# Patient Record
Sex: Female | Born: 1940
Health system: Southern US, Community
[De-identification: ages and names within clinical notes are randomized; demographics above are authoritative.]

## PROBLEM LIST (undated history)

## (undated) DIAGNOSIS — F331 Major depressive disorder, recurrent, moderate: Secondary | ICD-10-CM

## (undated) DIAGNOSIS — M3390 Dermatopolymyositis, unspecified, organ involvement unspecified: Secondary | ICD-10-CM

## (undated) DIAGNOSIS — M810 Age-related osteoporosis without current pathological fracture: Secondary | ICD-10-CM

## (undated) DIAGNOSIS — K862 Cyst of pancreas: Secondary | ICD-10-CM

## (undated) DIAGNOSIS — K52839 Microscopic colitis, unspecified: Secondary | ICD-10-CM

## (undated) DIAGNOSIS — M1990 Unspecified osteoarthritis, unspecified site: Secondary | ICD-10-CM

## (undated) DIAGNOSIS — N183 Chronic kidney disease, stage 3 unspecified: Secondary | ICD-10-CM

## (undated) DIAGNOSIS — I2510 Atherosclerotic heart disease of native coronary artery without angina pectoris: Secondary | ICD-10-CM

## (undated) DIAGNOSIS — E119 Type 2 diabetes mellitus without complications: Secondary | ICD-10-CM

## (undated) DIAGNOSIS — S0181XA Laceration without foreign body of other part of head, initial encounter: Secondary | ICD-10-CM

## (undated) DIAGNOSIS — R32 Unspecified urinary incontinence: Secondary | ICD-10-CM

## (undated) DIAGNOSIS — G458 Other transient cerebral ischemic attacks and related syndromes: Secondary | ICD-10-CM

## (undated) DIAGNOSIS — R7303 Prediabetes: Secondary | ICD-10-CM

## (undated) DIAGNOSIS — Z8619 Personal history of other infectious and parasitic diseases: Secondary | ICD-10-CM

## (undated) DIAGNOSIS — E785 Hyperlipidemia, unspecified: Secondary | ICD-10-CM

## (undated) DIAGNOSIS — Z8669 Personal history of other diseases of the nervous system and sense organs: Secondary | ICD-10-CM

## (undated) DIAGNOSIS — I6529 Occlusion and stenosis of unspecified carotid artery: Secondary | ICD-10-CM

## (undated) DIAGNOSIS — E039 Hypothyroidism, unspecified: Secondary | ICD-10-CM

## (undated) DIAGNOSIS — M3313 Other dermatomyositis without myopathy: Secondary | ICD-10-CM

## (undated) HISTORY — DX: Personal history of other infectious and parasitic diseases: Z86.19

## (undated) HISTORY — DX: Chronic kidney disease, stage 3 (moderate): N18.3

## (undated) HISTORY — DX: Unspecified urinary incontinence: R32

## (undated) HISTORY — DX: Occlusion and stenosis of unspecified carotid artery: I65.29

## (undated) HISTORY — DX: Atherosclerotic heart disease of native coronary artery without angina pectoris: I25.10

## (undated) HISTORY — DX: Laceration without foreign body of other part of head, initial encounter: S01.81XA

## (undated) HISTORY — DX: Other dermatomyositis without myopathy: M33.13

## (undated) HISTORY — DX: Prediabetes: R73.03

## (undated) HISTORY — PX: TOOTH EXTRACTION: SUR596

## (undated) HISTORY — DX: Cyst of pancreas: K86.2

## (undated) HISTORY — DX: Dermatopolymyositis, unspecified, organ involvement unspecified: M33.90

## (undated) HISTORY — DX: Type 2 diabetes mellitus without complications: E11.9

## (undated) HISTORY — DX: Personal history of other diseases of the nervous system and sense organs: Z86.69

## (undated) HISTORY — DX: Other transient cerebral ischemic attacks and related syndromes: G45.8

## (undated) HISTORY — DX: Major depressive disorder, recurrent, moderate: F33.1

## (undated) HISTORY — DX: Hyperlipidemia, unspecified: E78.5

## (undated) HISTORY — DX: Chronic kidney disease, stage 3 unspecified: N18.30

## (undated) HISTORY — DX: Microscopic colitis, unspecified: K52.839

---

## 1975-04-15 HISTORY — PX: BREAST LUMPECTOMY: SHX2

## 1979-04-15 HISTORY — PX: BACK SURGERY: SHX140

## 1988-12-13 HISTORY — PX: ROTATOR CUFF REPAIR: SHX139

## 1995-04-15 HISTORY — PX: TOTAL ABDOMINAL HYSTERECTOMY W/ BILATERAL SALPINGOOPHORECTOMY: SHX83

## 1995-04-15 HISTORY — PX: APPENDECTOMY: SHX54

## 1999-03-04 ENCOUNTER — Encounter: Admission: RE | Admit: 1999-03-04 | Discharge: 1999-03-04 | Payer: Self-pay | Admitting: *Deleted

## 1999-03-21 ENCOUNTER — Other Ambulatory Visit: Admission: RE | Admit: 1999-03-21 | Discharge: 1999-03-21 | Payer: Self-pay | Admitting: *Deleted

## 2000-03-04 ENCOUNTER — Encounter: Admission: RE | Admit: 2000-03-04 | Discharge: 2000-03-04 | Payer: Self-pay | Admitting: *Deleted

## 2000-03-04 ENCOUNTER — Encounter: Payer: Self-pay | Admitting: *Deleted

## 2000-03-23 ENCOUNTER — Other Ambulatory Visit: Admission: RE | Admit: 2000-03-23 | Discharge: 2000-03-23 | Payer: Self-pay | Admitting: *Deleted

## 2001-03-05 ENCOUNTER — Encounter: Admission: RE | Admit: 2001-03-05 | Discharge: 2001-03-05 | Payer: Self-pay | Admitting: *Deleted

## 2001-03-05 ENCOUNTER — Encounter: Payer: Self-pay | Admitting: *Deleted

## 2001-03-15 ENCOUNTER — Other Ambulatory Visit: Admission: RE | Admit: 2001-03-15 | Discharge: 2001-03-15 | Payer: Self-pay | Admitting: *Deleted

## 2001-04-14 DIAGNOSIS — I2510 Atherosclerotic heart disease of native coronary artery without angina pectoris: Secondary | ICD-10-CM

## 2001-04-14 DIAGNOSIS — I219 Acute myocardial infarction, unspecified: Secondary | ICD-10-CM

## 2001-04-14 HISTORY — DX: Acute myocardial infarction, unspecified: I21.9

## 2001-04-14 HISTORY — PX: CORONARY ARTERY BYPASS GRAFT: SHX141

## 2001-04-14 HISTORY — DX: Atherosclerotic heart disease of native coronary artery without angina pectoris: I25.10

## 2002-03-07 ENCOUNTER — Encounter: Payer: Self-pay | Admitting: *Deleted

## 2002-03-07 ENCOUNTER — Encounter: Admission: RE | Admit: 2002-03-07 | Discharge: 2002-03-07 | Payer: Self-pay | Admitting: *Deleted

## 2002-03-15 ENCOUNTER — Encounter: Payer: Self-pay | Admitting: Thoracic Surgery (Cardiothoracic Vascular Surgery)

## 2002-03-15 ENCOUNTER — Other Ambulatory Visit: Admission: RE | Admit: 2002-03-15 | Discharge: 2002-03-15 | Payer: Self-pay | Admitting: *Deleted

## 2002-03-17 ENCOUNTER — Encounter: Payer: Self-pay | Admitting: Thoracic Surgery (Cardiothoracic Vascular Surgery)

## 2002-03-17 ENCOUNTER — Inpatient Hospital Stay (HOSPITAL_COMMUNITY)
Admission: RE | Admit: 2002-03-17 | Discharge: 2002-03-21 | Payer: Self-pay | Admitting: Thoracic Surgery (Cardiothoracic Vascular Surgery)

## 2002-03-18 ENCOUNTER — Encounter: Payer: Self-pay | Admitting: Thoracic Surgery (Cardiothoracic Vascular Surgery)

## 2002-03-20 ENCOUNTER — Encounter: Payer: Self-pay | Admitting: Thoracic Surgery (Cardiothoracic Vascular Surgery)

## 2002-05-02 ENCOUNTER — Encounter (HOSPITAL_COMMUNITY): Admission: RE | Admit: 2002-05-02 | Discharge: 2002-07-31 | Payer: Self-pay | Admitting: Cardiology

## 2002-05-16 ENCOUNTER — Encounter
Admission: RE | Admit: 2002-05-16 | Discharge: 2002-05-16 | Payer: Self-pay | Admitting: Thoracic Surgery (Cardiothoracic Vascular Surgery)

## 2002-05-16 ENCOUNTER — Encounter: Payer: Self-pay | Admitting: Thoracic Surgery (Cardiothoracic Vascular Surgery)

## 2003-03-21 ENCOUNTER — Other Ambulatory Visit: Admission: RE | Admit: 2003-03-21 | Discharge: 2003-03-21 | Payer: Self-pay | Admitting: *Deleted

## 2003-05-05 ENCOUNTER — Encounter: Admission: RE | Admit: 2003-05-05 | Discharge: 2003-05-05 | Payer: Self-pay | Admitting: Internal Medicine

## 2003-07-25 ENCOUNTER — Emergency Department (HOSPITAL_COMMUNITY): Admission: EM | Admit: 2003-07-25 | Discharge: 2003-07-25 | Payer: Self-pay | Admitting: Emergency Medicine

## 2004-03-21 ENCOUNTER — Other Ambulatory Visit: Admission: RE | Admit: 2004-03-21 | Discharge: 2004-03-21 | Payer: Self-pay | Admitting: *Deleted

## 2004-12-03 ENCOUNTER — Encounter: Admission: RE | Admit: 2004-12-03 | Discharge: 2004-12-03 | Payer: Self-pay | Admitting: Orthopedic Surgery

## 2005-03-18 ENCOUNTER — Other Ambulatory Visit: Admission: RE | Admit: 2005-03-18 | Discharge: 2005-03-18 | Payer: Self-pay | Admitting: *Deleted

## 2006-03-25 ENCOUNTER — Other Ambulatory Visit: Admission: RE | Admit: 2006-03-25 | Discharge: 2006-03-25 | Payer: Self-pay | Admitting: *Deleted

## 2006-05-26 ENCOUNTER — Emergency Department (HOSPITAL_COMMUNITY): Admission: EM | Admit: 2006-05-26 | Discharge: 2006-05-26 | Payer: Self-pay | Admitting: Emergency Medicine

## 2007-03-25 ENCOUNTER — Emergency Department (HOSPITAL_COMMUNITY): Admission: EM | Admit: 2007-03-25 | Discharge: 2007-03-26 | Payer: Self-pay | Admitting: Emergency Medicine

## 2007-12-22 ENCOUNTER — Other Ambulatory Visit: Admission: RE | Admit: 2007-12-22 | Discharge: 2007-12-22 | Payer: Self-pay | Admitting: Gynecology

## 2009-05-02 ENCOUNTER — Ambulatory Visit (HOSPITAL_COMMUNITY): Admission: RE | Admit: 2009-05-02 | Discharge: 2009-05-02 | Payer: Self-pay | Admitting: Rheumatology

## 2009-05-04 ENCOUNTER — Ambulatory Visit (HOSPITAL_COMMUNITY): Admission: RE | Admit: 2009-05-04 | Discharge: 2009-05-04 | Payer: Self-pay | Admitting: Rheumatology

## 2009-05-10 ENCOUNTER — Ambulatory Visit: Payer: Self-pay | Admitting: Oncology

## 2009-05-11 LAB — COMPREHENSIVE METABOLIC PANEL
ALT: 30 U/L (ref 0–35)
AST: 37 U/L (ref 0–37)
Albumin: 3.7 g/dL (ref 3.5–5.2)
Alkaline Phosphatase: 75 U/L (ref 39–117)
BUN: 9 mg/dL (ref 6–23)
CO2: 27 mEq/L (ref 19–32)
Creatinine, Ser: 1.02 mg/dL (ref 0.40–1.20)
Total Protein: 6.3 g/dL (ref 6.0–8.3)

## 2009-05-11 LAB — LACTATE DEHYDROGENASE: LDH: 161 U/L (ref 94–250)

## 2009-05-11 LAB — CBC WITH DIFFERENTIAL/PLATELET
Basophils Absolute: 0 10*3/uL (ref 0.0–0.1)
EOS%: 2 % (ref 0.0–7.0)
HCT: 38.2 % (ref 34.8–46.6)
MCH: 31.7 pg (ref 25.1–34.0)
MCHC: 34.2 g/dL (ref 31.5–36.0)
MCV: 92.7 fL (ref 79.5–101.0)
MONO#: 0.5 10*3/uL (ref 0.1–0.9)
MONO%: 9.7 % (ref 0.0–14.0)
NEUT%: 54.3 % (ref 38.4–76.8)
Platelets: 168 10*3/uL (ref 145–400)
RBC: 4.12 10*6/uL (ref 3.70–5.45)

## 2009-05-11 LAB — CEA: CEA: 1.1 ng/mL (ref 0.0–5.0)

## 2009-05-11 LAB — CANCER ANTIGEN 19-9: CA 19-9: 10.4 U/mL (ref <35.0)

## 2009-05-23 ENCOUNTER — Encounter: Admission: RE | Admit: 2009-05-23 | Discharge: 2009-05-23 | Payer: Self-pay | Admitting: Oncology

## 2009-08-29 ENCOUNTER — Ambulatory Visit: Payer: Self-pay | Admitting: Oncology

## 2009-08-30 ENCOUNTER — Ambulatory Visit (HOSPITAL_COMMUNITY): Admission: RE | Admit: 2009-08-30 | Discharge: 2009-08-30 | Payer: Self-pay | Admitting: Oncology

## 2009-08-30 LAB — COMPREHENSIVE METABOLIC PANEL
AST: 26 U/L (ref 0–37)
Albumin: 3.9 g/dL (ref 3.5–5.2)
BUN: 11 mg/dL (ref 6–23)
Calcium: 9.4 mg/dL (ref 8.4–10.5)
Creatinine, Ser: 1.03 mg/dL (ref 0.40–1.20)
Sodium: 136 mEq/L (ref 135–145)

## 2010-02-27 ENCOUNTER — Ambulatory Visit: Payer: Self-pay | Admitting: Oncology

## 2010-03-01 LAB — COMPREHENSIVE METABOLIC PANEL
ALT: 15 U/L (ref 0–35)
Albumin: 4.2 g/dL (ref 3.5–5.2)
Alkaline Phosphatase: 77 U/L (ref 39–117)
BUN: 13 mg/dL (ref 6–23)
Chloride: 104 mEq/L (ref 96–112)
Glucose, Bld: 88 mg/dL (ref 70–99)
Sodium: 136 mEq/L (ref 135–145)
Total Bilirubin: 0.6 mg/dL (ref 0.3–1.2)
Total Protein: 6.6 g/dL (ref 6.0–8.3)

## 2010-03-01 LAB — CBC WITH DIFFERENTIAL/PLATELET
Basophils Absolute: 0 10*3/uL (ref 0.0–0.1)
EOS%: 2.8 % (ref 0.0–7.0)
Eosinophils Absolute: 0.2 10*3/uL (ref 0.0–0.5)
HCT: 37.9 % (ref 34.8–46.6)
MCHC: 33.8 g/dL (ref 31.5–36.0)
MCV: 92.4 fL (ref 79.5–101.0)
Platelets: 203 10*3/uL (ref 145–400)
RDW: 13.1 % (ref 11.2–14.5)
WBC: 5.5 10*3/uL (ref 3.9–10.3)
lymph#: 1.9 10*3/uL (ref 0.9–3.3)

## 2010-05-04 ENCOUNTER — Other Ambulatory Visit (HOSPITAL_COMMUNITY): Payer: Self-pay | Admitting: Oncology

## 2010-05-04 DIAGNOSIS — K869 Disease of pancreas, unspecified: Secondary | ICD-10-CM

## 2010-08-26 ENCOUNTER — Other Ambulatory Visit (HOSPITAL_COMMUNITY): Payer: Self-pay | Admitting: Oncology

## 2010-08-26 ENCOUNTER — Encounter (HOSPITAL_BASED_OUTPATIENT_CLINIC_OR_DEPARTMENT_OTHER): Payer: Medicare Other | Admitting: Oncology

## 2010-08-26 ENCOUNTER — Ambulatory Visit (HOSPITAL_COMMUNITY)
Admission: RE | Admit: 2010-08-26 | Discharge: 2010-08-26 | Disposition: A | Discharge status: Home / Self Care | Payer: Medicare Other | Source: Ambulatory Visit | Attending: Oncology | Admitting: Oncology

## 2010-08-26 DIAGNOSIS — K869 Disease of pancreas, unspecified: Secondary | ICD-10-CM

## 2010-08-26 DIAGNOSIS — Z9089 Acquired absence of other organs: Secondary | ICD-10-CM | POA: Insufficient documentation

## 2010-08-26 DIAGNOSIS — K8689 Other specified diseases of pancreas: Secondary | ICD-10-CM | POA: Insufficient documentation

## 2010-08-26 DIAGNOSIS — Z90710 Acquired absence of both cervix and uterus: Secondary | ICD-10-CM | POA: Insufficient documentation

## 2010-08-26 DIAGNOSIS — B358 Other dermatophytoses: Secondary | ICD-10-CM

## 2010-08-26 LAB — CBC WITH DIFFERENTIAL/PLATELET
BASO%: 0.5 % (ref 0.0–2.0)
Basophils Absolute: 0 10*3/uL (ref 0.0–0.1)
LYMPH%: 26.3 % (ref 14.0–49.7)
MONO%: 7.8 % (ref 0.0–14.0)
NEUT#: 3.6 10*3/uL (ref 1.5–6.5)
NEUT%: 61.8 % (ref 38.4–76.8)
RDW: 14.2 % (ref 11.2–14.5)
lymph#: 1.5 10*3/uL (ref 0.9–3.3)

## 2010-08-26 LAB — CMP (CANCER CENTER ONLY)
ALT(SGPT): 35 U/L (ref 10–47)
AST: 49 U/L — ABNORMAL HIGH (ref 11–38)
Albumin: 4 g/dL (ref 3.3–5.5)
Calcium: 9.3 mg/dL (ref 8.0–10.3)
Creat: 1 mg/dl (ref 0.6–1.2)
Glucose, Bld: 129 mg/dL — ABNORMAL HIGH (ref 73–118)
Potassium: 5.4 mEq/L — ABNORMAL HIGH (ref 3.3–4.7)
Total Protein: 6.6 g/dL (ref 6.4–8.1)

## 2010-08-26 LAB — LACTATE DEHYDROGENASE: LDH: 209 U/L (ref 94–250)

## 2010-08-26 MED ORDER — IOHEXOL 300 MG/ML  SOLN
125.0000 mL | Freq: Once | INTRAMUSCULAR | Status: AC | PRN
  Administered 2010-08-26: 125 mL via INTRAVENOUS

## 2010-08-30 ENCOUNTER — Encounter (HOSPITAL_BASED_OUTPATIENT_CLINIC_OR_DEPARTMENT_OTHER): Payer: Medicare Other | Admitting: Oncology

## 2010-08-30 DIAGNOSIS — B358 Other dermatophytoses: Secondary | ICD-10-CM

## 2010-08-30 DIAGNOSIS — K869 Disease of pancreas, unspecified: Secondary | ICD-10-CM

## 2010-08-30 NOTE — Op Note (Signed)
Kathleen Williamson, Kathleen Williamson                          ACCOUNT NO.:  [id number]   MEDICAL RECORD NO.:  [id number]                   PATIENT TYPE:  INP   LOCATION:  2301                                 FACILITY:  [facility]   PHYSICIAN:  Salvatore Decent. Cornelius Moras, M.D.              DATE OF BIRTH:  12-05-1940   DATE OF PROCEDURE:  03/17/2002  DATE OF DISCHARGE:                                 OPERATIVE REPORT   PREOPERATIVE DIAGNOSIS:  Severe three-vessel coronary artery disease with  new onset angina.   POSTOPERATIVE DIAGNOSIS:  Severe three-vessel coronary artery disease with  new onset angina.   OPERATION PERFORMED:  Median sternotomy for coronary artery bypass grafting  times five (left internal mammary artery to distal left anterior descending  coronary artery, free right internal mammary artery to posterior descending  coronary artery, saphenous vein graft to first diagonal branch, saphenous  vein graft to second circumflex marginal branch and sequential saphenous  vein graft to fit circumflex marginal branch).   SURGEON:  Salvatore Decent. Cornelius Moras, M.D.   ASSISTANT:  1. Kerin Perna, M.D.  2. Levin Erp. Steward, P.A.   ANESTHESIA:  General.   INDICATIONS FOR PROCEDURE:  The patient is a 70 year old female with no  previous cardiac history but risk factors including history of  hyperlipidemia.  The patient is followed by Dr. Benjaman Kindler and referred by  Dr. Corliss Marcus for management of coronary artery disease.  The patient  presents with new onset symptoms of angina.  Catheterization performed by  Dr. Amil Amen demonstrates severe two to three-vessel coronary artery disease  with high grade stenosis of the left anterior descending coronary artery not  amenable to percutaneous based therapy.  Full consultation note has been  dictated previously.   OPERATIVE CONSENT:  The patient and her husband have been counseled a length  regarding the indications and potential benefits of coronary artery bypass  grafting.  Alternative treatment strategies have been discussed.  They  understand and accept all associated risks of surgery including but not  limited to risks of death, stroke, myocardial infarction, respiratory  failure, bleeding requiring blood transfusion, arrhythmia, infection,  recurrent coronary artery disease.  All of their questions have been  addressed.   DESCRIPTION OF PROCEDURE:  The patient was brought to the operating room on  the above mentioned date and identified and placed in supine position on the  operating table.  Central monitoring was established by the anesthesia  service under the care and direction of Dr. Kipp Brood.  The intravenous  antibiotics were administered.  Following induction of general endotracheal  anesthesia, a Foley catheter was placed.  The patient's chest, abdomen, both  groins, and both lower extremities were prepped and draped in a sterile  manner.   A median sternotomy incision was performed and the left internal mammary  artery was dissected from the chest wall and prepared for bypass grafting.  The left internal mammary artery was somewhat small caliber but otherwise  excellent quality with excellent forward flow.  Subsequently, the right  internal mammary artery was also dissected from the chest wall and prepared  for bypass grafting.  This vessel was similarly a good quality conduit  although relatively small caliber.  Simultaneously, saphenous vein was  obtained from the patients left thigh using endoscopic vein harvest  technique.  An initial incision was made in the right thigh just above the  knee, but the saphenous vein at this level appears somewhat small.  Following this, an incision was made in the left knee and the vein seemed to  be of better size.  This vein was harvested using endoscopic technique.  The  patient was heparinized systemically.   The pericardium was opened.  The ascending aorta was normal in appearance.  The  ascending aorta and the right atrium were cannulated for cardiopulmonary  bypass.  Adequate heparinization was verified.  Cardiopulmonary bypass was  begun and the surface of the heart was inspected.  Distal sites were  selected for coronary artery bypass grafting.  Of note, the right internal  mammary artery will not reach to the level necessary for grafting of the  proximal portion of the posterior descending coronary artery as an in situ  graft.  Therefore, the right internal mammary artery was transected  proximally near its takeoff from the subclavian artery and the proximal  stump was doubly oversewn with suture ligatures.  Portions of saphenous vein  and the left internal mammary artery are also trimmed to appropriate length  for bypass grafting.  A temperature probe was placed in the left ventricular  septum and a cardioplegia catheter was placed in the ascending aorta.   The patient was cooled to 30 degrees systemic temperature.  The aortic  crossclamp was applied and cardioplegia was delivered initially in an  antegrade fashion through the aortic root.  Iced saline slush was applied  for topical hypothermia.  The initial cardioplegic arrest and myocardial  cooling were notably excellent.  Repeat doses of cardioplegia were  administered intermittently throughout the crossclamp portion of the  operation, both through the aortic root and down a subsequently vein graft  to maintain septal temperature below 15 degrees centigrade.  The following  distal coronary anastomoses were performed:  (1)  The second circumflex  marginal branch was grafted with a saphenous vein graft in a side-to-side  fashion.  This coronary measures 1.5 mm in diameter and is of good quality.  (2)  The fifth circumflex marginal branch of the distal left circumflex  artery was grafted using a sequential saphenous vein graft following the  vein placed to the second circumflex marginal branch.  This  coronary measures  1.1 mm in diameter at the site of distal bypass and is of good quality.  (3)  The first diagonal branch off the left anterior descending coronary artery  was grafted with a saphenous vein graft in an end-to-side fashion.  This  coronary measures 1.4 mm in diameter and is of good quality at the site of  distal bypass.  (4)  The posterior descending coronary artery is grafted  with the right internal mammary artery as a free graft in an end-to-side  fashion.  This coronary measures 1.3 mm in diameter at the site of distal  bypass and is of good quality.  (5)  The distal left anterior descending  coronary artery was grafted with the left internal mammary artery  in end-to-  side fashion.  This coronary measures 1.5 mm at the site of distal bypass  and is of good quality.  There is diffuse plaque in the vessel beginning  immediately proximal to the level of the distal anastomosis.  The vessel was  of good quality at the site of distal anastomosis.  Free proximal  anastomoses are performed directly to the ascending aorta prior to removal  of the aortic crossclamp.  The proximal anastomosis for the free right  internal mammary artery graft was constructed using a very short segment of  interposition saphenous vein due to mismatch in caliber between the proximal  end of the right internal mammary artery and the ascending aorta itself.  The septal temperature was noted to rise rapidly and dramatically upon  reperfusion of the left internal mammary artery.  The aortic crossclamp was  removed after a total crossclamp time of 82 minutes.   The heart began to beat spontaneously without need for cardioversion.  The  proximal end of the free right internal mammary artery graft is now  constructed in an end-to-end fashion to the short segment of saphenous vein  as described previously.  This is performed with a 7-0 suture.  All proximal  and distal anastomoses were inspected for  hemostasis and appropriate graft  orientation.  Epicardial pacing wires were fixed to the right ventricular  outflow tract and to the right atrial appendage.  The patient was rewarmed  to greater than 37 degrees centigrade temperature.  The patient was weaned  from cardiopulmonary bypass without difficulty.  The patient's rhythm at  separation from bypass was normal sinus rhythm.  No inotropic support was  required.  Total cardiopulmonary bypass time for the operation was 119  minutes.   The venous and arterial cannulae were removed uneventfully.  Protamine was  administered to reverse the anticoagulation.  The mediastinum and both left  and right pleural spaces were irrigated with saline solution and inspected  for hemostasis.  The mediastinum and both left and right pleural spaces were  then drained using four chest tubes placed through separate stab incisions  inferiorly.  The median sternotomy was closed in routine fashion.  Both lower extremity incisions were closed in multiple layers in routine fashion.  All skin incisions were closed with subcuticular skin closures.   The patient tolerated the procedure well and was transported to the surgical  intensive care unit in stable condition.  There were no intraoperative  complications.  All sponge and instrument counts were verified correct at  completion of the operation.  No blood products were administered.                                                Salvatore Decent. Cornelius Moras, M.D.    CHO/MEDQ  D:  03/17/2002  T:  03/17/2002  Job:  [id number]   cc:   Francisca December, M.D.  [address]  [address]  [place]  [place] [address]  Fax: [phone]   Theressa Millard, M.D.  [address] [place]  [place] [address]  Fax: [phone]

## 2010-08-30 NOTE — Discharge Summary (Signed)
Kathleen Williamson, Kathleen Williamson                          ACCOUNT NO.:  [id number]   MEDICAL RECORD NO.:  [id number]                   PATIENT TYPE:  INP   LOCATION:  2011                                 FACILITY:  [facility]   PHYSICIAN:  Salvatore Decent. Cornelius Moras, M.D.              DATE OF BIRTH:  1940-09-28   DATE OF ADMISSION:  03/17/2002  DATE OF DISCHARGE:  03/21/2002                                 DISCHARGE SUMMARY   PRIMARY ADMISSION DIAGNOSES:  1. Severe three-vessel coronary artery disease.  2. New-onset angina.   ADDITIONAL DIAGNOSES/DISCHARGE DIAGNOSES:  1. Severe three-vessel coronary artery disease.  2. New-onset angina.  3. Hyperlipidemia.  4. Hypothyroidism.  5. Depression.   PROCEDURES:  1. Coronary artery bypass grafting x 5 (left internal mammary artery to the     LAD, free right internal mammary artery to the posterior descending,     saphenous vein graft to the diagonal, sequential saphenous vein graft to     the second and fifth circumflex marginals).  2. Endoscopic vein harvest left thigh.   HISTORY OF PRESENT ILLNESS:  The patient is a 70 year old white female with  a previous history of coronary artery disease.  Over the past 1-1/2 to two  weeks she has been experiencing intermittent chest pressure with exertion  associated with palpitations and radiation to her neck and shoulder.  She  was seen by Dr. Amil Amen and underwent an exercise treadmill test which were  notable for anginal type symptoms relieved with rest and correlating with  EKG changes.  She underwent elective cardiac catheterization on March 08, 2002, and was found to have severe two- to three-vessel coronary artery  disease which was not felt to be amenable to percutaneous intervention.  She  was referred to Dr. Tressie Stalker for cardiac surgical evaluation and was  felt to be a good candidate for surgical revascularization.   HOSPITAL COURSE:  She was admitted to [facility] on March 17, 2002, and taken to the operating room where she underwent CABG x 5 with the  above-noted grafts.  She tolerated the procedure well and was transferred to  the SICU in stable condition.  She was extubated shortly after surgery.  She  was hemodynamically stable and doing well on postoperative day #1.  At that  time she was able to be transferred to the floor.  Her mediastinal chest  tubes were left in place due to increased drainage, and by postoperative day  #3 drainage had minimized significantly.  At this time the tubes were  discontinued.  Otherwise, she has done well postoperatively.  She has been  mildly anemic, but her hemoglobin and hematocrit have remained stable,  around 9.8 and 29.4 respectively.  Her other laboratories have also remained  stable.  She has been diuresed back down to a few pounds above her  preoperative weight.  She is ambulating in the halls  without difficulty and  has been weaned off supplemental oxygen, maintaining O2 saturations of  around 96-98% on room air.  Her incisions are all healing well.  It is felt  that if she continues to remain stable she will be ready for discharge home  on March 21, 2002.   DISCHARGE MEDICATIONS:  1. Enteric-coated aspirin 325 mg q.d.  2. Protonix 40 mg q.d.  3. Toprol XL 25 mg q.d.  4. Ultram 50-100 mg q.4h. p.r.n. for pain.  5. Zoloft 100 mg q.d.  6. Synthroid 50 mcg q.d.   DISCHARGE INSTRUCTIONS:  She is to refrain from driving, heavy lifting, or  straining with activity.  She may continue daily walking and use of  incentive spirometer.  She is asked to continue a low-fat, low-salt diet.  She may shower daily and clean her incisions with soap and water.   DISCHARGE FOLLOW-UP:  She will see Dr. Amil Amen in the office in two weeks  and have a chest x-ray at that time.  She will then follow up with Dr. Cornelius Moras  in three weeks, and she will bring her chest x-ray to this appointment.     Coral Ceo, P.A.                         Salvatore Decent. Cornelius Moras, M.D.    GC/MEDQ  D:  03/20/2002  T:  03/21/2002  Job:  [id number]   cc:   Francisca December, M.D.  [address]  [address]  [place]  [place] [address]  Fax: [phone]   Theressa Millard, M.D.  [address] [place]  [place] [address]  Fax: [phone]

## 2011-04-11 ENCOUNTER — Telehealth: Payer: Self-pay | Admitting: Oncology

## 2011-04-11 ENCOUNTER — Other Ambulatory Visit (HOSPITAL_COMMUNITY): Payer: Self-pay | Admitting: Oncology

## 2011-04-11 DIAGNOSIS — C259 Malignant neoplasm of pancreas, unspecified: Secondary | ICD-10-CM

## 2011-04-11 NOTE — Telephone Encounter (Signed)
lmonvm adviising the pt of her lab and ct scan appt along with the instructions and the appt with the md. Pt is aware to pick up the oral contrast prior to the appt.

## 2011-08-08 ENCOUNTER — Telehealth: Payer: Self-pay | Admitting: Oncology

## 2011-08-08 NOTE — Telephone Encounter (Signed)
Changed time of 5/17 appt to 12 pm. S/w pt she is aware.

## 2011-08-26 ENCOUNTER — Ambulatory Visit (HOSPITAL_COMMUNITY)
Admission: RE | Admit: 2011-08-26 | Discharge: 2011-08-26 | Disposition: A | Discharge status: Home / Self Care | Payer: Medicare Other | Source: Ambulatory Visit | Attending: Oncology | Admitting: Oncology

## 2011-08-26 ENCOUNTER — Other Ambulatory Visit (HOSPITAL_BASED_OUTPATIENT_CLINIC_OR_DEPARTMENT_OTHER): Payer: Medicare Other | Admitting: Lab

## 2011-08-26 DIAGNOSIS — C259 Malignant neoplasm of pancreas, unspecified: Secondary | ICD-10-CM

## 2011-08-26 DIAGNOSIS — I700 Atherosclerosis of aorta: Secondary | ICD-10-CM | POA: Insufficient documentation

## 2011-08-26 DIAGNOSIS — K862 Cyst of pancreas: Secondary | ICD-10-CM | POA: Insufficient documentation

## 2011-08-26 DIAGNOSIS — K863 Pseudocyst of pancreas: Secondary | ICD-10-CM | POA: Insufficient documentation

## 2011-08-26 DIAGNOSIS — K5710 Diverticulosis of small intestine without perforation or abscess without bleeding: Secondary | ICD-10-CM | POA: Insufficient documentation

## 2011-08-26 DIAGNOSIS — K869 Disease of pancreas, unspecified: Secondary | ICD-10-CM

## 2011-08-26 LAB — CMP (CANCER CENTER ONLY)
Albumin: 3.8 g/dL (ref 3.3–5.5)
BUN, Bld: 10 mg/dL (ref 7–22)
CO2: 26 mEq/L (ref 18–33)
Calcium: 8.9 mg/dL (ref 8.0–10.3)
Chloride: 98 mEq/L (ref 98–108)
Glucose, Bld: 113 mg/dL (ref 73–118)
Potassium: 4.4 mEq/L (ref 3.3–4.7)
Sodium: 139 mEq/L (ref 128–145)
Total Bilirubin: 0.7 mg/dl (ref 0.20–1.60)
Total Protein: 6.8 g/dL (ref 6.4–8.1)

## 2011-08-26 LAB — CBC WITH DIFFERENTIAL/PLATELET
HGB: 12 g/dL (ref 11.6–15.9)
MONO%: 6 % (ref 0.0–14.0)
NEUT#: 3.9 10*3/uL (ref 1.5–6.5)
Platelets: 165 10*3/uL (ref 145–400)
WBC: 6.4 10*3/uL (ref 3.9–10.3)

## 2011-08-26 LAB — LACTATE DEHYDROGENASE: LDH: 194 U/L (ref 94–250)

## 2011-08-26 MED ORDER — IOHEXOL 300 MG/ML  SOLN
100.0000 mL | Freq: Once | INTRAMUSCULAR | Status: AC | PRN
  Administered 2011-08-26: 100 mL via INTRAVENOUS

## 2011-08-26 NOTE — Progress Notes (Signed)
Quick Note:  Please notify patient and call/fax these results to patient's doctors. ______ 

## 2011-08-28 ENCOUNTER — Telehealth: Payer: Self-pay | Admitting: Medical Oncology

## 2011-08-28 NOTE — Telephone Encounter (Signed)
I called pt per Dr. Arline Asp with CT results from 08/26/11

## 2011-08-29 ENCOUNTER — Encounter: Payer: Self-pay | Admitting: Oncology

## 2011-08-29 ENCOUNTER — Ambulatory Visit (HOSPITAL_BASED_OUTPATIENT_CLINIC_OR_DEPARTMENT_OTHER): Payer: Medicare Other | Admitting: Oncology

## 2011-08-29 VITALS — BP 116/60 | HR 59 | Temp 97.1°F | Ht 64.0 in | Wt 131.9 lb

## 2011-08-29 DIAGNOSIS — K863 Pseudocyst of pancreas: Secondary | ICD-10-CM | POA: Insufficient documentation

## 2011-08-29 DIAGNOSIS — R932 Abnormal findings on diagnostic imaging of liver and biliary tract: Secondary | ICD-10-CM

## 2011-08-29 DIAGNOSIS — I2510 Atherosclerotic heart disease of native coronary artery without angina pectoris: Secondary | ICD-10-CM

## 2011-08-29 NOTE — Progress Notes (Signed)
CC:   Pollyann Savoy, M.D. Theressa Millard, M.D. Daniel B. Yetta Barre, M.D.  PROBLEM LIST: 1. Dermatomyositis with symptoms originating in the spring of 2010,     and skin biopsy carried out in November of 2010.  Apparently this     problem has resolved. 2. Abnormal CT scan of pancreas showing small cystic lesions in the     pancreatic head felt to be benign on serial observation. 3. Coronary artery disease status post coronary artery bypass graft x5     in December 2003. 4. Hypothyroidism. 5. Dyslipidemia. 6. Arthritis involving the fingers. 7. History of depression.  MEDICATIONS:  Medicines are listed in epic and are reviewed and recorded. 1. Prescription medicines include levothyroxine. 2. Toprol-XL. 3. Niaspan. 4. Pravastatin. 5. Zoloft. 6. Restasis. 7. The patient is also on aspirin 325 mg daily.  HISTORY:  Kathleen Williamson was seen today for followup of her history of dermatomyositis and an abnormal CT and MRI of the pancreas which showed a couple of cystic lesions in the pancreatic head felt to be pseudocysts.  Kathleen Williamson is here today with her husband, Annette Stable.  She was last seen by [name] a year ago.  Over the past year she apparently has been having intermittent loose stools which occur up to 4 times a day.  Sometimes stools are formed.  The patient may take 2-4 Imodium per week.  She also has some nausea and occasional vomiting, less than 1 time per month. She denies any abdominal pain or sense of ill health.  Energy is good. No blood in the stools.  She has however lost about 9 or 10 pounds in weight over the past year.  Appetite appears to be fairly good.  The patient tells me that her dermatomyositis apparently has resolved.  PHYSICAL EXAMINATION:  The patient looks generally well.  Weight is 132 pounds, height 5 feet 4 inches, body surface area 1.64 meters squared. Blood pressure 116/50.  Other vital signs are normal.  There is no scleral icterus.  Mouth and pharynx are  benign.  No peripheral adenopathy palpable.  Heart and lungs are normal.  She has had cardiac surgery with a well-healed scar.  Abdomen is benign with no organomegaly or masses palpable.  Extremities no peripheral edema or clubbing.  No obvious rashes.  Neurologic exam is normal.  LABORATORY DATA:  From 08/26/2011 white count 6.4, ANC 3.9, hemoglobin 12.0, hematocrit 36.2, platelets 165,000.  Chemistries from 08/26/2011 notable for an AST of 48.  On 08/26/2010 AST had been 49.  However, on 03/01/2010 AST had been 22.  Rest of the chemistries are normal.  IMAGING STUDIES: 1. CT scan of abdomen and pelvis with IV contrast on 05/04/2009 showed     a small unilocular cyst within the head of the pancreas felt to be     a benign finding, possibly related to prior pancreatitis.  There     were no acute abdominal or pelvic findings.  MRI was suggested. 2. MRI of the abdomen with and without IV contrast from 05/23/2009     showed a 6 x 8 mm cyst in the pancreatic head, again felt to be a     benign lesion.  Possibility of a small side branch intraductal     papillary mucinous tumor was raised as another possible etiology     for this finding. 3. CT scan of abdomen with and without IV contrast from 08/30/2009     showed similar appearance of 2 cystic lesions in  the pancreas felt     to be pseudocysts.  There was no acute abdominal process. 4. CT scan of abdomen and pelvis with and without IV contrast from     08/26/2010 showed stable appearance of the cystic lesions within     the pancreas, likely pseudocysts. 5. CT scan of abdomen and pelvis with and without IV contrast from     08/26/2011 again showed stable appearance of these cysts.  IMPRESSION AND PLAN:  At this point I think we can be fairly confident that the cystic lesions in the pancreas are benign.  Of some concern is the patient's weight loss, intermittent diarrhea and occasional nausea and vomiting.  Perhaps she has irritable bowel  syndrome.  If she continues to lose weight or her symptoms worsen she may benefit from a GI evaluation.  From my standpoint, I do not think the patient needs ongoing followup. I would certainly be happy to see her again should any questions or concerns arise in the future.    ______________________________ Samul Dada, M.D. DSM/MEDQ  D:  08/29/2011  T:  08/29/2011  Job:  [id number]

## 2011-08-29 NOTE — Progress Notes (Signed)
This office note has been dictated.  #[id number]

## 2012-02-18 ENCOUNTER — Other Ambulatory Visit (HOSPITAL_COMMUNITY): Payer: Self-pay | Admitting: Chiropractic Medicine

## 2012-02-18 ENCOUNTER — Ambulatory Visit (HOSPITAL_COMMUNITY)
Admission: RE | Admit: 2012-02-18 | Discharge: 2012-02-18 | Disposition: A | Discharge status: Home / Self Care | Payer: Medicare Other | Source: Ambulatory Visit | Attending: Chiropractic Medicine | Admitting: Chiropractic Medicine

## 2012-02-18 DIAGNOSIS — M47812 Spondylosis without myelopathy or radiculopathy, cervical region: Secondary | ICD-10-CM | POA: Insufficient documentation

## 2012-02-18 DIAGNOSIS — M542 Cervicalgia: Secondary | ICD-10-CM

## 2012-04-14 DIAGNOSIS — R7303 Prediabetes: Secondary | ICD-10-CM

## 2012-04-14 HISTORY — DX: Prediabetes: R73.03

## 2013-01-27 ENCOUNTER — Telehealth: Payer: Self-pay

## 2013-01-27 MED ORDER — NIACIN ER (ANTIHYPERLIPIDEMIC) 1000 MG PO TBCR: 2000.0000 mg | EXTENDED_RELEASE_TABLET | Freq: Every day | ORAL | Status: DC

## 2013-01-27 NOTE — Telephone Encounter (Signed)
Rx Refill

## 2013-05-09 ENCOUNTER — Other Ambulatory Visit: Payer: Self-pay | Admitting: Internal Medicine

## 2013-05-09 DIAGNOSIS — I779 Disorder of arteries and arterioles, unspecified: Secondary | ICD-10-CM

## 2013-05-10 ENCOUNTER — Ambulatory Visit
Admission: RE | Admit: 2013-05-10 | Discharge: 2013-05-10 | Disposition: A | Discharge status: Home / Self Care | Payer: Medicare Other | Source: Ambulatory Visit | Attending: Internal Medicine | Admitting: Internal Medicine

## 2013-05-10 DIAGNOSIS — I779 Disorder of arteries and arterioles, unspecified: Secondary | ICD-10-CM

## 2013-05-12 ENCOUNTER — Other Ambulatory Visit: Payer: Self-pay | Admitting: Internal Medicine

## 2013-05-12 DIAGNOSIS — G458 Other transient cerebral ischemic attacks and related syndromes: Secondary | ICD-10-CM

## 2013-05-17 ENCOUNTER — Ambulatory Visit
Admission: RE | Admit: 2013-05-17 | Discharge: 2013-05-17 | Disposition: A | Discharge status: Home / Self Care | Payer: Medicare Other | Source: Ambulatory Visit | Attending: Internal Medicine | Admitting: Internal Medicine

## 2013-05-17 DIAGNOSIS — G458 Other transient cerebral ischemic attacks and related syndromes: Secondary | ICD-10-CM

## 2013-05-17 MED ORDER — IOHEXOL 350 MG/ML SOLN
80.0000 mL | Freq: Once | INTRAVENOUS | Status: AC | PRN
  Administered 2013-05-17: 80 mL via INTRAVENOUS

## 2013-08-01 ENCOUNTER — Telehealth: Payer: Self-pay | Admitting: *Deleted

## 2013-08-01 NOTE — Telephone Encounter (Signed)
Patient requests pravastatin refill be sent to [facility]. She is out so she would like a 10 day supply [facility] [address] in [place].

## 2013-08-02 ENCOUNTER — Telehealth: Payer: Self-pay | Admitting: Cardiology

## 2013-08-02 MED ORDER — PRAVASTATIN SODIUM 80 MG PO TABS: 80.0000 mg | ORAL_TABLET | Freq: Every day | ORAL | Status: DC

## 2013-08-02 NOTE — Telephone Encounter (Signed)
Refill Pravastatin

## 2013-08-02 NOTE — Telephone Encounter (Signed)
Refill granted 

## 2013-10-06 ENCOUNTER — Encounter: Payer: Self-pay | Admitting: Cardiology

## 2013-11-02 ENCOUNTER — Ambulatory Visit: Payer: Medicare Other | Admitting: Cardiology

## 2013-11-28 ENCOUNTER — Other Ambulatory Visit: Payer: Self-pay | Admitting: *Deleted

## 2013-11-28 MED ORDER — NIACIN ER (ANTIHYPERLIPIDEMIC) 1000 MG PO TBCR: 2000.0000 mg | EXTENDED_RELEASE_TABLET | Freq: Every day | ORAL | Status: DC

## 2013-12-07 ENCOUNTER — Ambulatory Visit: Payer: Medicare Other | Admitting: Cardiology

## 2013-12-13 ENCOUNTER — Encounter: Payer: Self-pay | Admitting: Cardiology

## 2013-12-13 ENCOUNTER — Ambulatory Visit (INDEPENDENT_AMBULATORY_CARE_PROVIDER_SITE_OTHER): Payer: Medicare Other | Admitting: Cardiology

## 2013-12-13 VITALS — BP 114/62 | HR 69 | Ht 64.0 in | Wt 126.0 lb

## 2013-12-13 DIAGNOSIS — I2510 Atherosclerotic heart disease of native coronary artery without angina pectoris: Secondary | ICD-10-CM | POA: Insufficient documentation

## 2013-12-13 DIAGNOSIS — E7800 Pure hypercholesterolemia, unspecified: Secondary | ICD-10-CM | POA: Insufficient documentation

## 2013-12-13 DIAGNOSIS — F4321 Adjustment disorder with depressed mood: Secondary | ICD-10-CM

## 2013-12-13 DIAGNOSIS — E1169 Type 2 diabetes mellitus with other specified complication: Secondary | ICD-10-CM | POA: Insufficient documentation

## 2013-12-13 MED ORDER — NITROGLYCERIN 0.4 MG/SPRAY TL SOLN: 1.0000 | Status: DC | PRN

## 2013-12-13 NOTE — Progress Notes (Signed)
[facility] [address] [place], [place]  [address] Phone: [phone] Fax:  [phone]  Date:  12/13/2013   ID:  ROMAN SANDALL, DOB 1940-10-01, MRN [id number]  PCP:  Valaria Good, MD   History of Present Illness: Kathleen Williamson is a 73 y.o. female with coronary artery disease status post bypass in 2003 (CABG-LIMA-LAD, free right IMA to PDA,SVG-diagonal, SVG-OM 2-OM 5 03/2002), carotid artery disease, dermatomyositis, depression, hyperlipidemia, hypothyroidism here for annual followup. Former patient of Dr. Leonia Reeves. Nuclear stress test in 2012 showed no ischemia, 7 minutes exercise time.  Father and G father when he died.  She denies any active discomfort, shortness of breath. Her main complaints today are paroxysmal diarrhea and intestinal dyspepsia. She also complains of restless leg syndrome. She has been compliant with her medications.   She repeated to me her story of her symptoms prior to bypass. She was singing in the church choir, felt an elephant sitting on her chest, felt poorly, pulse was erratic. She did not to the hospital at that time. The next day she saw an internist who sent her to Dr. Leonia Reeves.   Her husband died in 2013/10/02 currently grieving.    Wt Readings from Last 3 Encounters:  12/13/13 126 lb (57.153 kg)  08/29/11 131 lb 14.4 oz (59.829 kg)     No past medical history on file.  No past surgical history on file.  Current Outpatient Prescriptions  Medication Sig Dispense Refill  . Ascorbic Acid (VITAMIN C) 1000 MG tablet Take 1,000 mg by mouth daily.      Marland Kitchen aspirin 325 MG tablet Take 325 mg by mouth daily.      . Cholecalciferol (VITAMIN D PO) Take 100 mg by mouth daily.      . Coenzyme Q10 (COQ10) 100 MG CAPS Take 1 capsule by mouth daily.      . cycloSPORINE (RESTASIS) 0.05 % ophthalmic emulsion Place 1 drop into both eyes 2 (two) times daily.      Marland Kitchen levothyroxine (SYNTHROID, LEVOTHROID) 50 MCG tablet Take 50 mcg by mouth daily.        . niacin (NIASPAN) 1000 MG CR tablet Take 2 tablets (2,000 mg total) by mouth at bedtime.  60 tablet  0  . nitroGLYCERIN (NITROLINGUAL) 0.4 MG/SPRAY spray Place 1 spray under the tongue every 5 (five) minutes as needed.      . pravastatin (PRAVACHOL) 80 MG tablet Take 1 tablet (80 mg total) by mouth daily.  10 tablet  0  . sertraline (ZOLOFT) 100 MG tablet Take 150 mg by mouth daily.       No current facility-administered medications for this visit.    Allergies:    Allergies  Allergen Reactions  . Sulfa Antibiotics Nausea Only    Social History:  The patient  reports that she has never smoked. She does not have any smokeless tobacco history on file.   No family history on file.  ROS:  Please see the history of present illness.   Denies any fevers, chills, orthopnea, PND.  All other systems reviewed and negative.   PHYSICAL EXAM: VS:  BP 114/62  Pulse 69  Ht 5\' 4"  (1.626 m)  Wt 126 lb (57.153 kg)  BMI 21.62 kg/m2 Well nourished, well developed, in no acute distress HEENT: normal, [place]/AT, EOMI Neck: no JVD, normal carotid upstroke, no bruit Cardiac:  normal S1, S2; RRR; no murmur Lungs:  clear to auscultation bilaterally, no  wheezing, rhonchi or rales Abd: soft, nontender, no hepatomegaly, no bruits Ext: no edema, 2+ distal pulses Skin: warm and dry GU: deferred Neuro: no focal abnormalities noted, AAO x 3  EKG:  12/13/13-sinus rhythm, PVC, nonspecific ST-T wave changes, heart rate 69, QTC 454 ms there this is microphones well there     ASSESSMENT AND PLAN:  1. Coronary artery disease-status post bypass in 03/2002 x 5. Overall doing well without any anginal symptoms. Continuing with secondary prevention. 2. Dermatomyositis-symptoms originally in 2010. Apparently this has resolved. 3. Hyperlipidemia-LDL goal of 70. We have decided to stop her Niaspan. Continue with pravastatin. We will check her cholesterol in one year if it is not checked by her new primary physician. She  used to see Dr. Maxwell Caul. 4. Grieving-continue to encourage exercise, community. She is on Zoloft.  Signed, Candee Furbish, MD [facility]  12/13/2013 10:22 AM

## 2013-12-13 NOTE — Patient Instructions (Signed)
Please stop Niaspan.  Continue all other medications as listed.  Follow up in 1 year with Dr. Marlou Porch.  You will receive a letter in the mail 2 months before you are due.  Please call us when you receive this letter to schedule your follow up appointment.

## 2014-01-12 ENCOUNTER — Emergency Department (HOSPITAL_COMMUNITY)
Admission: EM | Admit: 2014-01-12 | Discharge: 2014-01-12 | Disposition: A | Discharge status: Home / Self Care | Payer: Medicare Other | Attending: Emergency Medicine | Admitting: Emergency Medicine

## 2014-01-12 ENCOUNTER — Encounter (HOSPITAL_COMMUNITY): Payer: Self-pay | Admitting: Emergency Medicine

## 2014-01-12 DIAGNOSIS — G479 Sleep disorder, unspecified: Secondary | ICD-10-CM | POA: Diagnosis not present

## 2014-01-12 DIAGNOSIS — F332 Major depressive disorder, recurrent severe without psychotic features: Secondary | ICD-10-CM | POA: Diagnosis present

## 2014-01-12 DIAGNOSIS — I252 Old myocardial infarction: Secondary | ICD-10-CM | POA: Diagnosis not present

## 2014-01-12 DIAGNOSIS — Z7982 Long term (current) use of aspirin: Secondary | ICD-10-CM | POA: Diagnosis not present

## 2014-01-12 DIAGNOSIS — F4325 Adjustment disorder with mixed disturbance of emotions and conduct: Secondary | ICD-10-CM

## 2014-01-12 DIAGNOSIS — F4322 Adjustment disorder with anxiety: Secondary | ICD-10-CM | POA: Diagnosis not present

## 2014-01-12 DIAGNOSIS — Z8739 Personal history of other diseases of the musculoskeletal system and connective tissue: Secondary | ICD-10-CM | POA: Diagnosis not present

## 2014-01-12 DIAGNOSIS — F4321 Adjustment disorder with depressed mood: Secondary | ICD-10-CM | POA: Diagnosis present

## 2014-01-12 DIAGNOSIS — Z79899 Other long term (current) drug therapy: Secondary | ICD-10-CM | POA: Diagnosis not present

## 2014-01-12 DIAGNOSIS — F4323 Adjustment disorder with mixed anxiety and depressed mood: Secondary | ICD-10-CM

## 2014-01-12 DIAGNOSIS — F331 Major depressive disorder, recurrent, moderate: Secondary | ICD-10-CM | POA: Diagnosis present

## 2014-01-12 DIAGNOSIS — R45851 Suicidal ideations: Secondary | ICD-10-CM | POA: Diagnosis present

## 2014-01-12 HISTORY — DX: Age-related osteoporosis without current pathological fracture: M81.0

## 2014-01-12 LAB — ACETAMINOPHEN LEVEL: Acetaminophen (Tylenol), Serum: <15 ug/mL (ref 10–30)

## 2014-01-12 LAB — CBC
HCT: 37.4 % (ref 36.0–46.0)
Hemoglobin: 12.3 g/dL (ref 12.0–15.0)
MCH: 29.6 pg (ref 26.0–34.0)
MCHC: 32.9 g/dL (ref 30.0–36.0)
MCV: 90.1 fL (ref 78.0–100.0)
Platelets: 193 10*3/uL (ref 150–400)
RBC: 4.15 MIL/uL (ref 3.87–5.11)
RDW: 13.2 % (ref 11.5–15.5)
WBC: 5.5 10*3/uL (ref 4.0–10.5)

## 2014-01-12 LAB — COMPREHENSIVE METABOLIC PANEL
ALBUMIN: 3.8 g/dL (ref 3.5–5.2)
ALT: 9 U/L (ref 0–35)
ANION GAP: 11 (ref 5–15)
AST: 15 U/L (ref 0–37)
Alkaline Phosphatase: 69 U/L (ref 39–117)
BILIRUBIN TOTAL: 0.5 mg/dL (ref 0.3–1.2)
BUN: 14 mg/dL (ref 6–23)
CHLORIDE: 102 meq/L (ref 96–112)
CO2: 25 mEq/L (ref 19–32)
Calcium: 9.6 mg/dL (ref 8.4–10.5)
Creatinine, Ser: 1.22 mg/dL — ABNORMAL HIGH (ref 0.50–1.10)
GFR calc Af Amer: 50 mL/min — ABNORMAL LOW (ref >90)
GFR, EST NON AFRICAN AMERICAN: 43 mL/min — AB (ref >90)
Glucose, Bld: 92 mg/dL (ref 70–99)
Potassium: 4.1 mEq/L (ref 3.7–5.3)
Sodium: 138 mEq/L (ref 137–147)
Total Protein: 7 g/dL (ref 6.0–8.3)

## 2014-01-12 LAB — ETHANOL: Alcohol, Ethyl (B): <11 mg/dL (ref 0–11)

## 2014-01-12 LAB — SALICYLATE LEVEL: Salicylate Lvl: <2 mg/dL — ABNORMAL LOW (ref 2.8–20.0)

## 2014-01-12 MED ORDER — VITAMIN D3 25 MCG (1000 UNIT) PO TABS: 1000.0000 [IU] | ORAL_TABLET | Freq: Every day | ORAL | Status: DC

## 2014-01-12 MED ORDER — SERTRALINE HCL 50 MG PO TABS: 150.0000 mg | ORAL_TABLET | Freq: Every day | ORAL | Status: DC

## 2014-01-12 MED ORDER — LEVOTHYROXINE SODIUM 50 MCG PO TABS: 50.0000 ug | ORAL_TABLET | Freq: Every day | ORAL | Status: DC

## 2014-01-12 MED ORDER — SIMVASTATIN 40 MG PO TABS: 40.0000 mg | ORAL_TABLET | Freq: Every day | ORAL | Status: DC

## 2014-01-12 MED ORDER — COQ10 100 MG PO CAPS
1.0000 | ORAL_CAPSULE | Freq: Every day | ORAL | Status: DC
Start: 2014-01-12 — End: 2014-01-12

## 2014-01-12 MED ORDER — ASPIRIN 325 MG PO TABS: 325.0000 mg | ORAL_TABLET | Freq: Every day | ORAL | Status: DC

## 2014-01-12 MED ORDER — ZOLPIDEM TARTRATE 5 MG PO TABS: 5.0000 mg | ORAL_TABLET | Freq: Every evening | ORAL | Status: DC | PRN

## 2014-01-12 MED ORDER — ALUM & MAG HYDROXIDE-SIMETH 200-200-20 MG/5ML PO SUSP: 30.0000 mL | ORAL | Status: DC | PRN

## 2014-01-12 MED ORDER — ACETAMINOPHEN 500 MG PO TABS: 500.0000 mg | ORAL_TABLET | Freq: Four times a day (QID) | ORAL | Status: DC | PRN

## 2014-01-12 MED ORDER — VITAMIN C 500 MG PO TABS: 1000.0000 mg | ORAL_TABLET | Freq: Every day | ORAL | Status: DC

## 2014-01-12 MED ORDER — CYCLOSPORINE 0.05 % OP EMUL: 1.0000 [drp] | Freq: Two times a day (BID) | OPHTHALMIC | Status: DC

## 2014-01-12 MED ORDER — NITROGLYCERIN 0.4 MG/SPRAY TL SOLN: 1.0000 | Status: DC | PRN

## 2014-01-12 MED ORDER — LORAZEPAM 1 MG PO TABS: 1.0000 mg | ORAL_TABLET | Freq: Three times a day (TID) | ORAL | Status: DC | PRN

## 2014-01-12 NOTE — ED Provider Notes (Signed)
CSN: [id number]     Arrival date & time 01/12/14  1216 History  This chart was scribed for non-physician practitioner Baron Sane, PA-C, working with Orlie Dakin, MD by Zola Button, ED Scribe. This patient was seen in room WTR3/WLPT3 and the patient's care was started at 12:38 PM.      Chief Complaint  Patient presents with  . Suicidal      The history is provided by the patient. No language interpreter was used.   HPI Comments: Kathleen Williamson is a 73 y.o. female who presents to the Emergency Department from her primary care physician's office for an evaluation of depression and suicidal ideations and an attempt approximately 3-4 weeks ago. Patient states she has been very depressed and lonely after her husband of 62 years passed unexpectedly 6 months ago. She has been very overwhelmed with his death and feels very unprepared to be on her own. She attempted to kill herself 3-4 weeks ago with an overdose of Extra Strength Tylenol and Zoloft, but states it did not work. She is not currently suicidal and has no plan to re-attempt overdose. No HI, hallucinations, alcohol or recreational drug use. No physical complaints.   PCP: Dr. Michail Sermon   Past Medical History  Diagnosis Date  . MI (myocardial infarction) 2003  . Osteoporosis    History reviewed. No pertinent past surgical history. History reviewed. No pertinent family history. History  Substance Use Topics  . Smoking status: Never Smoker   . Smokeless tobacco: Not on file  . Alcohol Use: No   OB History   Grav Para Term Preterm Abortions TAB SAB Ect Mult Living                 Review of Systems  Constitutional: Negative for fever and chills.  Psychiatric/Behavioral: Positive for suicidal ideas, sleep disturbance and dysphoric mood. Negative for hallucinations and self-injury.  All other systems reviewed and are negative.     Allergies  Sulfa antibiotics  Home Medications   Prior to Admission medications    Medication Sig Start Date End Date Taking? Authorizing Provider  aspirin EC 81 MG tablet Take 81 mg by mouth at bedtime.   Yes Historical Provider, MD  Cholecalciferol (VITAMIN D PO) Take 1 tablet by mouth daily with breakfast.    Yes Historical Provider, MD  clonazePAM (KLONOPIN) 0.5 MG tablet Take 0.25 mg by mouth 2 (two) times daily as needed for anxiety.   Yes Historical Provider, MD  Coenzyme Q10 (COQ10) 100 MG CAPS Take 1 capsule by mouth at bedtime.    Yes Historical Provider, MD  cycloSPORINE (RESTASIS) 0.05 % ophthalmic emulsion Place 1 drop into both eyes 2 (two) times daily as needed (for dry eyes).    Yes Historical Provider, MD  Influenza Vac Split Quad (FLUZONE) 0.25 ML injection Inject 0.25 mLs into the muscle once.   Yes Historical Provider, MD  levothyroxine (SYNTHROID, LEVOTHROID) 50 MCG tablet Take 50 mcg by mouth See admin instructions. Takes everyday except for Sunday's   Yes Historical Provider, MD  nitroGLYCERIN (NITROLINGUAL) 0.4 MG/SPRAY spray Place 1 spray under the tongue every 5 (five) minutes as needed. 12/13/13  Yes Candee Furbish, MD  pravastatin (PRAVACHOL) 80 MG tablet Take 1 tablet (80 mg total) by mouth daily. 08/02/13  Yes Candee Furbish, MD  sertraline (ZOLOFT) 100 MG tablet Take 200 mg by mouth daily with breakfast.    Yes Historical Provider, MD   BP 154/63  Pulse 65  Temp(Src) 98.2 F (  36.8 C) (Oral)  Resp 16  SpO2 100% Physical Exam  Nursing note and vitals reviewed. Constitutional: She is oriented to person, place, and time. She appears well-developed and well-nourished. No distress.  Patient tearful  HENT:  Head: Normocephalic and atraumatic.  Right Ear: External ear normal.  Left Ear: External ear normal.  Nose: Nose normal.  Mouth/Throat: Oropharynx is clear and moist.  Eyes: Conjunctivae are normal.  Neck: Normal range of motion. Neck supple.  Cardiovascular: Normal rate, regular rhythm and normal heart sounds.   Pulmonary/Chest: Effort normal and  breath sounds normal.  Abdominal: Soft. Bowel sounds are normal. There is no tenderness.  Musculoskeletal: Normal range of motion.  Neurological: She is alert and oriented to person, place, and time.  Skin: Skin is warm and dry. She is not diaphoretic.  Psychiatric: Her speech is normal. Judgment normal. She is not actively hallucinating. Cognition and memory are normal. She exhibits a depressed mood. She expresses suicidal ideation. She expresses no homicidal ideation. She expresses suicidal plans. She expresses no homicidal plans.    ED Course  Procedures (including critical care time) Medications  acetaminophen (TYLENOL) tablet 500 mg (not administered)  alum & mag hydroxide-simeth (MAALOX/MYLANTA) 200-200-20 MG/5ML suspension 30 mL (not administered)  zolpidem (AMBIEN) tablet 5 mg (not administered)  LORazepam (ATIVAN) tablet 1 mg (not administered)  vitamin C (ASCORBIC ACID) tablet 1,000 mg (not administered)  aspirin tablet 325 mg (not administered)  CoQ10 CAPS 1 capsule (not administered)  cycloSPORINE (RESTASIS) 0.05 % ophthalmic emulsion 1 drop (not administered)  levothyroxine (SYNTHROID, LEVOTHROID) tablet 50 mcg (not administered)  nitroGLYCERIN (NITROLINGUAL) 0.4 MG/SPRAY spray 1 spray (not administered)  simvastatin (ZOCOR) tablet 40 mg (not administered)  sertraline (ZOLOFT) tablet 150 mg (not administered)  cholecalciferol (VITAMIN D) tablet 1,000 Units (not administered)    Labs Review Labs Reviewed  COMPREHENSIVE METABOLIC PANEL - Abnormal; Notable for the following:    Creatinine, Ser 1.22 (*)    GFR calc non Af Amer 43 (*)    GFR calc Af Amer 50 (*)    All other components within normal limits  SALICYLATE LEVEL - Abnormal; Notable for the following:    Salicylate Lvl <4.0 (*)    All other components within normal limits  ACETAMINOPHEN LEVEL  CBC  ETHANOL  URINE RAPID DRUG SCREEN (HOSP PERFORMED)  URINALYSIS, ROUTINE W REFLEX MICROSCOPIC    Imaging  Review No results found.   EKG Interpretation None      MDM   Final diagnoses:  Adjustment disorder with mixed anxiety and depressed mood  Grieving    Filed Vitals:   01/12/14 1521  BP: 154/63  Pulse: 65  Temp:   Resp: 16   Afebrile, NAD, non-toxic appearing, AAOx4. Patient is tearful with depressed affect. No current suicidal ideations with plan. No HI, hallucinations, self injury, alcohol or recreational drug use. Labs reviewed. The patient has been seen by psychiatry and recommended outpatient followup for therapy. Discussed this with the patient who is agreeable to outpatient treatment, as is her preferred method of treatment. Will discharge patient home. Return precautions discussed. Patient is stable at time of discharge. Patient d/w with Dr. Winfred Leeds, agrees with plan.        I personally performed the services described in this documentation, which was scribed in my presence. The recorded information has been reviewed and is accurate.     Harlow Mares, PA-C 01/12/14 1631

## 2014-01-12 NOTE — ED Notes (Signed)
Psychiatry at bedside.

## 2014-01-12 NOTE — ED Provider Notes (Signed)
Patient reports that she is depressed increasingly since her husband died suddenly and unexpectedly in June of 2015. She has financial concerns, and that she needs to sell her current form in house and oriented to get into a retirement community. she reports having taken an overdose of medication several weeks ago however vehemently denies suicidal ideations presently. Presently patient is alert Glasgow Coma Score 15 ambulatory without difficulty. Tearful. Depressed affect. Psychiatry consulted to evaluate patient. I feel the patient would benefit from outpatient therapy and counseling  Orlie Dakin, MD 01/12/14 1438

## 2014-01-12 NOTE — Consult Note (Signed)
[facility] Consult   Reason for Consult:  Worsening depression Referring Physician:  EDP  [name] is an 73 y.o. female. Total Time spent with patient: 45 minutes  Assessment: AXIS I:  Adjustment Disorder with Mixed Emotional Features and Grieving AXIS II:  Deferred AXIS III:   Past Medical History  Diagnosis Date  . MI (myocardial infarction) 2003  . Osteoporosis    AXIS IV:  other psychosocial or environmental problems and problems related to social environment AXIS V:  51-60 moderate symptoms  Plan:  No evidence of imminent risk to self or others at present.   Supportive therapy provided about ongoing stressors. Discussed crisis plan, support from social network, calling 911, coming to the Emergency Department, and calling Suicide Hotline. Outpatient services  Subjective:   Kathleen Williamson is a 73 y.o. female patient who presents to [facility] with complaints of worsening depression and feeling overwhelmed.  Patient states that her husband died suddenly and unexpectedly in June of 2015.  Patient states that she is still adjusting to the death of her husband and neither having things ready or taken care if one passed.  Patient states that she is feeling [place] and unable to take care of every thing. Patient has family support; but she lives alone and has no children.     HPI Elements:   Location:  Depression. Quality:  grief. Severity:  worsening depression. Timing:  2 to 3 months. Review of Systems  Constitutional: Negative for chills, malaise/fatigue and diaphoresis.  Respiratory: Negative for shortness of breath and wheezing.   Cardiovascular: Negative for chest pain.       History of MI at age of 64  Gastrointestinal: Negative for nausea, vomiting, abdominal pain, diarrhea and constipation.  Musculoskeletal: Negative.   Neurological: Negative for seizures, loss of consciousness, weakness and headaches.  Psychiatric/Behavioral:  Positive for depression. Negative for suicidal ideas, hallucinations, memory loss and substance abuse. The patient is nervous/anxious. The patient does not have insomnia.   All other systems reviewed and are negative.  History reviewed. No pertinent family history.  Past Psychiatric History: Past Medical History  Diagnosis Date  . MI (myocardial infarction) 2003  . Osteoporosis     reports that she has never smoked. She does not have any smokeless tobacco history on file. She reports that she does not drink alcohol or use illicit drugs. History reviewed. No pertinent family history.         Allergies:   Allergies  Allergen Reactions  . Sulfa Antibiotics Nausea Only    ACT Assessment Complete:  Yes:    Educational Status    Risk to Self: Risk to self with the past 6 months Is patient at risk for suicide?: Yes Substance abuse history and/or treatment for substance abuse?: No  Risk to Others:    Abuse:    Prior Inpatient Therapy:    Prior Outpatient Therapy:    Additional Information:          Objective: Blood pressure 138/57, pulse 59, temperature 98.2 F (36.8 C), temperature source Oral, resp. rate 16, SpO2 100.00%.There is no weight on file to calculate BMI. Results for orders placed during the hospital encounter of 01/12/14 (from the past 72 hour(s))  ACETAMINOPHEN LEVEL     Status: None   Collection Time    01/12/14 12:51 PM      Result Value Ref Range   Acetaminophen (Tylenol), Serum <15.0  10 - 30 ug/mL  Comment:            THERAPEUTIC CONCENTRATIONS VARY     SIGNIFICANTLY. A RANGE OF 10-30     ug/mL MAY BE AN EFFECTIVE     CONCENTRATION FOR MANY PATIENTS.     HOWEVER, SOME ARE BEST TREATED     AT CONCENTRATIONS OUTSIDE THIS     RANGE.     ACETAMINOPHEN CONCENTRATIONS     >150 ug/mL AT 4 HOURS AFTER     INGESTION AND >50 ug/mL AT 12     HOURS AFTER INGESTION ARE     OFTEN ASSOCIATED WITH TOXIC     REACTIONS.  CBC     Status: None   Collection Time     01/12/14 12:51 PM      Result Value Ref Range   WBC 5.5  4.0 - 10.5 K/uL   RBC 4.15  3.87 - 5.11 MIL/uL   Hemoglobin 12.3  12.0 - 15.0 g/dL   HCT 37.4  36.0 - 46.0 %   MCV 90.1  78.0 - 100.0 fL   MCH 29.6  26.0 - 34.0 pg   MCHC 32.9  30.0 - 36.0 g/dL   RDW 13.2  11.5 - 15.5 %   Platelets 193  150 - 400 K/uL  COMPREHENSIVE METABOLIC PANEL     Status: Abnormal   Collection Time    01/12/14 12:51 PM      Result Value Ref Range   Sodium 138  137 - 147 mEq/L   Potassium 4.1  3.7 - 5.3 mEq/L   Chloride 102  96 - 112 mEq/L   CO2 25  19 - 32 mEq/L   Glucose, Bld 92  70 - 99 mg/dL   BUN 14  6 - 23 mg/dL   Creatinine, Ser 1.22 (*) 0.50 - 1.10 mg/dL   Calcium 9.6  8.4 - 10.5 mg/dL   Total Protein 7.0  6.0 - 8.3 g/dL   Albumin 3.8  3.5 - 5.2 g/dL   AST 15  0 - 37 U/L   ALT 9  0 - 35 U/L   Alkaline Phosphatase 69  39 - 117 U/L   Total Bilirubin 0.5  0.3 - 1.2 mg/dL   GFR calc non Af Amer 43 (*) >90 mL/min   GFR calc Af Amer 50 (*) >90 mL/min   Comment: (NOTE)     The eGFR has been calculated using the CKD EPI equation.     This calculation has not been validated in all clinical situations.     eGFR's persistently <90 mL/min signify possible Chronic Kidney     Disease.   Anion gap 11  5 - 15  ETHANOL     Status: None   Collection Time    01/12/14 12:51 PM      Result Value Ref Range   Alcohol, Ethyl (B) <11  0 - 11 mg/dL   Comment:            LOWEST DETECTABLE LIMIT FOR     SERUM ALCOHOL IS 11 mg/dL     FOR MEDICAL PURPOSES ONLY  SALICYLATE LEVEL     Status: Abnormal   Collection Time    01/12/14 12:51 PM      Result Value Ref Range   Salicylate Lvl <4.6 (*) 2.8 - 20.0 mg/dL   Labs are reviewed see above values.  Medications reviewed and no changes made.   Current Facility-Administered Medications  Medication Dose Route Frequency Provider Last Rate Last Dose  .  acetaminophen (TYLENOL) tablet 500 mg  500 mg Oral Q6H PRN Jennifer L Piepenbrink, PA-C      . alum & mag  hydroxide-simeth (MAALOX/MYLANTA) 200-200-20 MG/5ML suspension 30 mL  30 mL Oral PRN Jennifer L Piepenbrink, PA-C      . aspirin tablet 325 mg  325 mg Oral Daily Jennifer L Piepenbrink, PA-C      . cholecalciferol (VITAMIN D) tablet 1,000 Units  1,000 Units Oral Daily Jennifer L Piepenbrink, PA-C      . CoQ10 CAPS 1 capsule  1 capsule Oral Daily Jennifer L Piepenbrink, PA-C      . cycloSPORINE (RESTASIS) 0.05 % ophthalmic emulsion 1 drop  1 drop Both Eyes BID Jennifer L Piepenbrink, PA-C      . levothyroxine (SYNTHROID, LEVOTHROID) tablet 50 mcg  50 mcg Oral Daily Jennifer L Piepenbrink, PA-C      . LORazepam (ATIVAN) tablet 1 mg  1 mg Oral Q8H PRN Jennifer L Piepenbrink, PA-C      . nitroGLYCERIN (NITROLINGUAL) 0.4 MG/SPRAY spray 1 spray  1 spray Sublingual Q5 min PRN Jennifer L Piepenbrink, PA-C      . sertraline (ZOLOFT) tablet 150 mg  150 mg Oral Daily Jennifer L Piepenbrink, PA-C      . simvastatin (ZOCOR) tablet 40 mg  40 mg Oral q1800 Jennifer L Piepenbrink, PA-C      . vitamin C (ASCORBIC ACID) tablet 1,000 mg  1,000 mg Oral Daily Jennifer L Piepenbrink, PA-C      . zolpidem (AMBIEN) tablet 5 mg  5 mg Oral QHS PRN Stephani Police Piepenbrink, PA-C       Current Outpatient Prescriptions  Medication Sig Dispense Refill  . sertraline (ZOLOFT) 100 MG tablet Take 200 mg by mouth daily with breakfast.       . Ascorbic Acid (VITAMIN C) 1000 MG tablet Take 1,000 mg by mouth daily.      . Cholecalciferol (VITAMIN D PO) Take 100 mg by mouth daily.      . Coenzyme Q10 (COQ10) 100 MG CAPS Take 1 capsule by mouth daily.      . cycloSPORINE (RESTASIS) 0.05 % ophthalmic emulsion Place 1 drop into both eyes 2 (two) times daily.      Marland Kitchen levothyroxine (SYNTHROID, LEVOTHROID) 50 MCG tablet Take 50 mcg by mouth daily.      . nitroGLYCERIN (NITROLINGUAL) 0.4 MG/SPRAY spray Place 1 spray under the tongue every 5 (five) minutes as needed.  12 g  prn  . pravastatin (PRAVACHOL) 80 MG tablet Take 1 tablet (80 mg  total) by mouth daily.  10 tablet  0    Psychiatric Specialty Exam:     Blood pressure 138/57, pulse 59, temperature 98.2 F (36.8 C), temperature source Oral, resp. rate 16, SpO2 100.00%.There is no weight on file to calculate BMI.  General Appearance: Casual and Well Groomed  Eye Contact::  Good  Speech:  Clear and Coherent and Normal Rate  Volume:  Normal  Mood:  Depressed and tearful  Affect:  Depressed and Tearful  Thought Process:  Circumstantial and Goal Directed  Orientation:  Full (Time, Place, and Person)  Thought Content:  Rumination  Suicidal Thoughts:  No  Homicidal Thoughts:  No  Memory:  Immediate;   Good Recent;   Good Remote;   Good  Judgement:  Intact  Insight:  Fair and Present  Psychomotor Activity:  Normal  Concentration:  Fair  Recall:  Good  Fund of Knowledge:Good  Language: Good  Akathisia:  No  Handed:  Right  AIMS (if indicated):     Assets:  Communication Skills Desire for Improvement Housing Resilience Social Support Transportation  Sleep:      Musculoskeletal: Strength & Muscle Tone: within normal limits Gait & Station: normal Patient leans: N/A  Treatment Plan Summary: Discharge home with appointment for outpatient services (medication management and therapy)     Rankin, Shuvon, FNP-BC 01/12/2014 2:30 PM

## 2014-01-12 NOTE — ED Notes (Signed)
Pt overwhelmed by changes since husband's death. Reports plan to overdose on pills and states she has access to pills she could use at home. Has not acted on those thoughts. Pt tearful, but pleasant.

## 2014-01-12 NOTE — [facility] Counselor (Signed)
Pt is being d/c with instructions to enroll with an outpatient provider. Writer left voicemail for Agustina Caroli PhD to call pt to arrange therapy appointment. Writer also gave pt list of following resources:  [place]:  [phone] or [facility] [address], [place] [address]  Psychiatrists [facility]   [facility] [address]    [address] [place], [place] [address]    [place], [place] [address] [phone]      [phone]  Dr. Norma Fredrickson     [facility] [address]    [place] [address]    [place] [address] [phone]      [phone]  Therapists [facility]    [facility] [address], [place]      [phone]  [facility] [address]     [address] [address]    [place], [place]      [phone]   Mobile Crisis Teams [facility]    Assertive  Mobile Crisis Care Unit    [facility] [phone]     [address] [place], [place]  These referrals have been provided to you as appropriate for your clinical needs while taking into account your financial concerns. Please be aware that agencies, practitioners and insurance companies sometimes change contracts. When calling to make an appointment have your insurance information available so the professional you are going to see can confirm whether they are covered by your plan. Take this form with you in case the person you are seeing needs a copy or to contact us.  Arnold Long, [place] Assessment Counselor

## 2014-01-12 NOTE — [facility] Suicide Risk Assessment (Cosign Needed)
Suicide Risk Assessment  Discharge Assessment     Demographic Factors:  Caucasian and female  Total Time spent with patient: 30 minutes  Psychiatric Specialty Exam:     Blood pressure 138/57, pulse 59, temperature 98.2 F (36.8 C), temperature source Oral, resp. rate 16, SpO2 100.00%.There is no weight on file to calculate BMI.   General Appearance: Casual and Well Groomed   Eye Contact:: Good   Speech: Clear and Coherent and Normal Rate   Volume: Normal   Mood: Depressed and tearful   Affect: Depressed and Tearful   Thought Process: Circumstantial and Goal Directed   Orientation: Full (Time, Place, and Person)   Thought Content: Rumination   Suicidal Thoughts: No   Homicidal Thoughts: No   Memory: Immediate; Good  Recent; Good  Remote; Good   Judgement: Intact   Insight: Fair and Present   Psychomotor Activity: Normal   Concentration: Fair   Recall: Good   Fund of Knowledge:Good   Language: Good   Akathisia: No   Handed: Right   AIMS (if indicated):   Assets: Communication Skills  Desire for Improvement  Housing  Resilience  Social Support  Transportation   Sleep:   Musculoskeletal:  Strength & Muscle Tone: within normal limits  Gait & Station: normal  Patient leans: N/A    Mental Status Per Nursing Assessment::   On Admission:     Current Mental Status by Physician: Patient denies suicidal/homicidal ideation, psychosis, and paranoia  Loss Factors: Loss of significant relationship  Historical Factors: NA  Risk Reduction Factors:   Sense of responsibility to family and Positive social support  Continued Clinical Symptoms:  Depression  Cognitive Features That Contribute To Risk:  None noted    Suicide Risk:  Minimal: No identifiable suicidal ideation.  Patients presenting with no risk factors but with morbid ruminations; may be classified as minimal risk based on the severity of the depressive symptoms  Discharge Diagnoses:  AXIS I:  Adjustment Disorder with Mixed Emotional Features and Grieving  AXIS II: Deferred  AXIS III:  Past Medical History   Diagnosis  Date   .  MI (myocardial infarction)  2003   .  Osteoporosis     AXIS IV: other psychosocial or environmental problems and problems related to social environment  AXIS V: 51-60 moderate symptoms    Plan Of Care/Follow-up recommendations:  Activity:  as tolerated Diet:  as tolerated  Is patient on multiple antipsychotic therapies at discharge:  No   Has Patient had three or more failed trials of antipsychotic monotherapy by history:  No  Recommended Plan for Multiple Antipsychotic Therapies: NA    Rankin, Shuvon, FNP-BC 01/12/2014, 2:54 PM

## 2014-01-12 NOTE — ED Notes (Signed)
Pt sent here for evaluation of depression and suicidal thoughts with plan per doctor's note. Pt recently lost her spouse and has no community support.

## 2014-01-12 NOTE — Discharge Instructions (Signed)
Please follow up with your primary care physician in 1-2 days. If you do not have one please call the [place] number listed above. Please keep your follow up appointment as advised by Dr. Lovena Le. Please continue to take all of your medications as prescribed. Please read all discharge instructions and return precautions.    Adjustment Disorder Most changes in life can cause stress. Getting used to changes may take a few months or longer. If feelings of stress, hopelessness, or worry continue, you may have an adjustment disorder. This stress-related mental health problem may affect your feelings, thinking and how you act. It occurs in both sexes and happens at any age. SYMPTOMS  Some of the following problems may be seen and vary from person to person:  Sadness or depression.  Loss of enjoyment.  Thoughts of suicide.  Fighting.  Avoiding family and friends.  Poor school performance.  Hopelessness, sense of loss.  Trouble sleeping.  Vandalism.  Worry, weight loss or gain.  Crying spells.  Anxiety  Reckless driving.  Skipping school.  Poor work Systems analyst.  Nervousness.  Ignoring bills.  Poor attitude. DIAGNOSIS  Your caregiver will ask what has happened in your life and do a physical exam. They will make a diagnosis of an adjustment disorder when they are sure another problem or medical illness causing your feelings does not exist. TREATMENT  When problems caused by stress interfere with you daily life or last longer than a few months, you may need counseling for an adjustment disorder. Early treatment may diminish problems and help you to better cope with the stressful events in your life. Sometimes medication is necessary. Individual counseling and or support groups can be very helpful. PROGNOSIS  Adjustment disorders usually last less than 3 to 6 months. The condition may persist if there is long lasting stress. This could include health  problems, relationship problems, or job difficulties where you can not easily escape from what is causing the problem. PREVENTION  Even the most mentally healthy, highly functioning people can suffer from an adjustment disorder given a significant blow from a life-changing event. There is no way to prevent pain and loss. Most people need help from time to time. You are not alone. SEEK MEDICAL CARE IF:  Your feelings or symptoms listed above do not improve or worsen. Document Released: 12/03/2005 Document Revised: 06/23/2011 Document Reviewed: 02/24/2007 [facility] Patient Information 2015 [place], [place]. This information is not intended to replace advice given to you by your health care provider. Make sure you discuss any questions you have with your health care provider.  Grief Reaction Grief is a normal response to the death of someone close to you. Feelings of fear, anger, and guilt can affect almost everyone who loses someone they love. Symptoms of depression are also common. These include problems with sleep, loss of appetite, and lack of energy. These grief reaction symptoms often last for weeks to months after a loss. They may also return during special times that remind you of the person you lost, such as an anniversary or birthday. Anxiety, insomnia, irritability, and deep depression may last beyond the period of normal grief. If you experience these feelings for 6 months or longer, you may have clinical depression. Clinical depression requires further medical attention. If you think that you have clinical depression, you should contact your caregiver. If you have a history of depression or a family history of depression, you are at greater risk of clinical depression. You are  also at greater risk of developing clinical depression if the loss was traumatic or the loss was of someone with whom you had unresolved issues.  A grief reaction can become complicated by being blocked. This means being  unable to cry or express extreme emotions. This may prolong the grieving period and worsen the emotional effects of the loss. Mourning is a natural event in human life. A healthy grief reaction is one that is not blocked. It requires a time of sadness and readjustment. It is very important to share your sorrow and fear with others, especially close friends and family. Professional counselors and clergy can also help you process your grief. Document Released: 03/31/2005 Document Revised: 08/15/2013 Document Reviewed: 12/09/2005 [facility] Patient Information 2015 [place], [place]. This information is not intended to replace advice given to you by your health care provider. Make sure you discuss any questions you have with your health care provider.

## 2014-01-12 NOTE — Consult Note (Signed)
Face to face evaluation and I agree with this note 

## 2014-01-13 NOTE — ED Provider Notes (Signed)
Medical screening examination/treatment/procedure(s) were conducted as a shared visit with non-physician practitioner(s) and myself.  I personally evaluated the patient during the encounter.   EKG Interpretation None       Orlie Dakin, MD 01/13/14 [phone]

## 2014-01-31 LAB — CBC
HCT: 38.6 % (ref 35.0–47.0)
HGB: 12.6 g/dL (ref 12.0–16.0)
MCH: 29.5 pg (ref 26.0–34.0)
MCHC: 32.6 g/dL (ref 32.0–36.0)
MCV: 90 fL (ref 80–100)
Platelet: 201 10*3/uL (ref 150–440)
RBC: 4.27 10*6/uL (ref 3.80–5.20)
RDW: 13.7 % (ref 11.5–14.5)
WBC: 5.9 10*3/uL (ref 3.6–11.0)

## 2014-01-31 LAB — COMPREHENSIVE METABOLIC PANEL
AST: 23 U/L (ref 15–37)
Albumin: 3.6 g/dL (ref 3.4–5.0)
Alkaline Phosphatase: 75 U/L
Anion Gap: 7 (ref 7–16)
BILIRUBIN TOTAL: 0.3 mg/dL (ref 0.2–1.0)
BUN: 20 mg/dL — ABNORMAL HIGH (ref 7–18)
CALCIUM: 9 mg/dL (ref 8.5–10.1)
CREATININE: 1.43 mg/dL — AB (ref 0.60–1.30)
Chloride: 109 mmol/L — ABNORMAL HIGH (ref 98–107)
Co2: 26 mmol/L (ref 21–32)
GFR CALC AF AMER: 46 — AB
GFR CALC NON AF AMER: 38 — AB
Glucose: 145 mg/dL — ABNORMAL HIGH (ref 65–99)
Osmolality: 288 (ref 275–301)
Potassium: 4.2 mmol/L (ref 3.5–5.1)
SGPT (ALT): 18 U/L
SODIUM: 142 mmol/L (ref 136–145)
Total Protein: 7.3 g/dL (ref 6.4–8.2)

## 2014-01-31 LAB — URINALYSIS, COMPLETE
BILIRUBIN, UR: NEGATIVE
Bacteria: NONE SEEN
Blood: NEGATIVE
Glucose,UR: NEGATIVE mg/dL (ref 0–75)
Ketone: NEGATIVE
Leukocyte Esterase: NEGATIVE
NITRITE: NEGATIVE
Ph: 5 (ref 4.5–8.0)
Protein: NEGATIVE
Specific Gravity: 1.011 (ref 1.003–1.030)
WBC UR: <1 /HPF (ref 0–5)

## 2014-01-31 LAB — DRUG SCREEN, URINE
Amphetamines, Ur Screen: NEGATIVE (ref ?–1000)
BARBITURATES, UR SCREEN: NEGATIVE (ref ?–200)
BENZODIAZEPINE, UR SCRN: NEGATIVE (ref ?–200)
Cannabinoid 50 Ng, Ur ~~LOC~~: NEGATIVE (ref ?–50)
Cocaine Metabolite,Ur ~~LOC~~: NEGATIVE (ref ?–300)
MDMA (ECSTASY) UR SCREEN: NEGATIVE (ref ?–500)
Methadone, Ur Screen: NEGATIVE (ref ?–300)
Opiate, Ur Screen: NEGATIVE (ref ?–300)
PHENCYCLIDINE (PCP) UR S: NEGATIVE (ref ?–25)
TRICYCLIC, UR SCREEN: NEGATIVE (ref ?–1000)

## 2014-01-31 LAB — ETHANOL

## 2014-01-31 LAB — SALICYLATE LEVEL: Salicylates, Serum: <1.7 mg/dL

## 2014-01-31 LAB — ACETAMINOPHEN LEVEL

## 2014-02-01 ENCOUNTER — Inpatient Hospital Stay: Payer: Self-pay | Admitting: Psychiatry

## 2014-02-13 ENCOUNTER — Other Ambulatory Visit: Payer: Self-pay | Admitting: Cardiology

## 2014-02-15 ENCOUNTER — Other Ambulatory Visit: Payer: Self-pay

## 2014-02-15 MED ORDER — PRAVASTATIN SODIUM 80 MG PO TABS: 80.0000 mg | ORAL_TABLET | Freq: Every day | ORAL | Status: DC

## 2014-05-15 DIAGNOSIS — K52839 Microscopic colitis, unspecified: Secondary | ICD-10-CM

## 2014-05-15 HISTORY — DX: Microscopic colitis, unspecified: K52.839

## 2014-05-18 ENCOUNTER — Other Ambulatory Visit: Payer: Self-pay | Admitting: Gastroenterology

## 2014-05-19 ENCOUNTER — Encounter (HOSPITAL_COMMUNITY): Payer: Self-pay | Admitting: *Deleted

## 2014-05-23 ENCOUNTER — Ambulatory Visit (HOSPITAL_COMMUNITY): Payer: PPO | Admitting: Anesthesiology

## 2014-05-23 ENCOUNTER — Encounter (HOSPITAL_COMMUNITY): Payer: Self-pay | Admitting: Certified Registered"

## 2014-05-23 ENCOUNTER — Ambulatory Visit (HOSPITAL_COMMUNITY)
Admission: RE | Admit: 2014-05-23 | Discharge: 2014-05-23 | Disposition: A | Discharge status: Home / Self Care | Payer: PPO | Source: Ambulatory Visit | Attending: Gastroenterology | Admitting: Gastroenterology

## 2014-05-23 ENCOUNTER — Encounter (HOSPITAL_COMMUNITY)
Admission: RE | Disposition: A | Discharge status: Home / Self Care | Payer: Self-pay | Source: Ambulatory Visit | Attending: Gastroenterology

## 2014-05-23 DIAGNOSIS — E780 Pure hypercholesterolemia: Secondary | ICD-10-CM | POA: Insufficient documentation

## 2014-05-23 DIAGNOSIS — I252 Old myocardial infarction: Secondary | ICD-10-CM | POA: Diagnosis not present

## 2014-05-23 DIAGNOSIS — M810 Age-related osteoporosis without current pathological fracture: Secondary | ICD-10-CM | POA: Insufficient documentation

## 2014-05-23 DIAGNOSIS — M1990 Unspecified osteoarthritis, unspecified site: Secondary | ICD-10-CM | POA: Insufficient documentation

## 2014-05-23 DIAGNOSIS — F329 Major depressive disorder, single episode, unspecified: Secondary | ICD-10-CM | POA: Insufficient documentation

## 2014-05-23 DIAGNOSIS — R197 Diarrhea, unspecified: Secondary | ICD-10-CM | POA: Diagnosis present

## 2014-05-23 DIAGNOSIS — E039 Hypothyroidism, unspecified: Secondary | ICD-10-CM | POA: Diagnosis not present

## 2014-05-23 DIAGNOSIS — Z951 Presence of aortocoronary bypass graft: Secondary | ICD-10-CM | POA: Diagnosis not present

## 2014-05-23 DIAGNOSIS — I2510 Atherosclerotic heart disease of native coronary artery without angina pectoris: Secondary | ICD-10-CM | POA: Insufficient documentation

## 2014-05-23 DIAGNOSIS — Z9089 Acquired absence of other organs: Secondary | ICD-10-CM | POA: Insufficient documentation

## 2014-05-23 DIAGNOSIS — G43909 Migraine, unspecified, not intractable, without status migrainosus: Secondary | ICD-10-CM | POA: Insufficient documentation

## 2014-05-23 DIAGNOSIS — K5289 Other specified noninfective gastroenteritis and colitis: Secondary | ICD-10-CM | POA: Insufficient documentation

## 2014-05-23 DIAGNOSIS — Z90710 Acquired absence of both cervix and uterus: Secondary | ICD-10-CM | POA: Insufficient documentation

## 2014-05-23 DIAGNOSIS — R1110 Vomiting, unspecified: Secondary | ICD-10-CM | POA: Diagnosis present

## 2014-05-23 HISTORY — DX: Hypothyroidism, unspecified: E03.9

## 2014-05-23 HISTORY — DX: Unspecified osteoarthritis, unspecified site: M19.90

## 2014-05-23 HISTORY — PX: ESOPHAGOGASTRODUODENOSCOPY (EGD) WITH PROPOFOL: SHX5813

## 2014-05-23 HISTORY — PX: COLONOSCOPY WITH PROPOFOL: SHX5780

## 2014-05-23 SURGERY — ESOPHAGOGASTRODUODENOSCOPY (EGD) WITH PROPOFOL
Anesthesia: Monitor Anesthesia Care

## 2014-05-23 MED ORDER — PROPOFOL 10 MG/ML IV BOLUS
INTRAVENOUS | Status: AC
  Filled 2014-05-23: qty 20

## 2014-05-23 MED ORDER — LACTATED RINGERS IV SOLN
INTRAVENOUS | Status: DC | PRN
  Administered 2014-05-23: 13:00:00 via INTRAVENOUS

## 2014-05-23 MED ORDER — PROPOFOL 10 MG/ML IV BOLUS
INTRAVENOUS | Status: DC | PRN
  Administered 2014-05-23: 50 mg via INTRAVENOUS
  Administered 2014-05-23 (×2): 100 mg via INTRAVENOUS

## 2014-05-23 SURGICAL SUPPLY — 25 items

## 2014-05-23 NOTE — H&P (Signed)
  Problems: Chronic diarrhea and regurgitation without weight loss since January 2014. 08/26/2010 CT scan abdomen showed stable pancreatic cysts. 06/17/2005 normal colonoscopy  History: The patient is a 74 year old female born [birth year]. She underwent a normal screening colonoscopy in March 2007.  She recently lost her husband suddenly and unexpectedly due to an intracerebral hemorrhage. She has sold her home and moved to an apartment.  The patient was having episodes of early morning regurgitation without nausea, vomiting, heartburn, dysphagia, odynophagia, or abdominal pain. Her symptom has resolved.  She has a history of predominantly postprandial, intermittent bowel urgency initially associated with the passage of solid stool followed by multiple episodes of liquid but nonbloody diarrhea. Her episodes occur unpredictably and she has not been able to identify in any particular foods other than salads that cause these episodes. She has not lost weight and her appetite remains normal. She chronically takes omeprazole and Zoloft which are associated with microscopic colitis.  She is scheduled to undergo esophagogastroduodenoscopy with small bowel biopsies and colonoscopy with random colon biopsies to look for microscopic colitis.  Past medical history: Depression. Hypothyroidism. Hypercholesterolemia. Migraine headache syndrome. Two-vessel coronary artery disease. Carotid artery disease. Dermatomyositis. Pancreatic cysts. Osteoporosis. Subclavian steal syndrome. Coronary artery bypass grafting surgery. Lumbar laminectomy. Total abdominal hysterectomy. Appendectomy. Shoulder surgery.  Medication allergies: Sulfa and Biaxin cause vomiting.  Exam: The patient is alert and lying comfortably on the endoscopy stretcher. Abdomen is soft and nontender to palpation. Lungs are clear to auscultation. Cardiac exam reveals a regular rhythm.  Plan: Proceed with esophagogastroduodenoscopy, small bowel biopsies,  colonoscopy, random colon biopsies to look for microscopic colitis

## 2014-05-23 NOTE — Op Note (Signed)
Problems: Chronic watery diarrhea. Regurgitation. Normal colonoscopy performed on 06/17/2005  Endoscopist: Earle Gell  Premedication: Propofol administered by anesthesia  Procedure: Diagnostic esophagogastroduodenoscopy with duodenal biopsies to look for villous atrophy associated with celiac disease The patient was placed in the left lateral decubitus position. The Pentax gastroscope was passed through the posterior hypopharynx into the proximal esophagus without difficulty. I did not visualize the vocal cords.  Esophagoscopy: The proximal, mid, and lower segments of the esophageal mucosa appear completely normal. The squamocolumnar junction is regular in appearance and noted at 36 cm from the incisor teeth.  Gastroscopy: Retroflex view of the gastric cardia and fundus was normal. The gastric body, antrum, and pylorus appeared normal  Duodenoscopy: The duodenal bulb and descending duodenum appeared normal. Biopsies were performed from the second portion of the duodenum and the duodenal bulb to look for villous atrophy associated with celiac disease.  Assessment: Normal esophagogastroduodenoscopy. Small bowel biopsies pending  Procedure: Diagnostic colonoscopy with random colon biopsies to look for microscopic colitis. Anal inspection and digital rectal exam were normal. The Pentax pediatric colonoscope was introduced into the rectum and advanced to the cecum. A normal-appearing ileocecal valve and appendiceal orifice were identified. Colonic preparation for the exam today was good. Withdrawal time was 10 minutes  Rectum. Normal. Retroflexed view of the rectum normal  Sigmoid colon and descending colon. Normal  Flexure. Normal  Transverse colon. Normal  Hepatic flexure. Normal  Ascending colon. Normal  Cecum and ileocecal valve. Normal  Biopsies: Three biopsies were taken from the ascending colon and three biopsies were taken from the descending colon to look for microscopic  colitis.  Assessment: Normal diagnostic colonoscopy. Random colon biopsies to look for microscopic colitis pending.

## 2014-05-23 NOTE — Anesthesia Preprocedure Evaluation (Addendum)
Anesthesia Evaluation  Patient identified by MRN, date of birth, ID band Patient awake    Reviewed: Allergy & Precautions, NPO status , Patient's Chart, lab work & pertinent test results  Airway Mallampati: II  TM Distance: >3 FB Neck ROM: Full    Dental no notable dental hx.    Pulmonary neg pulmonary ROS,  breath sounds clear to auscultation  Pulmonary exam normal       Cardiovascular Exercise Tolerance: Good + CAD, + Past MI and + CABG Rhythm:Regular Rate:Normal     Neuro/Psych PSYCHIATRIC DISORDERS Depression negative neurological ROS     GI/Hepatic negative GI ROS, Neg liver ROS,   Endo/Other  Hypothyroidism   Renal/GU negative Renal ROS  negative genitourinary   Musculoskeletal  (+) Arthritis -,   Abdominal   Peds negative pediatric ROS (+)  Hematology negative hematology ROS (+)   Anesthesia Other Findings   Reproductive/Obstetrics negative OB ROS                            Anesthesia Physical Anesthesia Plan  ASA: III  Anesthesia Plan: MAC   Post-op Pain Management:    Induction: Intravenous  Airway Management Planned:   Additional Equipment:   Intra-op Plan:   Post-operative Plan:   Informed Consent: I have reviewed the patients History and Physical, chart, labs and discussed the procedure including the risks, benefits and alternatives for the proposed anesthesia with the patient or authorized representative who has indicated his/her understanding and acceptance.   Dental advisory given  Plan Discussed with: CRNA  Anesthesia Plan Comments:         Anesthesia Quick Evaluation

## 2014-05-23 NOTE — Transfer of Care (Signed)
Immediate Anesthesia Transfer of Care Note  Patient: Kathleen Williamson  Procedure(s) Performed: Procedure(s) (LRB): ESOPHAGOGASTRODUODENOSCOPY (EGD) WITH PROPOFOL (N/A) COLONOSCOPY WITH PROPOFOL (N/A)  Patient Location: PACU  Anesthesia Type: MAC  Level of Consciousness: sedated, patient cooperative and responds to stimulation  Airway & Oxygen Therapy: Patient Spontanous Breathing and Patient connected to face mask oxgen  Post-op Assessment: Report given to PACU RN and Post -op Vital signs reviewed and stable  Post vital signs: Reviewed and stable  Complications: No apparent anesthesia complications

## 2014-05-23 NOTE — Discharge Instructions (Signed)
Esophagogastroduodenoscopy Care After Refer to this sheet in the next few weeks. These instructions provide you with information on caring for yourself after your procedure. Your caregiver may also give you more specific instructions. Your treatment has been planned according to current medical practices, but problems sometimes occur. Call your caregiver if you have any problems or questions after your procedure.  HOME CARE INSTRUCTIONS  Do not eat or drink anything until the numbing medicine (local anesthetic) has worn off and your gag reflex has returned. You will know that the local anesthetic has worn off when you can swallow comfortably.  Do not drive for 12 hours after the procedure or as directed by your caregiver.  Only take medicines as directed by your caregiver. SEEK MEDICAL CARE IF:   You cannot stop coughing.  You are not urinating at all or less than usual. SEEK IMMEDIATE MEDICAL CARE IF:  You have difficulty swallowing.  You cannot eat or drink.  You have worsening throat or chest pain.  You have dizziness, lightheadedness, or you faint.  You have nausea or vomiting.  You have chills.  You have a fever.  You have severe abdominal pain.  You have black, tarry, or bloody stools. Document Released: 03/17/2012 Document Reviewed: 03/17/2012 [facility] Patient Information 2015 [place]. This information is not intended to replace advice given to you by your health care provider. Make sure you discuss any questions you have with your health care provider.   Colonoscopy, Care After These instructions give you information on caring for yourself after your procedure. Your doctor may also give you more specific instructions. Call your doctor if you have any problems or questions after your procedure. HOME CARE  Do not drive for 24 hours.  Do not sign important papers or use machinery for 24 hours.  You may shower.  You may go back to your usual activities, but  go slower for the first 24 hours.  Take rest breaks often during the first 24 hours.  Walk around or use warm packs on your belly (abdomen) if you have belly cramping or gas.  Drink enough fluids to keep your pee (urine) clear or pale yellow.  Resume your normal diet. Avoid heavy or fried foods.  Avoid drinking alcohol for 24 hours or as told by your doctor.  Only take medicines as told by your doctor. If a tissue sample (biopsy) was taken during the procedure:   Do not take aspirin or blood thinners for 7 days, or as told by your doctor.  Do not drink alcohol for 7 days, or as told by your doctor.  Eat soft foods for the first 24 hours. GET HELP IF: You still have a small amount of blood in your poop (stool) 2-3 days after the procedure. GET HELP RIGHT AWAY IF:  You have more than a small amount of blood in your poop.  You see clumps of tissue (blood clots) in your poop.  Your belly is puffy (swollen).  You feel sick to your stomach (nauseous) or throw up (vomit).  You have a fever.  You have belly pain that gets worse and medicine does not help. MAKE SURE YOU:  Understand these instructions.  Will watch your condition.  Will get help right away if you are not doing well or get worse. Document Released: 05/03/2010 Document Revised: 04/05/2013 Document Reviewed: 12/06/2012 [facility] Patient Information 2015 [place], [place]. This information is not intended to replace advice given to you by your health care provider. Make  sure you discuss any questions you have with your health care provider. ° ° °Conscious Sedation, Adult, Care After °Refer to this sheet in the next few weeks. These instructions provide you with information on caring for yourself after your procedure. Your health care provider may also give you more specific instructions. Your treatment has been planned according to current medical practices, but problems sometimes occur. Call your health care provider if  you have any problems or questions after your procedure. °WHAT TO EXPECT AFTER THE PROCEDURE  °After your procedure: °· You may feel sleepy, clumsy, and have poor balance for several hours. °· Vomiting may occur if you eat too soon after the procedure. °HOME CARE INSTRUCTIONS °· Do not participate in any activities where you could become injured for at least 24 hours. Do not: °¨ Drive. °¨ Swim. °¨ Ride a bicycle. °¨ Operate heavy machinery. °¨ Cook. °¨ Use power tools. °¨ Climb ladders. °¨ Work from a high place. °· Do not make important decisions or sign legal documents until you are improved. °· If you vomit, drink water, juice, or soup when you can drink without vomiting. Make sure you have little or no nausea before eating solid foods. °· Only take over-the-counter or prescription medicines for pain, discomfort, or fever as directed by your health care provider. °· Make sure you and your family fully understand everything about the medicines given to you, including what side effects may occur. °· You should not drink alcohol, take sleeping pills, or take medicines that cause drowsiness for at least 24 hours. °· If you smoke, do not smoke without supervision. °· If you are feeling better, you may resume normal activities 24 hours after you were sedated. °· Keep all appointments with your health care provider. °SEEK MEDICAL CARE IF: °· Your skin is pale or bluish in color. °· You continue to feel nauseous or vomit. °· Your pain is getting worse and is not helped by medicine. °· You have bleeding or swelling. °· You are still sleepy or feeling clumsy after 24 hours. °SEEK IMMEDIATE MEDICAL CARE IF: °· You develop a rash. °· You have difficulty breathing. °· You develop any type of allergic problem. °· You have a fever. °MAKE SURE YOU: °· Understand these instructions. °· Will watch your condition. °· Will get help right away if you are not doing well or get worse. °Document Released: 01/19/2013 Document Reviewed:  01/19/2013 °[employer]® Patient Information ©2015 [employer]. This information is not intended to replace advice given to you by your health care provider. Make sure you discuss any questions you have with your health care provider. ° °

## 2014-05-23 NOTE — Anesthesia Postprocedure Evaluation (Signed)
  Anesthesia Post-op Note  Patient: Kathleen Williamson  Procedure(s) Performed: Procedure(s) (LRB): ESOPHAGOGASTRODUODENOSCOPY (EGD) WITH PROPOFOL (N/A) COLONOSCOPY WITH PROPOFOL (N/A)  Patient Location: PACU  Anesthesia Type: MAC  Level of Consciousness: awake and alert   Airway and Oxygen Therapy: Patient Spontanous Breathing  Post-op Pain: mild  Post-op Assessment: Post-op Vital signs reviewed, Patient's Cardiovascular Status Stable, Respiratory Function Stable, Patent Airway and No signs of Nausea or vomiting  Last Vitals:  Filed Vitals:   05/23/14 1415  BP:   Pulse: 92  Temp:   Resp: 13    Post-op Vital Signs: stable   Complications: No apparent anesthesia complications

## 2014-05-24 ENCOUNTER — Encounter (HOSPITAL_COMMUNITY): Payer: Self-pay | Admitting: Gastroenterology

## 2014-06-20 LAB — COMPLETE METABOLIC PANEL WITH GFR
ALK PHOS: 69 U/L
ALT: 10
AST: 14 U/L
BILIRUBIN TOTAL: 0.3 mg/dL
CREATININE: 1.25
GLUCOSE: 74
POTASSIUM: 4.8 mmol/L

## 2014-06-20 LAB — TSH: TSH: 1.25

## 2014-06-20 LAB — LIPID PANEL
Cholesterol: 206
HDL Cholesterol: 39
LDL CALC: 135
Triglycerides: 155

## 2014-06-20 LAB — HEMOGLOBIN A1C: A1C: 6.2

## 2014-08-05 NOTE — Consult Note (Signed)
PATIENT NAME:  Kathleen Williamson, Kathleen Williamson MR#:  [id number] DATE OF BIRTH:  [birth year]  DATE OF CONSULTATION:  02/01/2014  REFERRING PHYSICIAN:   CONSULTING PHYSICIAN:  Gonzella Lex, MD  IDENTIFYING INFORMATION AND REASON FOR CONSULTATION: A 74 year old woman, petition from [facility] because of suicidality.   CHIEF COMPLAINT: "I just don't have any reason to live."   HISTORY OF PRESENT ILLNESS: Information obtained from the patient and the chart and discussion with Dr. Octavia Heir from [facility]. The patient has had about 2 months of progressive severe depression. Her husband died suddenly a couple of months ago. She has had extremely depressed mood. Feels hopeless and helpless. Feels like she has no reason to live anymore. Has no enjoyment in life. She sleeps okay with her current medication, but her appetite has been poor. She denies hallucinations. Endorses suicidal ideation. Admits that a couple of weeks ago she tried to kill herself by overdose on prescription medicine as well as Tylenol, did not get any treatment for it. Continues to have suicidal thoughts and wishes that she were dead. She is seeing her primary care doctor only for medication and is on Zoloft 200 mg a day and clonazepam 0.5 mg b.i.d. without any benefit.   PAST PSYCHIATRIC HISTORY: Prior to her husband's death, denies that she had had any mental health problems, but then clarifies that in fact, she has been on the Zoloft since 1998. She claims that it was started because she was under a lot of "stress" at her job. Will not describe it as depression. No past suicide attempts before this most recent one. No previous psychiatric hospitalizations. No known other medications used.   SUBSTANCE ABUSE HISTORY: Does not drink, does not use drugs. No history of drugs or alcohol ever.   PAST MEDICAL HISTORY: The patient has a history of having had a coronary artery bypass graft operation, but is now apparently stable. Does not have any angina  or heart failure. Takes a low dose of Synthroid and a statin drug. Otherwise, in good physical health.   CURRENT MEDICATIONS: Zoloft 200 mg per day, clonazepam 0.5 mg b.i.d., aspirin 81 mg per day, pravastatin 80 mg at night, Synthroid 50 mcg per day.   ALLERGIES: SULFA DRUGS.   FAMILY HISTORY: No known family history of mental illness.   SOCIAL HISTORY: The patient's husband is deceased. She has no other significant family support really. She has no children. No living first-degree relatives. She is close with some cousins and has some friends and members of her church, but it sounds like overall she is pretty lonely. She wants to move in to an assisted living facility, but has not been able to do so yet because her home has not sold and she is frustrated about the process of selling the house.   REVIEW OF SYSTEMS: Sad, depressed mood. Hopelessness. No optimism for the future. No enjoyment of normal activities. Positive suicidal ideation. No hallucinations or delusions. No chest pain. No shortness of breath. No GI complaints. The rest of the physical review of systems is negative.   MENTAL STATUS EXAMINATION: Neatly dressed and groomed woman, looks her stated age or younger, cooperative with the interview. Eye contact good. Psychomotor activity sluggish. Speech is decreased in total amount but fluent. Affect is dysphoric, borderline tearful. Mood stated as depressed. Thoughts are lucid. No obvious loosening of associations. Endorses suicidal ideation. No homicidal ideation. Denies auditory or visual hallucinations. Can remember 3/3 objects immediately and 2/3 at 3 minutes. Alert and  oriented x 4. Judgment and insight somewhat impaired in that she is resistant to coming into the hospital.   LABORATORY RESULTS: Drug screen entirely negative. The chemistry panel: No significant abnormalities. Creatinine is up a little bit at 1.43, glucose up at 145. The CBC is all normal. Urinalysis all negative.    VITAL SIGNS: In the emergency room blood pressure is 139/63, respirations 18, pulse 64, temperature 98.   ASSESSMENT: This is a 74 year old woman with severe major depression with suicidal ideation. Requires hospitalization. Minimal outpatient support. Current treatment not effective. No other psychiatric treatment currently available to meet her needs.   TREATMENT PLAN: Admit to psychiatry under involuntary commitment. Diagnosis: Major depression. Suicide precautions, close precautions, fall, and elopement precautions all in place. Continue current medications for now. Psychoeducation completed. Reviewed treatment plan. She will be evaluated by the primary team downstairs who can work on further changes to medication and other treatment.   DIAGNOSIS, PRINCIPAL AND PRIMARY:  AXIS I: Major depression, single, severe.   SECONDARY DIAGNOSES: AXIS I: No other diagnosis.  AXIS II: No diagnosis.  AXIS III: History of coronary artery disease, hypothyroidism.    ____________________________ Gonzella Lex, MD jtc:at D: 02/01/2014 12:28:11 ET T: 02/01/2014 13:37:39 ET JOB#: [id number]  cc: Gonzella Lex, MD, <Dictator> Gonzella Lex MD ELECTRONICALLY SIGNED 02/02/2014 15:34

## 2014-08-05 NOTE — Discharge Summary (Signed)
PATIENT NAMEDAVIANA, Kathleen Williamson MR#:  [id number] DATE OF BIRTH:  02/23/1941  DATE OF ADMISSION:  02/01/2014 DATE OF DISCHARGE:  02/02/2014  REFERRING PHYSICIAN:  Emergency Room MD   ATTENDING PHYSICIAN:  Jerrico Covello B. Bary Leriche, MD   IDENTIFYING DATA:  Kathleen Williamson is a 74 year old female with history of depression.   CHIEF COMPLAINT:  "This is a misunderstanding."   HISTORY OF PRESENT ILLNESS:  Kathleen Williamson has a long history of mild depression and has been on the Zoloft since the 80s. At that time, her mother with Alzheimer's was in need of her help, and the patient found it rather difficult. Over the years, her Zoloft too was increased to 200 mg, and the patient felt that it was working fine. Two months ago, her husband of 33 years died suddenly of aneurysm. The patient found him. She has not been able to recover from her loss. She reports many symptoms of depression with poor sleep, decreased appetite, weight loss, anhedonia, feeling of guilt, hopelessness, worthlessness, crying spells, and social isolation. She has no children and no family to speak of. She does have some support from her cousins and friends and pastor but realizes that she has not been utilizing them appropriately. A few days ago, reportedly the patient overdosed on some medications. She slept it off. She did not come to the hospital, but she disclosed thoughts of hurting herself to her pastor and her friends, and one of her friends urged her to go to the crisis center. The patient was hoping to find support and maybe some advice on how to best handle her situation. Instead, she was placed on involuntary inpatient psychiatric commitment and brought to the Emergency Room. She was admitted to the psychiatric ward. She denied suicidal ideations or plans all along in the Emergency Room, but today she feels even more strongly about it. Her pastor and her friends visited yesterday. She feels that the conversation with the pastor gave her new  impetus to press on. The friends started plans for the weekend. She will never be alone. She will spend some time with a friend in [place], and they offered her company and will try to get her engaged in volunteering. The patient has been looking for resources and started going to church grieving group in [place]. She also has been in contact with counselors from hospice. She denies any psychotic symptoms. There are no symptoms suggestive of bipolar mania. There is no history of alcohol or illicit substance use.   PAST PSYCHIATRIC HISTORY:  As above, treated with Zoloft. Now, she believes that maybe Zoloft has been all along causing GI upset that she experiences daily. There were no other suicide attempts. No hospital admissions. She has never seen a psychiatrist.   FAMILY HISTORY:  None reported.   PAST MEDICAL HISTORY:  Status post CABG, hypothyroidism.   ALLERGIES:  SULFA DRUGS.   MEDICATIONS ON ADMISSION:  Zoloft 200 mg daily, aspirin 81 mg daily, clonazepam 0.5 mg twice daily, Synthroid 50 mcg daily, pravastatin 80 mg daily.   SOCIAL HISTORY:  She had been married for 54 years. She now intends to move to a retirement community. She was already accepted there, and they are working on renovation of her new apartment; however, it appears that she needs to sell the house and the farm before she is fully accepted. I am not sure if I understand all of the details. She lives on the farm alone and is unable to take care of the  farm. 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   REVIEW OF SYSTEMS: CONSTITUTIONAL:  No fevers or chills. Positive for some weight loss.  EYES:  No double or blurred vision.  EARS, NOSE, AND THROAT:  No hearing loss.  RESPIRATORY:  No shortness of breath or cough.  CARDIOVASCULAR:  No chest pain or orthopnea.  GASTROINTESTINAL:  No abdominal pain, nausea, vomiting, or diarrhea.  GENITOURINARY:   No incontinence or frequency.  ENDOCRINE:  No heat or cold intolerance.  LYMPHATIC:  No anemia or easy bruising.  INTEGUMENTARY:  No acne or rash.  MUSCULOSKELETAL:  No muscle or joint pain.  NEUROLOGIC:  No tingling or weakness.  PSYCHIATRIC:  See history of present illness for details.   PHYSICAL EXAMINATION: VITAL SIGNS:  Blood pressure 148/76, pulse 63, respirations 20, temperature 97.1.  GENERAL:  This is a slender, elderly female in no acute distress.  HEENT:  The pupils are equal, round, and reactive to light. Sclerae are anicteric.  NECK:  Supple. No thyromegaly.  LUNGS:  Clear to auscultation. No dullness to percussion.  HEART:  Regular rhythm and rate. No murmurs, rubs, or gallops.  ABDOMEN:  Soft, nontender, nondistended. Positive bowel sounds.  MUSCULOSKELETAL:  Normal muscle strength in all extremities.  SKIN:  No rashes or bruises.  LYMPHATIC:  No cervical adenopathy.  NEUROLOGIC:  Cranial nerves II through XII are intact.   LABORATORY DATA:  Chemistries are as follows:  Blood glucose 145, BUN 20, creatinine 1.43, sodium 142, potassium 4.2. Blood alcohol level is 0. LFTs are within normal limits. Urine toxicology screen is negative for substances. CBC is within normal limits. Urinalysis is not suggestive of urinary tract infection. Serum acetaminophen and salicylates are low.   MENTAL STATUS EXAMINATION ON ADMISSION:  The patient is alert and oriented to person, place, time, and situation. She is pleasant, polite, and cooperative. She is cool and collected. She is very well groomed and casually dressed. She maintains good eye contact. Her speech is of normal rhythm, rate, and volume. Mood is okay with full affect. Thought process is logical and goal-oriented. Thought content:  She denies thoughts of hurting herself or others. There are no delusions of paranoia. There are no auditory or visual hallucinations. Her cognition is grossly intact. Registration, recall, and short- and  long-term memory are intact. She is of above average intelligence and fund of knowledge. Her insight is fair. Her judgment may be inadequate at times.   SUICIDE RISK ASSESSMENT:  This is a patient with a long history of treated depression, stabilized on Zoloft, who became increasingly depressed, withdrawn, and suicidal following sudden death of her husband 2 months ago. She seems to feel that she already found additional resources in the community and feels that she will be safe to go home.   DIAGNOSES:  AXIS I:  Major depressive disorder, recurrent, severe without psychotic features.   AXIS II:  Deferred.   AXIS III:  Coronary artery disease status post coronary artery bypass graft, hypothyroidism.   PLAN:  The patient was admitted to [facility] unit for safety, stabilization, and medication management. She was initially placed on suicide precautions and was closely monitored for any unsafe behaviors. She underwent full psychiatric and risk assessment. She received pharmacotherapy, individual and group psychotherapy, substance abuse counseling, and support from therapeutic milieu.   1.  Suicidal ideation:  This has resolved. The patient is  able to contract for safety.  2.  Mood:  We will continue Zoloft and add Remeron 15 mg at night for depression, anxiety, sleep, and appetite.  3.  Medical:  She is to continue all other medications for coronary artery disease and hypothyroidism without change.   DISPOSITION:  She will be discharged to home. She was given multiple additional resources to use in the community. She could follow up with [facility] in [place] if she wants to. She intends to follow up with her primary care provider, who has been managing her depression all along.   ____________________________ Wardell Honour. Bary Leriche, MD jbp:nb D: 02/02/2014 21:52:13 ET T: 02/02/2014 22:49:10 ET JOB#: [id number]  cc: Sukhraj Esquivias B. Bary Leriche, MD,  <Dictator> Clovis Fredrickson MD ELECTRONICALLY SIGNED 02/20/2014 6:18

## 2014-08-05 NOTE — H&P (Signed)
PATIENT NAME:  Kathleen Williamson, Kathleen Williamson MR#:  [id number] DATE OF BIRTH:  [birth year]  DATE OF ADMISSION:  02/01/2014  REFERRING PHYSICIAN:  Emergency Room MD   ATTENDING PHYSICIAN:  Jolanta B. Bary Leriche, MD   IDENTIFYING DATA:  Ms. Bouse is a 74 year old female with history of depression.   CHIEF COMPLAINT:  "This is a misunderstanding."   HISTORY OF PRESENT ILLNESS:  Ms. Banbury has a long history of mild depression and has been on the Zoloft since the 80s. At that time, her mother with Alzheimer's was in need of her help, and the patient found it rather difficult. Over the years, her Zoloft too was increased to 200 mg, and the patient felt that it was working fine. Two months ago, her husband of 47 years died suddenly of aneurysm. The patient found him. She has not been able to recover from her loss. She reports many symptoms of depression with poor sleep, decreased appetite, weight loss, anhedonia, feeling of guilt, hopelessness, worthlessness, crying spells, and social isolation. She has no children and no family to speak of. She does have some support from her cousins and friends and pastor but realizes that she has not been utilizing them appropriately. A few days ago, reportedly the patient overdosed on some medications. She slept it off. She did not come to the hospital, but she disclosed thoughts of hurting herself to her pastor and her friends, and one of her friends urged her to go to the crisis center. The patient was hoping to find support and maybe some advice on how to best handle her situation. Instead, she was placed on involuntary inpatient psychiatric commitment and brought to the Emergency Room. She was admitted to the psychiatric ward. She denied suicidal ideations or plans all along in the Emergency Room, but today she feels even more strongly about it. Her pastor and her friends visited yesterday. She feels that the conversation with the pastor gave her new impetus to press on. The friends  started plans for the weekend. She will never be alone. She will spend some time with a friend in [place], and they offered her company and will try to get her engaged in volunteering. The patient has been looking for resources and started going to church grieving group in [place]. She also has been in contact with counselors from hospice. She denies any psychotic symptoms. There are no symptoms suggestive of bipolar mania. There is no history of alcohol or illicit substance use.   PAST PSYCHIATRIC HISTORY:  As above, treated with Zoloft. Now, she believes that maybe Zoloft has been all along causing GI upset that she experiences daily. There were no other suicide attempts. No hospital admissions. She has never seen a psychiatrist.   FAMILY HISTORY:  None reported.   PAST MEDICAL HISTORY:  Status post CABG, hypothyroidism.   ALLERGIES:  SULFA DRUGS.   MEDICATIONS ON ADMISSION:  Zoloft 200 mg daily, aspirin 81 mg daily, clonazepam 0.5 mg twice daily, Synthroid 50 mcg daily, pravastatin 80 mg daily.   SOCIAL HISTORY:  She had been married for 54 years. She now intends to move to a retirement community. She was already accepted there, and they are working on renovation of her new apartment; however, it appears that she needs to sell the house and the farm before she is fully accepted. I am not sure if I understand all of the details. She lives on the farm alone and is unable to take care of the farm. 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   REVIEW OF SYSTEMS: CONSTITUTIONAL:  No fevers or chills. Positive for some weight loss.  EYES:  No double or blurred vision.  EARS, NOSE, AND THROAT:  No hearing loss.  RESPIRATORY:  No shortness of breath or cough.  CARDIOVASCULAR:  No chest pain or orthopnea.  GASTROINTESTINAL:  No abdominal pain, nausea, vomiting, or diarrhea.  GENITOURINARY:  No incontinence or frequency.   ENDOCRINE:  No heat or cold intolerance.  LYMPHATIC:  No anemia or easy bruising.  INTEGUMENTARY:  No acne or rash.  MUSCULOSKELETAL:  No muscle or joint pain.  NEUROLOGIC:  No tingling or weakness.  PSYCHIATRIC:  See history of present illness for details.   PHYSICAL EXAMINATION: VITAL SIGNS:  Blood pressure 148/76, pulse 63, respirations 20, temperature 97.1.  GENERAL:  This is a slender, elderly female in no acute distress.  HEENT:  The pupils are equal, round, and reactive to light. Sclerae are anicteric.  NECK:  Supple. No thyromegaly.  LUNGS:  Clear to auscultation. No dullness to percussion.  HEART:  Regular rhythm and rate. No murmurs, rubs, or gallops.  ABDOMEN:  Soft, nontender, nondistended. Positive bowel sounds.  MUSCULOSKELETAL:  Normal muscle strength in all extremities.  SKIN:  No rashes or bruises.  LYMPHATIC:  No cervical adenopathy.  NEUROLOGIC:  Cranial nerves II through XII are intact.   LABORATORY DATA:  Chemistries are as follows:  Blood glucose 145, BUN 20, creatinine 1.43, sodium 142, potassium 4.2. Blood alcohol level is 0. LFTs are within normal limits. Urine toxicology screen is negative for substances. CBC is within normal limits. Urinalysis is not suggestive of urinary tract infection. Serum acetaminophen and salicylates are low.   MENTAL STATUS EXAMINATION ON ADMISSION:  The patient is alert and oriented to person, place, time, and situation. She is pleasant, polite, and cooperative. She is cool and collected. She is very well groomed and casually dressed. She maintains good eye contact. Her speech is of normal rhythm, rate, and volume. Mood is okay with full affect. Thought process is logical and goal-oriented. Thought content:  She denies thoughts of hurting herself or others. There are no delusions of paranoia. There are no auditory or visual hallucinations. Her cognition is grossly intact. Registration, recall, and short- and long-term memory are intact. She  is of above average intelligence and fund of knowledge. Her insight is fair. Her judgment may be inadequate at times.   SUICIDE RISK ASSESSMENT:  This is a patient with a long history of treated depression, stabilized on Zoloft, who became increasingly depressed, withdrawn, and suicidal following sudden death of her husband 2 months ago. She seems to feel that she already found additional resources in the community and feels that she will be safe to go home.   DIAGNOSES:  AXIS I:  Major depressive disorder, recurrent, severe without psychotic features.   AXIS II:  Deferred.   AXIS III:  Coronary artery disease status post coronary artery bypass graft, hypothyroidism.   PLAN:  The patient was admitted to [facility] unit for safety, stabilization, and medication management. She was initially placed on suicide precautions and was closely monitored for any unsafe behaviors. She underwent full psychiatric and risk assessment. She received pharmacotherapy, individual and group psychotherapy, substance abuse counseling, and support from therapeutic milieu.   1.  Suicidal ideation:  This has resolved. The patient is able to contract for safety.  2.  Mood:  We will continue Zoloft and add Remeron 15 mg at night for depression, anxiety, sleep, and appetite.  3.  Medical:  She is to continue all other medications for coronary artery disease and hypothyroidism without change.   DISPOSITION:  She will be discharged to home. She was given multiple additional resources to use in the community. She could follow up with [facility] in [place] if she wants to. She intends to follow up with her primary care provider, who has been managing her depression all along.   ____________________________ Wardell Honour. Bary Leriche, MD jbp:nb D: 02/02/2014 21:52:13 ET T: 02/02/2014 22:15:03 ET JOB#: [id number]  cc: Jolanta B. Bary Leriche, MD, <Dictator> Clovis Fredrickson  MD ELECTRONICALLY SIGNED 02/20/2014 6:18

## 2014-08-05 NOTE — Consult Note (Signed)
02/01/14 EVALUATION 74 yo wwf, brought into [facility] 01/31/14, by friends whom she was seen with, Bethena Roys and Ace Gins, she was evaluated by this Probation officer there and referred to [facility] for inpatient care on IVC   ----CHIEF COMPLAINT/REASON FOR EVALUATION---- "Depressed and grief.Marland KitchenMarland KitchenMarland KitchenI don't want to go to the hospital...."  OF PRESENTING PROBLEM---- woman w/ a long hx of depression and antidepressant medication use, lost her husband of 28 years to a brain aneurysm suddenly 2 mos ago. Since then she has been acutely depressed and entertaining suicidal thoughts since his death. She took an overdose of apap and sertraline several weeks ago "but not enough.Marland KitchenMarland KitchenI just took a whole bunch of them and went to sleep...." and has thought about suicide and had pdw frequently since. Denied any other attempts, abortive attempts, interrupted attempts. She still entertains taking an overdose, but denies andy intentions or plans currently. She however is unable to cite reasons to keep living and has regular passive death wishes. Experiencing severe loneliness, helplessness, and has no hope for the future.  MEDS: sertraline 200 mg qd (x 17 yrs, gradually increased over the years), clonazepam 0.5 mg bid (x 2 mos), asa e,c coq10, restasis, synthroid 25 mcg (x yrs), tng (no recent use), pravachol, immodium 3-4/wk  CURRENT MED PROBLEMS/REVIEW OF SYSTEMS:  PCP Dr. 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 Lost husband as above 2 mos ago. Financially stable, but she had little idea about finances as husband did it all. His estate is more or less completed. No children. Has cousins who live in [place], and is close to them, they have been helpful. Also gets help from friends above. No legal problems. Drives and has car. She is trying to sell farm and move to [facility], as she can't maintain the property and she fears things going wrong she can't handle. She can only go to [place] once she sells the house. Finds showing house is stressful, feels they invade her personal space. depressed for years, related to deaths of parents, and very stressful jobtst = 6, no insomniadaily since husband died, rarely before thatfair, but no wt lossnever had si until after husband's death, now w/ si 1-2x/wk, usually passing after she cries, abortive suicide attempt a few weeks ago as above, w/ pdw most days, no current plans, intentions, actual attempts (since od), interrupted attempts, or abortive attempts. No hx of self destructive behaviors. No recent injuries or accidents. Unable to come up w/ reasons to keep living.  ? hopelessness. + access to firearms IDEAS: no hi or behaviors either recently or in the pastnot a problem now or in the pastminimal, worries about selling her farm, no oc sx, no nightmares, but found husband unconscious, and thinks a lot about that, but denies intrusive thoughts of same, and only occasional activation, notes she had to "force hospital to keep him on a ventilator for 4 more days...." and then it was stopped despite her wishesloses temper rarely; no hx of violence, violence arrests,  no school violencenot a problem now or in the pastsometimes feels husband's presence since he died, "but I'm not positive on that...." otherwise no delusions either now or in the pastpoor, able to maintain hygiene, does some chores, "what I have to to keep the house  clean so those people can come in and snoop..." harder to enjoy things, but does enjoy going out w/ friends, does little elses, stopped hobby of making xmas ornaments, doesn't read much any more, but does puzzles ABUSE: never drank etoh, or drugs, doesn't smoke, moderate caffeinenone EFFECTS: none, but chronic diarrhea may be related to sertraline  EFFECTIVENESS: "I think so, I can't imagine what I'd be like without it..."   ----PAST HISTORY---- PSYCHIATRIC HISTORY: HOSPITALIZATIONS none; SUICIDE ATTEMPTS x 1 several weeks ago; PSYCHOTROPIC MEDICATIONS recalls diazepam "because of the migraines..." ; SUBSTANCE ABUSE none; OUTPATIENT TREATMENT none; PAST DEPRESSIVE/PSYCHOTIC EPISODES chronically depressed, since mo died "I did everything for her and was working..."  MEDICAL HISTORY: ILLNESSES/SURGERIES cabag, s/p mi, hypothyroid, hx of migraine, dermatomyositis, TAH and appendectomy in '97, shoulder surgery, oral surgery ; HEAD TRAUMAS none; SEIZURES none; ALLERGIES sulfa; ADVERSE DRUG REACTIONS none DEVELOPMENTAL AND SOCIAL HISTORY: BIRTH ORDER only child; FAMILY HISTORY raised by parents, neg fh for suicide, psych hosps, subst abuse; TRAUMA physical punishment, no sexual abuse, no parental violence; SCHOOL grad hs, good [employer]; 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 THOUGHT PROCESS normal associations, w/out perseveration or inability to  think abstractly. ASSOCIATIONS without tangentialness, circumstantiality, looseness or flight of ideas. PERCEPTIONS w/out hallucinations, illusions or perceptual distortions. THOUGHT CONTENT w/out abnormal thoughts, delusions or obsessions. + suicidal ideas as above w suicidal attempt a severeal weeks ago, no homicidal or violent  ideas or preoccupations. JUDGEMENT intact. INSIGHT present. ORIENTATION x 4. MEMORY grossly intact for short and long term. ATTENTION/CONCENTRATION grossly intact. LANGUAGE appropriate use of words and prosody. FUND OF KNOWLEDGE average.   ----MEDICAL DECISION MAKING---- REVIEWED: csrs, intake records, interviewed friends  RISK ASSESSMENT: (0 LOW, + HIGH)ATTEMPT +; IMPULSIVITY 0; AGGRESSIVENESS/HI 0; IDEATION/INTENT/PLAN +; DX +; RECENT LOSSES +; HOMELESSNESS 0; PENDING INCARCERATION 0; SOCIAL ISOLATION 0; PANIC 0; ANXIETY/TURMOIL/AGITATION +; MOOD INSTABILITY +; ANHEDONIA +; HOPELESSNESS +/0; INSOMNIA 0, ENERGY +; TX ALLIANCE +; VOCATIONAL LOSS +; PHYSICAL/PAIN +; PRIOR ATTEMPTS 0; SUBSTANCE ABUSE 0; COGNITIVE COMPETENCE 0; FAMILY HX OF SUICIDE 0; CHILDHOOD ABUSE +; SINGLE +; FEELS BURDEN TO OTHERS 0; FIREARMS +; STRESSORS +; AGE +; GENDER 0; VERACITY +; ACCESS TO TREATMENT ISSUES +; REFUSES TO OR IS UNABLE TO AGREE TO A SAFETY PLAN 0 RISK POTECTIVE FACTORS: (0=ABSENT, + PRESENT)REASONS FOR LIVING 0; RESPONSIBLE TO FAMILY AND OTHERS 0; SURVIVAL AND COPING SKILLS +; CHILDREN 0; FEAR OF SUICIDE/DYING 0; FEAR OF PAIN OR SUFFERING 0; BELIEF SUICIDE IS IMMORAL 0;  FEAR OF SOCIAL DISAPPROVAL 0; HIGH SPIRITUALITY +; ENGAGED IN WORK OR SCHOOL 0; PETS +; HOPEFUL ABOUT FUTURE 0; MARRIED 0; TX ALLIANCE 0 TERM RISK mod; SHORT TERM RISK high  MDD, severe, recurrents/p CABAG, hypothyroid, hld, hx of dermatomyositis and migraines supportive psychotherapy provided. PLAN/RATIONALE FOR TREATMENT PLAN: She presented with severe grief and depression following the sudden death of husband 2 mos  ago. She attempted suicide 6 weeks ago, telling no one about her failed overdose until several weeks later. She doesn't want to be hospitalized, but I think she is receiving minimal benefit from current meds, and will need to be transitioned to another antidepressant. This would be too risky to do on an outpatient basis -- despite her assertion she won't try suicide again -- given her ongoing suicidal ideas, several other suicide risk factors, and the lack of many protective factors. She is at high risk of dying from suicide and so she was referred to [facility] on IVC.  verbal, intelligent no children, struggling to maintain and sell farm and house                          Adrian Blackwater. Octavia Heir, M.D.     (Wednesday, Feb 01, 2014 10:05 AM)  Electronic Signatures: Sharee Holster (MD)  (Signed on 21-Oct-15 10:07)  Authored  Last Updated: 21-Oct-15 10:07 by Sharee Holster (MD)

## 2014-10-17 ENCOUNTER — Telehealth: Payer: Self-pay | Admitting: Internal Medicine

## 2014-10-17 NOTE — Telephone Encounter (Signed)
Pt wants to establish with Dr Darnell Level. She states that her dr at [facility] is no longer practicing. She also needs a form filled out for [facility] as she is moving there.  She needs appt and for within 2 weeks. Is there any way that she can be worked in for a new pt appt in that time with Dr Darnell Level?

## 2014-10-17 NOTE — Telephone Encounter (Signed)
Ok to do. May place in 30 min slot noon July 14th. Thanks.

## 2014-10-18 NOTE — Telephone Encounter (Signed)
Scheduled 7/14, pt aware

## 2014-10-26 ENCOUNTER — Ambulatory Visit (INDEPENDENT_AMBULATORY_CARE_PROVIDER_SITE_OTHER): Payer: PPO | Admitting: Family Medicine

## 2014-10-26 ENCOUNTER — Encounter: Payer: Self-pay | Admitting: Family Medicine

## 2014-10-26 VITALS — BP 120/58 | HR 87 | Temp 98.1°F | Ht 63.75 in | Wt 137.0 lb

## 2014-10-26 DIAGNOSIS — K52839 Microscopic colitis, unspecified: Secondary | ICD-10-CM | POA: Insufficient documentation

## 2014-10-26 DIAGNOSIS — M3390 Dermatopolymyositis, unspecified, organ involvement unspecified: Secondary | ICD-10-CM | POA: Diagnosis not present

## 2014-10-26 DIAGNOSIS — E039 Hypothyroidism, unspecified: Secondary | ICD-10-CM | POA: Insufficient documentation

## 2014-10-26 DIAGNOSIS — M810 Age-related osteoporosis without current pathological fracture: Secondary | ICD-10-CM | POA: Diagnosis not present

## 2014-10-26 DIAGNOSIS — E7800 Pure hypercholesterolemia, unspecified: Secondary | ICD-10-CM

## 2014-10-26 DIAGNOSIS — I2510 Atherosclerotic heart disease of native coronary artery without angina pectoris: Secondary | ICD-10-CM

## 2014-10-26 DIAGNOSIS — K529 Noninfective gastroenteritis and colitis, unspecified: Secondary | ICD-10-CM

## 2014-10-26 DIAGNOSIS — F4323 Adjustment disorder with mixed anxiety and depressed mood: Secondary | ICD-10-CM

## 2014-10-26 DIAGNOSIS — I25810 Atherosclerosis of coronary artery bypass graft(s) without angina pectoris: Secondary | ICD-10-CM

## 2014-10-26 NOTE — Assessment & Plan Note (Signed)
Attributed to high SSRI dose, has decreased dose. Controlled on pepto bismol and immodium daily.

## 2014-10-26 NOTE — Assessment & Plan Note (Addendum)
Check FLP at [facility]. Complaint with pravastatin 80mg  nightly.

## 2014-10-26 NOTE — Patient Instructions (Addendum)
Good to meet you today, call us with questions. Return at your convenience for medicare wellness visit. Please keep nitro with you. Let us know right away or Dr Marlou Porch if any recurrent chest discomfort.

## 2014-10-26 NOTE — Assessment & Plan Note (Signed)
Continue levothyroxine dose. Check TFTs next labwork in 2-3 mo.

## 2014-10-26 NOTE — Assessment & Plan Note (Signed)
Currently asxs.

## 2014-10-26 NOTE — Progress Notes (Signed)
BP 120/58 mmHg  Pulse 87  Temp(Src) 98.1 F (36.7 C) (Oral)  Ht 5' 3.75" (1.619 m)  Wt 137 lb (62.143 kg)  BMI 23.71 kg/m2   CC: new pt to establish care  Subjective:    Patient ID: Kathleen Williamson, female    DOB: 1940/10/12, 74 y.o.   MRN: [id number]  HPI: Kathleen Williamson is a 74 y.o. female presenting on 10/26/2014 for Establish Care   Prior saw [facility] MD Dr Michail Sermon, prior to this Dr Maxwell Caul. Planning on moving to [facility] next month 8/25.Marland Kitchen   Hypothyroid - on levothyroxine 81mcg daily except for sundays (doesn't take on Sundays).   Depression - on zoloft 200mg  daily longterm and remeron 15mg  nightly. Occasional diarrhea controlled with immodium. In process of selling house, increased stress. Husband suddenly passed away 06-19-22 yr ago 09-17-2013. Down to zoloft 100mg  daily, feels stable on this dose.   EGD/colonoscopy 05/2014 - esophageal irritation possibly related to high dose zoloft. rec immodium or pepto bismol. This is controlling diarrhea.   CAD s/p CABG 2003. Had one episode of chest pain with radiation to neck 4-6 wks ago while driving. Resolved with nitro. Denies dyspnea recently or nausea or other anginal symptom. No regular aerobic exercise.   Preventative: Last CPE 04/2012  Last well woman with pap 03/2014 ([facility])  Tetanus 2008  Flu yearly   Lives alone on farm house - moving to [place]. Husband of 34 yrs died suddenly 2013-10-17. No children, no siblings.  Close friends Bethena Roys and Ace Gins nearby, cousin in [place]. [place]: [place] [facility] [place]  Relevant past medical, surgical, family and social history reviewed and updated as indicated. Interim medical history since our last visit reviewed. Allergies and medications reviewed and updated. Current Outpatient Prescriptions on File Prior to Visit  Medication Sig  . aspirin EC 81 MG tablet Take 81 mg by mouth at bedtime.  . Cholecalciferol (VITAMIN D PO) Take 2,000 Units by mouth daily with breakfast.   . clonazePAM  (KLONOPIN) 0.5 MG tablet Take 0.25 mg by mouth daily.   . Coenzyme Q10 (COQ10) 100 MG CAPS Take 1 capsule by mouth at bedtime.   . cycloSPORINE (RESTASIS) 0.05 % ophthalmic emulsion Place 1 drop into both eyes 2 (two) times daily.   Marland Kitchen levothyroxine (SYNTHROID, LEVOTHROID) 50 MCG tablet Take 50 mcg by mouth See admin instructions. Takes everyday except for Sunday's  . mirtazapine (REMERON) 15 MG tablet Take 15 mg by mouth at bedtime.  . nitroGLYCERIN (NITROLINGUAL) 0.4 MG/SPRAY spray Place 1 spray under the tongue every 5 (five) minutes as needed. (Patient taking differently: Place 1 spray under the tongue every 5 (five) minutes as needed for chest pain. )  . sertraline (ZOLOFT) 100 MG tablet Take 100 mg by mouth daily with breakfast.    No current facility-administered medications on file prior to visit.    Review of Systems Per HPI unless specifically indicated above     Objective:    BP 120/58 mmHg  Pulse 87  Temp(Src) 98.1 F (36.7 C) (Oral)  Ht 5' 3.75" (1.619 m)  Wt 137 lb (62.143 kg)  BMI 23.71 kg/m2  Wt Readings from Last 3 Encounters:  10/26/14 137 lb (62.143 kg)  12/13/13 126 lb (57.153 kg)  08/29/11 131 lb 14.4 oz (59.829 kg)    Physical Exam  Constitutional: She appears well-developed and well-nourished. No distress.  HENT:  Mouth/Throat: Oropharynx is clear and moist. No oropharyngeal exudate.  Eyes: Conjunctivae and EOM are  normal. Pupils are equal, round, and reactive to light. No scleral icterus.  Neck: Normal range of motion. Neck supple.  Cardiovascular: Normal rate, regular rhythm, normal heart sounds and intact distal pulses.   No murmur heard. Pulmonary/Chest: Effort normal and breath sounds normal. No respiratory distress. She has no wheezes. She has no rales.  Musculoskeletal: She exhibits no edema.  Lymphadenopathy:    She has no cervical adenopathy.  Skin: Skin is warm and dry. No rash noted.  Psychiatric: She has a normal mood and affect.  Nursing  note and vitals reviewed.     Assessment & Plan:   Problem List Items Addressed This Visit    Adjustment disorder with mixed anxiety and depressed mood    Continue sertraline 100mg  daily along with remeron.      Atherosclerosis of native coronary artery of native heart without angina pectoris    Followed regularly by Dr Marlou Porch. Isolated episode of chest discomfort while driving resolved with nitro >1 mo ago, no more episodes since. Encouraged she f/u with cardiology, avoid aerobic exercise until seen by him.  She keeps nitro on hand and will let us know immediately or seek urgent care if any recurrent discomfort.      Relevant Medications   pravastatin (PRAVACHOL) 80 MG tablet   Chronic diarrhea    Attributed to high SSRI dose, has decreased dose. Controlled on pepto bismol and immodium daily.      Dermatomyositis    Currently asxs.      Hypothyroidism - Primary    Continue levothyroxine dose. Check TFTs next labwork in 2-3 mo.      Osteoporosis    Will await records from prior PCP.      Pure hypercholesterolemia    Check FLP at CPE. Complaint with pravastatin 80mg  nightly.      Relevant Medications   pravastatin (PRAVACHOL) 80 MG tablet    Other Visit Diagnoses    Coronary artery disease involving coronary bypass graft of native heart without angina pectoris        Relevant Medications    pravastatin (PRAVACHOL) 80 MG tablet        Follow up plan: Return in about 3 months (around 01/26/2015), or as needed, for medicare wellness visit.

## 2014-10-26 NOTE — Assessment & Plan Note (Signed)
Continue sertraline 100mg  daily along with remeron.

## 2014-10-26 NOTE — Assessment & Plan Note (Signed)
Will await records from prior PCP.

## 2014-10-26 NOTE — Progress Notes (Signed)
Pre visit review using our clinic review tool, if applicable. No additional management support is needed unless otherwise documented below in the visit note. 

## 2014-10-26 NOTE — Assessment & Plan Note (Signed)
Followed regularly by Dr Marlou Porch. Isolated episode of chest discomfort while driving resolved with nitro >1 mo ago, no more episodes since. Encouraged she f/u with cardiology, avoid aerobic exercise until seen by him.  She keeps nitro on hand and will let us know immediately or seek urgent care if any recurrent discomfort.

## 2014-12-22 ENCOUNTER — Other Ambulatory Visit: Payer: Self-pay

## 2014-12-22 MED ORDER — CLONAZEPAM 0.5 MG PO TABS: 0.5000 mg | ORAL_TABLET | Freq: Two times a day (BID) | ORAL | Status: DC | PRN

## 2014-12-22 NOTE — Telephone Encounter (Signed)
Pt left v/m requesting refill clonazepam to [facility]. Pt established on 10/26/14. Pt request cb with status of refill.

## 2014-12-22 NOTE — Telephone Encounter (Signed)
Sent in. Tried to notify patient, mailbox full.

## 2014-12-25 ENCOUNTER — Other Ambulatory Visit: Payer: Self-pay | Admitting: *Deleted

## 2015-01-21 ENCOUNTER — Encounter: Payer: Self-pay | Admitting: Family Medicine

## 2015-01-21 ENCOUNTER — Other Ambulatory Visit: Payer: Self-pay | Admitting: Family Medicine

## 2015-01-21 DIAGNOSIS — N183 Chronic kidney disease, stage 3 unspecified: Secondary | ICD-10-CM | POA: Insufficient documentation

## 2015-01-21 DIAGNOSIS — N289 Disorder of kidney and ureter, unspecified: Secondary | ICD-10-CM

## 2015-01-21 DIAGNOSIS — E1122 Type 2 diabetes mellitus with diabetic chronic kidney disease: Secondary | ICD-10-CM | POA: Insufficient documentation

## 2015-01-21 DIAGNOSIS — M810 Age-related osteoporosis without current pathological fracture: Secondary | ICD-10-CM

## 2015-01-21 DIAGNOSIS — E7800 Pure hypercholesterolemia, unspecified: Secondary | ICD-10-CM

## 2015-01-21 DIAGNOSIS — K863 Pseudocyst of pancreas: Secondary | ICD-10-CM

## 2015-01-21 DIAGNOSIS — E039 Hypothyroidism, unspecified: Secondary | ICD-10-CM

## 2015-01-22 ENCOUNTER — Ambulatory Visit (INDEPENDENT_AMBULATORY_CARE_PROVIDER_SITE_OTHER): Payer: PPO

## 2015-01-22 ENCOUNTER — Other Ambulatory Visit (INDEPENDENT_AMBULATORY_CARE_PROVIDER_SITE_OTHER): Payer: PPO

## 2015-01-22 ENCOUNTER — Other Ambulatory Visit: Payer: Self-pay | Admitting: Family Medicine

## 2015-01-22 ENCOUNTER — Encounter: Payer: Self-pay | Admitting: Radiology

## 2015-01-22 DIAGNOSIS — E7800 Pure hypercholesterolemia, unspecified: Secondary | ICD-10-CM | POA: Diagnosis not present

## 2015-01-22 DIAGNOSIS — N289 Disorder of kidney and ureter, unspecified: Secondary | ICD-10-CM

## 2015-01-22 DIAGNOSIS — Z23 Encounter for immunization: Secondary | ICD-10-CM

## 2015-01-22 DIAGNOSIS — K863 Pseudocyst of pancreas: Secondary | ICD-10-CM

## 2015-01-22 DIAGNOSIS — M810 Age-related osteoporosis without current pathological fracture: Secondary | ICD-10-CM | POA: Diagnosis not present

## 2015-01-22 DIAGNOSIS — E039 Hypothyroidism, unspecified: Secondary | ICD-10-CM | POA: Diagnosis not present

## 2015-01-22 LAB — COMPREHENSIVE METABOLIC PANEL
ALT: 10 U/L (ref 0–35)
AST: 16 U/L (ref 0–37)
Albumin: 4.3 g/dL (ref 3.5–5.2)
Alkaline Phosphatase: 61 U/L (ref 39–117)
BUN: 23 mg/dL (ref 6–23)
CHLORIDE: 107 meq/L (ref 96–112)
CO2: 26 meq/L (ref 19–32)
CREATININE: 1.28 mg/dL — AB (ref 0.40–1.20)
Calcium: 9.9 mg/dL (ref 8.4–10.5)
GFR: 43.44 mL/min — ABNORMAL LOW (ref >60.00)
Glucose, Bld: 130 mg/dL — ABNORMAL HIGH (ref 70–99)
Potassium: 4.9 mEq/L (ref 3.5–5.1)
SODIUM: 141 meq/L (ref 135–145)
Total Bilirubin: 0.4 mg/dL (ref 0.2–1.2)
Total Protein: 7.4 g/dL (ref 6.0–8.3)

## 2015-01-22 LAB — CBC WITH DIFFERENTIAL/PLATELET
BASOS ABS: 0 10*3/uL (ref 0.0–0.1)
Basophils Relative: 0.5 % (ref 0.0–3.0)
EOS ABS: 0.3 10*3/uL (ref 0.0–0.7)
Eosinophils Relative: 4.5 % (ref 0.0–5.0)
HEMATOCRIT: 38.9 % (ref 36.0–46.0)
Hemoglobin: 13 g/dL (ref 12.0–15.0)
Lymphocytes Relative: 34.3 % (ref 12.0–46.0)
Lymphs Abs: 2.2 10*3/uL (ref 0.7–4.0)
MCHC: 33.3 g/dL (ref 30.0–36.0)
MCV: 86 fl (ref 78.0–100.0)
MONOS PCT: 6.1 % (ref 3.0–12.0)
Monocytes Absolute: 0.4 10*3/uL (ref 0.1–1.0)
Neutro Abs: 3.5 10*3/uL (ref 1.4–7.7)
Neutrophils Relative %: 54.6 % (ref 43.0–77.0)
Platelets: 234 10*3/uL (ref 150.0–400.0)
RBC: 4.53 Mil/uL (ref 3.87–5.11)
RDW: 14.1 % (ref 11.5–15.5)
WBC: 6.3 10*3/uL (ref 4.0–10.5)

## 2015-01-22 LAB — LIPID PANEL
CHOL/HDL RATIO: 5
Cholesterol: 198 mg/dL (ref 0–200)
HDL: 41.4 mg/dL (ref >39.00)
LDL Cholesterol: 128 mg/dL — ABNORMAL HIGH (ref 0–99)
NonHDL: 156.77
TRIGLYCERIDES: 142 mg/dL (ref 0.0–149.0)
VLDL: 28.4 mg/dL (ref 0.0–40.0)

## 2015-01-22 LAB — TSH: TSH: 2.04 u[IU]/mL (ref 0.35–4.50)

## 2015-01-22 LAB — MICROALBUMIN / CREATININE URINE RATIO
Creatinine,U: 93.3 mg/dL
MICROALB UR: 0.7 mg/dL (ref 0.0–1.9)
Microalb Creat Ratio: 0.8 mg/g (ref 0.0–30.0)

## 2015-01-22 LAB — VITAMIN D 25 HYDROXY (VIT D DEFICIENCY, FRACTURES): VITD: 51.96 ng/mL (ref 30.00–100.00)

## 2015-01-22 NOTE — Telephone Encounter (Signed)
Last filled #[id number] 12/22/14

## 2015-01-22 NOTE — Telephone Encounter (Signed)
plz phone in. 

## 2015-01-23 ENCOUNTER — Other Ambulatory Visit: Payer: Self-pay | Admitting: Family Medicine

## 2015-01-23 NOTE — Telephone Encounter (Signed)
Rx called in as directed.   

## 2015-01-29 ENCOUNTER — Ambulatory Visit: Payer: Self-pay | Admitting: Family Medicine

## 2015-01-31 ENCOUNTER — Encounter: Payer: Self-pay | Admitting: Family Medicine

## 2015-01-31 ENCOUNTER — Ambulatory Visit (INDEPENDENT_AMBULATORY_CARE_PROVIDER_SITE_OTHER): Payer: PPO | Admitting: Family Medicine

## 2015-01-31 VITALS — BP 118/60 | HR 68 | Temp 97.5°F | Ht 63.75 in | Wt 141.8 lb

## 2015-01-31 DIAGNOSIS — K52839 Microscopic colitis, unspecified: Secondary | ICD-10-CM

## 2015-01-31 DIAGNOSIS — E2839 Other primary ovarian failure: Secondary | ICD-10-CM

## 2015-01-31 DIAGNOSIS — I2510 Atherosclerotic heart disease of native coronary artery without angina pectoris: Secondary | ICD-10-CM

## 2015-01-31 DIAGNOSIS — M3390 Dermatopolymyositis, unspecified, organ involvement unspecified: Secondary | ICD-10-CM

## 2015-01-31 DIAGNOSIS — E039 Hypothyroidism, unspecified: Secondary | ICD-10-CM

## 2015-01-31 DIAGNOSIS — I6529 Occlusion and stenosis of unspecified carotid artery: Secondary | ICD-10-CM | POA: Insufficient documentation

## 2015-01-31 DIAGNOSIS — N183 Chronic kidney disease, stage 3 unspecified: Secondary | ICD-10-CM

## 2015-01-31 DIAGNOSIS — M3313 Other dermatomyositis without myopathy: Secondary | ICD-10-CM

## 2015-01-31 DIAGNOSIS — Z7189 Other specified counseling: Secondary | ICD-10-CM

## 2015-01-31 DIAGNOSIS — I6523 Occlusion and stenosis of bilateral carotid arteries: Secondary | ICD-10-CM

## 2015-01-31 DIAGNOSIS — Z0000 Encounter for general adult medical examination without abnormal findings: Secondary | ICD-10-CM

## 2015-01-31 DIAGNOSIS — E7800 Pure hypercholesterolemia, unspecified: Secondary | ICD-10-CM

## 2015-01-31 DIAGNOSIS — M810 Age-related osteoporosis without current pathological fracture: Secondary | ICD-10-CM

## 2015-01-31 DIAGNOSIS — F4323 Adjustment disorder with mixed anxiety and depressed mood: Secondary | ICD-10-CM

## 2015-01-31 DIAGNOSIS — I6509 Occlusion and stenosis of unspecified vertebral artery: Secondary | ICD-10-CM

## 2015-01-31 NOTE — Addendum Note (Signed)
Addended by: [name] on: 01/31/2015 10:02 AM   Modules accepted: Miquel Dunn

## 2015-01-31 NOTE — Assessment & Plan Note (Signed)
Update dexa. Never received records from prior PCP

## 2015-01-31 NOTE — Patient Instructions (Addendum)
Ask up front to resend release of information form to [facility].  Call to schedule mammogram at [facility] and ask them to send me a copy of results. We will schedule bone density scan. Return as needed or in 6 months for follow up visit. Advanced directive packet provided today.

## 2015-01-31 NOTE — Assessment & Plan Note (Signed)
asxs. 

## 2015-01-31 NOTE — Assessment & Plan Note (Addendum)
Chronic, stable on zoloft 100mg , remeron 15mg  and klonopin 0.5mg  bid. Discussed changing klonopin to BID PRN. Counseling made depression worse so she stopped this (saw hospice counselor).

## 2015-01-31 NOTE — Assessment & Plan Note (Signed)

## 2015-01-31 NOTE — Progress Notes (Signed)
Pre visit review using our clinic review tool, if applicable. No additional management support is needed unless otherwise documented below in the visit note. 

## 2015-01-31 NOTE — Addendum Note (Signed)
Addended by: [name] on: 01/31/2015 11:14 AM   Modules accepted: Miquel Dunn

## 2015-01-31 NOTE — Assessment & Plan Note (Signed)
LDL not at goal <100 despite pravastatin 80mg  daily. Pt will f/u with Dr Marlou Porch

## 2015-01-31 NOTE — Assessment & Plan Note (Signed)
Chronic, stable. Continue current regimen. 

## 2015-01-31 NOTE — Assessment & Plan Note (Addendum)
Will continue to monitor and work towards good BP and HLD control. CT angio chest/aorta IMPRESSION: High grade stenosis or focal occlusion at the origin of left vertebral artery. This finding accounts for the abnormal signal identified on the recent carotid duplex examination. There is atherosclerotic disease in the left subclavian artery but no evidence for a critical stenosis or dissection involving the left subclavian artery. Great vessels are patent. Postsurgical changes consistent with a CABG. Electronically Signed  By: Markus Daft M.D.  On: 05/17/2013 16:22

## 2015-01-31 NOTE — Progress Notes (Addendum)
BP 118/60 mmHg  Pulse 68  Temp(Src) 97.5 F (36.4 C) (Oral)  Ht 5' 3.75" (1.619 m)  Wt 141 lb 12 oz (64.297 kg)  BMI 24.53 kg/m2   CC: medicare wellness visit  Subjective:    Patient ID: Kathleen Williamson, female    DOB: [birth year], 74 y.o.   MRN: [id number]  HPI: Kathleen Williamson is a 74 y.o. female presenting on 01/31/2015 for Annual Exam   Dermatomyositis - no recent sxs. Prior saw Dr Estanislado Pandy.  Hearing screen - passed Vision screen - with eye center yearly Fall risk screen - passed Depression screen - passed. On zoloft 100mg  and remeron 15mg  nightly. She also takes klonopin 0.5mg  bid - on this for the last year.   Preventative: Colonoscopy 05/23/2014 microscopic colitis attributed to zoloft (Dr Wynetta Emery at [place]) Lung cancer screening - never smoker Breast cancer screening - due, done at [facility] Well woman with pap 03/2014 ([place]) - will not return. S/p complete hysterectomy with oophorectomy for endometriosis.  DEXA scan - remotely. Had been recommended rpt by prior PCP. Will schedule.  Flu shot - yearly Td - 2008 Pneumonia shot - 2008 Shingles shot - 2011 Advanced directive discussion - in process of setting this up with attorney. Will bring [name] copy. Packet provided today. Seat belt use discussed Sunscreen use and skin screen discussed   Lives alone at [facility]. Husband of 42 yrs died suddenly Sep 28, 2013.  No children, no siblings.  Close friends Bethena Roys and Ace Gins nearby, cousin in [place]. [facility] [facility]  Relevant past medical, surgical, family and social history reviewed and updated as indicated. Interim medical history since our last visit reviewed. Allergies and medications reviewed and updated. Current Outpatient Prescriptions on File Prior to Visit  Medication Sig  . aspirin EC 81 MG tablet Take 81 mg by mouth at bedtime.  . Cholecalciferol (VITAMIN D PO) Take 2,000 Units by mouth daily with breakfast.   . clonazePAM (KLONOPIN) 0.5 MG tablet TAKE 1  TABLET BY MOUTH TWICE A DAY AS NEEDED  . Coenzyme Q10 (COQ10) 100 MG CAPS Take 1 capsule by mouth at bedtime.   . cycloSPORINE (RESTASIS) 0.05 % ophthalmic emulsion Place 1 drop into both eyes 2 (two) times daily.   Marland Kitchen levothyroxine (SYNTHROID, LEVOTHROID) 50 MCG tablet Take 50 mcg by mouth See admin instructions. Takes everyday except for Sunday's  . loperamide (IMODIUM A-D) 2 MG tablet Take 2 mg by mouth every morning.  . mirtazapine (REMERON) 15 MG tablet Take 15 mg by mouth at bedtime.  . pravastatin (PRAVACHOL) 80 MG tablet Take 1 tablet (80 mg total) by mouth at bedtime.  . sertraline (ZOLOFT) 100 MG tablet Take 100 mg by mouth daily with breakfast.   . lactobacillus acidophilus (BACID) TABS tablet Take 2 tablets by mouth as needed.  . nitroGLYCERIN (NITROLINGUAL) 0.4 MG/SPRAY spray Place 1 spray under the tongue every 5 (five) minutes as needed. (Patient not taking: Reported on 01/31/2015)   No current facility-administered medications on file prior to visit.   Past Medical History  Diagnosis Date  . Osteoporosis     DEXA   . CAD (coronary artery disease) 2003    MI s/p 5v CABG  . Hypothyroidism   . Depression   . Arthritis     fingers  . History of chicken pox   . History of measles   . History of migraine none since 2003  . HLD (hyperlipidemia)   . Dermatomyositis ([place])  saw Dr Estanislado Pandy, improved on its own  . Urine incontinence   . Pancreatic cyst   . Microscopic colitis 05/2014    by colonoscopy    Past Surgical History  Procedure Laterality Date  . Lump removed from breast Left 1977    benign  . Back surgery  1981    lower  . Rotator cuff repair Right 1990's  . Total abdominal hysterectomy w/ bilateral salpingoophorectomy  1997    endometriosis  . Appendectomy  1997    with hysterectomy  . Coronary artery bypass graft  2003    x 5 v  . Tooth extraction  yrs ago  . Esophagogastroduodenoscopy (egd) with propofol N/A 05/23/2014    WNL Garlan Fair, MD  .  Colonoscopy with propofol N/A 05/23/2014    microscopic colitis - Garlan Fair, MD    Review of Systems Per HPI unless specifically indicated above     Objective:    BP 118/60 mmHg  Pulse 68  Temp(Src) 97.5 F (36.4 C) (Oral)  Ht 5' 3.75" (1.619 m)  Wt 141 lb 12 oz (64.297 kg)  BMI 24.53 kg/m2  Wt Readings from Last 3 Encounters:  01/31/15 141 lb 12 oz (64.297 kg)  10/26/14 137 lb (62.143 kg)  12/13/13 126 lb (57.153 kg)    Physical Exam  Constitutional: She is oriented to person, place, and time. She appears well-developed and well-nourished. No distress.  HENT:  Head: Normocephalic and atraumatic.  Right Ear: Hearing, tympanic membrane, external ear and ear canal normal.  Left Ear: Hearing, tympanic membrane, external ear and ear canal normal.  Nose: Nose normal.  Mouth/Throat: Uvula is midline, oropharynx is clear and moist and mucous membranes are normal. No oropharyngeal exudate, posterior oropharyngeal edema or posterior oropharyngeal erythema.  Eyes: Conjunctivae and EOM are normal. Pupils are equal, round, and reactive to light. No scleral icterus.  Neck: Normal range of motion. Neck supple. Carotid bruit is present (right sided). No thyromegaly present.  Cardiovascular: Normal rate, regular rhythm, normal heart sounds and intact distal pulses.   No murmur heard. Pulses:      Radial pulses are 2+ on the right side, and 2+ on the left side.  Pulmonary/Chest: Effort normal and breath sounds normal. No respiratory distress. She has no wheezes. She has no rales.  Breast - deferred, done at breast center  Abdominal: Soft. Bowel sounds are normal. She exhibits no distension and no mass. There is no tenderness. There is no rebound and no guarding.  Genitourinary:  GYN - declines  Musculoskeletal: Normal range of motion. She exhibits no edema.  Lymphadenopathy:    She has no cervical adenopathy.  Neurological: She is alert and oriented to person, place, and time.  CN  grossly intact, station and gait intact Recall 3/3 Calculation 4/5 serial 7s  Skin: Skin is warm and dry. No rash noted.  Psychiatric: She has a normal mood and affect. Her behavior is normal. Judgment and thought content normal.  Nursing note and vitals reviewed.  Results for orders placed or performed in visit on 01/22/15  Lipid panel  Result Value Ref Range   Cholesterol 198 0 - 200 mg/dL   Triglycerides 142.0 0.0 - 149.0 mg/dL   HDL 41.40 >39.00 mg/dL   VLDL 28.4 0.0 - 40.0 mg/dL   LDL Cholesterol 128 (H) 0 - 99 mg/dL   Total CHOL/HDL Ratio 5    NonHDL 156.77   TSH  Result Value Ref Range   TSH 2.04 0.35 -  4.50 uIU/mL  CBC with Differential/Platelet  Result Value Ref Range   WBC 6.3 4.0 - 10.5 K/uL   RBC 4.53 3.87 - 5.11 Mil/uL   Hemoglobin 13.0 12.0 - 15.0 g/dL   HCT 38.9 36.0 - 46.0 %   MCV 86.0 78.0 - 100.0 fl   MCHC 33.3 30.0 - 36.0 g/dL   RDW 14.1 11.5 - 15.5 %   Platelets 234.0 150.0 - 400.0 K/uL   Neutrophils Relative % 54.6 43.0 - 77.0 %   Lymphocytes Relative 34.3 12.0 - 46.0 %   Monocytes Relative 6.1 3.0 - 12.0 %   Eosinophils Relative 4.5 0.0 - 5.0 %   Basophils Relative 0.5 0.0 - 3.0 %   Neutro Abs 3.5 1.4 - 7.7 K/uL   Lymphs Abs 2.2 0.7 - 4.0 K/uL   Monocytes Absolute 0.4 0.1 - 1.0 K/uL   Eosinophils Absolute 0.3 0.0 - 0.7 K/uL   Basophils Absolute 0.0 0.0 - 0.1 K/uL  Vit D  25 hydroxy (rtn osteoporosis monitoring)  Result Value Ref Range   VITD 51.96 30.00 - 100.00 ng/mL  Microalbumin / creatinine urine ratio  Result Value Ref Range   Microalb, Ur 0.7 0.0 - 1.9 mg/dL   Creatinine,U 93.3 mg/dL   Microalb Creat Ratio 0.8 0.0 - 30.0 mg/g  Comprehensive metabolic panel  Result Value Ref Range   Sodium 141 135 - 145 mEq/L   Potassium 4.9 3.5 - 5.1 mEq/L   Chloride 107 96 - 112 mEq/L   CO2 26 19 - 32 mEq/L   Glucose, Bld 130 (H) 70 - 99 mg/dL   BUN 23 6 - 23 mg/dL   Creatinine, Ser 1.28 (H) 0.40 - 1.20 mg/dL   Total Bilirubin 0.4 0.2 - 1.2 mg/dL     Alkaline Phosphatase 61 39 - 117 U/L   AST 16 0 - 37 U/L   ALT 10 0 - 35 U/L   Total Protein 7.4 6.0 - 8.3 g/dL   Albumin 4.3 3.5 - 5.2 g/dL   Calcium 9.9 8.4 - 10.5 mg/dL   GFR 43.44 (L) >60.00 mL/min      Assessment & Plan:   Problem List Items Addressed This Visit    Vertebral artery stenosis/occlusion    Will continue to monitor and work towards good BP and HLD control. CT angio chest/aorta IMPRESSION: High grade stenosis or focal occlusion at the origin of left vertebral artery. This finding accounts for the abnormal signal identified on the recent carotid duplex examination. There is atherosclerotic disease in the left subclavian artery but no evidence for a critical stenosis or dissection involving the left subclavian artery. Great vessels are patent. Postsurgical changes consistent with a CABG. Electronically Signed  By: Markus Daft M.D.  On: 05/17/2013 16:22      Pure hypercholesterolemia    LDL not at goal <100 despite pravastatin 80mg  daily. Pt will f/u with Dr Marlou Porch      Osteoporosis    Update dexa. Never received records from prior PCP      Microscopic colitis    Stable watching diet and on lower zoloft dose.      Medicare annual wellness visit, subsequent - Primary    I have personally reviewed the Medicare Annual Wellness questionnaire and have noted 1. The patient's medical and social history 2. Their use of alcohol, tobacco or illicit drugs 3. Their current medications and supplements 4. The patient's functional ability including ADL's, fall risks, home safety risks and hearing or visual impairment. Cognitive function  has been assessed and addressed as indicated.  5. Diet and physical activity 6. Evidence for depression or mood disorders The patients weight, height, BMI have been recorded in the chart. I have made referrals, counseling and provided education to the patient based on review of the above and I have provided the pt with a written  personalized care plan for preventive services. Provider list updated.. See scanned questionairre as needed for further documentation. Reviewed preventative protocols and updated unless pt declined.       Hypothyroidism    Chronic, stable. Continue current regimen.      Dermatomyositis ([place])    asxs.      CKD (chronic kidney disease) stage 3, GFR 30-59 ml/min    Discussed with patient, rec increased water and avoid nephrotoxic meds.      Carotid stenosis    R bruit, [name] from 04/2013 showing <50% stenosis bilaterally.  Discussed with patient. Pt opts to address rpt [name] next visit.       Atherosclerosis of native coronary artery of native heart without angina pectoris    Due for f/u. Sees Dr Marlou Porch. H/o 5v CABG      Advanced care planning/counseling discussion    Advanced directive discussion - in process of setting this up with attorney. Will bring [name] copy. Packet provided today.      Adjustment disorder with mixed anxiety and depressed mood    Chronic, stable on zoloft 100mg , remeron 15mg  and klonopin 0.5mg  bid. Discussed changing klonopin to BID PRN. Counseling made depression worse so she stopped this (saw hospice counselor).       Other Visit Diagnoses    Estrogen deficiency        Relevant Orders    DG Bone Density        Follow up plan: Return in about 6 months (around 08/01/2015), or as needed, for follow up visit.

## 2015-01-31 NOTE — Assessment & Plan Note (Addendum)
R bruit, [name] from 04/2013 showing <50% stenosis bilaterally.  Discussed with patient. Pt opts to address rpt [name] next visit.

## 2015-01-31 NOTE — Assessment & Plan Note (Signed)
Stable watching diet and on lower zoloft dose.

## 2015-01-31 NOTE — Assessment & Plan Note (Signed)
Discussed with patient, rec increased water and avoid nephrotoxic meds.

## 2015-01-31 NOTE — Assessment & Plan Note (Signed)
Due for f/u. Sees Dr Marlou Porch. H/o 5v CABG

## 2015-01-31 NOTE — Assessment & Plan Note (Signed)
Advanced directive discussion - in process of setting this up with attorney. Will bring [name] copy. Packet provided today.

## 2015-02-02 LAB — HM DIABETES EYE EXAM

## 2015-02-05 ENCOUNTER — Encounter: Payer: Self-pay | Admitting: Family Medicine

## 2015-02-05 ENCOUNTER — Other Ambulatory Visit: Payer: Self-pay | Admitting: Cardiology

## 2015-02-11 ENCOUNTER — Encounter: Payer: Self-pay | Admitting: Family Medicine

## 2015-02-11 DIAGNOSIS — E1169 Type 2 diabetes mellitus with other specified complication: Secondary | ICD-10-CM | POA: Insufficient documentation

## 2015-02-11 DIAGNOSIS — E118 Type 2 diabetes mellitus with unspecified complications: Secondary | ICD-10-CM | POA: Insufficient documentation

## 2015-02-11 DIAGNOSIS — G458 Other transient cerebral ischemic attacks and related syndromes: Secondary | ICD-10-CM | POA: Insufficient documentation

## 2015-02-13 ENCOUNTER — Encounter: Payer: Self-pay | Admitting: Family Medicine

## 2015-02-13 ENCOUNTER — Encounter: Payer: Self-pay | Admitting: *Deleted

## 2015-02-15 ENCOUNTER — Encounter: Payer: Self-pay | Admitting: Family Medicine

## 2015-02-16 ENCOUNTER — Encounter: Payer: Self-pay | Admitting: *Deleted

## 2015-02-19 ENCOUNTER — Encounter: Payer: Self-pay | Admitting: Cardiology

## 2015-02-19 ENCOUNTER — Ambulatory Visit (INDEPENDENT_AMBULATORY_CARE_PROVIDER_SITE_OTHER): Payer: PPO | Admitting: Cardiology

## 2015-02-19 VITALS — BP 120/68 | HR 66 | Ht 64.0 in | Wt 141.1 lb

## 2015-02-19 DIAGNOSIS — I2510 Atherosclerotic heart disease of native coronary artery without angina pectoris: Secondary | ICD-10-CM | POA: Diagnosis not present

## 2015-02-19 DIAGNOSIS — Z79899 Other long term (current) drug therapy: Secondary | ICD-10-CM | POA: Diagnosis not present

## 2015-02-19 DIAGNOSIS — E7800 Pure hypercholesterolemia, unspecified: Secondary | ICD-10-CM

## 2015-02-19 MED ORDER — NITROGLYCERIN 0.4 MG/SPRAY TL SOLN: 1.0000 | Status: DC | PRN

## 2015-02-19 MED ORDER — ROSUVASTATIN CALCIUM 10 MG PO TABS: 10.0000 mg | ORAL_TABLET | Freq: Every day | ORAL | Status: DC

## 2015-02-19 NOTE — Progress Notes (Signed)
[facility] [address], [address], [place]  [address] Phone: [phone] Fax:  [phone]  Date:  02/19/2015   ID:  CHANNEL PAPANDREA, DOB 08-Apr-1941, MRN [id number]  PCP:  [name], MD   History of Present Illness: Kathleen Williamson is a 74 y.o. female with coronary artery disease status post bypass in 2003 (CABG-LIMA-LAD, free right IMA to PDA,SVG-diagonal, SVG-OM 2-OM 5 03/2002), carotid artery disease, dermatomyositis, depression, hyperlipidemia, hypothyroidism here for annual followup. Former patient of Dr. Leonia Reeves. Nuclear stress test in 2012 showed no ischemia, 7 minutes exercise time.  Father and G father when he died.   She denies any active discomfort, shortness of breath. Her main complaints previously were paroxysmal diarrhea and intestinal dyspepsia. She also complains of restless leg syndrome. She has been compliant with her medications.   She repeated to me her story of her symptoms prior to bypass. She was singing in the church choir, felt an elephant sitting on her chest, felt poorly, pulse was erratic. She did not to the hospital at that time. The next day she saw an internist who sent her to Dr. Leonia Reeves.   Her husband died in Sep 16, 2013 had grieving.  In the past, she has tried Lipitor, Zocor but these cause body aches as she had increased dosing. She is tolerating pravastatin 80 mg but her LDL is not less than 100.    Wt Readings from Last 3 Encounters:  02/19/15 141 lb 1.9 oz (64.012 kg)  01/31/15 141 lb 12 oz (64.297 kg)  10/26/14 137 lb (62.143 kg)     Past Medical History  Diagnosis Date  . Osteoporosis     DEXA 03/2013 T -3.1  . CAD (coronary artery disease) 2003    MI s/p 5v CABG  . Hypothyroidism   . MDD (major depressive disorder), recurrent episode, moderate ([place])     h/o SI after lost husband unexpectedly 2015  . Arthritis     fingers  . History of chicken pox   . History of measles   . History of migraine none since 2003  .  HLD (hyperlipidemia)   . Dermatomyositis [facility])     saw Dr Estanislado Pandy, improved on its own  . Urine incontinence   . Pancreatic cyst     Dr Ralene Ok, no f/u needed  . Microscopic colitis 05/2014    by colonoscopy, lymphocytic, zoloft related  . Carotid stenosis     Moira Umholtz  . CKD (chronic kidney disease) stage 3, GFR 30-59 ml/min   . Subclavian steal syndrome   . Prediabetes 2014    Past Surgical History  Procedure Laterality Date  . Lump removed from breast Left 1977    benign  . Back surgery  1981    lower  . Rotator cuff repair Right 1990's  . Total abdominal hysterectomy w/ bilateral salpingoophorectomy  1997    endometriosis  . Appendectomy  1997    with hysterectomy  . Coronary artery bypass graft  2003    x 5 v  . Tooth extraction  yrs ago  . Esophagogastroduodenoscopy (egd) with propofol N/A 05/23/2014    WNL Garlan Fair, MD  . Colonoscopy with propofol N/A 05/23/2014    microscopic colitis - Garlan Fair, MD    Current Outpatient Prescriptions  Medication Sig Dispense Refill  . Ascorbic Acid (VITAMIN C) 1000 MG tablet Take 1,000 mg by mouth daily.    Marland Kitchen aspirin EC 81 MG tablet Take  81 mg by mouth at bedtime.    . Cholecalciferol (VITAMIN D PO) Take 2,000 Units by mouth daily with breakfast.     . clonazePAM (KLONOPIN) 0.5 MG tablet TAKE 1 TABLET BY MOUTH TWICE A DAY AS NEEDED 60 tablet 0  . Coenzyme Q10 (COQ10) 100 MG CAPS Take 1 capsule by mouth at bedtime.     . cycloSPORINE (RESTASIS) 0.05 % ophthalmic emulsion Place 1 drop into both eyes 2 (two) times daily.     Marland Kitchen lactobacillus acidophilus (BACID) TABS tablet Take 2 tablets by mouth as needed.    Marland Kitchen levothyroxine (SYNTHROID, LEVOTHROID) 50 MCG tablet Take 50 mcg by mouth See admin instructions. Takes everyday except for Sunday's    . loperamide (IMODIUM A-D) 2 MG tablet Take 2 mg by mouth every morning.    . mirtazapine (REMERON) 15 MG tablet Take 15 mg by mouth at bedtime.    . Multiple Vitamin  (MULTIVITAMIN) tablet Take 1 tablet by mouth daily.    . pravastatin (PRAVACHOL) 80 MG tablet TAKE 1 TABLET BY MOUTH EVERY DAY 90 tablet 0  . sertraline (ZOLOFT) 100 MG tablet Take 100 mg by mouth daily with breakfast.     . vitamin B-12 (CYANOCOBALAMIN) 1000 MCG tablet Take 1,000 mcg by mouth daily.    . nitroGLYCERIN (NITROLINGUAL) 0.4 MG/SPRAY spray Place 1 spray under the tongue every 5 (five) minutes as needed. (Patient not taking: Reported on 02/19/2015) 12 g prn   No current facility-administered medications for this visit.    Allergies:    Allergies  Allergen Reactions  . Sulfa Antibiotics Nausea Only    Social History:  The patient  reports that she has never smoked. She has never used smokeless tobacco. She reports that she does not drink alcohol or use illicit drugs.   Family History  Problem Relation Age of Onset  . Cancer Maternal Aunt     breast  . CAD Father 25  . CAD Mother 33  . CAD Other     strong paternal side  . Diabetes Mother   . Stroke Neg Hx   . Rheum arthritis Mother     ROS:  Please see the history of present illness.   Denies any fevers, chills, orthopnea, PND.  All other systems reviewed and negative.   PHYSICAL EXAM: VS:  BP 120/68 mmHg  Pulse 66  Ht 5\' 4"  (1.626 m)  Wt 141 lb 1.9 oz (64.012 kg)  BMI 24.21 kg/m2 Well nourished, well developed, in no acute distress HEENT: normal, [place]/AT, EOMI Neck: no JVD, normal carotid upstroke, no bruit Cardiac:  normal S1, S2; RRR; no murmur Lungs:  clear to auscultation bilaterally, no wheezing, rhonchi or rales Abd: soft, nontender, no hepatomegaly, no bruits Ext: no edema, 2+ distal pulses Skin: warm and dry GU: deferred Neuro: no focal abnormalities noted, AAO x 3  EKG:  Today 02/19/15-sinus rhythm, 66, nonspecific ST-T wave changes personally viewed-prior 12/13/13-sinus rhythm, PVC, nonspecific ST-T wave changes, heart rate 69, QTC 454 ms      ASSESSMENT AND PLAN:  1. Coronary artery disease-status  post bypass in 03/2002 x 5. Overall doing well without any anginal symptoms. Continuing with secondary prevention. 2. Dermatomyositis-symptoms originally in 2010. Apparently this has resolved. 3. Hyperlipidemia-LDL goal of 70. off Niaspan. Tried lipitor and zocor but felt body aches. We will go ahead and try Crestor 10 mg once a day. She has not tried this in the past. I would like to see her LDL less than  100. Most current LDL was 128. 4. We will see her back in 6 weeks for lab work, lipid panel, ALT and then 2 months in the clinic.   Signed, Candee Furbish, MD [facility]  02/19/2015 4:20 PM

## 2015-02-19 NOTE — Patient Instructions (Signed)
Medication Instructions:  Please stop Pravastatin and start Crestor 10 mg a day. Continue all other medications as listed.  Labwork: Please have blood work in 6 weeks. (lipid and ALT)  Follow-Up: Follow up in 2 months with Dr Marlou Porch.  If you need a refill on your cardiac medications before your next appointment, please call your pharmacy.  Thank you for choosing [facility]!!

## 2015-02-20 ENCOUNTER — Encounter: Payer: Self-pay | Admitting: Family Medicine

## 2015-02-21 ENCOUNTER — Encounter: Payer: Self-pay | Admitting: Family Medicine

## 2015-02-21 ENCOUNTER — Other Ambulatory Visit: Payer: Self-pay | Admitting: Family Medicine

## 2015-02-21 NOTE — Telephone Encounter (Signed)
Ok to refill 

## 2015-02-21 NOTE — Telephone Encounter (Signed)
Rx called in as directed.   

## 2015-02-21 NOTE — Telephone Encounter (Signed)
plz phone in. 

## 2015-02-22 ENCOUNTER — Encounter: Payer: Self-pay | Admitting: *Deleted

## 2015-03-26 ENCOUNTER — Other Ambulatory Visit: Payer: Self-pay | Admitting: Family Medicine

## 2015-03-26 NOTE — Telephone Encounter (Signed)
Rx called to pharmacy as instructed. 

## 2015-03-26 NOTE — Telephone Encounter (Signed)
plz phone in. 

## 2015-03-26 NOTE — Telephone Encounter (Signed)
Ok to refill 

## 2015-04-02 ENCOUNTER — Other Ambulatory Visit (INDEPENDENT_AMBULATORY_CARE_PROVIDER_SITE_OTHER): Payer: PPO | Admitting: *Deleted

## 2015-04-02 DIAGNOSIS — E7800 Pure hypercholesterolemia, unspecified: Secondary | ICD-10-CM

## 2015-04-02 DIAGNOSIS — I2510 Atherosclerotic heart disease of native coronary artery without angina pectoris: Secondary | ICD-10-CM | POA: Diagnosis not present

## 2015-04-02 DIAGNOSIS — Z79899 Other long term (current) drug therapy: Secondary | ICD-10-CM

## 2015-04-02 LAB — LIPID PANEL
CHOL/HDL RATIO: 4.2 ratio (ref <=5.0)
CHOLESTEROL: 163 mg/dL (ref 125–200)
HDL: 39 mg/dL — AB (ref >=46)
LDL Cholesterol: 88 mg/dL (ref <130)
Triglycerides: 178 mg/dL — ABNORMAL HIGH (ref <150)
VLDL: 36 mg/dL — AB (ref <30)

## 2015-04-02 LAB — ALT: ALT: 13 U/L (ref 6–29)

## 2015-04-24 ENCOUNTER — Other Ambulatory Visit: Payer: Self-pay | Admitting: Family Medicine

## 2015-04-24 NOTE — Telephone Encounter (Signed)
plz phone in. 

## 2015-04-24 NOTE — Telephone Encounter (Signed)
Rx called in as directed.   

## 2015-05-02 ENCOUNTER — Ambulatory Visit (INDEPENDENT_AMBULATORY_CARE_PROVIDER_SITE_OTHER): Payer: PPO | Admitting: Cardiology

## 2015-05-02 ENCOUNTER — Encounter: Payer: Self-pay | Admitting: Cardiology

## 2015-05-02 ENCOUNTER — Encounter: Payer: Self-pay | Admitting: *Deleted

## 2015-05-02 VITALS — BP 130/60 | HR 67 | Ht 64.0 in | Wt 142.4 lb

## 2015-05-02 DIAGNOSIS — I2510 Atherosclerotic heart disease of native coronary artery without angina pectoris: Secondary | ICD-10-CM | POA: Diagnosis not present

## 2015-05-02 DIAGNOSIS — E7800 Pure hypercholesterolemia, unspecified: Secondary | ICD-10-CM | POA: Diagnosis not present

## 2015-05-02 DIAGNOSIS — Z79899 Other long term (current) drug therapy: Secondary | ICD-10-CM

## 2015-05-02 DIAGNOSIS — Z789 Other specified health status: Secondary | ICD-10-CM

## 2015-05-02 MED ORDER — PRAVASTATIN SODIUM 80 MG PO TABS: 80.0000 mg | ORAL_TABLET | Freq: Every evening | ORAL | Status: DC

## 2015-05-02 NOTE — Patient Instructions (Signed)
Medication Instructions:  Please take Pravastatin 80 mg a day. Continue all other medications as listed.  Labwork: Please have blood work prior to your appt in 6 months with Dr Marlou Porch. (Lipid/ALT)  Follow-Up: Follow up in 6 months with Dr. Marlou Porch.  You will receive a letter in the mail 2 months before you are due.  Please call us when you receive this letter to schedule your follow up appointment.  If you need a refill on your cardiac medications before your next appointment, please call your pharmacy.  Thank you for choosing [facility]!!

## 2015-05-02 NOTE — Progress Notes (Signed)
[place]. [address], [place]  [address] Phone: [phone] Fax:  [phone]  Date:  05/02/2015   ID:  Kathleen Williamson, DOB Feb 24, 1941, MRN [id number]  PCP:  [name], MD   History of Present Illness: Kathleen Williamson is a 75 y.o. female with coronary artery disease status post bypass in 2003 (CABG-LIMA-LAD, free right IMA to PDA,SVG-diagonal, SVG-OM 2-OM 5 03/2002), carotid artery disease, dermatomyositis, depression, hyperlipidemia, hypothyroidism here for annual followup. Former patient of Dr. Leonia Reeves. Nuclear stress test in 2012 showed no ischemia, 7 minutes exercise time.   She denies any active discomfort, shortness of breath.  She repeated to me her story of her symptoms prior to bypass. She was singing in the church choir, felt an elephant sitting on her chest, felt poorly, pulse was erratic. She did not to the hospital at that time. The next day she saw an internist who sent her to Dr. Leonia Reeves.   Her husband died in 18-Sep-2013 had grieving. Lives at [facility]. Enjoys it there.   In the past, she has tried Lipitor, Zocor, Crestor but these cause body aches as she had increased dosing. She is tolerating pravastatin 80 mg    Wt Readings from Last 3 Encounters:  05/02/15 142 lb 6.4 oz (64.592 kg)  02/19/15 141 lb 1.9 oz (64.012 kg)  01/31/15 141 lb 12 oz (64.297 kg)     Past Medical History  Diagnosis Date  . Osteoporosis     DEXA 03/2013 T -3.1, DEXA 10//2016 -2.9 hip, -2.2 spine  . CAD (coronary artery disease) 2003    MI s/p 5v CABG  . Hypothyroidism   . MDD (major depressive disorder), recurrent episode, moderate ([place])     h/o SI after lost husband unexpectedly 2015  . Arthritis     fingers  . History of chicken pox   . History of measles   . History of migraine none since 2003  . HLD (hyperlipidemia)   . Dermatomyositis [facility])     saw Dr Estanislado Pandy, improved on its own  . Urine incontinence   . Pancreatic cyst     Dr Ralene Ok, no  f/u needed  . Microscopic colitis 05/2014    by colonoscopy, lymphocytic, zoloft related  . Carotid stenosis     Skains  . CKD (chronic kidney disease) stage 3, GFR 30-59 ml/min   . Subclavian steal syndrome   . Prediabetes 2014    Past Surgical History  Procedure Laterality Date  . Breast lumpectomy Left 1977    benign  . Back surgery  1981    lower  . Rotator cuff repair Right 1990's  . Total abdominal hysterectomy w/ bilateral salpingoophorectomy  1997    endometriosis  . Appendectomy  1997    with hysterectomy  . Coronary artery bypass graft  2003    x 5 v  . Tooth extraction  yrs ago  . Esophagogastroduodenoscopy (egd) with propofol N/A 05/23/2014    WNL Garlan Fair, MD  . Colonoscopy with propofol N/A 05/23/2014    microscopic colitis - Garlan Fair, MD    Current Outpatient Prescriptions  Medication Sig Dispense Refill  . Ascorbic Acid (VITAMIN C) 1000 MG tablet Take 1,000 mg by mouth daily.    Marland Kitchen aspirin EC 81 MG tablet Take 81 mg by mouth at bedtime.    . Cholecalciferol (VITAMIN D PO) Take 2,000 Units by mouth daily with breakfast.     .  clonazePAM (KLONOPIN) 0.5 MG tablet Take 0.5 mg by mouth 2 (two) times daily as needed for anxiety.    . Coenzyme Q10 (COQ10) 100 MG CAPS Take 1 capsule by mouth at bedtime.     . cycloSPORINE (RESTASIS) 0.05 % ophthalmic emulsion Place 1 drop into both eyes 2 (two) times daily.     Marland Kitchen lactobacillus acidophilus (BACID) TABS tablet Take 2 tablets by mouth as directed.     Marland Kitchen levothyroxine (SYNTHROID, LEVOTHROID) 50 MCG tablet Take 50 mcg by mouth as directed.     . loperamide (IMODIUM A-D) 2 MG tablet Take 2 mg by mouth every morning.    . mirtazapine (REMERON) 15 MG tablet Take 15 mg by mouth at bedtime.    . Multiple Vitamin (MULTIVITAMIN) tablet Take 1 tablet by mouth daily.    . nitroGLYCERIN (NITROLINGUAL) 0.4 MG/SPRAY spray Place 1 spray under the tongue every 5 (five) minutes as needed. 12 g prn  . sertraline (ZOLOFT) 100  MG tablet Take 100 mg by mouth daily with breakfast.     . vitamin B-12 (CYANOCOBALAMIN) 1000 MCG tablet Take 1,000 mcg by mouth daily.     No current facility-administered medications for this visit.    Allergies:    Allergies  Allergen Reactions  . Sulfa Antibiotics Nausea Only    Social History:  The patient  reports that she has never smoked. She has never used smokeless tobacco. She reports that she does not drink alcohol or use illicit drugs.   Family History  Problem Relation Age of Onset  . Cancer Maternal Aunt     breast  . CAD Father 39  . CAD Mother 49  . CAD Other     strong paternal side  . Diabetes Mother   . Stroke Neg Hx   . Rheum arthritis Mother     ROS:  Please see the history of present illness.   Denies any fevers, chills, orthopnea, PND.  All other systems reviewed and negative.   PHYSICAL EXAM: VS:  BP 130/60 mmHg  Pulse 67  Ht 5\' 4"  (1.626 m)  Wt 142 lb 6.4 oz (64.592 kg)  BMI 24.43 kg/m2  SpO2 98% Well nourished, well developed, in no acute distress HEENT: normal, [place]/AT, EOMI Neck: no JVD, normal carotid upstroke, no bruit Cardiac:  normal S1, S2; RRR; no murmur Lungs:  clear to auscultation bilaterally, no wheezing, rhonchi or rales Abd: soft, nontender, no hepatomegaly, no bruits Ext: no edema, 2+ distal pulses Skin: warm and dry GU: deferred Neuro: no focal abnormalities noted, AAO x 3  EKG:   02/19/15-sinus rhythm, 66, nonspecific ST-T wave changes personally viewed-prior 12/13/13-sinus rhythm, PVC, nonspecific ST-T wave changes, heart rate 69, QTC 454 ms      ASSESSMENT AND PLAN:  1. Coronary artery disease-status post bypass in 03/2002 x 5. Overall doing well without any anginal symptoms. One episode of chest pressure which she has not had since stopping Crestor. Continuing with secondary prevention. 2. Dermatomyositis-symptoms originally in 2010. Apparently this has resolved. 3. Hyperlipidemia--off Niaspan. Tried lipitor and zocor but  felt body aches. Crestor - leg aches. LDL went from 128 down to 88 with Crestor but she did not tolerate. She is now back on pravastatin 80 mg once a day. We could consider Zetia addition in the future. 4. Statin intolerance-Crestor, Lipitor, Zocor. 5. We will see her back in 6 weeks for lab work, lipid panel, ALT and then 2 months in the clinic.   Signed, Candee Furbish,  MD [facility]  05/02/2015 2:06 PM

## 2015-05-05 ENCOUNTER — Other Ambulatory Visit: Payer: Self-pay | Admitting: Cardiology

## 2015-05-24 ENCOUNTER — Other Ambulatory Visit: Payer: Self-pay | Admitting: Family Medicine

## 2015-05-24 NOTE — Telephone Encounter (Signed)
Electronic refill request.   Last Filled:   #60 on 04/24/2015 Last office visit:   01/31/15 CPE   Please advise.

## 2015-05-25 NOTE — Telephone Encounter (Signed)
plz phone in. 

## 2015-05-25 NOTE — Telephone Encounter (Signed)
rx called into pharmacy

## 2015-05-27 ENCOUNTER — Other Ambulatory Visit: Payer: Self-pay | Admitting: Family Medicine

## 2015-05-28 NOTE — Telephone Encounter (Signed)
Pt wants to know why clonazepam has not been called in to [facility]; med was called in 05/25/15 but spoke with Danae Chen at [facility] and does not have record of med being called in. Medication phoned to [facility] at [facility] as instructed. Pt can pick up med today at 6 pm. Pt voiced understanding.

## 2015-05-28 NOTE — Telephone Encounter (Signed)
plz phone in if not already done. 

## 2015-05-28 NOTE — Telephone Encounter (Signed)
Rx called in as directed.   

## 2015-06-18 ENCOUNTER — Other Ambulatory Visit: Payer: Self-pay | Admitting: Family Medicine

## 2015-06-18 NOTE — Telephone Encounter (Signed)
Ok to refill 

## 2015-06-19 NOTE — Telephone Encounter (Signed)
Rx called in as directed.   

## 2015-06-19 NOTE — Telephone Encounter (Signed)
plz phone in. 

## 2015-06-26 ENCOUNTER — Other Ambulatory Visit: Payer: Self-pay | Admitting: Cardiology

## 2015-06-29 ENCOUNTER — Other Ambulatory Visit: Payer: Self-pay | Admitting: Family Medicine

## 2015-06-30 ENCOUNTER — Other Ambulatory Visit: Payer: Self-pay | Admitting: Family Medicine

## 2015-07-03 ENCOUNTER — Encounter: Payer: Self-pay | Admitting: Primary Care

## 2015-07-03 ENCOUNTER — Ambulatory Visit (INDEPENDENT_AMBULATORY_CARE_PROVIDER_SITE_OTHER): Payer: PPO | Admitting: Primary Care

## 2015-07-03 VITALS — BP 122/70 | HR 67 | Temp 98.1°F | Ht 64.0 in | Wt 144.4 lb

## 2015-07-03 DIAGNOSIS — R229 Localized swelling, mass and lump, unspecified: Secondary | ICD-10-CM

## 2015-07-03 NOTE — Progress Notes (Signed)
Subjective:    Patient ID: Kathleen Williamson, female    DOB: 19-Apr-1940, 75 y.o.   MRN: [id number]  HPI  Kathleen Williamson is a 75 year old female who presents today with a chief complaint of skin mass. Her mass is located to the posterior popliteal fossa of her left lower extremity and has been present for several weeks. Her mass is bothersome upon ambulation and causes stiffness to her knee when walking. She's not noticed any change in size or color of the mass. Her pain will wax and wane. She's taken Aspirin and Tylenol without improvement as she cannot take NSAIDS. She does have a history of arthritis in her left knee. She does walk a lot during the day as she lives in a retirement community. Denies fevers, color changes to her lower extremities, calf swelling.   Review of Systems  Constitutional: Negative for fever and chills.  Cardiovascular: Negative for leg swelling.  Skin: Negative for color change and rash.       Swelling to left popliteal fossa.       Past Medical History  Diagnosis Date  . Osteoporosis     DEXA 03/2013 T -3.1, DEXA 10//2016 -2.9 hip, -2.2 spine  . CAD (coronary artery disease) 2003    MI s/p 5v CABG  . Hypothyroidism   . MDD (major depressive disorder), recurrent episode, moderate ([place])     h/o SI after lost husband unexpectedly 2015  . Arthritis     fingers  . History of chicken pox   . History of measles   . History of migraine none since 2003  . HLD (hyperlipidemia)   . Dermatomyositis [facility])     saw Dr Estanislado Pandy, improved on its own  . Urine incontinence   . Pancreatic cyst     Dr Ralene Ok, no f/u needed  . Microscopic colitis 05/2014    by colonoscopy, lymphocytic, zoloft related  . Carotid stenosis     Skains  . CKD (chronic kidney disease) stage 3, GFR 30-59 ml/min   . Subclavian steal syndrome   . Prediabetes 2014    Social History   Social History  . Marital Status: Married    Spouse Name: N/A  . Number of Children: N/A  . Years of  Education: N/A   Occupational History  . Not on file.   Social History Main Topics  . Smoking status: Never Smoker   . Smokeless tobacco: Never Used  . Alcohol Use: No  . Drug Use: No  . Sexual Activity: Not on file   Other Topics Concern  . Not on file   Social History Narrative   Lives alone at [facility].   Husband of 37 yrs died suddenly 2013-10-12.   No children, no siblings.    Close friends Bethena Roys and Ace Gins nearby, cousin in [place].   Activity: [facility] to [employer]    Past Surgical History  Procedure Laterality Date  . Breast lumpectomy Left 1977    benign  . Back surgery  1981    lower  . Rotator cuff repair Right 1990's  . Total abdominal hysterectomy w/ bilateral salpingoophorectomy  1997    endometriosis  . Appendectomy  1997    with hysterectomy  . Coronary artery bypass graft  2003    x 5 v  . Tooth extraction  yrs ago  . Esophagogastroduodenoscopy (egd) with propofol N/A 05/23/2014    WNL Garlan Fair, MD  . Colonoscopy with propofol N/A 05/23/2014  microscopic colitis - Garlan Fair, MD    Family History  Problem Relation Age of Onset  . Cancer Maternal Aunt     breast  . CAD Father 35  . CAD Mother 21  . CAD Other     strong paternal side  . Diabetes Mother   . Stroke Neg Hx   . Rheum arthritis Mother     Allergies  Allergen Reactions  . Sulfa Antibiotics Nausea Only  . Crestor [Rosuvastatin Calcium] Other (See Comments)    Leg ache    Current Outpatient Prescriptions on File Prior to Visit  Medication Sig Dispense Refill  . Ascorbic Acid (VITAMIN C) 1000 MG tablet Take 1,000 mg by mouth daily.    Marland Kitchen aspirin EC 81 MG tablet Take 81 mg by mouth at bedtime.    . Cholecalciferol (VITAMIN D PO) Take 2,000 Units by mouth daily with breakfast.     . clonazePAM (KLONOPIN) 0.5 MG tablet TAKE 1 TABLET BY MOUTH TWICE A DAY 60 tablet 3  . Coenzyme Q10 (COQ10) 100 MG CAPS Take 1 capsule by mouth at bedtime.     . cycloSPORINE (RESTASIS) 0.05 %  ophthalmic emulsion Place 1 drop into both eyes 2 (two) times daily.     Marland Kitchen lactobacillus acidophilus (BACID) TABS tablet Take 2 tablets by mouth as directed.     Marland Kitchen levothyroxine (SYNTHROID, LEVOTHROID) 50 MCG tablet TAKE 1 TABLET EVERY DAY 90 tablet 3  . loperamide (IMODIUM A-D) 2 MG tablet Take 2 mg by mouth every morning.    . mirtazapine (REMERON) 15 MG tablet Take 15 mg by mouth at bedtime.    . Multiple Vitamin (MULTIVITAMIN) tablet Take 1 tablet by mouth daily.    . nitroGLYCERIN (NITROLINGUAL) 0.4 MG/SPRAY spray Place 1 spray under the tongue every 5 (five) minutes as needed. 12 g prn  . pravastatin (PRAVACHOL) 80 MG tablet Take 1 tablet (80 mg total) by mouth every evening. 30 tablet 11  . sertraline (ZOLOFT) 100 MG tablet TAKE 1 TABLET BY MOUTH EVERY DAY 90 tablet 3  . vitamin B-12 (CYANOCOBALAMIN) 1000 MCG tablet Take 1,000 mcg by mouth daily.     No current facility-administered medications on file prior to visit.    BP 122/70 mmHg  Pulse 67  Temp(Src) 98.1 F (36.7 C) (Oral)  Ht 5\' 4"  (1.626 m)  Wt 144 lb 6.4 oz (65.499 kg)  BMI 24.77 kg/m2  SpO2 98%    Objective:   Physical Exam  Constitutional: She appears well-nourished.  Cardiovascular: Normal rate and regular rhythm.   Pulses:      Dorsalis pedis pulses are 2+ on the right side, and 2+ on the left side.       Posterior tibial pulses are 2+ on the right side, and 2+ on the left side.  Negative homans sign.  Pulmonary/Chest: Effort normal and breath sounds normal.  Skin: Skin is warm and dry. No erythema.  Moderate swelling to left poplitea fossa. Non tender. No erythema or color change. Feels like a cyst.          Assessment & Plan:  Popliteal Fossa Mass:  Located to left side. Present for 2-3 weeks. Exam consistent with Baker's Cyst. No s/s of infection. Discussed supportive treatment options such as knee brace, ice, rest. Will send for [name] to rule out any other abnormality. Do not suspect DVT.  Negative Homans sign, 2+ pedal pulses bilaterally.

## 2015-07-03 NOTE — Patient Instructions (Signed)
Stop by the front desk and speak with either Rosaria Ferries or Ebony Hail regarding your Ultrasound.  Please notify me immediately if you noticed changes to the color of your leg, your develop numbness/tingling, you cannot feel the pulse to your feet.  It was a pleasure meeting you!  Baker Cyst A Baker cyst is a sac-like structure that forms in the back of the knee. It is filled with the same fluid that is located in your knee. This fluid lubricates the bones and cartilage of the knee and allows them to move over each other more easily. CAUSES  When the knee becomes injured or inflamed, increased fluid forms in the knee. When this happens, the joint lining is pushed out behind the knee and forms the Baker cyst. This cyst may also be caused by inflammation from arthritic conditions and infections. SIGNS AND SYMPTOMS  A Baker cyst usually has no symptoms. When the cyst is substantially enlarged:  You may feel pressure behind the knee, stiffness in the knee, or a mass in the area behind the knee.  You may develop pain, redness, and swelling in the calf. This can suggest a blood clot and requires evaluation by your health care provider. DIAGNOSIS  A Baker cyst is most often found during an ultrasound exam. This exam may have been performed for other reasons, and the cyst was found incidentally. Sometimes an MRI is used. This picks up other problems within a joint that an ultrasound exam may not. If the Baker cyst developed immediately after an injury, X-ray exams may be used to diagnose the cyst. TREATMENT  The treatment depends on the cause of the cyst. Anti-inflammatory medicines and rest often will be prescribed. If the cyst is caused by a bacterial infection, antibiotic medicines may be prescribed.  HOME CARE INSTRUCTIONS   If the cyst was caused by an injury, for the first 24 hours, keep the injured leg elevated on 2 pillows while lying down.  For the first 24 hours while you are awake, apply ice to  the injured area:  Put ice in a plastic bag.  Place a towel between your skin and the bag.  Leave the ice on for 20 minutes, 2-3 times a day.  Only take over-the-counter or prescription medicines for pain, discomfort, or fever as directed by your health care provider.  Only take antibiotic medicine as directed. Make sure to finish it even if you start to feel better. MAKE SURE YOU:   Understand these instructions.  Will watch your condition.  Will get help right away if you are not doing well or get worse.   This information is not intended to replace advice given to you by your health care provider. Make sure you discuss any questions you have with your health care provider.   Document Released: 03/31/2005 Document Revised: 01/19/2013 Document Reviewed: 11/10/2012 Elsevier Interactive Patient Education [employer].

## 2015-07-03 NOTE — Progress Notes (Signed)
Pre visit review using our clinic review tool, if applicable. No additional management support is needed unless otherwise documented below in the visit note. 

## 2015-07-05 ENCOUNTER — Ambulatory Visit
Admission: RE | Admit: 2015-07-05 | Discharge: 2015-07-05 | Disposition: A | Discharge status: Home / Self Care | Payer: PPO | Source: Ambulatory Visit | Attending: Primary Care | Admitting: Primary Care

## 2015-07-05 ENCOUNTER — Telehealth: Payer: Self-pay | Admitting: Family Medicine

## 2015-07-05 DIAGNOSIS — R2242 Localized swelling, mass and lump, left lower limb: Secondary | ICD-10-CM

## 2015-07-05 DIAGNOSIS — M7122 Synovial cyst of popliteal space [Baker], left knee: Secondary | ICD-10-CM | POA: Diagnosis not present

## 2015-07-05 DIAGNOSIS — R229 Localized swelling, mass and lump, unspecified: Secondary | ICD-10-CM | POA: Insufficient documentation

## 2015-07-05 DIAGNOSIS — M7989 Other specified soft tissue disorders: Secondary | ICD-10-CM | POA: Diagnosis not present

## 2015-07-05 NOTE — Telephone Encounter (Signed)
See below, consider MRi

## 2015-07-05 NOTE — Telephone Encounter (Signed)
[facility]  Patient Name: Kathleen Williamson  DOB: 09/20/1940    Initial Comment [name] states she is calling with ultrasound results from [facility]   Nurse Assessment  Nurse: Christel Mormon, RN, Levada Dy Date/Time Eilene Ghazi Time): 07/05/2015 5:53:35 PM  Confirm and document reason for call. If symptomatic, describe symptoms. You must click the next button to save text entered. ---Ultrasound Result Left lower Extremity - done today 07/05/15 complex cystic mass in the (left) popliteal fossa and corresponds to palpable abnormality 6.5cm x 2.5cm. Favor benign popliteal cyst but given internal septations that may have associated vacularity. Recommendation MRI with contrast to rule out neoplastic mass.  Has the patient traveled out of the country within the last 30 days? ---Not Applicable  Does the patient have any new or worsening symptoms? ---No     Guidelines    Guideline Title Affirmed Question Affirmed Notes       Final Disposition User   Clinical Call [place], Therapist, sports, Levada Dy

## 2015-07-06 ENCOUNTER — Other Ambulatory Visit: Payer: Self-pay | Admitting: Primary Care

## 2015-07-06 ENCOUNTER — Telehealth: Payer: Self-pay | Admitting: Primary Care

## 2015-07-06 DIAGNOSIS — R2242 Localized swelling, mass and lump, left lower limb: Secondary | ICD-10-CM

## 2015-07-06 NOTE — Telephone Encounter (Signed)
Noted and reviewed. Call out to patient today with results and recommendations for MRI.

## 2015-07-06 NOTE — Telephone Encounter (Signed)
Noted. MRI order placed. Patient to be scheduled per coordinator.

## 2015-07-06 NOTE — Telephone Encounter (Signed)
Order changed.

## 2015-07-06 NOTE — Telephone Encounter (Signed)
Patient advised. Patient agrees to MRI.  Please arrange.

## 2015-07-06 NOTE — Telephone Encounter (Signed)
Patient called to schedule a MRI at [facility].  Juliann Pulse at [facility] said the order is wrong it needs to be MRI Left Knee with/without contrast. They can't schedule the appointment until the order is changed.

## 2015-07-08 NOTE — Telephone Encounter (Signed)
Will await results

## 2015-07-09 ENCOUNTER — Other Ambulatory Visit (INDEPENDENT_AMBULATORY_CARE_PROVIDER_SITE_OTHER): Payer: PPO

## 2015-07-09 DIAGNOSIS — R2242 Localized swelling, mass and lump, left lower limb: Secondary | ICD-10-CM

## 2015-07-09 LAB — BASIC METABOLIC PANEL
BUN: 28 mg/dL — AB (ref 6–23)
CO2: 25 mEq/L (ref 19–32)
CREATININE: 1.35 mg/dL — AB (ref 0.40–1.20)
Calcium: 10 mg/dL (ref 8.4–10.5)
Chloride: 107 mEq/L (ref 96–112)
GFR: 40.79 mL/min — ABNORMAL LOW (ref >60.00)
GLUCOSE: 123 mg/dL — AB (ref 70–99)
POTASSIUM: 4.9 meq/L (ref 3.5–5.1)
Sodium: 139 mEq/L (ref 135–145)

## 2015-07-16 ENCOUNTER — Other Ambulatory Visit: Payer: Self-pay | Admitting: Primary Care

## 2015-07-16 ENCOUNTER — Ambulatory Visit
Admission: RE | Admit: 2015-07-16 | Discharge: 2015-07-16 | Disposition: A | Discharge status: Home / Self Care | Payer: PPO | Source: Ambulatory Visit | Attending: Primary Care | Admitting: Primary Care

## 2015-07-16 DIAGNOSIS — S83207A Unspecified tear of unspecified meniscus, current injury, left knee, initial encounter: Secondary | ICD-10-CM

## 2015-07-16 DIAGNOSIS — R2242 Localized swelling, mass and lump, left lower limb: Secondary | ICD-10-CM

## 2015-07-16 DIAGNOSIS — S83242A Other tear of medial meniscus, current injury, left knee, initial encounter: Secondary | ICD-10-CM | POA: Diagnosis not present

## 2015-07-25 DIAGNOSIS — M25562 Pain in left knee: Secondary | ICD-10-CM | POA: Diagnosis not present

## 2015-07-25 DIAGNOSIS — M1712 Unilateral primary osteoarthritis, left knee: Secondary | ICD-10-CM | POA: Diagnosis not present

## 2015-08-01 ENCOUNTER — Ambulatory Visit (INDEPENDENT_AMBULATORY_CARE_PROVIDER_SITE_OTHER): Payer: PPO | Admitting: Family Medicine

## 2015-08-01 ENCOUNTER — Encounter: Payer: Self-pay | Admitting: Family Medicine

## 2015-08-01 VITALS — BP 128/62 | HR 65 | Temp 97.5°F | Wt 134.8 lb

## 2015-08-01 DIAGNOSIS — M7122 Synovial cyst of popliteal space [Baker], left knee: Secondary | ICD-10-CM

## 2015-08-01 DIAGNOSIS — N183 Chronic kidney disease, stage 3 unspecified: Secondary | ICD-10-CM

## 2015-08-01 DIAGNOSIS — M810 Age-related osteoporosis without current pathological fracture: Secondary | ICD-10-CM

## 2015-08-01 DIAGNOSIS — M7120 Synovial cyst of popliteal space [Baker], unspecified knee: Secondary | ICD-10-CM | POA: Insufficient documentation

## 2015-08-01 MED ORDER — VITAMIN D 1000 UNITS PO TABS: 1000.0000 [IU] | ORAL_TABLET | Freq: Every day | ORAL | Status: DC

## 2015-08-01 MED ORDER — CALCIUM CARBONATE-VITAMIN D 600-400 MG-UNIT PO CHEW: 1.0000 | CHEWABLE_TABLET | Freq: Every day | ORAL | Status: DC

## 2015-08-01 NOTE — Assessment & Plan Note (Signed)
Reviewed with patient latest dexa. Discussed dietary calcium and vit D intake. Pt hesitant for oral bisphosphonate due to concerns with limitations while taking med, would consider IV bisphosphonate or prolia. Provided with handout on prolia- pt will look into and notify us if desires Rx. H/o CKD.

## 2015-08-01 NOTE — Assessment & Plan Note (Signed)
Chronic, stable 

## 2015-08-01 NOTE — Patient Instructions (Addendum)
Bone density scan did show persistent osteoporosis. Start calcium+vit D 600/400mg  one tablet daily. May decrease vitamin D from 2000 units to 1000 units daily. Look into prolia for bone strengthening for osteoporosis.   Denosumab injection What is this medicine? DENOSUMAB (den oh sue mab) slows bone breakdown. Prolia is used to treat osteoporosis in women after menopause and in men. Delton See is used to prevent bone fractures and other bone problems caused by cancer bone metastases. Delton See is also used to treat giant cell tumor of the bone. This medicine may be used for other purposes; ask your health care provider or pharmacist if you have questions. What should I tell my health care provider before I take this medicine? They need to know if you have any of these conditions: -dental disease -eczema -infection or history of infections -kidney disease or on dialysis -low blood calcium or vitamin D -malabsorption syndrome -scheduled to have surgery or tooth extraction -taking medicine that contains denosumab -thyroid or parathyroid disease -an unusual reaction to denosumab, other medicines, foods, dyes, or preservatives -pregnant or trying to get pregnant -breast-feeding How should I use this medicine? This medicine is for injection under the skin. It is given by a health care professional in a hospital or clinic setting. If you are getting Prolia, a special MedGuide will be given to you by the pharmacist with each prescription and refill. Be sure to read this information carefully each time. For Prolia, talk to your pediatrician regarding the use of this medicine in children. Special care may be needed. For Delton See, talk to your pediatrician regarding the use of this medicine in children. While this drug may be prescribed for children as young as 13 years for selected conditions, precautions do apply. Overdosage: If you think you have taken too much of this medicine contact a poison control center  or emergency room at once. NOTE: This medicine is only for you. Do not share this medicine with others. What if I miss a dose? It is important not to miss your dose. Call your doctor or health care professional if you are unable to keep an appointment. What may interact with this medicine? Do not take this medicine with any of the following medications: -other medicines containing denosumab This medicine may also interact with the following medications: -medicines that suppress the immune system -medicines that treat cancer -steroid medicines like prednisone or cortisone This list may not describe all possible interactions. Give your health care provider a list of all the medicines, herbs, non-prescription drugs, or dietary supplements you use. Also tell them if you smoke, drink alcohol, or use illegal drugs. Some items may interact with your medicine. What should I watch for while using this medicine? Visit your doctor or health care professional for regular checks on your progress. Your doctor or health care professional may order blood tests and other tests to see how you are doing. Call your doctor or health care professional if you get a cold or other infection while receiving this medicine. Do not treat yourself. This medicine may decrease your body's ability to fight infection. You should make sure you get enough calcium and vitamin D while you are taking this medicine, unless your doctor tells you not to. Discuss the foods you eat and the vitamins you take with your health care professional. See your dentist regularly. Brush and floss your teeth as directed. Before you have any dental work done, tell your dentist you are receiving this medicine. Do not become pregnant while  taking this medicine or for 5 months after stopping it. Women should inform their doctor if they wish to become pregnant or think they might be pregnant. There is a potential for serious side effects to an unborn child.  Talk to your health care professional or pharmacist for more information. What side effects may I notice from receiving this medicine? Side effects that you should report to your doctor or health care professional as soon as possible: -allergic reactions like skin rash, itching or hives, swelling of the face, lips, or tongue -breathing problems -chest pain -fast, irregular heartbeat -feeling faint or lightheaded, falls -fever, chills, or any other sign of infection -muscle spasms, tightening, or twitches -numbness or tingling -skin blisters or bumps, or is dry, peels, or red -slow healing or unexplained pain in the mouth or jaw -unusual bleeding or bruising Side effects that usually do not require medical attention (Report these to your doctor or health care professional if they continue or are bothersome.): -muscle pain -stomach upset, gas This list may not describe all possible side effects. Call your doctor for medical advice about side effects. You may report side effects to FDA at 1-800-FDA-1088. Where should I keep my medicine? This medicine is only given in a clinic, doctor's office, or other health care setting and will not be stored at home. NOTE: This sheet is a summary. It may not cover all possible information. If you have questions about this medicine, talk to your doctor, pharmacist, or health care provider.    2016, Elsevier/Gold Standard. (2011-09-29 12:37:47)   Osteoporosis Osteoporosis is the thinning and loss of density in the bones. Osteoporosis makes the bones more brittle, fragile, and likely to break (fracture). Over time, osteoporosis can cause the bones to become so weak that they fracture after a simple fall. The bones most likely to fracture are the bones in the hip, wrist, and spine. CAUSES  The exact cause is not known. RISK FACTORS Anyone can develop osteoporosis. You may be at greater risk if you have a family history of the condition or have poor  nutrition. You may also have a higher risk if you are:   Female.   44 years old or older.  A smoker.  Not physically active.   White or Asian.  Slender. SIGNS AND SYMPTOMS  A fracture might be the first sign of the disease, especially if it results from a fall or injury that would not usually cause a bone to break. Other signs and symptoms include:   Low back and neck pain.  Stooped posture.  Height loss. DIAGNOSIS  To make a diagnosis, your health care provider may:  Take a medical history.  Perform a physical exam.  Order tests, such as:  A bone mineral density test.  A dual-energy X-ray absorptiometry test. TREATMENT  The goal of osteoporosis treatment is to strengthen your bones to reduce your risk of a fracture. Treatment may involve:  Making lifestyle changes, such as:  Eating a diet rich in calcium.  Doing weight-bearing and muscle-strengthening exercises.  Stopping tobacco use.  Limiting alcohol intake.  Taking medicine to slow the process of bone loss or to increase bone density.  Monitoring your levels of calcium and vitamin D. HOME CARE INSTRUCTIONS  Include calcium and vitamin D in your diet. Calcium is important for bone health, and vitamin D helps the body absorb calcium.  Perform weight-bearing and muscle-strengthening exercises as directed by your health care provider.  Do not use any tobacco products,  including cigarettes, chewing tobacco, and electronic cigarettes. If you need help quitting, ask your health care provider.  Limit your alcohol intake.  Take medicines only as directed by your health care provider.  Keep all follow-up visits as directed by your health care provider. This is important.  Take precautions at home to lower your risk of falling, such as:  Keeping rooms well lit and clutter free.  Installing safety rails on stairs.  Using rubber mats in the bathroom and other areas that are often wet or slippery. SEEK  IMMEDIATE MEDICAL CARE IF:  You fall or injure yourself.    This information is not intended to replace advice given to you by your health care provider. Make sure you discuss any questions you have with your health care provider.   Document Released: 01/08/2005 Document Revised: 04/21/2014 Document Reviewed: 09/08/2013 Elsevier Interactive Patient Education [employer].

## 2015-08-01 NOTE — Assessment & Plan Note (Signed)
With meniscal tear - saw ortho s/p steroid injection. Overall doing well, sxs have improved.

## 2015-08-01 NOTE — Progress Notes (Signed)
Pre visit review using our clinic review tool, if applicable. No additional management support is needed unless otherwise documented below in the visit note. 

## 2015-08-01 NOTE — Progress Notes (Signed)
BP 128/62 mmHg  Pulse 65  Temp(Src) 97.5 F (36.4 C) (Oral)  Wt 134 lb 12.8 oz (61.145 kg)  SpO2 98%   CC: 6 mo f/u visit  Subjective:    Patient ID: Kathleen Williamson, female    DOB: 07/16/1940, 75 y.o.   MRN: [id number]  HPI: Kathleen Williamson is a 75 y.o. female presenting on 08/01/2015 for Follow-up   Seen by Anda Kraft last month with L popliteal swelling s/p MRI showing complex baker's cyst along with horizontal tear of medial meniscus - referred to ortho who treated her with steroid shot. rec avoid walking but start stationary bicycling.   Osteoporosis - by recent DEXA 2016. Taking vit D 2000 daily. Eats cheese and ice cream but she does have mild lactose intolerance. Drinks some soy milk.   Lab Results  Component Value Date   TSH 2.04 01/22/2015    Relevant past medical, surgical, family and social history reviewed and updated as indicated. Interim medical history since our last visit reviewed. Allergies and medications reviewed and updated. Current Outpatient Prescriptions on File Prior to Visit  Medication Sig  . Ascorbic Acid (VITAMIN C) 1000 MG tablet Take 1,000 mg by mouth daily.  Marland Kitchen aspirin EC 81 MG tablet Take 81 mg by mouth at bedtime.  . clonazePAM (KLONOPIN) 0.5 MG tablet TAKE 1 TABLET BY MOUTH TWICE A DAY (Patient taking differently: TAKE 1 TABLET BY MOUTH TWICE A DAY/ [facility])  . Coenzyme Q10 (COQ10) 100 MG CAPS Take 1 capsule by mouth at bedtime.   . cycloSPORINE (RESTASIS) 0.05 % ophthalmic emulsion Place 1 drop into both eyes 2 (two) times daily.   Marland Kitchen lactobacillus acidophilus (BACID) TABS tablet Take 2 tablets by mouth as directed.   Marland Kitchen levothyroxine (SYNTHROID, LEVOTHROID) 50 MCG tablet TAKE 1 TABLET EVERY DAY  . loperamide (IMODIUM A-D) 2 MG tablet Take 2 mg by mouth every morning.  . mirtazapine (REMERON) 15 MG tablet Take 15 mg by mouth at bedtime.  . Multiple Vitamin (MULTIVITAMIN) tablet Take 1 tablet by mouth daily.  . nitroGLYCERIN (NITROLINGUAL)  0.4 MG/SPRAY spray Place 1 spray under the tongue every 5 (five) minutes as needed.  . pravastatin (PRAVACHOL) 80 MG tablet Take 1 tablet (80 mg total) by mouth every evening.  . sertraline (ZOLOFT) 100 MG tablet TAKE 1 TABLET BY MOUTH EVERY DAY  . vitamin B-12 (CYANOCOBALAMIN) 1000 MCG tablet Take 1,000 mcg by mouth daily.   No current facility-administered medications on file prior to visit.    Review of Systems Per HPI unless specifically indicated in ROS section     Objective:    BP 128/62 mmHg  Pulse 65  Temp(Src) 97.5 F (36.4 C) (Oral)  Wt 134 lb 12.8 oz (61.145 kg)  SpO2 98%  Wt Readings from Last 3 Encounters:  08/01/15 134 lb 12.8 oz (61.145 kg)  07/03/15 144 lb 6.4 oz (65.499 kg)  05/02/15 142 lb 6.4 oz (64.592 kg)    Physical Exam  Constitutional: She appears well-developed and well-nourished. No distress.  HENT:  Mouth/Throat: Oropharynx is clear and moist. No oropharyngeal exudate.  Cardiovascular: Normal rate, regular rhythm, normal heart sounds and intact distal pulses.   No murmur heard. Pulmonary/Chest: Effort normal and breath sounds normal. No respiratory distress. She has no wheezes. She has no rales.  Musculoskeletal: She exhibits no edema.  Skin: Skin is warm and dry. No rash noted.  Nursing note and vitals reviewed.  Results for orders placed or performed in visit  on [id number]  Basic metabolic panel  Result Value Ref Range   Sodium 139 135 - 145 mEq/L   Potassium 4.9 3.5 - 5.1 mEq/L   Chloride 107 96 - 112 mEq/L   CO2 25 19 - 32 mEq/L   Glucose, Bld 123 (H) 70 - 99 mg/dL   BUN 28 (H) 6 - 23 mg/dL   Creatinine, Ser 1.35 (H) 0.40 - 1.20 mg/dL   Calcium 10.0 8.4 - 10.5 mg/dL   GFR 40.79 (L) >60.00 mL/min      Assessment & Plan:   Problem List Items Addressed This Visit    Osteoporosis - Primary    Reviewed with patient latest dexa. Discussed dietary calcium and vit D intake. Pt hesitant for oral bisphosphonate due to concerns with limitations  while taking med, would consider IV bisphosphonate or prolia. Provided with handout on prolia- pt will look into and notify us if desires Rx. H/o CKD.       Relevant Medications   cholecalciferol (VITAMIN D) 1000 units tablet   Calcium Carbonate-Vitamin D 600-400 MG-UNIT chew tablet   CKD (chronic kidney disease) stage 3, GFR 30-59 ml/min    Chronic, stable.       Baker's cyst of knee    With meniscal tear - saw ortho s/p steroid injection. Overall doing well, sxs have improved.          Follow up plan: Return in about 6 months (around 01/31/2016), or as needed, for medicare wellness visit.  [name], MD

## 2015-08-15 DIAGNOSIS — M1712 Unilateral primary osteoarthritis, left knee: Secondary | ICD-10-CM | POA: Diagnosis not present

## 2015-09-11 DIAGNOSIS — M1712 Unilateral primary osteoarthritis, left knee: Secondary | ICD-10-CM | POA: Diagnosis not present

## 2015-10-19 ENCOUNTER — Other Ambulatory Visit: Payer: Self-pay | Admitting: Family Medicine

## 2015-10-22 ENCOUNTER — Other Ambulatory Visit: Payer: Self-pay

## 2015-10-22 ENCOUNTER — Encounter: Payer: Self-pay | Admitting: Cardiology

## 2015-10-22 NOTE — Telephone Encounter (Signed)
Received refill request electronically Last refill 03/19/15 #30/5 Last office visit 08/01/15

## 2015-10-24 ENCOUNTER — Other Ambulatory Visit: Payer: Self-pay

## 2015-10-24 NOTE — Telephone Encounter (Signed)
Last filled 09-20-15 #60 Last OV 08-01-15 Next OV 02-07-16

## 2015-10-25 MED ORDER — CLONAZEPAM 0.5 MG PO TABS: ORAL_TABLET | ORAL | Status: DC

## 2015-10-25 NOTE — Telephone Encounter (Signed)
Left refill on voice mail at pharmacy  

## 2015-10-25 NOTE — Telephone Encounter (Signed)
Plz phone in

## 2015-11-05 ENCOUNTER — Other Ambulatory Visit: Payer: PPO | Admitting: *Deleted

## 2015-11-05 DIAGNOSIS — E7800 Pure hypercholesterolemia, unspecified: Secondary | ICD-10-CM

## 2015-11-05 DIAGNOSIS — Z79899 Other long term (current) drug therapy: Secondary | ICD-10-CM | POA: Diagnosis not present

## 2015-11-05 LAB — LIPID PANEL
CHOLESTEROL: 195 mg/dL (ref 125–200)
HDL: 38 mg/dL — ABNORMAL LOW (ref >=46)
LDL Cholesterol: 113 mg/dL (ref <130)
TRIGLYCERIDES: 221 mg/dL — AB (ref <150)
Total CHOL/HDL Ratio: 5.1 Ratio — ABNORMAL HIGH (ref <=5.0)
VLDL: 44 mg/dL — ABNORMAL HIGH (ref <30)

## 2015-11-05 LAB — ALT: ALT: 9 U/L (ref 6–29)

## 2015-11-12 ENCOUNTER — Encounter: Payer: Self-pay | Admitting: Cardiology

## 2015-11-12 ENCOUNTER — Encounter (INDEPENDENT_AMBULATORY_CARE_PROVIDER_SITE_OTHER): Payer: Self-pay

## 2015-11-12 ENCOUNTER — Ambulatory Visit (INDEPENDENT_AMBULATORY_CARE_PROVIDER_SITE_OTHER): Payer: PPO | Admitting: Cardiology

## 2015-11-12 VITALS — BP 118/68 | HR 66 | Ht 64.0 in | Wt 147.0 lb

## 2015-11-12 DIAGNOSIS — E7800 Pure hypercholesterolemia, unspecified: Secondary | ICD-10-CM

## 2015-11-12 DIAGNOSIS — I2510 Atherosclerotic heart disease of native coronary artery without angina pectoris: Secondary | ICD-10-CM | POA: Diagnosis not present

## 2015-11-12 DIAGNOSIS — Z889 Allergy status to unspecified drugs, medicaments and biological substances status: Secondary | ICD-10-CM

## 2015-11-12 DIAGNOSIS — Z79899 Other long term (current) drug therapy: Secondary | ICD-10-CM

## 2015-11-12 DIAGNOSIS — Z789 Other specified health status: Secondary | ICD-10-CM

## 2015-11-12 NOTE — Progress Notes (Signed)
[facility]. [address], [address] [place], [place]  [address] Phone: [phone] Fax:  [phone]  Date:  11/12/2015   ID:  Kathleen Williamson, DOB 09-16-1940, MRN [id number]  PCP:  [name], MD   History of Present Illness: Kathleen Williamson is a 75 y.o. female with coronary artery disease status post bypass in 2003 (CABG-LIMA-LAD, free right IMA to PDA,SVG-diagonal, SVG-OM 2-OM 5 03/2002), carotid artery disease, dermatomyositis, depression, hyperlipidemia, hypothyroidism here for followup. Former patient of Dr. Leonia Reeves. Nuclear stress test in 2012 showed no ischemia, 7 minutes exercise time.   She denies any active discomfort, shortness of breath.  She repeated to me her story of her symptoms prior to bypass. She was singing in the church choir, felt an elephant sitting on her chest, felt poorly, pulse was erratic. She did not to the hospital at that time. The next day she saw an internist who sent her to Dr. Leonia Reeves.   Her husband died in 2013-09-23 had grieving. Lives at [facility]. Enjoys it there.   In the past, she has tried Lipitor, Zocor, Crestor but these cause body aches as she had increased dosing. She is tolerating pravastatin 80 mg   Has a Baker's cyst on left knee. This has limited her walking. She is about to start Weight [employer]. Overall she is doing quite well.   Wt Readings from Last 3 Encounters:  11/12/15 147 lb (66.7 kg)  08/01/15 134 lb 12.8 oz (61.1 kg)  07/03/15 144 lb 6.4 oz (65.5 kg)     Past Medical History:  Diagnosis Date  . Arthritis    fingers  . CAD (coronary artery disease) 2003   MI s/p 5v CABG  . Carotid stenosis    [place]  . CKD (chronic kidney disease) stage 3, GFR 30-59 ml/min   . Dermatomyositis [facility])    saw Dr Estanislado Pandy, improved on its own  . History of chicken pox   . History of measles   . History of migraine none since 2003  . HLD (hyperlipidemia)   . Hypothyroidism   . MDD (major depressive disorder), recurrent  episode, moderate ([place])    h/o SI after lost husband unexpectedly 2015  . Microscopic colitis 05/2014   by colonoscopy, lymphocytic, zoloft related  . Osteoporosis    DEXA 03/2013 T -3.1, DEXA 10//2016 -2.9 hip, -2.2 spine  . Pancreatic cyst    Dr Ralene Ok, no f/u needed  . Prediabetes 2014  . Subclavian steal syndrome   . Urine incontinence     Past Surgical History:  Procedure Laterality Date  . APPENDECTOMY  1997   with hysterectomy  . [place]   lower  . BREAST LUMPECTOMY Left 1977   benign  . COLONOSCOPY WITH PROPOFOL N/A 05/23/2014   microscopic colitis - Garlan Fair, MD  . CORONARY ARTERY BYPASS GRAFT  2003   x 5 v  . ESOPHAGOGASTRODUODENOSCOPY (EGD) WITH PROPOFOL N/A 05/23/2014   WNL Garlan Fair, MD  . [place] Right 1990's  . TOOTH EXTRACTION  yrs ago  . TOTAL ABDOMINAL HYSTERECTOMY W/ BILATERAL SALPINGOOPHORECTOMY  1997   endometriosis    Current Outpatient Prescriptions  Medication Sig Dispense Refill  . aspirin EC 81 MG tablet Take 81 mg by mouth at bedtime.    . Calcium Carbonate-Vitamin D 600-400 MG-UNIT chew tablet Chew 1 tablet by mouth daily.    . clonazePAM (KLONOPIN) 0.5 MG tablet TAKE 1 TABLET  BY MOUTH TWICE A DAY as needed 60 tablet 3  . Coenzyme Q10 (COQ10) 100 MG CAPS Take 1 capsule by mouth at bedtime.     Marland Kitchen levothyroxine (SYNTHROID, LEVOTHROID) 50 MCG tablet TAKE 1 TABLET EVERY DAY 90 tablet 3  . loperamide (IMODIUM A-D) 2 MG tablet Take 2 mg by mouth every morning.    . mirtazapine (REMERON) 15 MG tablet TAKE 1 TABLET BEFORE BEDTIME AT BEDTIME ORALLY 30 DAY(S) 30 tablet 9  . nitroGLYCERIN (NITROLINGUAL) 0.4 MG/SPRAY spray Place 1 spray under the tongue every 5 (five) minutes as needed. 12 g prn  . pravastatin (PRAVACHOL) 80 MG tablet Take 1 tablet (80 mg total) by mouth every evening. 30 tablet 11  . sertraline (ZOLOFT) 100 MG tablet TAKE 1 TABLET BY MOUTH EVERY DAY 90 tablet 3  . vitamin B-12 (CYANOCOBALAMIN) 1000 MCG  tablet Take 1,000 mcg by mouth daily.     No current facility-administered medications for this visit.     Allergies:    Allergies  Allergen Reactions  . Sulfa Antibiotics Nausea Only  . Crestor [Rosuvastatin Calcium] Other (See Comments)    Leg ache    Social History:  The patient  reports that she has never smoked. She has never used smokeless tobacco. She reports that she does not drink alcohol or use drugs.   Family History  Problem Relation Age of Onset  . Cancer Maternal Aunt     breast  . CAD Father 57  . CAD Mother 70  . CAD Other     strong paternal side  . Diabetes Mother   . Stroke Neg Hx   . Rheum arthritis Mother     ROS:  Please see the history of present illness.   Denies any fevers, chills, orthopnea, PND.  All other systems reviewed and negative.   PHYSICAL EXAM: VS:  BP 118/68   Pulse 66   Ht 5\' 4"  (1.626 m)   Wt 147 lb (66.7 kg)   BMI 25.23 kg/m  Well nourished, well developed, in no acute distress  HEENT: normal, [place]/AT, EOMI Neck: no JVD, normal carotid upstroke, no bruit Cardiac:  normal S1, S2; RRR; no murmur  Lungs:  clear to auscultation bilaterally, no wheezing, rhonchi or rales  Abd: soft, nontender, no hepatomegaly, no bruits  Ext: no edema, 2+ distal pulses Skin: warm and dry  GU: deferred Neuro: no focal abnormalities noted, AAO x 3  EKG:   02/19/15-sinus rhythm, 66, nonspecific ST-T wave changes personally viewed-prior 12/13/13-sinus rhythm, PVC, nonspecific ST-T wave changes, heart rate 69, QTC 454 ms      ASSESSMENT AND PLAN:  1. Coronary artery disease-status post bypass in 03/2002 x 5. Overall doing well without any anginal symptoms. One episode of chest pressure which she has not had since stopping Crestor. Continuing with secondary prevention. 2. Dermatomyositis-symptoms originally in 2010. Apparently this has resolved. 3. Hyperlipidemia--off Niaspan. Tried lipitor and zocor but felt body aches. Crestor - leg aches. LDL went from  128 down to 88 with Crestor but she did not tolerate. She is now back on pravastatin 80 mg once a day. We could consider Zetia addition in the future.For now, I will continue with current plan. 4. Statin intolerance-Crestor, Lipitor, Zocor.Seems to be tolerating pravastatin currently. We will see her back in 6 month follow up.  Signed, Candee Furbish, MD [facility]  11/12/2015 2:50 PM

## 2015-11-12 NOTE — Patient Instructions (Signed)

## 2015-11-30 ENCOUNTER — Telehealth: Payer: Self-pay | Admitting: Family Medicine

## 2015-11-30 NOTE — Telephone Encounter (Signed)
plz schedule next available appt with Dr Lorelei Pont in known baker's cyst with meniscal tear or have her f/u with her ortho. Thanks.

## 2015-11-30 NOTE — Telephone Encounter (Signed)
[facility] Patient Name: RYLAH HEMPLE DOB: 09/12/1940 Initial Comment knot behind her left knee, knee is swollen, during the day there seems to be spots that swell up with fluid. hard to walk on it Nurse Assessment Nurse: Dimas Chyle, RN, Dellis Filbert Date/Time Eilene Ghazi Time): 11/30/2015 10:43:49 AM Confirm and document reason for call. If symptomatic, describe symptoms. You must click the next button to save text entered. ---Knot behind her left knee, knee is swollen, during the day there seems to be spots that swell up with fluid. hard to walk on it. Symptoms started earlier in the summer. Had been previously seen. Has the patient traveled out of the country within the last 30 days? ---No Does the patient have any new or worsening symptoms? ---Yes Will a triage be completed? ---Yes Related visit to physician within the last 2 weeks? ---No Does the PT have any chronic conditions? (i.e. diabetes, asthma, etc.) ---No Is this a behavioral health or substance abuse call? ---No Guidelines Guideline Title Affirmed Question Affirmed Notes Knee Swelling [1] Painless swelling behind knee AND [2] bulges out when knee is straightened (extended) Final Disposition User See PCP When Office is Open (within 3 days) Dimas Chyle, Therapist, sports, Dellis Filbert Comments [name] refused 3 day outcome and wanted to be seen by Dr. Lorelei Pont who didn't have any appointments available. Patient is requesting call back. Referrals REFERRED TO PCP OFFICE Disagree/Comply: Comply

## 2015-12-03 NOTE — Telephone Encounter (Signed)
Left message on answering machine at home number to call back. 

## 2015-12-05 NOTE — Telephone Encounter (Signed)
Message left for patient to call and schedule appt

## 2016-02-02 ENCOUNTER — Other Ambulatory Visit: Payer: Self-pay | Admitting: Family Medicine

## 2016-02-02 DIAGNOSIS — E7800 Pure hypercholesterolemia, unspecified: Secondary | ICD-10-CM

## 2016-02-02 DIAGNOSIS — R7303 Prediabetes: Secondary | ICD-10-CM

## 2016-02-02 DIAGNOSIS — M810 Age-related osteoporosis without current pathological fracture: Secondary | ICD-10-CM

## 2016-02-02 DIAGNOSIS — K863 Pseudocyst of pancreas: Secondary | ICD-10-CM

## 2016-02-02 DIAGNOSIS — E039 Hypothyroidism, unspecified: Secondary | ICD-10-CM

## 2016-02-02 DIAGNOSIS — N183 Chronic kidney disease, stage 3 unspecified: Secondary | ICD-10-CM

## 2016-02-04 ENCOUNTER — Ambulatory Visit (INDEPENDENT_AMBULATORY_CARE_PROVIDER_SITE_OTHER): Payer: PPO

## 2016-02-04 ENCOUNTER — Other Ambulatory Visit (INDEPENDENT_AMBULATORY_CARE_PROVIDER_SITE_OTHER): Payer: PPO

## 2016-02-04 VITALS — BP 114/68 | HR 71 | Temp 97.5°F | Ht 62.75 in | Wt 146.0 lb

## 2016-02-04 DIAGNOSIS — Z23 Encounter for immunization: Secondary | ICD-10-CM | POA: Diagnosis not present

## 2016-02-04 DIAGNOSIS — R7989 Other specified abnormal findings of blood chemistry: Secondary | ICD-10-CM

## 2016-02-04 DIAGNOSIS — Z0000 Encounter for general adult medical examination without abnormal findings: Secondary | ICD-10-CM

## 2016-02-04 DIAGNOSIS — K863 Pseudocyst of pancreas: Secondary | ICD-10-CM

## 2016-02-04 DIAGNOSIS — E039 Hypothyroidism, unspecified: Secondary | ICD-10-CM

## 2016-02-04 DIAGNOSIS — N183 Chronic kidney disease, stage 3 unspecified: Secondary | ICD-10-CM

## 2016-02-04 DIAGNOSIS — M810 Age-related osteoporosis without current pathological fracture: Secondary | ICD-10-CM | POA: Diagnosis not present

## 2016-02-04 DIAGNOSIS — R7303 Prediabetes: Secondary | ICD-10-CM

## 2016-02-04 DIAGNOSIS — E7800 Pure hypercholesterolemia, unspecified: Secondary | ICD-10-CM

## 2016-02-04 LAB — CBC WITH DIFFERENTIAL/PLATELET
BASOS ABS: 0 10*3/uL (ref 0.0–0.1)
Basophils Relative: 0.5 % (ref 0.0–3.0)
Eosinophils Absolute: 0.4 10*3/uL (ref 0.0–0.7)
Eosinophils Relative: 5.3 % — ABNORMAL HIGH (ref 0.0–5.0)
HEMATOCRIT: 37.4 % (ref 36.0–46.0)
HEMOGLOBIN: 12.5 g/dL (ref 12.0–15.0)
LYMPHS PCT: 28 % (ref 12.0–46.0)
Lymphs Abs: 2.1 10*3/uL (ref 0.7–4.0)
MCHC: 33.3 g/dL (ref 30.0–36.0)
MCV: 83.9 fl (ref 78.0–100.0)
MONOS PCT: 6.5 % (ref 3.0–12.0)
Monocytes Absolute: 0.5 10*3/uL (ref 0.1–1.0)
NEUTROS ABS: 4.5 10*3/uL (ref 1.4–7.7)
Neutrophils Relative %: 59.7 % (ref 43.0–77.0)
PLATELETS: 273 10*3/uL (ref 150.0–400.0)
RBC: 4.46 Mil/uL (ref 3.87–5.11)
RDW: 14 % (ref 11.5–15.5)
WBC: 7.6 10*3/uL (ref 4.0–10.5)

## 2016-02-04 LAB — COMPREHENSIVE METABOLIC PANEL
ALBUMIN: 4.2 g/dL (ref 3.5–5.2)
ALT: 9 U/L (ref 0–35)
AST: 15 U/L (ref 0–37)
Alkaline Phosphatase: 60 U/L (ref 39–117)
BUN: 21 mg/dL (ref 6–23)
CHLORIDE: 105 meq/L (ref 96–112)
CO2: 27 mEq/L (ref 19–32)
Calcium: 9.9 mg/dL (ref 8.4–10.5)
Creatinine, Ser: 1.37 mg/dL — ABNORMAL HIGH (ref 0.40–1.20)
GFR: 40.04 mL/min — AB (ref >60.00)
GLUCOSE: 129 mg/dL — AB (ref 70–99)
POTASSIUM: 4.4 meq/L (ref 3.5–5.1)
SODIUM: 138 meq/L (ref 135–145)
Total Bilirubin: 0.5 mg/dL (ref 0.2–1.2)
Total Protein: 7 g/dL (ref 6.0–8.3)

## 2016-02-04 LAB — MICROALBUMIN / CREATININE URINE RATIO
Creatinine,U: 55.7 mg/dL
MICROALB/CREAT RATIO: 1.3 mg/g (ref 0.0–30.0)
Microalb, Ur: <0.7 mg/dL (ref 0.0–1.9)

## 2016-02-04 LAB — LIPID PANEL
Cholesterol: 203 mg/dL — ABNORMAL HIGH (ref 0–200)
HDL: 36.5 mg/dL — AB (ref >39.00)
NONHDL: 166.75
Total CHOL/HDL Ratio: 6
Triglycerides: 263 mg/dL — ABNORMAL HIGH (ref 0.0–149.0)
VLDL: 52.6 mg/dL — AB (ref 0.0–40.0)

## 2016-02-04 LAB — HEMOGLOBIN A1C: Hgb A1c MFr Bld: 6.7 % — ABNORMAL HIGH (ref 4.6–6.5)

## 2016-02-04 LAB — TSH: TSH: 2.12 u[IU]/mL (ref 0.35–4.50)

## 2016-02-04 LAB — VITAMIN D 25 HYDROXY (VIT D DEFICIENCY, FRACTURES): VITD: 37.07 ng/mL (ref 30.00–100.00)

## 2016-02-04 LAB — LDL CHOLESTEROL, DIRECT: Direct LDL: 124 mg/dL

## 2016-02-04 NOTE — Progress Notes (Signed)
Subjective:   Kathleen Williamson is a 75 y.o. female who presents for Medicare Annual (Subsequent) preventive examination.  Review of Systems:  N/A Cardiac Risk Factors include: advanced age (>26men, >84 women);dyslipidemia     Objective:     Vitals: BP 114/68 (BP Location: Left Arm, Patient Position: Sitting, Cuff Size: Normal)   Pulse 71   Temp 97.5 F (36.4 C) (Oral)   Ht 5' 2.75" (1.594 m) Comment: no shoes  Wt 146 lb (66.2 kg)   SpO2 98%   BMI 26.07 kg/m   Body mass index is 26.07 kg/m.   Tobacco History  Smoking Status  . Never Smoker  Smokeless Tobacco  . Never Used     Counseling given: No   Past Medical History:  Diagnosis Date  . Arthritis    fingers  . CAD (coronary artery disease) 2003   MI s/p 5v CABG  . Carotid stenosis    [place]  . CKD (chronic kidney disease) stage 3, GFR 30-59 ml/min   . Dermatomyositis [facility])    saw Dr Estanislado Pandy, improved on its own  . History of chicken pox   . History of measles   . History of migraine none since 2003  . HLD (hyperlipidemia)   . Hypothyroidism   . MDD (major depressive disorder), recurrent episode, moderate ([place])    h/o SI after lost husband unexpectedly 2015  . Microscopic colitis 05/2014   by colonoscopy, lymphocytic, zoloft related  . Osteoporosis    DEXA 03/2013 T -3.1, DEXA 10//2016 -2.9 hip, -2.2 spine  . Pancreatic cyst    Dr Ralene Ok, no f/u needed  . Prediabetes 2014  . Subclavian steal syndrome   . Urine incontinence    Past Surgical History:  Procedure Laterality Date  . APPENDECTOMY  1997   with hysterectomy  . [place]   lower  . BREAST LUMPECTOMY Left 1977   benign  . COLONOSCOPY WITH PROPOFOL N/A 05/23/2014   microscopic colitis - Garlan Fair, MD  . CORONARY ARTERY BYPASS GRAFT  2003   x 5 v  . ESOPHAGOGASTRODUODENOSCOPY (EGD) WITH PROPOFOL N/A 05/23/2014   WNL Garlan Fair, MD  . [place] Right 1990's  . TOOTH EXTRACTION  yrs ago  . TOTAL ABDOMINAL  HYSTERECTOMY W/ BILATERAL SALPINGOOPHORECTOMY  1997   endometriosis   Family History  Problem Relation Age of Onset  . CAD Father 29  . CAD Mother 40  . Diabetes Mother   . Rheum arthritis Mother   . Cancer Maternal Aunt     breast  . CAD Other     strong paternal side  . Stroke Neg Hx    History  Sexual Activity  . Sexual activity: Not on file    Outpatient Encounter Prescriptions as of 02/04/2016  Medication Sig  . Ascorbic Acid (VITAMIN C) 1000 MG tablet Take 1,000 mg by mouth daily.  Marland Kitchen aspirin EC 81 MG tablet Take 81 mg by mouth at bedtime.  . Calcium Carbonate-Vitamin D 600-400 MG-UNIT chew tablet Chew 1 tablet by mouth daily.  . clonazePAM (KLONOPIN) 0.5 MG tablet TAKE 1 TABLET BY MOUTH TWICE A DAY as needed  . Coenzyme Q10 (COQ10) 100 MG CAPS Take 1 capsule by mouth at bedtime.   Marland Kitchen levothyroxine (SYNTHROID, LEVOTHROID) 50 MCG tablet TAKE 1 TABLET EVERY DAY  . loperamide (IMODIUM A-D) 2 MG tablet Take 2 mg by mouth every morning.  . mirtazapine (REMERON) 15 MG tablet TAKE 1 TABLET  BEFORE BEDTIME AT BEDTIME ORALLY 30 DAY(S)  . nitroGLYCERIN (NITROLINGUAL) 0.4 MG/SPRAY spray Place 1 spray under the tongue every 5 (five) minutes as needed.  . pravastatin (PRAVACHOL) 80 MG tablet Take 1 tablet (80 mg total) by mouth every evening.  . sertraline (ZOLOFT) 100 MG tablet TAKE 1 TABLET BY MOUTH EVERY DAY  . vitamin B-12 (CYANOCOBALAMIN) 1000 MCG tablet Take 1,000 mcg by mouth daily.   No facility-administered encounter medications on file as of 02/04/2016.     Activities of Daily Living In your present state of health, do you have any difficulty performing the following activities: 02/04/2016  Hearing? N  Vision? N  Difficulty concentrating or making decisions? N  Walking or climbing stairs? N  Dressing or bathing? N  Doing errands, shopping? N  Preparing Food and eating ? N  Using the Toilet? N  In the past six months, have you accidently leaked urine? Y  Do you have  problems with loss of bowel control? N  Managing your Medications? N  Managing your Finances? N  Housekeeping or managing your Housekeeping? N  Some recent data might be hidden    Patient Care Team: [name], MD as PCP - General (Family Medicine)    Assessment:     Hearing Screening   125Hz  250Hz  500Hz  1000Hz  2000Hz  3000Hz  4000Hz  6000Hz  8000Hz   Right ear:   40 0 40  0    Left ear:   40 40 40  40    Vision Screening Comments: Last vision exam within previous 12 mths   Exercise Activities and Dietary recommendations Current Exercise Habits: Home exercise routine, Type of exercise: walking, Time (Minutes): 30, Frequency (Times/Week): 7, Weekly Exercise (Minutes/Week): 210, Intensity: Moderate, Exercise limited by: None identified  Goals    . Increase physical activity          Starting 02/04/16, I will continue to walk at least 30 min daily as weather permits.       Fall Risk Fall Risk  02/04/2016 01/31/2015  Falls in the past year? No No   Depression Screen PHQ 2/9 Scores 02/04/2016  PHQ - 2 Score 2  PHQ- 9 Score 3     Cognitive Function MMSE - Mini Mental State Exam 02/04/2016  Orientation to time 5  Orientation to Place 5  Registration 3  Attention/ Calculation 0  Recall 3  Language- name 2 objects 0  Language- repeat 1  Language- follow 3 step command 3  Language- read & follow direction 0  Write a sentence 0  Copy design 0  Total score 20     PLEASE NOTE: A Mini-Cog screen was completed. Maximum score is 20. A value of 0 denotes this part of [place] MMSE was not completed or the patient failed this part of the Mini-Cog screening.   Mini-Cog Screening Orientation to Time - Max 5 pts Orientation to Place - Max 5 pts Registration - Max 3 pts Recall - Max 3 pts Language Repeat - Max 1 pts Language Follow 3 Step Command - Max 3 pts     Immunization History  Administered Date(s) Administered  . Influenza,inj,Quad PF,36+ Mos 01/22/2015,  02/04/2016  . Influenza-Unspecified 04/01/2011  . Pneumococcal Conjugate-13 02/04/2016  . Pneumococcal-Unspecified 03/24/2007  . Td 03/24/2007  . Zoster 04/14/2009   Screening Tests Health Maintenance  Topic Date Due  . DTaP/Tdap/Td (1 - Tdap) 03/23/2017 (Originally 03/25/2007)  . MAMMOGRAM  02/12/2016  . TETANUS/TDAP  03/23/2017  . COLONOSCOPY  05/23/2024  . INFLUENZA  VACCINE  Completed  . DEXA SCAN  Completed  . ZOSTAVAX  Completed  . PNA vac Low Risk Adult  Completed      Plan:     I have personally reviewed and addressed the Medicare Annual Wellness questionnaire and have noted the following in the patient's chart:  A. Medical and social history B. Use of alcohol, tobacco or illicit drugs  C. Current medications and supplements D. Functional ability and status E.  Nutritional status F.  Physical activity G. Advance directives H. List of other physicians I.  Hospitalizations, surgeries, and ER visits in previous 12 months J.  [place] to include hearing, vision, cognitive, depression L. Referrals and appointments - none  In addition, I have reviewed and discussed with patient certain preventive protocols, quality metrics, and best practice recommendations. A written personalized care plan for preventive services as well as general preventive health recommendations were provided to patient.  See attached scanned questionnaire for additional information.   Signed,   Lindell Noe, MHA, BS, LPN Health Coach

## 2016-02-04 NOTE — Patient Instructions (Signed)
Ms. Boerst , Thank you for taking time to come for your Medicare Wellness Visit. I appreciate your ongoing commitment to your health goals. Please review the following plan we discussed and let me know if I can assist you in the future.   These are the goals we discussed: Goals    . Increase physical activity          Starting 02/04/16, I will continue to walk at least 30 min daily as weather permits.        This is a list of the screening recommended for you and due dates:  Health Maintenance  Topic Date Due  . DTaP/Tdap/Td vaccine (1 - Tdap) 03/23/2017*  . Mammogram  02/12/2016  . Tetanus Vaccine  03/23/2017  . Colon Cancer Screening  05/23/2024  . Flu Shot  Completed  . DEXA scan (bone density measurement)  Completed  . Shingles Vaccine  Completed  . Pneumonia vaccines  Completed  *Topic was postponed. The date shown is not the original due date.   Preventive Care for Adults  A healthy lifestyle and preventive care can promote health and wellness. Preventive health guidelines for adults include the following key practices.  . A routine yearly physical is a good way to check with your health care provider about your health and preventive screening. It is a chance to share any concerns and updates on your health and to receive a thorough exam.  . Visit your dentist for a routine exam and preventive care every 6 months. Brush your teeth twice a day and floss once a day. Good oral hygiene prevents tooth decay and gum disease.  . The frequency of eye exams is based on your age, health, family medical history, use  of contact lenses, and other factors. Follow your health care provider's ecommendations for frequency of eye exams.  . Eat a healthy diet. Foods like vegetables, fruits, whole grains, low-fat dairy products, and lean protein foods contain the nutrients you need without too many calories. Decrease your intake of foods high in solid fats, added sugars, and salt. Eat the  right amount of calories for you. Get information about a proper diet from your health care provider, if necessary.  . Regular physical exercise is one of the most important things you can do for your health. Most adults should get at least 150 minutes of moderate-intensity exercise (any activity that increases your heart rate and causes you to sweat) each week. In addition, most adults need muscle-strengthening exercises on 2 or more days a week.  Silver Sneakers may be a benefit available to you. To determine eligibility, you may visit the website: www.silversneakers.com or contact program at (614) 676-8124 Mon-Fri between 8AM-8PM.   . Maintain a healthy weight. The body mass index (BMI) is a screening tool to identify possible weight problems. It provides an estimate of body fat based on height and weight. Your health care provider can find your BMI and can help you achieve or maintain a healthy weight.   For adults 20 years and older: ? A BMI below 18.5 is considered underweight. ? A BMI of 18.5 to 24.9 is normal. ? A BMI of 25 to 29.9 is considered overweight. ? A BMI of 30 and above is considered obese.   . Maintain normal blood lipids and cholesterol levels by exercising and minimizing your intake of saturated fat. Eat a balanced diet with plenty of fruit and vegetables. Blood tests for lipids and cholesterol should begin at age 43 and  be repeated every 5 years. If your lipid or cholesterol levels are high, you are over 50, or you are at high risk for heart disease, you may need your cholesterol levels checked more frequently. Ongoing high lipid and cholesterol levels should be treated with medicines if diet and exercise are not working.  . If you smoke, find out from your health care provider how to quit. If you do not use tobacco, please do not start.  . If you choose to drink alcohol, please do not consume more than 2 drinks per day. One drink is considered to be 12 ounces (355 mL) of  beer, 5 ounces (148 mL) of wine, or 1.5 ounces (44 mL) of liquor.  . If you are 74-38 years old, ask your health care provider if you should take aspirin to prevent strokes.  . Use sunscreen. Apply sunscreen liberally and repeatedly throughout the day. You should seek shade when your shadow is shorter than you. Protect yourself by wearing Novacek sleeves, pants, a wide-brimmed hat, and sunglasses year round, whenever you are outdoors.  . Once a month, do a whole body skin exam, using a mirror to look at the skin on your back. Tell your health care provider of new moles, moles that have irregular borders, moles that are larger than a pencil eraser, or moles that have changed in shape or color.

## 2016-02-04 NOTE — Progress Notes (Signed)
PCP notes:   Health maintenance:  PCV13 - administered Flu vaccine - administered  Abnormal screenings:   Hearing - failed Depression score: 3  Patient concerns:  Pt has developed a "Baker's cyst" on back of left knee. Onset: Spring 2017. Pt would like to discuss an ortho consult.   Nurse concerns:  None  Next PCP appt:   02/07/16 @ 1130

## 2016-02-04 NOTE — Progress Notes (Signed)
Pre visit review using our clinic review tool, if applicable. No additional management support is needed unless otherwise documented below in the visit note. 

## 2016-02-05 NOTE — Progress Notes (Signed)
I reviewed health advisor's note, was available for consultation, and agree with documentation and plan.  

## 2016-02-07 ENCOUNTER — Encounter: Payer: Self-pay | Admitting: Family Medicine

## 2016-02-07 ENCOUNTER — Ambulatory Visit (INDEPENDENT_AMBULATORY_CARE_PROVIDER_SITE_OTHER): Payer: PPO | Admitting: Family Medicine

## 2016-02-07 VITALS — BP 120/68 | HR 68 | Temp 97.5°F | Wt 147.0 lb

## 2016-02-07 DIAGNOSIS — M3313 Other dermatomyositis without myopathy: Secondary | ICD-10-CM

## 2016-02-07 DIAGNOSIS — I2510 Atherosclerotic heart disease of native coronary artery without angina pectoris: Secondary | ICD-10-CM

## 2016-02-07 DIAGNOSIS — F331 Major depressive disorder, recurrent, moderate: Secondary | ICD-10-CM | POA: Diagnosis not present

## 2016-02-07 DIAGNOSIS — E039 Hypothyroidism, unspecified: Secondary | ICD-10-CM

## 2016-02-07 DIAGNOSIS — K52839 Microscopic colitis, unspecified: Secondary | ICD-10-CM

## 2016-02-07 DIAGNOSIS — I6502 Occlusion and stenosis of left vertebral artery: Secondary | ICD-10-CM

## 2016-02-07 DIAGNOSIS — M3390 Dermatopolymyositis, unspecified, organ involvement unspecified: Secondary | ICD-10-CM

## 2016-02-07 DIAGNOSIS — M7122 Synovial cyst of popliteal space [Baker], left knee: Secondary | ICD-10-CM

## 2016-02-07 DIAGNOSIS — I6523 Occlusion and stenosis of bilateral carotid arteries: Secondary | ICD-10-CM

## 2016-02-07 DIAGNOSIS — E7800 Pure hypercholesterolemia, unspecified: Secondary | ICD-10-CM | POA: Diagnosis not present

## 2016-02-07 DIAGNOSIS — Z0000 Encounter for general adult medical examination without abnormal findings: Secondary | ICD-10-CM | POA: Diagnosis not present

## 2016-02-07 DIAGNOSIS — N183 Chronic kidney disease, stage 3 unspecified: Secondary | ICD-10-CM

## 2016-02-07 DIAGNOSIS — M810 Age-related osteoporosis without current pathological fracture: Secondary | ICD-10-CM

## 2016-02-07 DIAGNOSIS — Z7189 Other specified counseling: Secondary | ICD-10-CM | POA: Diagnosis not present

## 2016-02-07 DIAGNOSIS — E118 Type 2 diabetes mellitus with unspecified complications: Secondary | ICD-10-CM

## 2016-02-07 NOTE — Assessment & Plan Note (Signed)
Stable, in remission

## 2016-02-07 NOTE — Assessment & Plan Note (Signed)
Preventative protocols reviewed and updated unless pt declined. Discussed healthy diet and lifestyle.  

## 2016-02-07 NOTE — Assessment & Plan Note (Signed)
Continue aspirin, statin. Followed by cards Gillian Shields)

## 2016-02-07 NOTE — Assessment & Plan Note (Addendum)
Continues sertraline, controlled with daily imodium.

## 2016-02-07 NOTE — Assessment & Plan Note (Signed)
R bruit. Update [name]. Prior <50% stenosis (2015)

## 2016-02-07 NOTE — Assessment & Plan Note (Signed)
Chronic, stable. Continue current regimen. 

## 2016-02-07 NOTE — Progress Notes (Signed)
Pre visit review using our clinic review tool, if applicable. No additional management support is needed unless otherwise documented below in the visit note. 

## 2016-02-07 NOTE — Assessment & Plan Note (Signed)
H/o high grade stenosis at origin of L vertebral artery.

## 2016-02-07 NOTE — Assessment & Plan Note (Signed)
Chronic, reviewed with patient.

## 2016-02-07 NOTE — Assessment & Plan Note (Addendum)
Advanced directive discussion - in process of setting this up with attorney. Will bring [name] copy. Very difficult to complete since husband's passing. Thinking of getting [place] involved.

## 2016-02-07 NOTE — Progress Notes (Signed)
BP 120/68 (BP Location: Left Arm, Patient Position: Sitting, Cuff Size: Normal)   Pulse 68   Temp 97.5 F (36.4 C) (Oral)   Wt 147 lb (66.7 kg)   SpO2 98%   BMI 26.25 kg/m    CC: CPE Subjective:    Patient ID: Kathleen Williamson, female    DOB: [birth year], 75 y.o.   MRN: [id number]  HPI: [name] is a 75 y.o. female presenting on 02/07/2016 for Medicare Wellness (Part 2. Had an illness about 2-3 weeks ago. Cannot get strength back. Has a Baker's Cyst behind left knee. Wants to know about taking Biotin and Turmeric.)   Saw Lesia last week for medicare wellness visit, note reviewed.   URI symptoms over 2 wks ago, since then has noted ongoing fatigue that improves after 30 min nap. Denies muscle weakness or myalgias. Cold sxs have resolved - treated with tylenol and robitussin DM. Encouraged restart b12 vitamin.  She has symptomatic L baker's cyst since the spring, and would like referral for further management. Affecting her ability to stay active walking. No recent prolonged immobility, no h/o blood clots, no swelling of leg. H/o complex baker's cyst with medial meniscal tear by MRI 07/2015 that improved after steroid injection.   Asks if she can take biotin or turmeric.   H/o dermatomyositis which has been quiescent for several years. Has previously seen Dr Estanislado Pandy.   Chronic diarrhea - possibly due to zoloft (microscopic colitis) controlled with imodium daily.  Preventative: Colonoscopy 05/23/2014 microscopic colitis attributed to zoloft (Dr Wynetta Emery at [place]) Lung cancer screening - never smoker Breast cancer screening - 01/2015, rpt has been scheduled. Well woman with pap 03/2014 ([facility]) - will not return. S/p complete hysterectomy with B oophorectomy for endometriosis.  DEXA scan - 01/2015 -2.9 hip, -2.2 spine. Does not want bisphosphonate.  Flu shot - yearly Td - 2008 Pneumovax 2008, prevnar 2017 Shingles shot - 2011 Advanced directive discussion - in process of  setting this up with attorney. Will bring [name] copy. Very difficult to complete since husband's passing. Thinking of getting [place] involved.  Seat belt use discussed Sunscreen use discussed, no changing moles on skin Non smoker Alcohol - none  Lives alone at [facility]. Husband of 41 yrs died suddenly 10/24/13.  No children, no siblings.  Close friends Bethena Roys and Ace Gins nearby, cousin in [place]. [place]: [place] Diet: poor - lots of frozen dinners, eating out  Relevant past medical, surgical, family and social history reviewed and updated as indicated. Interim medical history since our last visit reviewed. Allergies and medications reviewed and updated. Current Outpatient Prescriptions on File Prior to Visit  Medication Sig  . Ascorbic Acid (VITAMIN C) 1000 MG tablet Take 1,000 mg by mouth daily.  Marland Kitchen aspirin EC 81 MG tablet Take 81 mg by mouth at bedtime.  . Calcium Carbonate-Vitamin D 600-400 MG-UNIT chew tablet Chew 1 tablet by mouth daily. (Patient taking differently: Chew 1 tablet by mouth 2 (two) times daily. )  . clonazePAM (KLONOPIN) 0.5 MG tablet TAKE 1 TABLET BY MOUTH TWICE A DAY as needed  . Coenzyme Q10 (COQ10) 100 MG CAPS Take 1 capsule by mouth at bedtime.   Marland Kitchen levothyroxine (SYNTHROID, LEVOTHROID) 50 MCG tablet TAKE 1 TABLET EVERY DAY  . loperamide (IMODIUM A-D) 2 MG tablet Take 2 mg by mouth every morning.  . mirtazapine (REMERON) 15 MG tablet TAKE 1 TABLET BEFORE BEDTIME AT BEDTIME ORALLY 30 DAY(S)  . nitroGLYCERIN (  NITROLINGUAL) 0.4 MG/SPRAY spray Place 1 spray under the tongue every 5 (five) minutes as needed.  . pravastatin (PRAVACHOL) 80 MG tablet Take 1 tablet (80 mg total) by mouth every evening.  . sertraline (ZOLOFT) 100 MG tablet TAKE 1 TABLET BY MOUTH EVERY DAY  . vitamin B-12 (CYANOCOBALAMIN) 1000 MCG tablet Take 1,000 mcg by mouth daily.   No current facility-administered medications on file prior to visit.     Review of Systems   Constitutional: Negative for activity change, appetite change, chills, fatigue, fever and unexpected weight change.  HENT: Positive for congestion (recent cold sxs) and rhinorrhea. Negative for hearing loss.   Eyes: Negative for visual disturbance.  Respiratory: Negative for cough, chest tightness, shortness of breath and wheezing.   Cardiovascular: Negative for chest pain, palpitations and leg swelling.  Gastrointestinal: Negative for abdominal distention, abdominal pain, blood in stool, constipation, diarrhea, nausea and vomiting.  Genitourinary: Negative for difficulty urinating and hematuria.  Musculoskeletal: Negative for arthralgias, myalgias and neck pain.  Skin: Negative for rash.  Neurological: Negative for dizziness, seizures, syncope and headaches.  Hematological: Negative for adenopathy. Does not bruise/bleed easily.  Psychiatric/Behavioral: Positive for dysphoric mood (controlled with zoloft). The patient is not nervous/anxious.    Per HPI unless specifically indicated in ROS section     Objective:    BP 120/68 (BP Location: Left Arm, Patient Position: Sitting, Cuff Size: Normal)   Pulse 68   Temp 97.5 F (36.4 C) (Oral)   Wt 147 lb (66.7 kg)   SpO2 98%   BMI 26.25 kg/m   Wt Readings from Last 3 Encounters:  02/07/16 147 lb (66.7 kg)  02/04/16 146 lb (66.2 kg)  11/12/15 147 lb (66.7 kg)    Physical Exam  Constitutional: She is oriented to person, place, and time. She appears well-developed and well-nourished. No distress.  HENT:  Head: Normocephalic and atraumatic.  Right Ear: Hearing, tympanic membrane, external ear and ear canal normal.  Left Ear: Hearing, tympanic membrane, external ear and ear canal normal.  Nose: Nose normal.  Mouth/Throat: Uvula is midline, oropharynx is clear and moist and mucous membranes are normal. No oropharyngeal exudate, posterior oropharyngeal edema or posterior oropharyngeal erythema.  Eyes: Conjunctivae and EOM are normal. Pupils  are equal, round, and reactive to light. No scleral icterus.  Neck: Normal range of motion. Neck supple. Carotid bruit is present (R mild). No thyromegaly present.  Cardiovascular: Normal rate, regular rhythm, normal heart sounds and intact distal pulses.   No murmur heard. Pulses:      Radial pulses are 2+ on the right side, and 2+ on the left side.  Pulmonary/Chest: Effort normal and breath sounds normal. No respiratory distress. She has no wheezes. She has no rales.  Abdominal: Soft. Bowel sounds are normal. She exhibits no distension and no mass. There is no tenderness. There is no rebound and no guarding.  Musculoskeletal: Normal range of motion. She exhibits no edema.  L knee WNL R Knee exam: No deformity on inspection. No pain with palpation of knee landmarks. No effusion/swelling noted. FROM in flex/extension without crepitus. No popliteal fullness. Neg drawer test. Neg mcmurray test. No pain with valgus/varus stress.  Lymphadenopathy:    She has no cervical adenopathy.  Neurological: She is alert and oriented to person, place, and time.  CN grossly intact, station and gait intact  Skin: Skin is warm and dry. No rash noted.  Psychiatric: She has a normal mood and affect. Her behavior is normal. Judgment and thought  content normal.  Nursing note and vitals reviewed.  Results for orders placed or performed in visit on 02/04/16  Lipid panel  Result Value Ref Range   Cholesterol 203 (H) 0 - 200 mg/dL   Triglycerides 263.0 (H) 0.0 - 149.0 mg/dL   HDL 36.50 (L) >39.00 mg/dL   VLDL 52.6 (H) 0.0 - 40.0 mg/dL   Total CHOL/HDL Ratio 6    NonHDL 166.75   Comprehensive metabolic panel  Result Value Ref Range   Sodium 138 135 - 145 mEq/L   Potassium 4.4 3.5 - 5.1 mEq/L   Chloride 105 96 - 112 mEq/L   CO2 27 19 - 32 mEq/L   Glucose, Bld 129 (H) 70 - 99 mg/dL   BUN 21 6 - 23 mg/dL   Creatinine, Ser 1.37 (H) 0.40 - 1.20 mg/dL   Total Bilirubin 0.5 0.2 - 1.2 mg/dL   Alkaline  Phosphatase 60 39 - 117 U/L   AST 15 0 - 37 U/L   ALT 9 0 - 35 U/L   Total Protein 7.0 6.0 - 8.3 g/dL   Albumin 4.2 3.5 - 5.2 g/dL   Calcium 9.9 8.4 - 10.5 mg/dL   GFR 40.04 (L) >60.00 mL/min  TSH  Result Value Ref Range   TSH 2.12 0.35 - 4.50 uIU/mL  Hemoglobin A1c  Result Value Ref Range   Hgb A1c MFr Bld 6.7 (H) 4.6 - 6.5 %  CBC with Differential/Platelet  Result Value Ref Range   WBC 7.6 4.0 - 10.5 K/uL   RBC 4.46 3.87 - 5.11 Mil/uL   Hemoglobin 12.5 12.0 - 15.0 g/dL   HCT 37.4 36.0 - 46.0 %   MCV 83.9 78.0 - 100.0 fl   MCHC 33.3 30.0 - 36.0 g/dL   RDW 14.0 11.5 - 15.5 %   Platelets 273.0 150.0 - 400.0 K/uL   Neutrophils Relative % 59.7 43.0 - 77.0 %   Lymphocytes Relative 28.0 12.0 - 46.0 %   Monocytes Relative 6.5 3.0 - 12.0 %   Eosinophils Relative 5.3 (H) 0.0 - 5.0 %   Basophils Relative 0.5 0.0 - 3.0 %   Neutro Abs 4.5 1.4 - 7.7 K/uL   Lymphs Abs 2.1 0.7 - 4.0 K/uL   Monocytes Absolute 0.5 0.1 - 1.0 K/uL   Eosinophils Absolute 0.4 0.0 - 0.7 K/uL   Basophils Absolute 0.0 0.0 - 0.1 K/uL  Microalbumin / creatinine urine ratio  Result Value Ref Range   Microalb, Ur <0.7 0.0 - 1.9 mg/dL   Creatinine,U 55.7 mg/dL   Microalb Creat Ratio 1.3 0.0 - 30.0 mg/g  VITAMIN D 25 Hydroxy (Vit-D Deficiency, Fractures)  Result Value Ref Range   VITD 37.07 30.00 - 100.00 ng/mL  LDL cholesterol, direct  Result Value Ref Range   Direct LDL 124.0 mg/dL      Assessment & Plan:   Problem List Items Addressed This Visit    Advanced care planning/counseling discussion    Advanced directive discussion - in process of setting this up with attorney. Will bring [name] copy. Very difficult to complete since husband's passing. Thinking of getting [place] involved.       Atherosclerosis of native coronary artery of native heart without angina pectoris    Continue aspirin, statin. Followed by cards Gillian Shields)      Baker's cyst of knee    H/o complex baker's cyst with  meniscal tear that improved over spring after ortho steroid injection. Endorses persistent discomfort at site but exam WNL  today. Encouraged continued knee brace use, seems to be improving. Return if recurrent or worsening.       Carotid stenosis    R bruit. Update [name]. Prior <50% stenosis (2015)      Relevant Orders   VAS US CAROTID   CKD (chronic kidney disease) stage 3, GFR 30-59 ml/min    Chronic, reviewed with patient.      Dermatomyositis (HCC)    Stable, in remission.       Diabetes mellitus type 2, controlled, with complications ([place])    123456 has trended into diabetes range. Reviewed with patient, discussed importance of better glycemic control and low sugar /carb diet. RTC 3 mo f/u DM visit.      Health maintenance examination - Primary    Preventative protocols reviewed and updated unless pt declined. Discussed healthy diet and lifestyle.       Hypothyroidism    Chronic, stable. Continue current regimen.      MDD (major depressive disorder), recurrent episode, moderate (HCC)    Chronic, ongoing. Stable on current regimen - continue. Patient has no immediate family. Counseling prior worsened depression.      Microscopic colitis    Continues sertraline, controlled with daily imodium.      Osteoporosis    Reviewed with patient. Continue calcium, vit D, weight bearing exercises. Pt does not want oral bisphosphonates. prolia handout provided today. Consider rpt DEXA 01/2017      Pure hypercholesterolemia    Statin intolerance, but tolerating pravastatin 80mg  daily. Reviewed cholesterol levels, discussed adding zetia. Pt will work towards better sugar control and healthy diet changes and reassess at 82mo f/u visit. Goal LDL <70 given CAD hx.      Vertebral artery stenosis/occlusion    H/o high grade stenosis at origin of L vertebral artery.        Other Visit Diagnoses   None.      Follow up plan: Return in about 3 months (around 05/09/2016) for follow up  visit.  [name], MD

## 2016-02-07 NOTE — Patient Instructions (Addendum)
Look into prolia for osteoporosis.  Look into mediterranean diet Sugar and cholesterol levels were too high - decrease sweetened beverages and added sugars. Your A1c was in diabetes range.  Return in 3 months for follow up visit on diabetes.       Mediterranean Diet  Why follow it? Research shows. . Those who follow the Mediterranean diet have a reduced risk of heart disease  . The diet is associated with a reduced incidence of Parkinson's and Alzheimer's diseases . People following the diet may have longer life expectancies and lower rates of chronic diseases  . The Dietary Guidelines for Americans recommends the Mediterranean diet as an eating plan to promote health and prevent disease  What Is the Mediterranean Diet?  . Healthy eating plan based on typical foods and recipes of Mediterranean-style cooking . The diet is primarily a plant based diet; these foods should make up a majority of meals   Starches - Plant based foods should make up a majority of meals - They are an important sources of vitamins, minerals, energy, antioxidants, and fiber - Choose whole grains, foods high in fiber and minimally processed items  - Typical grain sources include wheat, oats, barley, corn, brown rice, bulgar, farro, millet, polenta, couscous  - Various types of beans include chickpeas, lentils, fava beans, black beans, white beans   Fruits  Veggies - Large quantities of antioxidant rich fruits & veggies; 6 or more servings  - Vegetables can be eaten raw or lightly drizzled with oil and cooked  - Vegetables common to the traditional Mediterranean Diet include: artichokes, arugula, beets, broccoli, brussel sprouts, cabbage, carrots, celery, collard greens, cucumbers, eggplant, kale, leeks, lemons, lettuce, mushrooms, okra, onions, peas, peppers, potatoes, pumpkin, radishes, rutabaga, shallots, spinach, sweet potatoes, turnips, zucchini - Fruits common to the Mediterranean Diet include: apples, apricots,  avocados, cherries, clementines, dates, figs, grapefruits, grapes, melons, nectarines, oranges, peaches, pears, pomegranates, strawberries, tangerines  Fats - Replace butter and margarine with healthy oils, such as olive oil, canola oil, and tahini  - Limit nuts to no more than a handful a day  - Nuts include walnuts, almonds, pecans, pistachios, pine nuts  - Limit or avoid candied, honey roasted or heavily salted nuts - Olives are central to the [employer] - can be eaten whole or used in a variety of dishes   Meats Protein - Limiting red meat: no more than a few times a month - When eating red meat: choose lean cuts and keep the portion to the size of deck of cards - Eggs: approx. 0 to 4 times a week  - Fish and lean poultry: at least 2 a week  - Healthy protein sources include, chicken, [place], lean beef, lamb - Increase intake of seafood such as tuna, salmon, trout, mackerel, shrimp, scallops - Avoid or limit high fat processed meats such as sausage and bacon  Dairy - Include moderate amounts of low fat dairy products  - Focus on healthy dairy such as fat free yogurt, skim milk, low or reduced fat cheese - Limit dairy products higher in fat such as whole or 2% milk, cheese, ice cream  Alcohol - Moderate amounts of red wine is ok  - No more than 5 oz daily for women (all ages) and men older than age 57  - No more than 10 oz of wine daily for men younger than 7  Other - Limit sweets and other desserts  - Use herbs and spices instead of salt to  flavor foods  - Herbs and spices common to the traditional Mediterranean Diet include: basil, bay leaves, chives, cloves, cumin, fennel, garlic, lavender, marjoram, mint, oregano, parsley, pepper, rosemary, sage, savory, sumac, tarragon, thyme   It's not just a diet, it's a lifestyle:  . The Mediterranean diet includes lifestyle factors typical of those in the region  . Foods, drinks and meals are best eaten with others and savored . Daily  physical activity is important for overall good health . This could be strenuous exercise like running and aerobics . This could also be more leisurely activities such as walking, housework, yard-work, or taking the stairs . Moderation is the key; a balanced and healthy diet accommodates most foods and drinks . Consider portion sizes and frequency of consumption of certain foods   Meal Ideas & Options:  . Breakfast:  o Whole wheat toast or whole wheat English muffins with peanut butter & hard boiled egg o Steel cut oats topped with apples & cinnamon and skim milk  o Fresh fruit: banana, strawberries, melon, berries, peaches  o Smoothies: strawberries, bananas, greek yogurt, peanut butter o Low fat greek yogurt with blueberries and granola  o Egg white omelet with spinach and mushrooms o Breakfast couscous: whole wheat couscous, apricots, skim milk, cranberries  . Sandwiches:  o Hummus and grilled vegetables (peppers, zucchini, squash) on whole wheat bread   o Grilled chicken on whole wheat pita with lettuce, tomatoes, cucumbers or tzatziki  o Tuna salad on whole wheat bread: tuna salad made with greek yogurt, olives, red peppers, capers, green onions o Garlic rosemary lamb pita: lamb sauted with garlic, rosemary, salt & pepper; add lettuce, cucumber, greek yogurt to pita - flavor with lemon juice and black pepper  . Seafood:  o Mediterranean grilled salmon, seasoned with garlic, basil, parsley, lemon juice and black pepper o Shrimp, lemon, and spinach whole-grain pasta salad made with low fat greek yogurt  o Seared scallops with lemon orzo  o Seared tuna steaks seasoned salt, pepper, coriander topped with tomato mixture of olives, tomatoes, olive oil, minced garlic, parsley, green onions and cappers  . Meats:  o Herbed greek chicken salad with kalamata olives, cucumber, feta  o Red bell peppers stuffed with spinach, bulgur, lean ground beef (or lentils) & topped with feta   o Kebabs:  skewers of chicken, tomatoes, onions, zucchini, squash  o [place] burgers: made with red onions, mint, dill, lemon juice, feta cheese topped with roasted red peppers . Vegetarian o Cucumber salad: cucumbers, artichoke hearts, celery, red onion, feta cheese, tossed in olive oil & lemon juice  o Hummus and whole grain pita points with a greek salad (lettuce, tomato, feta, olives, cucumbers, red onion) o Lentil soup with celery, carrots made with vegetable broth, garlic, salt and pepper  o Tabouli salad: parsley, bulgur, mint, scallions, cucumbers, tomato, radishes, lemon juice, olive oil, salt and pepper.    Denosumab injection What is this medicine? DENOSUMAB (den [place] sue mab) slows bone breakdown. Prolia is used to treat osteoporosis in women after menopause and in men. Delton See is used to prevent bone fractures and other bone problems caused by cancer bone metastases. Delton See is also used to treat giant cell tumor of the bone. This medicine may be used for other purposes; ask your health care provider or pharmacist if you have questions. What should I tell my health care provider before I take this medicine? They need to know if you have any of these conditions: -dental disease -eczema -  infection or history of infections -kidney disease or on dialysis -low blood calcium or vitamin D -malabsorption syndrome -scheduled to have surgery or tooth extraction -taking medicine that contains denosumab -thyroid or parathyroid disease -an unusual reaction to denosumab, other medicines, foods, dyes, or preservatives -pregnant or trying to get pregnant -breast-feeding How should I use this medicine? This medicine is for injection under the skin. It is given by a health care professional in a hospital or clinic setting. If you are getting Prolia, a special MedGuide will be given to you by the pharmacist with each prescription and refill. Be sure to read this information carefully each time. For Prolia,  talk to your pediatrician regarding the use of this medicine in children. Special care may be needed. For Delton See, talk to your pediatrician regarding the use of this medicine in children. While this drug may be prescribed for children as young as 13 years for selected conditions, precautions do apply. Overdosage: If you think you have taken too much of this medicine contact a poison control center or emergency room at once. NOTE: This medicine is only for you. Do not share this medicine with others. What if I miss a dose? It is important not to miss your dose. Call your doctor or health care professional if you are unable to keep an appointment. What may interact with this medicine? Do not take this medicine with any of the following medications: -other medicines containing denosumab This medicine may also interact with the following medications: -medicines that suppress the immune system -medicines that treat cancer -steroid medicines like prednisone or cortisone This list may not describe all possible interactions. Give your health care provider a list of all the medicines, herbs, non-prescription drugs, or dietary supplements you use. Also tell them if you smoke, drink alcohol, or use illegal drugs. Some items may interact with your medicine. What should I watch for while using this medicine? Visit your doctor or health care professional for regular checks on your progress. Your doctor or health care professional may order blood tests and other tests to see how you are doing. Call your doctor or health care professional if you get a cold or other infection while receiving this medicine. Do not treat yourself. This medicine may decrease your body's ability to fight infection. You should make sure you get enough calcium and vitamin D while you are taking this medicine, unless your doctor tells you not to. Discuss the foods you eat and the vitamins you take with your health care professional. See  your dentist regularly. Brush and floss your teeth as directed. Before you have any dental work done, tell your dentist you are receiving this medicine. Do not become pregnant while taking this medicine or for 5 months after stopping it. Women should inform their doctor if they wish to become pregnant or think they might be pregnant. There is a potential for serious side effects to an unborn child. Talk to your health care professional or pharmacist for more information. What side effects may I notice from receiving this medicine? Side effects that you should report to your doctor or health care professional as soon as possible: -allergic reactions like skin rash, itching or hives, swelling of the face, lips, or tongue -breathing problems -chest pain -fast, irregular heartbeat -feeling faint or lightheaded, falls -fever, chills, or any other sign of infection -muscle spasms, tightening, or twitches -numbness or tingling -skin blisters or bumps, or is dry, peels, or red -slow healing or unexplained pain  in the mouth or jaw -unusual bleeding or bruising Side effects that usually do not require medical attention (Report these to your doctor or health care professional if they continue or are bothersome.): -muscle pain -stomach upset, gas This list may not describe all possible side effects. Call your doctor for medical advice about side effects. You may report side effects to FDA at 1-800-FDA-1088. Where should I keep my medicine? This medicine is only given in a clinic, doctor's office, or other health care setting and will not be stored at home. NOTE: This sheet is a summary. It may not cover all possible information. If you have questions about this medicine, talk to your doctor, pharmacist, or health care provider.    2016, Elsevier/Gold Standard. (2011-09-29 12:37:47)

## 2016-02-07 NOTE — Assessment & Plan Note (Addendum)
Statin intolerance, but tolerating pravastatin 80mg  daily. Reviewed cholesterol levels, discussed adding zetia. Pt will work towards better sugar control and healthy diet changes and reassess at 69mo f/u visit. Goal LDL <70 given CAD hx.

## 2016-02-07 NOTE — Assessment & Plan Note (Addendum)
Chronic, ongoing. Stable on current regimen - continue. Patient has no immediate family. Counseling prior worsened depression.

## 2016-02-07 NOTE — Assessment & Plan Note (Signed)
A1c has trended into diabetes range. Reviewed with patient, discussed importance of better glycemic control and low sugar /carb diet. RTC 3 mo f/u DM visit.

## 2016-02-07 NOTE — Assessment & Plan Note (Addendum)
H/o complex baker's cyst with meniscal tear that improved over spring after ortho steroid injection. Endorses persistent discomfort at site but exam WNL today. Encouraged continued knee brace use, seems to be improving. Return if recurrent or worsening.

## 2016-02-07 NOTE — Assessment & Plan Note (Addendum)
Reviewed with patient. Continue calcium, vit D, weight bearing exercises. Pt does not want oral bisphosphonates. prolia handout provided today. Consider rpt DEXA 01/2017

## 2016-02-22 ENCOUNTER — Other Ambulatory Visit: Payer: Self-pay | Admitting: *Deleted

## 2016-02-22 MED ORDER — CLONAZEPAM 0.5 MG PO TABS: ORAL_TABLET | ORAL | 3 refills | Status: DC

## 2016-02-22 NOTE — Telephone Encounter (Signed)
Ok to refill? Last filled 10/25/15 3RF

## 2016-02-22 NOTE — Telephone Encounter (Signed)
plz phone in. 

## 2016-02-25 NOTE — Telephone Encounter (Signed)
Called into [place], [place] [facility] [place] [phone] (Phone) [phone] (Fax)

## 2016-03-04 DIAGNOSIS — E119 Type 2 diabetes mellitus without complications: Secondary | ICD-10-CM | POA: Diagnosis not present

## 2016-03-04 LAB — HM DIABETES EYE EXAM

## 2016-03-10 ENCOUNTER — Ambulatory Visit: Payer: PPO

## 2016-03-10 DIAGNOSIS — I6523 Occlusion and stenosis of bilateral carotid arteries: Secondary | ICD-10-CM

## 2016-03-10 DIAGNOSIS — R0989 Other specified symptoms and signs involving the circulatory and respiratory systems: Secondary | ICD-10-CM | POA: Diagnosis not present

## 2016-03-12 ENCOUNTER — Encounter: Payer: Self-pay | Admitting: *Deleted

## 2016-03-17 DIAGNOSIS — Z803 Family history of malignant neoplasm of breast: Secondary | ICD-10-CM | POA: Diagnosis not present

## 2016-03-17 DIAGNOSIS — Z1231 Encounter for screening mammogram for malignant neoplasm of breast: Secondary | ICD-10-CM | POA: Diagnosis not present

## 2016-03-17 LAB — HM MAMMOGRAPHY

## 2016-03-19 ENCOUNTER — Encounter: Payer: Self-pay | Admitting: Family Medicine

## 2016-03-24 ENCOUNTER — Encounter: Payer: Self-pay | Admitting: *Deleted

## 2016-05-09 ENCOUNTER — Encounter: Payer: Self-pay | Admitting: Family Medicine

## 2016-05-09 ENCOUNTER — Ambulatory Visit (INDEPENDENT_AMBULATORY_CARE_PROVIDER_SITE_OTHER): Payer: PPO | Admitting: Family Medicine

## 2016-05-09 ENCOUNTER — Telehealth: Payer: Self-pay | Admitting: Cardiology

## 2016-05-09 VITALS — BP 118/62 | HR 76 | Temp 98.0°F | Wt 147.8 lb

## 2016-05-09 DIAGNOSIS — F331 Major depressive disorder, recurrent, moderate: Secondary | ICD-10-CM

## 2016-05-09 DIAGNOSIS — N183 Chronic kidney disease, stage 3 unspecified: Secondary | ICD-10-CM

## 2016-05-09 DIAGNOSIS — R8290 Unspecified abnormal findings in urine: Secondary | ICD-10-CM

## 2016-05-09 DIAGNOSIS — E118 Type 2 diabetes mellitus with unspecified complications: Secondary | ICD-10-CM | POA: Diagnosis not present

## 2016-05-09 LAB — RENAL FUNCTION PANEL
Albumin: 4.2 g/dL (ref 3.5–5.2)
BUN: 18 mg/dL (ref 6–23)
CO2: 28 mEq/L (ref 19–32)
CREATININE: 1.3 mg/dL — AB (ref 0.40–1.20)
Calcium: 9.7 mg/dL (ref 8.4–10.5)
Chloride: 106 mEq/L (ref 96–112)
GFR: 42.51 mL/min — AB (ref >60.00)
GLUCOSE: 121 mg/dL — AB (ref 70–99)
POTASSIUM: 5 meq/L (ref 3.5–5.1)
Phosphorus: 3.3 mg/dL (ref 2.3–4.6)
SODIUM: 139 meq/L (ref 135–145)

## 2016-05-09 LAB — POC URINALSYSI DIPSTICK (AUTOMATED)
Bilirubin, UA: NEGATIVE
Glucose, UA: NEGATIVE
KETONES UA: NEGATIVE
NITRITE UA: NEGATIVE
PH UA: 6
Spec Grav, UA: 1.02
Urobilinogen, UA: 0.2

## 2016-05-09 LAB — HEMOGLOBIN A1C: HEMOGLOBIN A1C: 6.5 % (ref 4.6–6.5)

## 2016-05-09 MED ORDER — CEPHALEXIN 500 MG PO CAPS: 500.0000 mg | ORAL_CAPSULE | Freq: Two times a day (BID) | ORAL | 0 refills | Status: DC

## 2016-05-09 NOTE — Addendum Note (Signed)
Addended by: [name] on: 05/09/2016 09:49 AM   Modules accepted: Orders, Level of Service

## 2016-05-09 NOTE — Telephone Encounter (Signed)
Spoke with patient and per last ov patient is to follow up in 6 months Scheduled follow up for patient

## 2016-05-09 NOTE — Addendum Note (Signed)
Addended by: Royann Shivers A on: 05/09/2016 09:54 AM   Modules accepted: Orders

## 2016-05-09 NOTE — Assessment & Plan Note (Signed)
Relatively new diagnosis. Reviewed with patient importance of low sugar/carb diet.  Recheck A1c today.

## 2016-05-09 NOTE — Progress Notes (Addendum)
BP 118/62   Pulse 76   Temp 98 F (36.7 C) (Oral)   Wt 147 lb 12 oz (67 kg)   BMI 26.38 kg/m    CC: 62mo f/u visit Subjective:    Patient ID: Kathleen Williamson, female    DOB: 08-Apr-1941, 76 y.o.   MRN: [id number]  HPI: Kathleen Williamson is a 76 y.o. female presenting on 05/09/2016 for Follow-up   She has started to work at [employer].  Difficult holiday season after husband's death 07/13/2013.   2 wks ago had UTI sxs of pressure - passed by to make appt, was told we didn't have any and was not offered next available appt. self-treated with azo and decreased soft drinks, increase water and cranberry juice. Much improved symptoms now.   DM - regularly does not check sugars. Compliant with antihyperglycemic regimen which includes: diet controlled. Denies hypoglycemic symptoms. Denies paresthesias. Last diabetic eye exam 02/2016.  Pneumovax: July 14, 2006.  Prevnar: Jul 14, 2015. Lab Results  Component Value Date   HGBA1C 6.7 (H) 02/04/2016   Diabetic Foot Exam - Simple   Simple Foot Form Diabetic Foot exam was performed with the following findings:  Yes 05/09/2016  9:31 AM  Visual Inspection No deformities, no ulcerations, no other skin breakdown bilaterally:  Yes Sensation Testing Intact to touch and monofilament testing bilaterally:  Yes Pulse Check Posterior Tibialis and Dorsalis pulse intact bilaterally:  Yes Comments      Relevant past medical, surgical, family and social history reviewed and updated as indicated. Interim medical history since our last visit reviewed. Allergies and medications reviewed and updated. Current Outpatient Prescriptions on File Prior to Visit  Medication Sig  . Ascorbic Acid (VITAMIN C) 1000 MG tablet Take 1,000 mg by mouth daily.  Marland Kitchen aspirin EC 81 MG tablet Take 81 mg by mouth at bedtime.  . Biotin 5 MG CAPS Take 1 capsule by mouth daily.  . Calcium Carbonate-Vitamin D 600-400 MG-UNIT chew tablet Chew 1 tablet by mouth daily. (Patient taking  differently: Chew 1 tablet by mouth 2 (two) times daily. )  . clonazePAM (KLONOPIN) 0.5 MG tablet TAKE 1 TABLET BY MOUTH TWICE A DAY as needed  . Coenzyme Q10 (COQ10) 100 MG CAPS Take 1 capsule by mouth at bedtime.   Marland Kitchen levothyroxine (SYNTHROID, LEVOTHROID) 50 MCG tablet TAKE 1 TABLET EVERY DAY  . loperamide (IMODIUM A-D) 2 MG tablet Take 2 mg by mouth every morning.  . mirtazapine (REMERON) 15 MG tablet TAKE 1 TABLET BEFORE BEDTIME AT BEDTIME ORALLY 30 DAY(S)  . nitroGLYCERIN (NITROLINGUAL) 0.4 MG/SPRAY spray Place 1 spray under the tongue every 5 (five) minutes as needed.  . pravastatin (PRAVACHOL) 80 MG tablet Take 1 tablet (80 mg total) by mouth every evening.  . sertraline (ZOLOFT) 100 MG tablet TAKE 1 TABLET BY MOUTH EVERY DAY  . Turmeric 500 MG CAPS Take 1 capsule by mouth daily.  . vitamin B-12 (CYANOCOBALAMIN) 1000 MCG tablet Take 1,000 mcg by mouth daily.   No current facility-administered medications on file prior to visit.     Review of Systems Per HPI unless specifically indicated in ROS section     Objective:    BP 118/62   Pulse 76   Temp 98 F (36.7 C) (Oral)   Wt 147 lb 12 oz (67 kg)   BMI 26.38 kg/m   Wt Readings from Last 3 Encounters:  05/09/16 147 lb 12 oz (67 kg)  02/07/16 147 lb (66.7 kg)  02/04/16 146 lb (66.2 kg)    Physical Exam  Constitutional: She appears well-developed and well-nourished. No distress.  HENT:  Head: Normocephalic and atraumatic.  Right Ear: External ear normal.  Left Ear: External ear normal.  Nose: Nose normal.  Mouth/Throat: Oropharynx is clear and moist. No oropharyngeal exudate.  Eyes: Conjunctivae and EOM are normal. Pupils are equal, round, and reactive to light. No scleral icterus.  Neck: Normal range of motion. Neck supple.  Cardiovascular: Normal rate, regular rhythm, normal heart sounds and intact distal pulses.   No murmur heard. Pulmonary/Chest: Effort normal and breath sounds normal. No respiratory distress. She has  no wheezes. She has no rales.  Abdominal: Soft. Normal appearance and bowel sounds are normal. She exhibits no distension and no mass. There is no hepatosplenomegaly. There is no tenderness. There is no rigidity, no rebound, no guarding, no CVA tenderness and negative Murphy's sign.  Musculoskeletal: She exhibits no edema.  See HPI for foot exam if done  Lymphadenopathy:    She has no cervical adenopathy.  Skin: Skin is warm and dry. No rash noted.  Psychiatric: She has a normal mood and affect.  Nursing note and vitals reviewed.  Results for orders placed or performed in visit on 03/24/16  HM MAMMOGRAPHY  Result Value Ref Range   HM Mammogram 0-4 Bi-Rad 0-4 Bi-Rad, Self Reported Normal      Assessment & Plan:   Problem List Items Addressed This Visit    CKD (chronic kidney disease) stage 3, GFR 30-59 ml/min   Relevant Orders   Renal function panel   Cloudy urine    Over 2 weeks, never treated with antibiotic. Check UA today.  UA - suspicious for infection - will treat with keflex 7d course. UCx sent.       Relevant Orders   Urine culture   Diabetes mellitus type 2, controlled, with complications ([place]) - Primary    Relatively new diagnosis. Reviewed with patient importance of low sugar/carb diet.  Recheck A1c today.       Relevant Orders   Hemoglobin A1c   MDD (major depressive disorder), recurrent episode, moderate (HCC)    Stable period, tough holidays. Support provided. Patient has no immediate family.           Follow up plan: Return in about 4 months (around 09/06/2016) for follow up visit.  [name], MD

## 2016-05-09 NOTE — Assessment & Plan Note (Addendum)
Over 2 weeks, never treated with antibiotic. Check UA today.  UA - suspicious for infection - will treat with keflex 7d course. UCx sent.

## 2016-05-09 NOTE — Assessment & Plan Note (Addendum)
Stable period, tough holidays. Support provided. Patient has no immediate family.

## 2016-05-09 NOTE — Telephone Encounter (Signed)
New message   Pt verbalized that she is calling because she was told that the appts   was going to be yearly with Dr.Skains and she Is calling to confirm

## 2016-05-09 NOTE — Patient Instructions (Addendum)
Labs today.  Urinalysis today.  Watch added sugars in the diet as well as simple carbs.  You are doing well today.

## 2016-05-09 NOTE — Addendum Note (Signed)
Addended by: [name] on: 05/09/2016 09:41 AM   Modules accepted: Orders

## 2016-05-11 LAB — URINE CULTURE

## 2016-05-21 ENCOUNTER — Other Ambulatory Visit: Payer: Self-pay | Admitting: Cardiology

## 2016-05-22 ENCOUNTER — Other Ambulatory Visit: Payer: Self-pay | Admitting: Cardiology

## 2016-05-23 ENCOUNTER — Encounter: Payer: Self-pay | Admitting: Cardiology

## 2016-05-23 ENCOUNTER — Ambulatory Visit (INDEPENDENT_AMBULATORY_CARE_PROVIDER_SITE_OTHER): Payer: PPO | Admitting: Cardiology

## 2016-05-23 VITALS — BP 118/62 | HR 66 | Ht 64.0 in | Wt 146.0 lb

## 2016-05-23 DIAGNOSIS — I2510 Atherosclerotic heart disease of native coronary artery without angina pectoris: Secondary | ICD-10-CM

## 2016-05-23 DIAGNOSIS — E7800 Pure hypercholesterolemia, unspecified: Secondary | ICD-10-CM | POA: Diagnosis not present

## 2016-05-23 DIAGNOSIS — Z79899 Other long term (current) drug therapy: Secondary | ICD-10-CM | POA: Diagnosis not present

## 2016-05-23 DIAGNOSIS — Z789 Other specified health status: Secondary | ICD-10-CM

## 2016-05-23 MED ORDER — EZETIMIBE 10 MG PO TABS: 10.0000 mg | ORAL_TABLET | Freq: Every day | ORAL | 3 refills | Status: DC

## 2016-05-23 MED ORDER — PRAVASTATIN SODIUM 80 MG PO TABS: 80.0000 mg | ORAL_TABLET | Freq: Every evening | ORAL | 3 refills | Status: DC

## 2016-05-23 MED ORDER — NITROGLYCERIN 0.4 MG/SPRAY TL SOLN: 1.0000 | 99 refills | Status: DC | PRN

## 2016-05-23 NOTE — Progress Notes (Signed)
[facility]. [address] [place], [place]  [address] Phone: [phone] Fax:  [phone]  Date:  05/23/2016   ID:  Kathleen Williamson, DOB [birth year], MRN [id number]  PCP:  [name], MD   History of Present Illness: Kathleen Williamson is a 76 y.o. female with coronary artery disease status post bypass in 2003 (CABG-LIMA-LAD, free right IMA to PDA,SVG-diagonal, SVG-OM 2-OM 5 03/2002), carotid artery disease, dermatomyositis, depression, hyperlipidemia, hypothyroidism here for followup. Former patient of Dr. Leonia Reeves. Nuclear stress test in 2012 showed no ischemia, 7 minutes exercise time.   She denies any chest discomfort, shortness of breath.  She repeated to me her story of her symptoms prior to bypass. She was singing in the church choir, felt an elephant sitting on her chest, felt poorly, pulse was erratic. She did not to the hospital at that time. The next day she saw an internist who sent her to Dr. Leonia Reeves.   Her husband died in 13-Oct-2013 had grieving. Lives at [facility]. Enjoys it there.   In the past, she has tried Lipitor, Zocor, Crestor but these cause body aches as she had increased dosing. She is tolerating pravastatin 80 mg. her LDL however is still above 100. I am going to try Zetia.  Has a Baker's cyst on left knee. This has limited her walking. She done Weight Watchers. Overall she is doing quite well.   Wt Readings from Last 3 Encounters:  05/23/16 146 lb (66.2 kg)  05/09/16 147 lb 12 oz (67 kg)  02/07/16 147 lb (66.7 kg)     Past Medical History:  Diagnosis Date  . Arthritis    fingers  . CAD (coronary artery disease) 2003   MI s/p 5v CABG  . Carotid stenosis    Skains  . CKD (chronic kidney disease) stage 3, GFR 30-59 ml/min   . Dermatomyositis [facility])    saw Dr Estanislado Pandy, improved on its own  . History of chicken pox   . History of measles   . History of migraine none since 2003  . HLD (hyperlipidemia)   . Hypothyroidism   . MDD (major  depressive disorder), recurrent episode, moderate ([place])    h/o SI after lost husband unexpectedly 2015  . Microscopic colitis 05/2014   by colonoscopy, lymphocytic, zoloft related  . Osteoporosis    DEXA 03/2013 T -3.1, DEXA 10//2016 -2.9 hip, -2.2 spine  . Pancreatic cyst    Dr Ralene Ok, no f/u needed  . Prediabetes 2014  . Subclavian steal syndrome   . Urine incontinence     Past Surgical History:  Procedure Laterality Date  . APPENDECTOMY  1997   with hysterectomy  . [place]   lower  . BREAST LUMPECTOMY Left 1977   benign  . COLONOSCOPY WITH PROPOFOL N/A 05/23/2014   microscopic colitis - Garlan Fair, MD  . CORONARY ARTERY BYPASS GRAFT  2003   x 5 v  . ESOPHAGOGASTRODUODENOSCOPY (EGD) WITH PROPOFOL N/A 05/23/2014   WNL Garlan Fair, MD  . [place] Right 1990's  . TOOTH EXTRACTION  yrs ago  . TOTAL ABDOMINAL HYSTERECTOMY W/ BILATERAL SALPINGOOPHORECTOMY  1997   endometriosis    Current Outpatient Prescriptions  Medication Sig Dispense Refill  . Ascorbic Acid (VITAMIN C) 1000 MG tablet Take 1,000 mg by mouth daily.    Marland Kitchen aspirin EC 81 MG tablet Take 81 mg by mouth at bedtime.    . Biotin  5 MG CAPS Take 1 capsule by mouth daily.    . Calcium Carbonate-Vitamin D 600-400 MG-UNIT tablet Take 1 tablet by mouth 2 (two) times daily.    . clonazePAM (KLONOPIN) 0.5 MG tablet TAKE 1 TABLET BY MOUTH TWICE A DAY as needed 60 tablet 3  . Coenzyme Q10 (COQ10) 100 MG CAPS Take 1 capsule by mouth at bedtime.     Marland Kitchen levothyroxine (SYNTHROID, LEVOTHROID) 50 MCG tablet TAKE 1 TABLET EVERY DAY 90 tablet 3  . loperamide (IMODIUM A-D) 2 MG tablet Take 2 mg by mouth every morning.    . mirtazapine (REMERON) 15 MG tablet TAKE 1 TABLET BEFORE BEDTIME AT BEDTIME ORALLY 30 DAY(S) 30 tablet 9  . nitroGLYCERIN (NITROLINGUAL) 0.4 MG/SPRAY spray Place 1 spray under the tongue every 5 (five) minutes as needed. 12 g prn  . pravastatin (PRAVACHOL) 80 MG tablet Take 1 tablet (80 mg  total) by mouth every evening. 90 tablet 3  . sertraline (ZOLOFT) 100 MG tablet TAKE 1 TABLET BY MOUTH EVERY DAY 90 tablet 3  . Turmeric 500 MG CAPS Take 1 capsule by mouth daily.    . vitamin B-12 (CYANOCOBALAMIN) 1000 MCG tablet Take 1,000 mcg by mouth daily.    Marland Kitchen ezetimibe (ZETIA) 10 MG tablet Take 1 tablet (10 mg total) by mouth daily. 90 tablet 3   No current facility-administered medications for this visit.     Allergies:    Allergies  Allergen Reactions  . Sulfa Antibiotics Nausea Only  . Crestor [Rosuvastatin Calcium] Other (See Comments)    Leg ache    Social History:  The patient  reports that she has never smoked. She has never used smokeless tobacco. She reports that she does not drink alcohol or use drugs.   Family History  Problem Relation Age of Onset  . CAD Father 106  . CAD Mother 43  . Diabetes Mother   . Rheum arthritis Mother   . Cancer Maternal Aunt     breast  . CAD Other     strong paternal side  . Stroke Neg Hx     ROS:  Please see the history of present illness.   Denies any fevers, chills, orthopnea, PND.  All other systems reviewed and negative.   PHYSICAL EXAM: VS:  BP 118/62   Pulse 66   Ht 5\' 4"  (1.626 m)   Wt 146 lb (66.2 kg)   LMP  (LMP Unknown)   BMI 25.06 kg/m  Well nourished, well developed, in no acute distress  HEENT: normal, [place]/AT, EOMI Neck: no JVD, normal carotid upstroke, no bruit Cardiac:  normal S1, S2; RRR; no murmur  Lungs:  clear to auscultation bilaterally, no wheezing, rhonchi or rales  Abd: soft, nontender, no hepatomegaly, no bruits  Ext: no edema, 2+ distal pulses Skin: warm and dry  GU: deferred Neuro: no focal abnormalities noted, AAO x 3  EKG:   EKG ordered today 05/23/16 shows sinus rhythm, 66 with nonspecific T-wave changes, T-wave inversion in V2 noted. Personally viewed -02/19/15-sinus rhythm, 66, nonspecific ST-T wave changes personally viewed-prior 12/13/13-sinus rhythm, PVC, nonspecific ST-T wave changes,  heart rate 69, QTC 454 ms      11/05/15-LDL 113  ASSESSMENT AND PLAN:  1. Coronary artery disease-status post bypass in 03/2002 x 5. Overall doing well without any anginal symptoms. One episode of chest pressure which she has not had since stopping Crestor. Continuing with secondary prevention. 2. Minor carotid artery plaque-study from 03/10/16 reviewed. No repeat study at  this time. 3. Dermatomyositis-symptoms originally in 2010. Apparently this has resolved. 4. Hyperlipidemia--off Niaspan. Tried lipitor and zocor but felt body aches. Crestor - leg aches. LDL went from 128 down to 88 with Crestor but she did not tolerate. She is now back on pravastatin 80 mg once a day. Try Zetia -would like for her to be less than 100 we will check at next visit. 5. Statin intolerance-Crestor, Lipitor, Zocor.Seems to be tolerating pravastatin currently.  We will see her back in 6 month follow up.  Signed, Candee Furbish, MD [facility]  05/23/2016 3:16 PM

## 2016-05-23 NOTE — Patient Instructions (Signed)
Medication Instructions:  Please start Zetia 10 mg a day. Continue all other medications as listed.  Labwork: Please have fasting lipid panel/ALT a few days before seeing Dr Marlou Porch back in 6 months.  Follow-Up: Follow up 6 months with Dr. Marlou Porch.  You will receive a letter in the mail 2 months before you are due.  Please call us when you receive this letter to schedule your follow up appointment.  If you need a refill on your cardiac medications before your next appointment, please call your pharmacy.  Thank you for choosing [facility]!!

## 2016-06-09 ENCOUNTER — Ambulatory Visit (INDEPENDENT_AMBULATORY_CARE_PROVIDER_SITE_OTHER): Payer: PPO | Admitting: Family Medicine

## 2016-06-09 ENCOUNTER — Ambulatory Visit (INDEPENDENT_AMBULATORY_CARE_PROVIDER_SITE_OTHER)
Admission: RE | Admit: 2016-06-09 | Discharge: 2016-06-09 | Disposition: A | Discharge status: Home / Self Care | Payer: PPO | Source: Ambulatory Visit | Attending: Family Medicine | Admitting: Family Medicine

## 2016-06-09 ENCOUNTER — Encounter: Payer: Self-pay | Admitting: Family Medicine

## 2016-06-09 VITALS — BP 110/60 | HR 76 | Temp 98.4°F | Ht 64.0 in | Wt 147.0 lb

## 2016-06-09 DIAGNOSIS — M1611 Unilateral primary osteoarthritis, right hip: Secondary | ICD-10-CM

## 2016-06-09 DIAGNOSIS — M25551 Pain in right hip: Secondary | ICD-10-CM

## 2016-06-09 MED ORDER — TRAMADOL HCL 50 MG PO TABS: 50.0000 mg | ORAL_TABLET | Freq: Four times a day (QID) | ORAL | 2 refills | Status: AC | PRN

## 2016-06-09 NOTE — Progress Notes (Signed)
Pre visit review using our clinic review tool, if applicable. No additional management support is needed unless otherwise documented below in the visit note. 

## 2016-06-09 NOTE — Progress Notes (Signed)
Dr. Frederico Hamman T. Rogan Wigley, MD, [facility] [place], [address] Phone: [phone] Fax: [phone]  06/09/2016  Patient: Kathleen Williamson, MRN: [id number], DOB: 11-08-1940, 76 y.o.  Primary Physician:  [name], MD   Chief Complaint  Patient presents with  . Hip Pain    Right   Subjective:   Kathleen Williamson is a 76 y.o. very pleasant female patient who presents with the following:  R hip has been bothering her for a few years. Now cannot walkng a distance. If grocery shopping, ok Track - about to kill her.  Tried ice, heat, aspercreme. Tylenol is tolerable.  She is unable to tolerate oral NSAIDs. She does take some tumor ache, which she thinks does help.  No trauma or injury.  No significant back pain. Primarily she is having groin pain as well as some pain in the posterior of her buttocks and the lower region.  Limited range of motion.  Past Medical History, Surgical History, Social History, Family History, Problem List, Medications, and Allergies have been reviewed and updated if relevant.  Patient Active Problem List   Diagnosis Date Noted  . Cloudy urine 05/09/2016  . Health maintenance examination 02/07/2016  . Baker's cyst of knee 08/01/2015  . Diabetes mellitus type 2, controlled, with complications ([place]) [id number]  . Subclavian steal syndrome   . Medicare annual wellness visit, subsequent 01/31/2015  . Advanced care planning/counseling discussion 01/31/2015  . Carotid stenosis 01/31/2015  . Vertebral artery stenosis/occlusion 01/31/2015  . CKD (chronic kidney disease) stage 3, GFR 30-59 ml/min 01/21/2015  . Microscopic colitis 10/26/2014  . Osteoporosis   . Hypothyroidism   . Dermatomyositis ([place])   . MDD (major depressive disorder), recurrent episode, moderate ([place]) 01/12/2014  . Atherosclerosis of native coronary artery of native heart without angina pectoris 12/13/2013  . Pure  hypercholesterolemia 12/13/2013  . Pseudocyst of pancreas 08/29/2011    Past Medical History:  Diagnosis Date  . Arthritis    fingers  . CAD (coronary artery disease) 2003   MI s/p 5v CABG  . Carotid stenosis    [place]  . CKD (chronic kidney disease) stage 3, GFR 30-59 ml/min   . Dermatomyositis [facility])    saw Dr Estanislado Pandy, improved on its own  . History of chicken pox   . History of measles   . History of migraine none since 2003  . HLD (hyperlipidemia)   . Hypothyroidism   . MDD (major depressive disorder), recurrent episode, moderate ([place])    h/o SI after lost husband unexpectedly 2015  . Microscopic colitis 05/2014   by colonoscopy, lymphocytic, zoloft related  . Osteoporosis    DEXA 03/2013 T -3.1, DEXA 10//2016 -2.9 hip, -2.2 spine  . Pancreatic cyst    Dr Ralene Ok, no f/u needed  . Prediabetes 2014  . Subclavian steal syndrome   . Urine incontinence     Past Surgical History:  Procedure Laterality Date  . APPENDECTOMY  1997   with hysterectomy  . [place]   lower  . BREAST LUMPECTOMY Left 1977   benign  . COLONOSCOPY WITH PROPOFOL N/A 05/23/2014   microscopic colitis - Garlan Fair, MD  . CORONARY ARTERY BYPASS GRAFT  2003   x 5 v  . ESOPHAGOGASTRODUODENOSCOPY (EGD) WITH PROPOFOL N/A 05/23/2014   WNL Garlan Fair, MD  . [place] Right 1990's  . TOOTH EXTRACTION  yrs ago  . TOTAL ABDOMINAL HYSTERECTOMY  W/ BILATERAL SALPINGOOPHORECTOMY  1997   endometriosis    Social History   Social History  . Marital status: Widowed    Spouse name: N/A  . Number of children: N/A  . Years of education: N/A   Occupational History  . Not on file.   Social History Main Topics  . Smoking status: Never Smoker  . Smokeless tobacco: Never Used  . Alcohol use No  . Drug use: No  . Sexual activity: Not on file   Other Topics Concern  . Not on file   Social History Narrative   Lives alone at [facility].   Husband of 70 yrs died suddenly  2013-11-07.   No children, no siblings.    Close friends Bethena Roys and Ace Gins nearby, cousin in [place].   Activity: going to [employer]    Family History  Problem Relation Age of Onset  . CAD Father 52  . CAD Mother 67  . Diabetes Mother   . Rheum arthritis Mother   . Cancer Maternal Aunt     breast  . CAD Other     strong paternal side  . Stroke Neg Hx     Allergies  Allergen Reactions  . Sulfa Antibiotics Nausea Only  . Crestor [Rosuvastatin Calcium] Other (See Comments)    Leg ache    Medication list reviewed and updated in full in [place].  GEN: No fevers, chills. Nontoxic. Primarily MSK c/o today. MSK: Detailed in the HPI GI: tolerating PO intake without difficulty Neuro: No numbness, parasthesias, or tingling associated. Otherwise the pertinent positives of the ROS are noted above.   Objective:   BP 110/60   Pulse 76   Temp 98.4 F (36.9 C) (Oral)   Ht 5\' 4"  (1.626 m)   Wt 147 lb (66.7 kg)   LMP  (LMP Unknown)   BMI 25.23 kg/m    GEN: WDWN, NAD, Non-toxic, Alert & Oriented x 3 HEENT: Atraumatic, Normocephalic.  Ears and Nose: No external deformity. EXTR: No clubbing/cyanosis/edema NEURO: Normal gait.  PSYCH: Normally interactive. Conversant. Not depressed or anxious appearing.  Calm demeanor.   HIP EXAM: SIDE: R ROM: Abduction, Flexion, Internal and External range of motion: abduction at approximately 12.  In hip flexion to 90 there is approximate 15 of total motion. Pain with terminal IROM and EROM: yes, worse with terminal internal range of motion. GTB: NT SLR: NEG Knees: No effusion FABER: unable to complete REVERSE FABER: unable to complete Piriformis: NT at direct palpation Str: flexion: 4/5 abduction: 4/5 adduction: 4/5 Strength testing non-tender   Radiology: Dg Hip Unilat With Pelvis 2-3 Views Right  Result Date: 06/09/2016 CLINICAL DATA:  Chronic right hip pain.  History of osteoporosis EXAM: DG HIP (WITH OR WITHOUT PELVIS) 2-3V  RIGHT COMPARISON:  [place] radiograph from an abdominal CT scan of Aug 26, 2011 FINDINGS: The bony pelvis is subjectively osteopenic. There are degenerative disc changes at the L4-5 disc level. There is mild degenerative change of the SI joints. No lytic or blastic pelvic lesion is observed. AP and lateral views of the right hip reveal asymmetric joint space loss greatest superiorly. The articular surfaces of the femoral head and acetabulum remains smoothly rounded. The femoral neck, intertrochanteric, and immediate sub trochanteric regions appear normal where visualized. IMPRESSION: Moderate to severe osteoarthritic joint space loss of the right hip. No acute bony abnormality. Electronically Signed   By: David  Martinique M.D.   On: 06/09/2016 11:03    Assessment and Plan:  Primary osteoarthritis of right hip - Plan: DG FLUORO GUIDED NEEDLE PLC ASPIRATION/INJECTION LOC  Pain of right hip joint - Plan: DG HIP UNILAT WITH PELVIS 2-3 VIEWS RIGHT  The patient has moderate to advanced arthritis on the right side radiographically as well as clinically.  Continue with Tylenol as needed and add Ultram as needed.  Trial of an intra-articular corticosteroid injection to see if this buys her some time in relief.  At some point in the relatively near future, the patient will likely consider total hip arthroplasty given the severity of her disease.  Follow-up: when needed  Meds ordered this encounter  Medications  . traMADol (ULTRAM) 50 MG tablet    Sig: Take 1 tablet (50 mg total) by mouth every 6 (six) hours as needed.    Dispense:  50 tablet    Refill:  2   Orders Placed This Encounter  Procedures  . DG HIP UNILAT WITH PELVIS 2-3 VIEWS RIGHT  . DG FLUORO GUIDED NEEDLE PLC ASPIRATION/INJECTION LOC    Signed,  Rashawn Rolon T. Chosen Geske, MD   Allergies as of 06/09/2016      Reactions   Sulfa Antibiotics Nausea Only   Crestor [rosuvastatin Calcium] Other (See Comments)   Leg ache      Medication List         Accurate as of 06/09/16 11:59 PM. Always use your most recent med list.          aspirin EC 81 MG tablet Take 81 mg by mouth at bedtime.   Biotin 5 MG Caps Take 1 capsule by mouth daily.   Calcium Carbonate-Vitamin D 600-400 MG-UNIT tablet Take 1 tablet by mouth 2 (two) times daily.   clonazePAM 0.5 MG tablet Commonly known as:  KLONOPIN TAKE 1 TABLET BY MOUTH TWICE A DAY as needed   CoQ10 100 MG Caps Take 1 capsule by mouth at bedtime.   ezetimibe 10 MG tablet Commonly known as:  ZETIA Take 1 tablet (10 mg total) by mouth daily.   levothyroxine 50 MCG tablet Commonly known as:  SYNTHROID, LEVOTHROID TAKE 1 TABLET EVERY DAY   loperamide 2 MG tablet Commonly known as:  IMODIUM A-D Take 2 mg by mouth every morning.   mirtazapine 15 MG tablet Commonly known as:  REMERON TAKE 1 TABLET BEFORE BEDTIME AT BEDTIME ORALLY 30 DAY(S)   nitroGLYCERIN 0.4 MG/SPRAY spray Commonly known as:  NITROLINGUAL Place 1 spray under the tongue every 5 (five) minutes as needed.   pravastatin 80 MG tablet Commonly known as:  PRAVACHOL Take 1 tablet (80 mg total) by mouth every evening.   sertraline 100 MG tablet Commonly known as:  ZOLOFT TAKE 1 TABLET BY MOUTH EVERY DAY   traMADol 50 MG tablet Commonly known as:  ULTRAM Take 1 tablet (50 mg total) by mouth every 6 (six) hours as needed.   Turmeric 500 MG Caps Take 1 capsule by mouth daily.   vitamin B-12 1000 MCG tablet Commonly known as:  CYANOCOBALAMIN Take 1,000 mcg by mouth daily.   vitamin C 1000 MG tablet Take 1,000 mg by mouth daily.

## 2016-06-09 NOTE — Patient Instructions (Signed)

## 2016-06-17 ENCOUNTER — Ambulatory Visit
Admission: RE | Admit: 2016-06-17 | Discharge: 2016-06-17 | Disposition: A | Discharge status: Home / Self Care | Payer: PPO | Source: Ambulatory Visit | Attending: Family Medicine | Admitting: Family Medicine

## 2016-06-17 DIAGNOSIS — M1611 Unilateral primary osteoarthritis, right hip: Secondary | ICD-10-CM

## 2016-06-17 MED ORDER — METHYLPREDNISOLONE ACETATE 40 MG/ML INJ SUSP (RADIOLOG
120.0000 mg | Freq: Once | INTRAMUSCULAR | Status: AC
  Administered 2016-06-17: 120 mg via INTRA_ARTICULAR

## 2016-06-17 MED ORDER — IOPAMIDOL (ISOVUE-M 200) INJECTION 41%
1.0000 mL | Freq: Once | INTRAMUSCULAR | Status: AC
  Administered 2016-06-17: 1 mL via INTRA_ARTICULAR

## 2016-06-20 ENCOUNTER — Other Ambulatory Visit: Payer: Self-pay | Admitting: Family Medicine

## 2016-06-20 NOTE — Telephone Encounter (Signed)
Ok to refill in Dr. Synthia Innocent absence? Last filled 02/22/16  #[phone] RF

## 2016-06-22 NOTE — Telephone Encounter (Signed)
Please call in.  Thanks.   

## 2016-06-23 NOTE — Telephone Encounter (Signed)
Pt left v/m requesting status of clonazepam rx to [place]. Left v/m per DPR refill called in today around 10 AM.-

## 2016-06-23 NOTE — Telephone Encounter (Signed)
Rx called to pharmacy as instructed. 

## 2016-06-30 ENCOUNTER — Other Ambulatory Visit: Payer: Self-pay | Admitting: Family Medicine

## 2016-07-01 ENCOUNTER — Other Ambulatory Visit: Payer: Self-pay | Admitting: Family Medicine

## 2016-07-02 ENCOUNTER — Other Ambulatory Visit: Payer: Self-pay | Admitting: Family Medicine

## 2016-07-07 ENCOUNTER — Ambulatory Visit: Payer: PPO | Admitting: Family Medicine

## 2016-07-20 ENCOUNTER — Other Ambulatory Visit: Payer: Self-pay | Admitting: Family Medicine

## 2016-07-21 ENCOUNTER — Other Ambulatory Visit: Payer: Self-pay | Admitting: Family Medicine

## 2016-07-21 NOTE — Telephone Encounter (Signed)
plz phone in. 

## 2016-07-21 NOTE — Telephone Encounter (Signed)
Ok to refill? Last filled 06/22/16 #60 0RF

## 2016-07-22 NOTE — Telephone Encounter (Signed)
Rx called in as directed.   

## 2016-07-28 ENCOUNTER — Encounter: Payer: Self-pay | Admitting: Family Medicine

## 2016-07-28 ENCOUNTER — Ambulatory Visit (INDEPENDENT_AMBULATORY_CARE_PROVIDER_SITE_OTHER): Payer: PPO | Admitting: Family Medicine

## 2016-07-28 VITALS — BP 118/60 | HR 79 | Temp 97.5°F | Wt 149.0 lb

## 2016-07-28 DIAGNOSIS — N3001 Acute cystitis with hematuria: Secondary | ICD-10-CM | POA: Diagnosis not present

## 2016-07-28 LAB — POC URINALSYSI DIPSTICK (AUTOMATED)
Bilirubin, UA: NEGATIVE
Glucose, UA: NEGATIVE
Ketones, UA: NEGATIVE
Nitrite, UA: NEGATIVE
Spec Grav, UA: 1.02
Urobilinogen, UA: 0.2 U/dL
pH, UA: 6

## 2016-07-28 MED ORDER — CEPHALEXIN 500 MG PO CAPS: 500.0000 mg | ORAL_CAPSULE | Freq: Two times a day (BID) | ORAL | 0 refills | Status: DC

## 2016-07-28 NOTE — Progress Notes (Signed)
Pre visit review using our clinic review tool, if applicable. No additional management support is needed unless otherwise documented below in the visit note. 

## 2016-07-28 NOTE — Addendum Note (Signed)
Addended by: Pilar Grammes on: 07/28/2016 12:19 PM   Modules accepted: Orders

## 2016-07-28 NOTE — Patient Instructions (Signed)

## 2016-07-28 NOTE — Progress Notes (Signed)
SUBJECTIVE: Kathleen Williamson is a 76 y.o. female who complains of urinary frequency, urgency with some incontinence, and dysuria x 3 days, without flank pain, fever, chills, or abnormal vaginal discharge or bleeding.   Pt is allergic to sulfa.  Of note, had UTI in 04/2016- cx great GBS. Treated with Keflex.  Current Outpatient Prescriptions on File Prior to Visit  Medication Sig Dispense Refill  . aspirin EC 81 MG tablet Take 81 mg by mouth at bedtime.    . Biotin 5 MG CAPS Take 1 capsule by mouth daily.    . Calcium Carbonate-Vitamin D 600-400 MG-UNIT tablet Take 1 tablet by mouth 2 (two) times daily.    . clonazePAM (KLONOPIN) 0.5 MG tablet TAKE 1 TABLET BY MOUTH TWICE A DAY AS NEEDED 60 tablet 0  . Coenzyme Q10 (COQ10) 100 MG CAPS Take 1 capsule by mouth at bedtime.     Marland Kitchen ezetimibe (ZETIA) 10 MG tablet Take 1 tablet (10 mg total) by mouth daily. 90 tablet 3  . levothyroxine (SYNTHROID, LEVOTHROID) 50 MCG tablet TAKE 1 TABLET EVERY DAY 90 tablet 3  . loperamide (IMODIUM A-D) 2 MG tablet Take 2 mg by mouth every morning.    . mirtazapine (REMERON) 15 MG tablet TAKE 1 TABLET BY MOUTH AT BEDTIME 30 tablet 6  . nitroGLYCERIN (NITROLINGUAL) 0.4 MG/SPRAY spray Place 1 spray under the tongue every 5 (five) minutes as needed. 12 g prn  . pravastatin (PRAVACHOL) 80 MG tablet Take 1 tablet (80 mg total) by mouth every evening. 90 tablet 3  . sertraline (ZOLOFT) 100 MG tablet TAKE 1 TABLET BY MOUTH EVERY DAY 90 tablet 3  . Turmeric 500 MG CAPS Take 1 capsule by mouth daily.    . vitamin B-12 (CYANOCOBALAMIN) 1000 MCG tablet Take 1,000 mcg by mouth daily.     No current facility-administered medications on file prior to visit.     Allergies  Allergen Reactions  . Sulfa Antibiotics Nausea Only  . Crestor [Rosuvastatin Calcium] Other (See Comments)    Leg ache    Past Medical History:  Diagnosis Date  . Arthritis    fingers  . CAD (coronary artery disease) 2003   MI s/p 5v CABG  . Carotid  stenosis    [place]  . CKD (chronic kidney disease) stage 3, GFR 30-59 ml/min   . Dermatomyositis [facility])    saw Dr Estanislado Pandy, improved on its own  . History of chicken pox   . History of measles   . History of migraine none since 2003  . HLD (hyperlipidemia)   . Hypothyroidism   . MDD (major depressive disorder), recurrent episode, moderate ([place])    h/o SI after lost husband unexpectedly 2015  . Microscopic colitis 05/2014   by colonoscopy, lymphocytic, zoloft related  . Osteoporosis    DEXA 03/2013 T -3.1, DEXA 10//2016 -2.9 hip, -2.2 spine  . Pancreatic cyst    Dr Ralene Ok, no f/u needed  . Prediabetes 2014  . Subclavian steal syndrome   . Urine incontinence     Past Surgical History:  Procedure Laterality Date  . APPENDECTOMY  1997   with hysterectomy  . [place]   lower  . BREAST LUMPECTOMY Left 1977   benign  . COLONOSCOPY WITH PROPOFOL N/A 05/23/2014   microscopic colitis - Garlan Fair, MD  . CORONARY ARTERY BYPASS GRAFT  2003   x 5 v  . ESOPHAGOGASTRODUODENOSCOPY (EGD) WITH PROPOFOL N/A 05/23/2014   WNL Garlan Fair,  MD  . ROTATOR CUFF REPAIR Right 1990's  . TOOTH EXTRACTION  yrs ago  . TOTAL ABDOMINAL HYSTERECTOMY W/ BILATERAL SALPINGOOPHORECTOMY  1997   endometriosis    Family History  Problem Relation Age of Onset  . CAD Father 45  . CAD Mother 24  . Diabetes Mother   . Rheum arthritis Mother   . Cancer Maternal Aunt     breast  . CAD Other     strong paternal side  . Stroke Neg Hx     Social History   Social History  . Marital status: Widowed    Spouse name: N/A  . Number of children: N/A  . Years of education: N/A   Occupational History  . Not on file.   Social History Main Topics  . Smoking status: Never Smoker  . Smokeless tobacco: Never Used  . Alcohol use No  . Drug use: No  . Sexual activity: Not on file   Other Topics Concern  . Not on file   Social History Narrative   Lives alone at [facility]   Husband of  72 yrs died suddenly 11/05/13.   No children, no siblings.    Close friends Bethena Roys and Ace Gins nearby, cousin in [place].   Activity: going to [employer]   The PMH, PSH, Social History, Family History, Medications, and allergies have been reviewed in [facility], and have been updated if relevant.  OBJECTIVE:  BP 118/60 (BP Location: Left Arm, Patient Position: Sitting, Cuff Size: Normal)   Pulse 79   Temp 97.5 F (36.4 C) (Oral)   Wt 149 lb (67.6 kg)   LMP  (LMP Unknown)   SpO2 98%   BMI 25.58 kg/m   Appears well, in no apparent distress.  Vital signs are normal. The abdomen is soft without tenderness, guarding, mass, rebound or organomegaly. No CVA tenderness or inguinal adenopathy noted. Urine dipstick shows positive for WBC's, positive for RBC's and positive for protein.    ASSESSMENT: UTI uncomplicated without evidence of pyelonephritis  PLAN: Treatment per orders - keflex 500 mg twice daily x 7 days, send urine for cx.  also push fluids, may use Pyridium OTC prn. Call or return to clinic prn if these symptoms worsen or fail to improve as anticipated.

## 2016-07-29 LAB — URINE CULTURE: Organism ID, Bacteria: NO GROWTH

## 2016-08-18 ENCOUNTER — Other Ambulatory Visit: Payer: Self-pay | Admitting: Family Medicine

## 2016-08-18 NOTE — Telephone Encounter (Signed)
Electronic refill request. Last office visit:   07/28/16 Last Filled:    60 tablet 0 07/21/2016  Please advise.

## 2016-08-19 NOTE — Telephone Encounter (Signed)
Rx called in to requested pharmacy 

## 2016-08-19 NOTE — Telephone Encounter (Signed)
plz phoen in. 

## 2016-09-05 ENCOUNTER — Encounter: Payer: Self-pay | Admitting: Family Medicine

## 2016-09-05 ENCOUNTER — Ambulatory Visit (INDEPENDENT_AMBULATORY_CARE_PROVIDER_SITE_OTHER): Payer: PPO | Admitting: Family Medicine

## 2016-09-05 VITALS — BP 124/68 | HR 58 | Temp 97.7°F | Wt 151.2 lb

## 2016-09-05 DIAGNOSIS — F331 Major depressive disorder, recurrent, moderate: Secondary | ICD-10-CM | POA: Diagnosis not present

## 2016-09-05 DIAGNOSIS — Z23 Encounter for immunization: Secondary | ICD-10-CM

## 2016-09-05 DIAGNOSIS — S91351A Open bite, right foot, initial encounter: Secondary | ICD-10-CM | POA: Diagnosis not present

## 2016-09-05 DIAGNOSIS — N183 Chronic kidney disease, stage 3 unspecified: Secondary | ICD-10-CM

## 2016-09-05 DIAGNOSIS — W5501XA Bitten by cat, initial encounter: Secondary | ICD-10-CM | POA: Diagnosis not present

## 2016-09-05 DIAGNOSIS — E1121 Type 2 diabetes mellitus with diabetic nephropathy: Secondary | ICD-10-CM

## 2016-09-05 LAB — RENAL FUNCTION PANEL
ALBUMIN: 4.1 g/dL (ref 3.5–5.2)
BUN: 15 mg/dL (ref 6–23)
CALCIUM: 9.6 mg/dL (ref 8.4–10.5)
CO2: 27 mEq/L (ref 19–32)
CREATININE: 1.3 mg/dL — AB (ref 0.40–1.20)
Chloride: 106 mEq/L (ref 96–112)
GFR: 42.48 mL/min — ABNORMAL LOW (ref >60.00)
GLUCOSE: 107 mg/dL — AB (ref 70–99)
Phosphorus: 3.9 mg/dL (ref 2.3–4.6)
Potassium: 4.4 mEq/L (ref 3.5–5.1)
Sodium: 139 mEq/L (ref 135–145)

## 2016-09-05 LAB — HEMOGLOBIN A1C: Hgb A1c MFr Bld: 6.7 % — ABNORMAL HIGH (ref 4.6–6.5)

## 2016-09-05 MED ORDER — AMOXICILLIN-POT CLAVULANATE 875-125 MG PO TABS: 1.0000 | ORAL_TABLET | Freq: Two times a day (BID) | ORAL | 0 refills | Status: AC

## 2016-09-05 NOTE — Progress Notes (Signed)
BP 124/68   Pulse (!) 58   Temp 97.7 F (36.5 C) (Oral)   Wt 151 lb 4 oz (68.6 kg)   LMP  (LMP Unknown)   SpO2 98%   BMI 25.96 kg/m    CC: 4 mo f/u visit Subjective:    Patient ID: Kathleen Williamson, female    DOB: [birth year], 76 y.o.   MRN: [id number]  HPI: Kathleen Williamson is a 76 y.o. female presenting on 09/05/2016 for Follow-up   Lives at [facility]. Enjoys working at [employer].  Coming up on 3 yr anniversary of husband's death.   L knee popping with walking, no pain. She does use knee sleeve.   She was bit last night by cat.   DM - regularly does not check sugars.  Compliant with antihyperglycemic regimen which includes: diet controlled.  Denies low sugars or hypoglycemic symptoms.  Denies paresthesias. Last diabetic eye exam 02/2016.  Pneumovax: 2008.  Prevnar: 2017. Lab Results  Component Value Date   HGBA1C 6.5 05/09/2016   Diabetic Foot Exam - Simple   No data filed       Relevant past medical, surgical, family and social history reviewed and updated as indicated. Interim medical history since our last visit reviewed. Allergies and medications reviewed and updated. Outpatient Medications Prior to Visit  Medication Sig Dispense Refill  . aspirin EC 81 MG tablet Take 81 mg by mouth at bedtime.    . Biotin 5 MG CAPS Take 1 capsule by mouth daily.    . Calcium Carbonate-Vitamin D 600-400 MG-UNIT tablet Take 1 tablet by mouth 2 (two) times daily.    . clonazePAM (KLONOPIN) 0.5 MG tablet TAKE 1 TABLET BY MOUTH TWICE A DAY AS NEEDED 60 tablet 0  . Coenzyme Q10 (COQ10) 100 MG CAPS Take 1 capsule by mouth at bedtime.     Marland Kitchen levothyroxine (SYNTHROID, LEVOTHROID) 50 MCG tablet TAKE 1 TABLET EVERY DAY 90 tablet 3  . loperamide (IMODIUM A-D) 2 MG tablet Take 2 mg by mouth every morning.    . mirtazapine (REMERON) 15 MG tablet TAKE 1 TABLET BY MOUTH AT BEDTIME 30 tablet 6  . nitroGLYCERIN (NITROLINGUAL) 0.4 MG/SPRAY spray Place 1 spray under the tongue every 5 (five)  minutes as needed. 12 g prn  . pravastatin (PRAVACHOL) 80 MG tablet Take 1 tablet (80 mg total) by mouth every evening. 90 tablet 3  . sertraline (ZOLOFT) 100 MG tablet TAKE 1 TABLET BY MOUTH EVERY DAY 90 tablet 3  . Turmeric 500 MG CAPS Take 1 capsule by mouth daily.    . vitamin B-12 (CYANOCOBALAMIN) 1000 MCG tablet Take 1,000 mcg by mouth daily.    . cephALEXin (KEFLEX) 500 MG capsule Take 1 capsule (500 mg total) by mouth 2 (two) times daily. 14 capsule 0  . ezetimibe (ZETIA) 10 MG tablet Take 1 tablet (10 mg total) by mouth daily. 90 tablet 3   No facility-administered medications prior to visit.      Per HPI unless specifically indicated in ROS section below Review of Systems     Objective:    BP 124/68   Pulse (!) 58   Temp 97.7 F (36.5 C) (Oral)   Wt 151 lb 4 oz (68.6 kg)   LMP  (LMP Unknown)   SpO2 98%   BMI 25.96 kg/m   Wt Readings from Last 3 Encounters:  09/05/16 151 lb 4 oz (68.6 kg)  07/28/16 149 lb (67.6 kg)  06/09/16 147  lb (66.7 kg)    Physical Exam  Constitutional: She appears well-developed and well-nourished. No distress.  HENT:  Head: Normocephalic and atraumatic.  Right Ear: External ear normal.  Left Ear: External ear normal.  Nose: Nose normal.  Mouth/Throat: Oropharynx is clear and moist. No oropharyngeal exudate.  Eyes: Conjunctivae and EOM are normal. Pupils are equal, round, and reactive to light. No scleral icterus.  Neck: Normal range of motion. Neck supple.  Cardiovascular: Normal rate, regular rhythm, normal heart sounds and intact distal pulses.   No murmur heard. Pulmonary/Chest: Effort normal and breath sounds normal. No respiratory distress. She has no wheezes. She has no rales.  Musculoskeletal: She exhibits no edema.  See HPI for foot exam if done  Lymphadenopathy:    She has no cervical adenopathy.  Skin:  Small puncture wounds at dorsal R foot from recent cat bite, without surrounding erythema  Psychiatric: She has a normal  mood and affect.  Nursing note and vitals reviewed.  Results for orders placed or performed in visit on 07/28/16  Urine culture  Result Value Ref Range   Organism ID, Bacteria NO GROWTH   POCT Urinalysis Dipstick (Automated)  Result Value Ref Range   Color, UA light yellow    Clarity, UA cloudy    Glucose, UA negative    Bilirubin, UA negative    Ketones, UA negative    Spec Grav, UA 1.020 1.010 - 1.025   Blood, UA 3+    pH, UA 6.0 5.0 - 8.0   Protein, UA 1+    Urobilinogen, UA 0.2 0.2 or 1.0 E.U./dL   Nitrite, UA negative    Leukocytes, UA Large (3+) (A) Negative      Assessment & Plan:   Problem List Items Addressed This Visit    Cat bite of right foot    No signs of infection currently. Treat preventatively with augmentin. Cat shots UTD. Update Td.       CKD (chronic kidney disease) stage 3, GFR 30-59 ml/min    Update labs.       Relevant Orders   Renal function panel   Controlled type 2 diabetes mellitus with diabetic nephropathy ([place]) - Primary    Update A1c. Diet controlled.      Relevant Orders   Hemoglobin A1c   MDD (major depressive disorder), recurrent episode, moderate ([facility])    Ongoing trouble, stable on sertraline and klonopin. Support provided. Coming onto husband's 3 yr death anniversary          Follow up plan: Return in about 4 months (around 01/06/2017) for annual exam, prior fasting for blood work, medicare wellness visit.  [name], MD

## 2016-09-05 NOTE — Assessment & Plan Note (Signed)
Update A1c. Diet controlled.  

## 2016-09-05 NOTE — Addendum Note (Signed)
Addended by: Modena Nunnery on: 09/05/2016 10:14 AM   Modules accepted: Orders

## 2016-09-05 NOTE — Assessment & Plan Note (Addendum)
No signs of infection currently. Treat preventatively with augmentin. Cat shots UTD. Update Td.

## 2016-09-05 NOTE — Patient Instructions (Addendum)
Labs today.  You are doing well.  Continue current medicines Return in 4 months for wellness visit with Katha Cabal and physical with me For cat bite - treat with augmentin. Update tetanus today.

## 2016-09-05 NOTE — Assessment & Plan Note (Signed)
Update labs.  

## 2016-09-05 NOTE — Assessment & Plan Note (Signed)
Ongoing trouble, stable on sertraline and klonopin. Support provided. Coming onto husband's 3 yr death anniversary

## 2016-09-17 ENCOUNTER — Other Ambulatory Visit: Payer: Self-pay | Admitting: Family Medicine

## 2016-09-17 NOTE — Telephone Encounter (Signed)
Received refill electronically Last refill 08/19/16 #60 Last office visit 09/05/16

## 2016-09-19 ENCOUNTER — Other Ambulatory Visit: Payer: Self-pay | Admitting: Family Medicine

## 2016-09-19 NOTE — Telephone Encounter (Signed)
plz phone in. 

## 2016-09-19 NOTE — Telephone Encounter (Signed)
Rx called in to requested pharmacy 

## 2016-10-16 ENCOUNTER — Other Ambulatory Visit: Payer: Self-pay | Admitting: Family Medicine

## 2016-10-16 NOTE — Telephone Encounter (Signed)
CPE scheduled for 02/17/17, last filled on 09/19/16 #60 tabs with 0 refills, please advise

## 2016-10-17 NOTE — Telephone Encounter (Signed)
plz phone in. 

## 2016-10-17 NOTE — Telephone Encounter (Signed)
Rx called in to requested pharmacy 

## 2016-10-20 ENCOUNTER — Telehealth: Payer: Self-pay | Admitting: Family Medicine

## 2016-10-20 NOTE — Telephone Encounter (Signed)
[facility]  Patient Name: Kathleen Williamson  DOB: 10/24/1940    Initial Comment [name] states has severe depression and is needing help.    Nurse Assessment  Nurse: Harlow Mares, RN, Suanne Marker Date/Time (Eastern Time): 10/20/2016 4:22:36 PM  Confirm and document reason for call. If symptomatic, describe symptoms. ---[name] states has severe depression and is needing help. Reports that she has taken Zoloft for years but she has felt more depressed in the lat couple of weeks. Reports that she feels like there has been a death in the family, but there has not been. She cannot figure out why she feels this way. She is feeling shaky in the mornings, is sleeping, able to eat.  Does the patient have any new or worsening symptoms? ---Yes  Will a triage be completed? ---Yes  Related visit to physician within the last 2 weeks? ---No  Does the PT have any chronic conditions? (i.e. diabetes, asthma, etc.) ---Yes  List chronic conditions. ---depression;  Is this a behavioral health or substance abuse call? ---Yes  Are you having any thoughts or feelings of harming or killing yourself or someone else? ---No  Are you currently experiencing any physical discomfort that you think may be related to the use of alcohol or other drugs? (use substance abuse or alcohol abuse guidelines. These include withdrawal symptoms) ---No  Do you worry that you may be hearing or seeing things that others do not? ---No  Do you take medications for your condition(s)? ---Yes  List medications here. ---Mirtazapine 15mg  @ hs and Zoloft 150 mg daily; Clonazepam .5mg  BID     Guidelines    Guideline Title Affirmed Question Affirmed Notes  Depression [1] Depression AND [2] worsening (e.g.,sleeping poorly, less able to do activities of daily living)    Final Disposition User   See Physician within 24 Hours [place], [place], Suanne Marker    Comments  Appt made for Dr.  Danise Mina at the [facility] location for tomorrow morning at 8:15pm.   Referrals  REFERRED TO PCP OFFICE   Disagree/Comply: Leta Baptist

## 2016-10-20 NOTE — Telephone Encounter (Signed)
Pt has appt to see Dr Darnell Level 10/21/16 at 8:15.

## 2016-10-20 NOTE — Telephone Encounter (Signed)
I spoke with pt and she is not SI/HI. Advised pt if condition worsened to night to go to ED. Pt said she can wait for appt in AM. FYI to Dr Darnell Level.

## 2016-10-21 ENCOUNTER — Encounter: Payer: Self-pay | Admitting: Family Medicine

## 2016-10-21 ENCOUNTER — Ambulatory Visit (INDEPENDENT_AMBULATORY_CARE_PROVIDER_SITE_OTHER): Payer: PPO | Admitting: Family Medicine

## 2016-10-21 VITALS — BP 126/84 | HR 60 | Temp 97.4°F | Ht 64.0 in | Wt 151.0 lb

## 2016-10-21 DIAGNOSIS — F4329 Adjustment disorder with other symptoms: Secondary | ICD-10-CM | POA: Insufficient documentation

## 2016-10-21 DIAGNOSIS — F331 Major depressive disorder, recurrent, moderate: Secondary | ICD-10-CM | POA: Diagnosis not present

## 2016-10-21 DIAGNOSIS — Z634 Disappearance and death of family member: Secondary | ICD-10-CM

## 2016-10-21 DIAGNOSIS — F4321 Adjustment disorder with depressed mood: Secondary | ICD-10-CM | POA: Insufficient documentation

## 2016-10-21 MED ORDER — CITALOPRAM HYDROBROMIDE 20 MG PO TABS: 20.0000 mg | ORAL_TABLET | Freq: Every day | ORAL | 6 refills | Status: DC

## 2016-10-21 NOTE — Assessment & Plan Note (Signed)
Discussed ongoing struggles. Support provided.  Will change SSRI - from sertraline (on this for last 20 yrs) to celexa 20mg  daily. Continue remeron and klonopin. I also recommended counseling - pt will check with local [facility] which has available therapy services - if not able to get in there she will let us know for referral to local counselor.  RTC 1 mo f/u visit.

## 2016-10-21 NOTE — Patient Instructions (Addendum)
Let's change sertraline to celexa 20mg  daily. May stop one medicine one day and start other medicine the next day. Continue remeron (mirtazapine) Let me know if you aren't able to get into [facility] through [facility].  Return in 4-6 weeks for follow up visit.

## 2016-10-21 NOTE — Progress Notes (Signed)
BP 126/84   Pulse 60   Temp (!) 97.4 F (36.3 C)   Ht 5\' 4"  (1.626 m)   Wt 151 lb (68.5 kg)   LMP  (LMP Unknown)   BMI 25.92 kg/m    CC: discuss worsening depression Subjective:    Patient ID: Kathleen Williamson, female    DOB: [birth year], 76 y.o.   MRN: [id number]  HPI: Kathleen Williamson is a 76 y.o. female presenting on 10/21/2016 for Depression   Lives at [facility]. Enjoys working at [employer].  3 yr anniversary of husband's death last month.   Known chronic depression on sertraline 100mg  daily for last 20 yrs, remeron 15mg  nightly, and klonopin 0.5mg  bid prn.   Marked worsening burden sensation - struggling with loneliness, increased responsibilities, "I feel like there's been a death in the family" but no recent deaths. Worsening anhedonia, some social isolation. She dropped out of all her church activities - doesn't feel included anymore. This is a different life without her husband. "I have no future, nothing to look forward to".   She did go to grief sessions in [facility] 1 yr ago - felt this was useless. She also participated in hospice therapy and felt this worsened symptoms as well.   Denies SI/HI.   [facility] offers counseling through [facility].   Lab Results  Component Value Date   TSH 2.12 02/04/2016     Relevant past medical, surgical, family and social history reviewed and updated as indicated. Interim medical history since our last visit reviewed. Allergies and medications reviewed and updated. Outpatient Medications Prior to Visit  Medication Sig Dispense Refill  . aspirin EC 81 MG tablet Take 81 mg by mouth at bedtime.    . Biotin 5 MG CAPS Take 1 capsule by mouth daily.    . Calcium Carbonate-Vitamin D 600-400 MG-UNIT tablet Take 1 tablet by mouth 2 (two) times daily.    . clonazePAM (KLONOPIN) 0.5 MG tablet TAKE 1 TABLET BY MOUTH TWICE (2) DAILY AS NEEDED 60 tablet 0  . Coenzyme Q10 (COQ10) 100 MG CAPS Take 1 capsule by  mouth at bedtime.     Marland Kitchen levothyroxine (SYNTHROID, LEVOTHROID) 50 MCG tablet TAKE 1 TABLET EVERY DAY 90 tablet 3  . loperamide (IMODIUM A-D) 2 MG tablet Take 2 mg by mouth every morning.    . mirtazapine (REMERON) 15 MG tablet TAKE 1 TABLET BY MOUTH AT BEDTIME 30 tablet 6  . nitroGLYCERIN (NITROLINGUAL) 0.4 MG/SPRAY spray Place 1 spray under the tongue every 5 (five) minutes as needed. 12 g prn  . pravastatin (PRAVACHOL) 80 MG tablet Take 1 tablet (80 mg total) by mouth every evening. 90 tablet 3  . Turmeric 500 MG CAPS Take 1 capsule by mouth daily.    . vitamin B-12 (CYANOCOBALAMIN) 1000 MCG tablet Take 1,000 mcg by mouth daily.    . sertraline (ZOLOFT) 100 MG tablet TAKE 1 TABLET BY MOUTH EVERY DAY 90 tablet 3  . ezetimibe (ZETIA) 10 MG tablet Take 1 tablet (10 mg total) by mouth daily. 90 tablet 3   No facility-administered medications prior to visit.      Per HPI unless specifically indicated in ROS section below Review of Systems     Objective:    BP 126/84   Pulse 60   Temp (!) 97.4 F (36.3 C)   Ht 5\' 4"  (1.626 m)   Wt 151 lb (68.5 kg)   LMP  (LMP Unknown)  BMI 25.92 kg/m   Wt Readings from Last 3 Encounters:  10/21/16 151 lb (68.5 kg)  09/05/16 151 lb 4 oz (68.6 kg)  07/28/16 149 lb (67.6 kg)    Physical Exam  Constitutional: She appears well-developed and well-nourished. No distress.  Psychiatric: She has a normal mood and affect. Her behavior is normal.  Depressed with discussion of life stressors  Nursing note and vitals reviewed.  Results for orders placed or performed in visit on 09/05/16  Renal function panel  Result Value Ref Range   Sodium 139 135 - 145 mEq/L   Potassium 4.4 3.5 - 5.1 mEq/L   Chloride 106 96 - 112 mEq/L   CO2 27 19 - 32 mEq/L   Calcium 9.6 8.4 - 10.5 mg/dL   Albumin 4.1 3.5 - 5.2 g/dL   BUN 15 6 - 23 mg/dL   Creatinine, Ser 1.30 (H) 0.40 - 1.20 mg/dL   Glucose, Bld 107 (H) 70 - 99 mg/dL   Phosphorus 3.9 2.3 - 4.6 mg/dL   GFR 42.48  (L) >60.00 mL/min  Hemoglobin A1c  Result Value Ref Range   Hgb A1c MFr Bld 6.7 (H) 4.6 - 6.5 %      Assessment & Plan:  Over 25 minutes were spent face-to-face with the patient during this encounter and >50% of that time was spent on counseling and coordination of care  Problem List Items Addressed This Visit    Complicated grief   MDD (major depressive disorder), recurrent episode, moderate ([place]) - Primary    Discussed ongoing struggles. Support provided.  Will change SSRI - from sertraline (on this for last 20 yrs) to celexa 20mg  daily. Continue remeron and klonopin. I also recommended counseling - pt will check with local [facility] which has available therapy services - if not able to get in there she will let us know for referral to local counselor.  RTC 1 mo f/u visit.       Relevant Medications   citalopram (CELEXA) 20 MG tablet       Follow up plan: Return in about 4 weeks (around 11/18/2016) for follow up visit.  [name], MD

## 2016-10-24 ENCOUNTER — Other Ambulatory Visit: Payer: Self-pay | Admitting: Family Medicine

## 2016-10-24 NOTE — Telephone Encounter (Signed)
Pt request refill levothyroxine to [facility] today. Pt seen 02/07/16 and was to continue current regimen. Advised pt will refill and pt will get updated refills at [facility] on 02/17/17.

## 2016-11-18 ENCOUNTER — Ambulatory Visit (INDEPENDENT_AMBULATORY_CARE_PROVIDER_SITE_OTHER): Payer: PPO | Admitting: Family Medicine

## 2016-11-18 ENCOUNTER — Encounter: Payer: Self-pay | Admitting: Family Medicine

## 2016-11-18 VITALS — BP 126/60 | HR 66 | Temp 97.6°F | Resp 16 | Ht 64.0 in | Wt 154.8 lb

## 2016-11-18 DIAGNOSIS — F331 Major depressive disorder, recurrent, moderate: Secondary | ICD-10-CM | POA: Diagnosis not present

## 2016-11-18 MED ORDER — CLONAZEPAM 0.5 MG PO TABS: 0.5000 mg | ORAL_TABLET | Freq: Two times a day (BID) | ORAL | 0 refills | Status: DC | PRN

## 2016-11-18 MED ORDER — VENLAFAXINE HCL ER 37.5 MG PO CP24: ORAL_CAPSULE | ORAL | 1 refills | Status: DC

## 2016-11-18 NOTE — Patient Instructions (Addendum)
We will refer you to new counselor.  Klonopin refilled today.  Let's cross taper from celexa onto effexor - take 1/2 tablet celexa with 1 tablet effexor 37.5mg  daily for 1 week then increase to 2 tablets of effexor daily, stop celexa.  Return in 1 month for follow up visit.  Schedule follow up with Dr Lorelei Pont for knee and hip.

## 2016-11-18 NOTE — Assessment & Plan Note (Addendum)
Support provided. Discussed ongoing stressors.  She would like referral to [facility] counselor - will place today Klonopin refilled today.  Will cross taper off SSRI and onto effexor as per instructions. Continue remeron nightly. RTC 1 mo f/u visit.  PHQ9 = 10/27

## 2016-11-18 NOTE — Progress Notes (Signed)
BP 126/60   Pulse 66   Temp 97.6 F (36.4 C) (Oral)   Resp 16   Ht 5\' 4"  (1.626 m)   Wt 154 lb 12.8 oz (70.2 kg)   LMP  (LMP Unknown)   SpO2 98%   BMI 26.57 kg/m    CC: 29mo f/u visit f/u depression Subjective:    Patient ID: Kathleen Williamson, female    DOB: 1940/10/04, 76 y.o.   MRN: [id number]  HPI: Kathleen Williamson is a 76 y.o. female presenting on 11/18/2016 for Depression   See prior note for details. Lives at [facility]. Recent 3 yr anniversary of husband's death.  Worsening depression. Last visit we changed sertraline to celexa 20mg  daily. No change with this. Ongoing hopelessness, "live in the past".   She continues remeron 15mg  nightly and klonopin 0.5mg  bid prn.   No SI/HI.   [facility] offers counseling through [facility] - she looked into this - mixed results because counselor forgot appointment. Would like referral to new counselor.   Ongoing L knee pain - asks about flexogenic clinic.   Relevant past medical, surgical, family and social history reviewed and updated as indicated. Interim medical history since our last visit reviewed. Allergies and medications reviewed and updated. Outpatient Medications Prior to Visit  Medication Sig Dispense Refill  . aspirin EC 81 MG tablet Take 81 mg by mouth at bedtime.    . Biotin 5 MG CAPS Take 1 capsule by mouth daily.    . Calcium Carbonate-Vitamin D 600-400 MG-UNIT tablet Take 1 tablet by mouth 2 (two) times daily.    . citalopram (CELEXA) 20 MG tablet Take 1 tablet (20 mg total) by mouth daily. 30 tablet 6  . Coenzyme Q10 (COQ10) 100 MG CAPS Take 1 capsule by mouth at bedtime.     Marland Kitchen levothyroxine (SYNTHROID, LEVOTHROID) 50 MCG tablet TAKE 1 TABLET EVERY DAY 90 tablet 1  . loperamide (IMODIUM A-D) 2 MG tablet Take 2 mg by mouth every morning.    . mirtazapine (REMERON) 15 MG tablet TAKE 1 TABLET BY MOUTH AT BEDTIME 30 tablet 6  . nitroGLYCERIN (NITROLINGUAL) 0.4 MG/SPRAY spray Place 1 spray under the  tongue every 5 (five) minutes as needed. 12 g prn  . pravastatin (PRAVACHOL) 80 MG tablet Take 1 tablet (80 mg total) by mouth every evening. 90 tablet 3  . Turmeric 500 MG CAPS Take 1 capsule by mouth daily.    . vitamin B-12 (CYANOCOBALAMIN) 1000 MCG tablet Take 1,000 mcg by mouth daily.    . clonazePAM (KLONOPIN) 0.5 MG tablet TAKE 1 TABLET BY MOUTH TWICE (2) DAILY AS NEEDED 60 tablet 0  . ezetimibe (ZETIA) 10 MG tablet Take 1 tablet (10 mg total) by mouth daily. 90 tablet 3   No facility-administered medications prior to visit.      Per HPI unless specifically indicated in ROS section below Review of Systems     Objective:    BP 126/60   Pulse 66   Temp 97.6 F (36.4 C) (Oral)   Resp 16   Ht 5\' 4"  (1.626 m)   Wt 154 lb 12.8 oz (70.2 kg)   LMP  (LMP Unknown)   SpO2 98%   BMI 26.57 kg/m   Wt Readings from Last 3 Encounters:  11/18/16 154 lb 12.8 oz (70.2 kg)  10/21/16 151 lb (68.5 kg)  09/05/16 151 lb 4 oz (68.6 kg)    Physical Exam  Constitutional: She appears well-developed and  well-nourished. No distress.  Psychiatric: She has a normal mood and affect.  Nursing note and vitals reviewed.     Assessment & Plan:   Problem List Items Addressed This Visit    MDD (major depressive disorder), recurrent episode, moderate ([place]) - Primary    Support provided. Discussed ongoing stressors.  She would like referral to [facility] counselor - will place today Klonopin refilled today.  Will cross taper off SSRI and onto effexor as per instructions. Continue remeron nightly. RTC 1 mo f/u visit.  PHQ9 = 10/27      Relevant Medications   venlafaxine XR (EFFEXOR XR) 37.5 MG 24 hr capsule   Other Relevant Orders   Ambulatory referral to Psychology       Follow up plan: Return in about 4 weeks (around 12/16/2016) for follow up visit.  [name], MD

## 2016-11-20 ENCOUNTER — Other Ambulatory Visit: Payer: Self-pay | Admitting: Family Medicine

## 2016-11-20 NOTE — Telephone Encounter (Signed)
Looks like this was handled at the [facility], there should be no need for refill now.  Thanks.

## 2016-11-20 NOTE — Telephone Encounter (Signed)
Dr Darnell Level pt, last filled 11/18/16... Please advise

## 2016-11-24 ENCOUNTER — Encounter: Payer: Self-pay | Admitting: Family Medicine

## 2016-11-24 ENCOUNTER — Ambulatory Visit (INDEPENDENT_AMBULATORY_CARE_PROVIDER_SITE_OTHER): Payer: PPO | Admitting: Family Medicine

## 2016-11-24 ENCOUNTER — Ambulatory Visit (INDEPENDENT_AMBULATORY_CARE_PROVIDER_SITE_OTHER)
Admission: RE | Admit: 2016-11-24 | Discharge: 2016-11-24 | Disposition: A | Discharge status: Home / Self Care | Payer: PPO | Source: Ambulatory Visit | Attending: Family Medicine | Admitting: Family Medicine

## 2016-11-24 VITALS — BP 118/76 | HR 76 | Temp 97.5°F | Resp 18 | Ht 64.0 in | Wt 147.0 lb

## 2016-11-24 DIAGNOSIS — M1611 Unilateral primary osteoarthritis, right hip: Secondary | ICD-10-CM | POA: Diagnosis not present

## 2016-11-24 DIAGNOSIS — M1712 Unilateral primary osteoarthritis, left knee: Secondary | ICD-10-CM | POA: Diagnosis not present

## 2016-11-24 DIAGNOSIS — M179 Osteoarthritis of knee, unspecified: Secondary | ICD-10-CM | POA: Diagnosis not present

## 2016-11-24 MED ORDER — HYLAN G-F 20 48 MG/6ML IX SOSY
PREFILLED_SYRINGE | Freq: Once | INTRA_ARTICULAR | Status: AC
  Administered 2016-11-24: 48 [IU] via INTRA_ARTICULAR

## 2016-11-24 NOTE — Progress Notes (Signed)
Dr. Frederico Hamman T. Shenique Childers, MD, [facility] [place], [address] Phone: [phone] Fax: [phone]  11/24/2016  Patient: Kathleen Williamson, MRN: [id number], DOB: [birth year], 76 y.o.  Primary Physician:  [name], MD   Chief Complaint  Patient presents with  . Acute Visit    knee & hip pain   Subjective:   Kathleen Williamson is a 76 y.o. very pleasant female patient who presents with the following:  R hip is still kill her with walking. Can hardly make it around the track. She had a fluoroscopic guided right sided intra-articular injection 6 months ago. She had relief of symptoms for about 3 months.  I pulled up her x-rays again and reviewed with her face-to-face, and she does have at least moderate to severe osteoarthritis of the right hip.  Wants L Synvisc injection. She's been having left knee pain for months, she has some relatively severe crepitus as well as medial knee pain.  Past Medical History, Surgical History, Social History, Family History, Problem List, Medications, and Allergies have been reviewed and updated if relevant.  Patient Active Problem List   Diagnosis Date Noted  . Complicated grief 82/42/3536  . Cat bite of right foot 09/05/2016  . Cloudy urine 05/09/2016  . Health maintenance examination 02/07/2016  . Baker's cyst of knee 08/01/2015  . Controlled type 2 diabetes mellitus with diabetic nephropathy ([place]) 02/11/2015  . Subclavian steal syndrome   . Medicare annual wellness visit, subsequent 01/31/2015  . Advanced care planning/counseling discussion 01/31/2015  . Carotid stenosis 01/31/2015  . Vertebral artery stenosis/occlusion 01/31/2015  . CKD (chronic kidney disease) stage 3, GFR 30-59 ml/min 01/21/2015  . Microscopic colitis 10/26/2014  . Osteoporosis   . Hypothyroidism   . Dermatomyositis ([place])   . MDD (major depressive disorder), recurrent episode, moderate ([place]) 01/12/2014  .  Atherosclerosis of native coronary artery of native heart without angina pectoris 12/13/2013  . Pure hypercholesterolemia 12/13/2013  . Pseudocyst of pancreas 08/29/2011    Past Medical History:  Diagnosis Date  . Arthritis    fingers  . CAD (coronary artery disease) 2003   MI s/p 5v CABG  . Carotid stenosis    [place]  . CKD (chronic kidney disease) stage 3, GFR 30-59 ml/min   . Dermatomyositis [facility])    saw Dr Estanislado Pandy, improved on its own  . History of chicken pox   . History of measles   . History of migraine none since 2003  . HLD (hyperlipidemia)   . Hypothyroidism   . MDD (major depressive disorder), recurrent episode, moderate ([place])    h/o SI after lost husband unexpectedly 2015  . Microscopic colitis 05/2014   by colonoscopy, lymphocytic, zoloft related  . Osteoporosis    DEXA 03/2013 T -3.1, DEXA 10//2016 -2.9 hip, -2.2 spine  . Pancreatic cyst    Dr Ralene Ok, no f/u needed  . Prediabetes 2014  . Subclavian steal syndrome   . Urine incontinence     Past Surgical History:  Procedure Laterality Date  . APPENDECTOMY  1997   with hysterectomy  . [place]   lower  . BREAST LUMPECTOMY Left 1977   benign  . COLONOSCOPY WITH PROPOFOL N/A 05/23/2014   microscopic colitis - Garlan Fair, MD  . CORONARY ARTERY BYPASS GRAFT  2003   x 5 v  . ESOPHAGOGASTRODUODENOSCOPY (EGD) WITH PROPOFOL N/A 05/23/2014   WNL Garlan Fair, MD  . Cherre Robins  CUFF REPAIR Right 1990's  . TOOTH EXTRACTION  yrs ago  . TOTAL ABDOMINAL HYSTERECTOMY W/ BILATERAL SALPINGOOPHORECTOMY  1997   endometriosis    Social History   Social History  . Marital status: Widowed    Spouse name: N/A  . Number of children: N/A  . Years of education: N/A   Occupational History  . Not on file.   Social History Main Topics  . Smoking status: Never Smoker  . Smokeless tobacco: Never Used  . Alcohol use No  . Drug use: No  . Sexual activity: Not on file   Other Topics Concern  . Not on  file   Social History Narrative   L[facility] at [facility].   Husband of 55 yrs died suddenly November 09, 2013.   No children, no siblings.    Close friends Bethena Roys and Ace Gins nearby, cousin in [place].   Activity: going to [employer]    Family History  Problem Relation Age of Onset  . CAD Father 86  . CAD Mother 67  . Diabetes Mother   . Rheum arthritis Mother   . Cancer Maternal Aunt        breast  . CAD Other        strong paternal side  . Stroke Neg Hx     Allergies  Allergen Reactions  . Sulfa Antibiotics Nausea Only  . Crestor [Rosuvastatin Calcium] Other (See Comments)    Leg ache    Medication list reviewed and updated in full in [facility].  GEN: No fevers, chills. Nontoxic. Primarily MSK c/o today. MSK: Detailed in the HPI GI: tolerating PO intake without difficulty Neuro: No numbness, parasthesias, or tingling associated. Otherwise the pertinent positives of the ROS are noted above.   Objective:   BP 118/76   Pulse 76   Temp (!) 97.5 F (36.4 C)   Resp 18   Ht 5\' 4"  (1.626 m)   Wt 147 lb (66.7 kg)   LMP  (LMP Unknown)   SpO2 96%   BMI 25.23 kg/m    GEN: WDWN, NAD, Non-toxic, Alert & Oriented x 3 HEENT: Atraumatic, Normocephalic.  Ears and Nose: No external deformity. EXTR: No clubbing/cyanosis/edema NEURO: Normal gait.  PSYCH: Normally interactive. Conversant. Not depressed or anxious appearing.  Calm demeanor.   Knee:  L Gait: Normal heel toe pattern ROM: 0-120 Effusion: neg Echymosis or edema: none Patellar tendon NT Painful PLICA: neg Patellar grind: + Medial and lateral patellar facet loading: negative medial and lateral joint lines: medial >> lateral ttp Mcmurray's pain Flexion-pinch pain Varus and valgus stress: stable Lachman: neg Ant and Post drawer: neg Hip abduction, IR, ER: WNL Hip flexion str: 5/5 Hip abd: 5/5 Quad: 5/5 VMO atrophy:mild Hamstring concentric and eccentric: 5/5   Radiology: No results found.  Assessment  and Plan:   Primary osteoarthritis of left knee - Plan: Hylan SOSY, DG Knee 4 Views W/Patella Left  Primary osteoarthritis of right hip  Hip arthritis is stable. She continues to have pain a lot of the time, but she is not ready for a total hip. Continue to be as active as possible, lidocaine patches or cream would be reasonable, and she is going to try this.  Left sided knee osteoarthritis, patellar crepitus and medial compartmental pain on the joint line. Check plain films today. Negative is also reasonable to do Synvisc as the patient requests.  Knee Injection: Synvisc-One, L Patient verbally consented to procedure. Risks (including infection), benefits, and alternatives explained. Sterilely  prepped with Chloraprep. Ethyl cholride used for anesthesia, then 7 cc of Lidocaine 1% used for anesthesia in the anteromedial position. Reprepped with Chloraprep.  Anteromedial approach used to inject joint without difficulty, injected with Synvisc-One, 6 mL. No complications with procedure and tolerated well.   Follow-up: prn  Future Appointments Date Time Provider [place]  12/19/2016 2:00 PM [name], MD [facility]  02/10/2017 8:15 AM Eustace Pen, LPN [facility]  02/12/2017 8:30 AM [name], MD [facility]    Meds ordered this encounter  Medications  . Hylan SOSY   There are no discontinued medications. Orders Placed This Encounter  Procedures  . DG Knee 4 Views W/Patella Left    Signed,  Keyonta Barradas T. Edlyn Rosenburg, MD   Allergies as of 11/24/2016      Reactions   Sulfa Antibiotics Nausea Only   Crestor [rosuvastatin Calcium] Other (See Comments)   Leg ache      Medication List       Accurate as of 11/24/16  2:47 PM. Always use your most recent med list.          aspirin EC 81 MG tablet Take 81 mg by mouth at bedtime.   Biotin 5 MG Caps Take 1 capsule by mouth daily.   Calcium Carbonate-Vitamin D 600-400 MG-UNIT  tablet Take 1 tablet by mouth 2 (two) times daily.   citalopram 20 MG tablet Commonly known as:  CELEXA Take 1 tablet (20 mg total) by mouth daily.   clonazePAM 0.5 MG tablet Commonly known as:  KLONOPIN Take 1 tablet (0.5 mg total) by mouth 2 (two) times daily as needed for anxiety.   CoQ10 100 MG Caps Take 1 capsule by mouth at bedtime.   ezetimibe 10 MG tablet Commonly known as:  ZETIA Take 1 tablet (10 mg total) by mouth daily.   levothyroxine 50 MCG tablet Commonly known as:  SYNTHROID, LEVOTHROID TAKE 1 TABLET EVERY DAY   loperamide 2 MG tablet Commonly known as:  IMODIUM A-D Take 2 mg by mouth every morning.   mirtazapine 15 MG tablet Commonly known as:  REMERON TAKE 1 TABLET BY MOUTH AT BEDTIME   nitroGLYCERIN 0.4 MG/SPRAY spray Commonly known as:  NITROLINGUAL Place 1 spray under the tongue every 5 (five) minutes as needed.   pravastatin 80 MG tablet Commonly known as:  PRAVACHOL Take 1 tablet (80 mg total) by mouth every evening.   Turmeric 500 MG Caps Take 1 capsule by mouth daily.   venlafaxine XR 37.5 MG 24 hr capsule Commonly known as:  EFFEXOR XR Take 1 tablet daily for 1 week then increase to 2 tablets daily   vitamin B-12 1000 MCG tablet Commonly known as:  CYANOCOBALAMIN Take 1,000 mcg by mouth daily.

## 2016-12-18 ENCOUNTER — Other Ambulatory Visit: Payer: Self-pay | Admitting: Family Medicine

## 2016-12-18 NOTE — Telephone Encounter (Signed)
plz phone in. 

## 2016-12-18 NOTE — Telephone Encounter (Signed)
Rx called in to requested pharmacy 

## 2016-12-19 ENCOUNTER — Encounter: Payer: Self-pay | Admitting: Family Medicine

## 2016-12-19 ENCOUNTER — Ambulatory Visit (INDEPENDENT_AMBULATORY_CARE_PROVIDER_SITE_OTHER): Payer: PPO | Admitting: Family Medicine

## 2016-12-19 VITALS — BP 106/60 | HR 73 | Temp 97.9°F | Wt 154.0 lb

## 2016-12-19 DIAGNOSIS — M150 Primary generalized (osteo)arthritis: Secondary | ICD-10-CM | POA: Diagnosis not present

## 2016-12-19 DIAGNOSIS — M1990 Unspecified osteoarthritis, unspecified site: Secondary | ICD-10-CM | POA: Insufficient documentation

## 2016-12-19 DIAGNOSIS — Z23 Encounter for immunization: Secondary | ICD-10-CM | POA: Diagnosis not present

## 2016-12-19 DIAGNOSIS — F331 Major depressive disorder, recurrent, moderate: Secondary | ICD-10-CM

## 2016-12-19 DIAGNOSIS — Z79899 Other long term (current) drug therapy: Secondary | ICD-10-CM | POA: Diagnosis not present

## 2016-12-19 DIAGNOSIS — M159 Polyosteoarthritis, unspecified: Secondary | ICD-10-CM

## 2016-12-19 MED ORDER — VENLAFAXINE HCL ER 75 MG PO CP24: 75.0000 mg | ORAL_CAPSULE | Freq: Every day | ORAL | 6 refills | Status: DC

## 2016-12-19 NOTE — Addendum Note (Signed)
Addended by: Brenton Grills on: 10/15/5327 92:42 PM   Modules accepted: Orders

## 2016-12-19 NOTE — Assessment & Plan Note (Signed)
Appreciate recent synvisc by Dr Lorelei Pont to L knee. She has also started glucosamine/chondroitin

## 2016-12-19 NOTE — Assessment & Plan Note (Signed)
Improvement with addition of effexor 75mg  daily.  Continue this along with klonopin 0.5mg  bid prn, remeron 15mg  nightly.  She has upcoming appt with Bamb[facility] our counselor next month.  PQH9 = 10 --> 7/27

## 2016-12-19 NOTE — Progress Notes (Signed)
BP 106/60 (BP Location: Left Arm, Patient Position: Sitting, Cuff Size: Normal)   Pulse 73   Temp 97.9 F (36.6 C) (Oral)   Wt 154 lb (69.9 kg)   LMP  (LMP Unknown)   SpO2 95%   BMI 26.43 kg/m    CC: 1 mo f/u visit Subjective:    Patient ID: Kathleen Williamson, female    DOB: 1940/12/07, 76 y.o.   MRN: [id number]  HPI: Kathleen Williamson is a 76 y.o. female presenting on 12/19/2016 for 1 mo follow-up   Last visit we cross tapered off SSRI and onto effexor. This has significantly helped. We also referred to [facility] counselor (she has appt Oct 1st with Bambi). She continues remeron 15mg  nightly and klonopin 0.5mg  bid prn. Here for f/u. Denies SI/HI.   She has recently received synvisc injections ([place]). She has started using OTC move free pill (with glucosamine and chondroitin) which has helped.   In process of changing apartments. This has been stressful.   Relevant past medical, surgical, family and social history reviewed and updated as indicated. Interim medical history since our last visit reviewed. Allergies and medications reviewed and updated. Outpatient Medications Prior to Visit  Medication Sig Dispense Refill  . aspirin EC 81 MG tablet Take 81 mg by mouth at bedtime.    . Biotin 5 MG CAPS Take 1 capsule by mouth daily.    . Calcium Carbonate-Vitamin D 600-400 MG-UNIT tablet Take 1 tablet by mouth 2 (two) times daily.    . citalopram (CELEXA) 20 MG tablet Take 1 tablet (20 mg total) by mouth daily. 30 tablet 6  . clonazePAM (KLONOPIN) 0.5 MG tablet TAKE 1 TABLET BY MOUTH TWICE (2) DAILY AS NEEDED 60 tablet 0  . Coenzyme Q10 (COQ10) 100 MG CAPS Take 1 capsule by mouth at bedtime.     Marland Kitchen levothyroxine (SYNTHROID, LEVOTHROID) 50 MCG tablet TAKE 1 TABLET EVERY DAY 90 tablet 1  . loperamide (IMODIUM A-D) 2 MG tablet Take 2 mg by mouth every morning.    . mirtazapine (REMERON) 15 MG tablet TAKE 1 TABLET BY MOUTH AT BEDTIME 30 tablet 6  . nitroGLYCERIN (NITROLINGUAL) 0.4 MG/SPRAY spray  Place 1 spray under the tongue every 5 (five) minutes as needed. 12 g prn  . pravastatin (PRAVACHOL) 80 MG tablet Take 1 tablet (80 mg total) by mouth every evening. 90 tablet 3  . vitamin B-12 (CYANOCOBALAMIN) 1000 MCG tablet Take 1,000 mcg by mouth daily.    . Turmeric 500 MG CAPS Take 1 capsule by mouth daily.    Marland Kitchen venlafaxine XR (EFFEXOR XR) 37.5 MG 24 hr capsule Take 1 tablet daily for 1 week then increase to 2 tablets daily 60 capsule 1  . ezetimibe (ZETIA) 10 MG tablet Take 1 tablet (10 mg total) by mouth daily. 90 tablet 3   No facility-administered medications prior to visit.      Per HPI unless specifically indicated in ROS section below Review of Systems     Objective:    BP 106/60 (BP Location: Left Arm, Patient Position: Sitting, Cuff Size: Normal)   Pulse 73   Temp 97.9 F (36.6 C) (Oral)   Wt 154 lb (69.9 kg)   LMP  (LMP Unknown)   SpO2 95%   BMI 26.43 kg/m   Wt Readings from Last 3 Encounters:  12/19/16 154 lb (69.9 kg)  11/24/16 147 lb (66.7 kg)  11/18/16 154 lb 12.8 oz (70.2 kg)    Physical Exam  Constitutional:  She appears well-developed and well-nourished. No distress.  Psychiatric: She has a normal mood and affect. Her behavior is normal. Thought content normal.  Nursing note and vitals reviewed.  Results for orders placed or performed in visit on 09/05/16  Renal function panel  Result Value Ref Range   Sodium 139 135 - 145 mEq/L   Potassium 4.4 3.5 - 5.1 mEq/L   Chloride 106 96 - 112 mEq/L   CO2 27 19 - 32 mEq/L   Calcium 9.6 8.4 - 10.5 mg/dL   Albumin 4.1 3.5 - 5.2 g/dL   BUN 15 6 - 23 mg/dL   Creatinine, Ser 1.30 (H) 0.40 - 1.20 mg/dL   Glucose, Bld 107 (H) 70 - 99 mg/dL   Phosphorus 3.9 2.3 - 4.6 mg/dL   GFR 42.48 (L) >60.00 mL/min  Hemoglobin A1c  Result Value Ref Range   Hgb A1c MFr Bld 6.7 (H) 4.6 - 6.5 %      Assessment & Plan:   Problem List Items Addressed This Visit    MDD (major depressive disorder), recurrent episode, moderate  ([place]) - Primary    Improvement with addition of effexor 75mg  daily.  Continue this along with klonopin 0.5mg  bid prn, remeron 15mg  nightly.  She has upcoming appt with Bambi our counselor next month.  PQH9 = 10 --> 7/27      Relevant Medications   venlafaxine XR (EFFEXOR-XR) 75 MG 24 hr capsule   Osteoarthritis    Appreciate recent synvisc by Dr Lorelei Pont to L knee. She has also started glucosamine/chondroitin          Follow up plan: No Follow-up on file.  [name], MD

## 2016-12-19 NOTE — Patient Instructions (Addendum)
Flu shot today. Update UDS today.  I'm glad effexor is helping - continue 75mg  daily.  Keep follow up appointment in November.

## 2017-01-12 ENCOUNTER — Ambulatory Visit: Payer: Self-pay | Admitting: Psychology

## 2017-01-15 ENCOUNTER — Other Ambulatory Visit: Payer: Self-pay | Admitting: Family Medicine

## 2017-01-19 ENCOUNTER — Other Ambulatory Visit: Payer: Self-pay | Admitting: Family Medicine

## 2017-01-19 NOTE — Telephone Encounter (Signed)
Last filled: 12/18/16, #60 Last OV:  12/19/16 Next OV:  02/17/17

## 2017-01-20 NOTE — Telephone Encounter (Signed)
plz phone in. 

## 2017-01-20 NOTE — Telephone Encounter (Signed)
Refill left on vm at pharmacy.  

## 2017-02-10 ENCOUNTER — Ambulatory Visit (INDEPENDENT_AMBULATORY_CARE_PROVIDER_SITE_OTHER): Payer: PPO

## 2017-02-10 ENCOUNTER — Other Ambulatory Visit: Payer: Self-pay | Admitting: Family Medicine

## 2017-02-10 VITALS — BP 118/70 | HR 58 | Temp 97.7°F | Ht 62.75 in | Wt 152.5 lb

## 2017-02-10 DIAGNOSIS — E039 Hypothyroidism, unspecified: Secondary | ICD-10-CM

## 2017-02-10 DIAGNOSIS — E1121 Type 2 diabetes mellitus with diabetic nephropathy: Secondary | ICD-10-CM

## 2017-02-10 DIAGNOSIS — M810 Age-related osteoporosis without current pathological fracture: Secondary | ICD-10-CM

## 2017-02-10 DIAGNOSIS — E7800 Pure hypercholesterolemia, unspecified: Secondary | ICD-10-CM

## 2017-02-10 DIAGNOSIS — Z0000 Encounter for general adult medical examination without abnormal findings: Secondary | ICD-10-CM | POA: Diagnosis not present

## 2017-02-10 LAB — COMPREHENSIVE METABOLIC PANEL
ALK PHOS: 59 U/L (ref 39–117)
ALT: 11 U/L (ref 0–35)
AST: 14 U/L (ref 0–37)
Albumin: 4.3 g/dL (ref 3.5–5.2)
BUN: 14 mg/dL (ref 6–23)
CO2: 26 mEq/L (ref 19–32)
CREATININE: 1.37 mg/dL — AB (ref 0.40–1.20)
Calcium: 10.1 mg/dL (ref 8.4–10.5)
Chloride: 108 mEq/L (ref 96–112)
GFR: 39.93 mL/min — ABNORMAL LOW (ref >60.00)
GLUCOSE: 140 mg/dL — AB (ref 70–99)
POTASSIUM: 5.3 meq/L — AB (ref 3.5–5.1)
Sodium: 141 mEq/L (ref 135–145)
TOTAL PROTEIN: 7 g/dL (ref 6.0–8.3)
Total Bilirubin: 0.5 mg/dL (ref 0.2–1.2)

## 2017-02-10 LAB — MICROALBUMIN / CREATININE URINE RATIO
Creatinine,U: 60.9 mg/dL
Microalb Creat Ratio: 1.2 mg/g (ref 0.0–30.0)
Microalb, Ur: <0.7 mg/dL (ref 0.0–1.9)

## 2017-02-10 LAB — TSH: TSH: 2.58 u[IU]/mL (ref 0.35–4.50)

## 2017-02-10 LAB — LIPID PANEL
Cholesterol: 138 mg/dL (ref 0–200)
HDL: 36.8 mg/dL — ABNORMAL LOW (ref >39.00)
LDL CALC: 62 mg/dL (ref 0–99)
NONHDL: 101.18
Total CHOL/HDL Ratio: 4
Triglycerides: 196 mg/dL — ABNORMAL HIGH (ref 0.0–149.0)
VLDL: 39.2 mg/dL (ref 0.0–40.0)

## 2017-02-10 LAB — HEMOGLOBIN A1C: Hgb A1c MFr Bld: 6.8 % — ABNORMAL HIGH (ref 4.6–6.5)

## 2017-02-10 LAB — VITAMIN D 25 HYDROXY (VIT D DEFICIENCY, FRACTURES): VITD: 43.46 ng/mL (ref 30.00–100.00)

## 2017-02-10 NOTE — Patient Instructions (Signed)
Kathleen Williamson , Thank you for taking time to come for your Medicare Wellness Visit. I appreciate your ongoing commitment to your health goals. Please review the following plan we discussed and let me know if I can assist you in the future.   These are the goals we discussed: Goals    . Increase physical activity          Starting 02/10/2017, I will continue to walk at least 30 min daily as weather permits.        This is a list of the screening recommended for you and due dates:  Health Maintenance  Topic Date Due  . DTaP/Tdap/Td vaccine (1 - Tdap) 09/06/2026*  . Eye exam for diabetics  03/04/2017  . Mammogram  03/17/2017  . Complete foot exam   05/09/2017  . Hemoglobin A1C  08/11/2017  . Urine Protein Check  02/10/2018  . Colon Cancer Screening  05/23/2024  . Tetanus Vaccine  09/06/2026  . Flu Shot  Completed  . DEXA scan (bone density measurement)  Completed  . Pneumonia vaccines  Completed  *Topic was postponed. The date shown is not the original due date.   Preventive Care for Adults  A healthy lifestyle and preventive care can promote health and wellness. Preventive health guidelines for adults include the following key practices.  . A routine yearly physical is a good way to check with your health care provider about your health and preventive screening. It is a chance to share any concerns and updates on your health and to receive a thorough exam.  . Visit your dentist for a routine exam and preventive care every 6 months. Brush your teeth twice a day and floss once a day. Good oral hygiene prevents tooth decay and gum disease.  . The frequency of eye exams is based on your age, health, family medical history, use  of contact lenses, and other factors. Follow your health care provider's recommendations for frequency of eye exams.  . Eat a healthy diet. Foods like vegetables, fruits, whole grains, low-fat dairy products, and lean protein foods contain the nutrients you need  without too many calories. Decrease your intake of foods high in solid fats, added sugars, and salt. Eat the right amount of calories for you. Get information about a proper diet from your health care provider, if necessary.  . Regular physical exercise is one of the most important things you can do for your health. Most adults should get at least 150 minutes of moderate-intensity exercise (any activity that increases your heart rate and causes you to sweat) each week. In addition, most adults need muscle-strengthening exercises on 2 or more days a week.  Silver Sneakers may be a benefit available to you. To determine eligibility, you may visit the website: www.silversneakers.com or contact program at [phone]) [phone] Mon-Fri between 8AM-8PM.   . Maintain a healthy weight. The body mass index (BMI) is a screening tool to identify possible weight problems. It provides an estimate of body fat based on height and weight. Your health care provider can find your BMI and can help you achieve or maintain a healthy weight.   For adults 20 years and older: ? A BMI below 18.5 is considered underweight. ? A BMI of 18.5 to 24.9 is normal. ? A BMI of 25 to 29.9 is considered overweight. ? A BMI of 30 and above is considered obese.   . Maintain normal blood lipids and cholesterol levels by exercising and minimizing your intake of saturated  fat. Eat a balanced diet with plenty of fruit and vegetables. Blood tests for lipids and cholesterol should begin at age 43 and be repeated every 5 years. If your lipid or cholesterol levels are high, you are over 50, or you are at high risk for heart disease, you may need your cholesterol levels checked more frequently. Ongoing high lipid and cholesterol levels should be treated with medicines if diet and exercise are not working.  . If you smoke, find out from your health care provider how to quit. If you do not use tobacco, please do not start.  . If you choose to drink  alcohol, please do not consume more than 2 drinks per day. One drink is considered to be 12 ounces (355 mL) of beer, 5 ounces (148 mL) of wine, or 1.5 ounces (44 mL) of liquor.  . If you are 29-49 years old, ask your health care provider if you should take aspirin to prevent strokes.  . Use sunscreen. Apply sunscreen liberally and repeatedly throughout the day. You should seek shade when your shadow is shorter than you. Protect yourself by wearing long sleeves, pants, a wide-brimmed hat, and sunglasses year round, whenever you are outdoors.  . Once a month, do a whole body skin exam, using a mirror to look at the skin on your back. Tell your health care provider of new moles, moles that have irregular borders, moles that are larger than a pencil eraser, or moles that have changed in shape or color.

## 2017-02-10 NOTE — Progress Notes (Signed)
Subjective:   Kathleen Williamson is a 76 y.o. female who presents for Medicare Annual (Subsequent) preventive examination.  Review of Systems:  N/A Cardiac Risk Factors include: advanced age (>63men, >76 women);dyslipidemia;diabetes mellitus     Objective:     Vitals: BP 118/70 (BP Location: Right Arm, Patient Position: Sitting, Cuff Size: Normal)   Pulse (!) 58   Temp 97.7 F (36.5 C) (Oral)   Ht 5' 2.75" (1.594 m) Comment: no shoes  Wt 152 lb 8 oz (69.2 kg)   LMP  (LMP Unknown)   SpO2 98%   BMI 27.23 kg/m   Body mass index is 27.23 kg/m.   Tobacco History  Smoking Status  . Never Smoker  Smokeless Tobacco  . Never Used     Counseling given: No   Past Medical History:  Diagnosis Date  . Arthritis    fingers  . CAD (coronary artery disease) 2003   MI s/p 5v CABG  . Carotid stenosis    [place]  . CKD (chronic kidney disease) stage 3, GFR 30-59 ml/min (HCC)   . Dermatomyositis [facility])    saw Dr Estanislado Pandy, improved on its own  . History of chicken pox   . History of measles   . History of migraine none since 2003  . HLD (hyperlipidemia)   . Hypothyroidism   . MDD (major depressive disorder), recurrent episode, moderate ([place])    h/o SI after lost husband unexpectedly 2015  . Microscopic colitis 05/2014   by colonoscopy, lymphocytic, zoloft related  . Osteoporosis    DEXA 03/2013 T -3.1, DEXA 10//2016 -2.9 hip, -2.2 spine  . Pancreatic cyst    Dr Ralene Ok, no f/u needed  . Prediabetes 2014  . Subclavian steal syndrome   . Urine incontinence    Past Surgical History:  Procedure Laterality Date  . APPENDECTOMY  1997   with hysterectomy  . [place]   lower  . BREAST LUMPECTOMY Left 1977   benign  . COLONOSCOPY WITH PROPOFOL N/A 05/23/2014   microscopic colitis - Garlan Fair, MD  . CORONARY ARTERY BYPASS GRAFT  2003   x 5 v  . ESOPHAGOGASTRODUODENOSCOPY (EGD) WITH PROPOFOL N/A 05/23/2014   WNL Garlan Fair, MD  . [place] Right  1990's  . TOOTH EXTRACTION  yrs ago  . TOTAL ABDOMINAL HYSTERECTOMY W/ BILATERAL SALPINGOOPHORECTOMY  1997   endometriosis   Family History  Problem Relation Age of Onset  . CAD Father 49  . CAD Mother 71  . Diabetes Mother   . Rheum arthritis Mother   . Cancer Maternal Aunt        breast  . CAD Other        strong paternal side  . Stroke Neg Hx    History  Sexual Activity  . Sexual activity: Not on file    Outpatient Encounter Prescriptions as of 02/10/2017  Medication Sig  . aspirin EC 81 MG tablet Take 81 mg by mouth at bedtime.  . Biotin 5 MG CAPS Take 1 capsule by mouth daily.  . Calcium Carbonate-Vitamin D 600-400 MG-UNIT tablet Take 1 tablet by mouth 2 (two) times daily.  . citalopram (CELEXA) 20 MG tablet Take 1 tablet (20 mg total) by mouth daily.  . clonazePAM (KLONOPIN) 0.5 MG tablet TAKE 1 TABLET BY MOUTH TWICE (2) DAILY AS NEEDED  . Coenzyme Q10 (COQ10) 100 MG CAPS Take 1 capsule by mouth at bedtime.   . Glucosamine-Chondroitin (MOVE FREE PO)  Take 2 tablets by mouth daily.  Marland Kitchen levothyroxine (SYNTHROID, LEVOTHROID) 50 MCG tablet TAKE 1 TABLET EVERY DAY  . loperamide (IMODIUM A-D) 2 MG tablet Take 2 mg by mouth every morning.  . mirtazapine (REMERON) 15 MG tablet TAKE 1 TABLET BY MOUTH AT BEDTIME  . nitroGLYCERIN (NITROLINGUAL) 0.4 MG/SPRAY spray Place 1 spray under the tongue every 5 (five) minutes as needed.  . pravastatin (PRAVACHOL) 80 MG tablet Take 1 tablet (80 mg total) by mouth every evening.  . venlafaxine XR (EFFEXOR-XR) 37.5 MG 24 hr capsule TAKE 1 CAPSULE DAILY FOR 1 WEEK THEN INCREASE TO 2 CAPSULES DAILY  . venlafaxine XR (EFFEXOR-XR) 75 MG 24 hr capsule Take 1 capsule (75 mg total) by mouth daily with breakfast.  . vitamin B-12 (CYANOCOBALAMIN) 1000 MCG tablet Take 1,000 mcg by mouth daily.  Marland Kitchen ezetimibe (ZETIA) 10 MG tablet Take 1 tablet (10 mg total) by mouth daily.   No facility-administered encounter medications on file as of 02/10/2017.      Activities of Daily Living In your present state of health, do you have any difficulty performing the following activities: 02/10/2017  Hearing? N  Vision? N  Difficulty concentrating or making decisions? N  Walking or climbing stairs? N  Dressing or bathing? N  Doing errands, shopping? N  Preparing Food and eating ? N  Using the Toilet? N  In the past six months, have you accidently leaked urine? Y  Do you have problems with loss of bowel control? N  Managing your Medications? N  Managing your Finances? N  Housekeeping or managing your Housekeeping? N  Some recent data might be hidden    Patient Care Team: [name], MD as PCP - General (Family Medicine)    Assessment:     Hearing Screening   125Hz  250Hz  500Hz  1000Hz  2000Hz  3000Hz  4000Hz  6000Hz  8000Hz   Right ear:   40 40 40  40    Left ear:   40 40 40  40    Vision Screening Comments: Last vision exam in November 2017 with Dr. Sandra Cockayne   Exercise Activities and Dietary recommendations Current Exercise Habits: Home exercise routine, Type of exercise: walking, Time (Minutes): 30, Frequency (Times/Week): 7, Weekly Exercise (Minutes/Week): 210, Intensity: Mild, Exercise limited by: None identified  Goals    . Increase physical activity          Starting 02/10/2017, I will continue to walk at least 30 min daily as weather permits.       Fall Risk Fall Risk  02/10/2017 02/04/2016 01/31/2015  Falls in the past year? No No No   Depression Screen PHQ 2/9 Scores 02/10/2017 12/19/2016 11/18/2016 10/21/2016  PHQ - 2 Score 6 2 4 5   PHQ- 9 Score 8 7 10 15      Cognitive Function MMSE - Mini Mental State Exam 02/10/2017 02/04/2016  Orientation to time 5 5  Orientation to Place 5 5  Registration 3 3  Attention/ Calculation 0 0  Recall 3 3  Language- name 2 objects 0 0  Language- repeat 1 1  Language- follow 3 step command 3 3  Language- read & follow direction 0 0  Write a sentence 0 0  Copy design 0 0  Total  score 20 20       PLEASE NOTE: A Mini-Cog screen was completed. Maximum score is 20. A value of 0 denotes this part of [place] MMSE was not completed or the patient failed this part of the Mini-Cog screening.   Mini-Cog  Screening Orientation to Time - Max 5 pts Orientation to Place - Max 5 pts Registration - Max 3 pts Recall - Max 3 pts Language Repeat - Max 1 pts Language Follow 3 Step Command - Max 3 pts   Immunization History  Administered Date(s) Administered  . Influenza,inj,Quad PF,6+ Mos 01/22/2015, 02/04/2016, 12/19/2016  . Influenza-Unspecified 04/01/2011  . Pneumococcal Conjugate-13 02/04/2016  . Pneumococcal-Unspecified 03/24/2007  . Td 03/24/2007, 09/05/2016  . Zoster 04/14/2009   Screening Tests Health Maintenance  Topic Date Due  . DTaP/Tdap/Td (1 - Tdap) 09/06/2026 (Originally 09/06/2016)  . OPHTHALMOLOGY EXAM  03/04/2017  . MAMMOGRAM  03/17/2017  . FOOT EXAM  05/09/2017  . HEMOGLOBIN A1C  08/11/2017  . URINE MICROALBUMIN  02/10/2018  . COLONOSCOPY  05/23/2024  . TETANUS/TDAP  09/06/2026  . INFLUENZA VACCINE  Completed  . DEXA SCAN  Completed  . PNA vac Low Risk Adult  Completed      Plan:   I have personally reviewed, addressed, and noted the following in the patient's chart:  A. Medical and social history B. Use of alcohol, tobacco or illicit drugs  C. Current medications and supplements D. Functional ability and status E.  Nutritional status F.  Physical activity G. Advance directives H. List of other physicians I.  Hospitalizations, surgeries, and ER visits in previous 12 months J.  [place] to include hearing, vision, cognitive, depression L. Referrals and appointments - none  In addition, I have reviewed and discussed with patient certain preventive protocols, quality metrics, and best practice recommendations. A written personalized care plan for preventive services as well as general preventive health recommendations were  provided to patient.  See attached scanned questionnaire for additional information.   Signed,   Lindell Noe, MHA, BS, LPN Health Coach

## 2017-02-10 NOTE — Progress Notes (Signed)
Pre visit review using our clinic review tool, if applicable. No additional management support is needed unless otherwise documented below in the visit note. 

## 2017-02-10 NOTE — Progress Notes (Signed)
PCP notes:   Health maintenance:  Microalbumin - completed A1C - completed  Abnormal screenings:   Depression score: 8  Patient concerns:   None  Nurse concerns:  None  Next PCP appt:   02/12/17 @ 0830

## 2017-02-12 ENCOUNTER — Encounter: Payer: Self-pay | Admitting: Family Medicine

## 2017-02-12 ENCOUNTER — Ambulatory Visit (INDEPENDENT_AMBULATORY_CARE_PROVIDER_SITE_OTHER): Payer: PPO | Admitting: Family Medicine

## 2017-02-12 VITALS — BP 120/60 | HR 74 | Temp 97.8°F | Ht 64.0 in | Wt 154.0 lb

## 2017-02-12 DIAGNOSIS — E039 Hypothyroidism, unspecified: Secondary | ICD-10-CM

## 2017-02-12 DIAGNOSIS — M810 Age-related osteoporosis without current pathological fracture: Secondary | ICD-10-CM | POA: Diagnosis not present

## 2017-02-12 DIAGNOSIS — F331 Major depressive disorder, recurrent, moderate: Secondary | ICD-10-CM

## 2017-02-12 DIAGNOSIS — Z0000 Encounter for general adult medical examination without abnormal findings: Secondary | ICD-10-CM | POA: Diagnosis not present

## 2017-02-12 DIAGNOSIS — K148 Other diseases of tongue: Secondary | ICD-10-CM | POA: Diagnosis not present

## 2017-02-12 DIAGNOSIS — M159 Polyosteoarthritis, unspecified: Secondary | ICD-10-CM

## 2017-02-12 DIAGNOSIS — M3390 Dermatopolymyositis, unspecified, organ involvement unspecified: Secondary | ICD-10-CM | POA: Diagnosis not present

## 2017-02-12 DIAGNOSIS — N183 Chronic kidney disease, stage 3 unspecified: Secondary | ICD-10-CM

## 2017-02-12 DIAGNOSIS — M3313 Other dermatomyositis without myopathy: Secondary | ICD-10-CM

## 2017-02-12 DIAGNOSIS — E7800 Pure hypercholesterolemia, unspecified: Secondary | ICD-10-CM

## 2017-02-12 DIAGNOSIS — E1121 Type 2 diabetes mellitus with diabetic nephropathy: Secondary | ICD-10-CM | POA: Diagnosis not present

## 2017-02-12 DIAGNOSIS — Z7189 Other specified counseling: Secondary | ICD-10-CM

## 2017-02-12 DIAGNOSIS — M150 Primary generalized (osteo)arthritis: Secondary | ICD-10-CM | POA: Diagnosis not present

## 2017-02-12 NOTE — Assessment & Plan Note (Signed)
Encouraged good hydration status.  

## 2017-02-12 NOTE — Assessment & Plan Note (Signed)
rec oral surgeon eval. Pt prefers to start with her dentist - she will call to schedule sooner appointment.

## 2017-02-12 NOTE — Assessment & Plan Note (Signed)
Symptoms have improved with MoveFree

## 2017-02-12 NOTE — Assessment & Plan Note (Signed)
Chronic, stable. Continue current regimen of pravastatin and zetia. The 10-year ASCVD risk score Mikey Bussing DC Brooke Bonito., et al., 2013) is: 24.9%   Values used to calculate the score:     Age: 76 years     Sex: Female     Is Non-Hispanic African American: No     Diabetic: Yes     Tobacco smoker: No     Systolic Blood Pressure: 563 mmHg     Is BP treated: No     HDL Cholesterol: 36.8 mg/dL     Total Cholesterol: 138 mg/dL

## 2017-02-12 NOTE — Assessment & Plan Note (Signed)
Preventative protocols reviewed and updated unless pt declined. Discussed healthy diet and lifestyle.  

## 2017-02-12 NOTE — Assessment & Plan Note (Signed)
Quiescent

## 2017-02-12 NOTE — Assessment & Plan Note (Addendum)
Advanced directive discussion - has completed through attorney. Will bring us copy. Very difficult to complete since husband's passing. Thinking of getting [facility] involved.  

## 2017-02-12 NOTE — Patient Instructions (Addendum)
We will repeat bone density scan and I will have you look into prolia Q6 month injection. We will look into prolia for you.  If interested, check with pharmacy about new 2 shot shingles series (shingrix).  Work on diabetes diet - handout provided today. We will refer you to diabetes education classes.  Call to schedule sooner appointment with dentist Good to see you today, call us with questions. Return as needed or in 6 months for follow up visit.   Health Maintenance, Female Adopting a healthy lifestyle and getting preventive care can go a long way to promote health and wellness. Talk with your health care provider about what schedule of regular examinations is right for you. This is a good chance for you to check in with your provider about disease prevention and staying healthy. In between checkups, there are plenty of things you can do on your own. Experts have done a lot of research about which lifestyle changes and preventive measures are most likely to keep you healthy. Ask your health care provider for more information. Weight and diet Eat a healthy diet  Be sure to include plenty of vegetables, fruits, low-fat dairy products, and lean protein.  Do not eat a lot of foods high in solid fats, added sugars, or salt.  Get regular exercise. This is one of the most important things you can do for your health. ? Most adults should exercise for at least 150 minutes each week. The exercise should increase your heart rate and make you sweat (moderate-intensity exercise). ? Most adults should also do strengthening exercises at least twice a week. This is in addition to the moderate-intensity exercise.  Maintain a healthy weight  Body mass index (BMI) is a measurement that can be used to identify possible weight problems. It estimates body fat based on height and weight. Your health care provider can help determine your BMI and help you achieve or maintain a healthy weight.  For females 61  years of age and older: ? A BMI below 18.5 is considered underweight. ? A BMI of 18.5 to 24.9 is normal. ? A BMI of 25 to 29.9 is considered overweight. ? A BMI of 30 and above is considered obese.  Watch levels of cholesterol and blood lipids  You should start having your blood tested for lipids and cholesterol at 75 years of age, then have this test every 5 years.  You may need to have your cholesterol levels checked more often if: ? Your lipid or cholesterol levels are high. ? You are older than 75 years of age. ? You are at high risk for heart disease.  Cancer screening Lung Cancer  Lung cancer screening is recommended for adults 54-28 years old who are at high risk for lung cancer because of a history of smoking.  A yearly low-dose CT scan of the lungs is recommended for people who: ? Currently smoke. ? Have quit within the past 15 years. ? Have at least a 30-pack-year history of smoking. A pack year is smoking an average of one pack of cigarettes a day for 1 year.  Yearly screening should continue until it has been 15 years since you quit.  Yearly screening should stop if you develop a health problem that would prevent you from having lung cancer treatment.  Breast Cancer  Practice breast self-awareness. This means understanding how your breasts normally appear and feel.  It also means doing regular breast self-exams. Let your health care provider know about any  changes, no matter how small.  If you are in your 20s or 30s, you should have a clinical breast exam (CBE) by a health care provider every 1-3 years as part of a regular health exam.  If you are 40 or older, have a CBE every year. Also consider having a breast X-ray (mammogram) every year.  If you have a family history of breast cancer, talk to your health care provider about genetic screening.  If you are at high risk for breast cancer, talk to your health care provider about having an MRI and a mammogram every  year.  Breast cancer gene (BRCA) assessment is recommended for women who have family members with BRCA-related cancers. BRCA-related cancers include: ? Breast. ? Ovarian. ? Tubal. ? Peritoneal cancers.  Results of the assessment will determine the need for genetic counseling and BRCA1 and BRCA2 testing.  Cervical Cancer Your health care provider may recommend that you be screened regularly for cancer of the pelvic organs (ovaries, uterus, and vagina). This screening involves a pelvic examination, including checking for microscopic changes to the surface of your cervix (Pap test). You may be encouraged to have this screening done every 3 years, beginning at age 21.  For women ages 30-65, health care providers may recommend pelvic exams and Pap testing every 3 years, or they may recommend the Pap and pelvic exam, combined with testing for human papilloma virus (HPV), every 5 years. Some types of HPV increase your risk of cervical cancer. Testing for HPV may also be done on women of any age with unclear Pap test results.  Other health care providers may not recommend any screening for nonpregnant women who are considered low risk for pelvic cancer and who do not have symptoms. Ask your health care provider if a screening pelvic exam is right for you.  If you have had past treatment for cervical cancer or a condition that could lead to cancer, you need Pap tests and screening for cancer for at least 20 years after your treatment. If Pap tests have been discontinued, your risk factors (such as having a new sexual partner) need to be reassessed to determine if screening should resume. Some women have medical problems that increase the chance of getting cervical cancer. In these cases, your health care provider may recommend more frequent screening and Pap tests.  Colorectal Cancer  This type of cancer can be detected and often prevented.  Routine colorectal cancer screening usually begins at 75  years of age and continues through 75 years of age.  Your health care provider may recommend screening at an earlier age if you have risk factors for colon cancer.  Your health care provider may also recommend using home test kits to check for hidden blood in the stool.  A small camera at the end of a tube can be used to examine your colon directly (sigmoidoscopy or colonoscopy). This is done to check for the earliest forms of colorectal cancer.  Routine screening usually begins at age 50.  Direct examination of the colon should be repeated every 5-10 years through 75 years of age. However, you may need to be screened more often if early forms of precancerous polyps or small growths are found.  Skin Cancer  Check your skin from head to toe regularly.  Tell your health care provider about any new moles or changes in moles, especially if there is a change in a mole's shape or color.  Also tell your health care provider if   you have a mole that is larger than the size of a pencil eraser.  Always use sunscreen. Apply sunscreen liberally and repeatedly throughout the day.  Protect yourself by wearing long sleeves, pants, a wide-brimmed hat, and sunglasses whenever you are outside.  Heart disease, diabetes, and high blood pressure  High blood pressure causes heart disease and increases the risk of stroke. High blood pressure is more likely to develop in: ? People who have blood pressure in the high end of the normal range (130-139/85-89 mm Hg). ? People who are overweight or obese. ? People who are African American.  If you are 78-75 years of age, have your blood pressure checked every 3-5 years. If you are 2 years of age or older, have your blood pressure checked every year. You should have your blood pressure measured twice-once when you are at a hospital or clinic, and once when you are not at a hospital or clinic. Record the average of the two measurements. To check your blood pressure  when you are not at a hospital or clinic, you can use: ? An automated blood pressure machine at a pharmacy. ? A home blood pressure monitor.  If you are between 32 years and 97 years old, ask your health care provider if you should take aspirin to prevent strokes.  Have regular diabetes screenings. This involves taking a blood sample to check your fasting blood sugar level. ? If you are at a normal weight and have a low risk for diabetes, have this test once every three years after 75 years of age. ? If you are overweight and have a high risk for diabetes, consider being tested at a younger age or more often. Preventing infection Hepatitis B  If you have a higher risk for hepatitis B, you should be screened for this virus. You are considered at high risk for hepatitis B if: ? You were born in a country where hepatitis B is common. Ask your health care provider which countries are considered high risk. ? Your parents were born in a high-risk country, and you have not been immunized against hepatitis B (hepatitis B vaccine). ? You have HIV or AIDS. ? You use needles to inject street drugs. ? You live with someone who has hepatitis B. ? You have had sex with someone who has hepatitis B. ? You get hemodialysis treatment. ? You take certain medicines for conditions, including cancer, organ transplantation, and autoimmune conditions.  Hepatitis C  Blood testing is recommended for: ? Everyone born from [birth year] through 1964. ? Anyone with known risk factors for hepatitis C.  Sexually transmitted infections (STIs)  You should be screened for sexually transmitted infections (STIs) including gonorrhea and chlamydia if: ? You are sexually active and are younger than 75 years of age. ? You are older than 75 years of age and your health care provider tells you that you are at risk for this type of infection. ? Your sexual activity has changed since you were last screened and you are at an increased  risk for chlamydia or gonorrhea. Ask your health care provider if you are at risk.  If you do not have HIV, but are at risk, it may be recommended that you take a prescription medicine daily to prevent HIV infection. This is called pre-exposure prophylaxis (PrEP). You are considered at risk if: ? You are sexually active and do not regularly use condoms or know the HIV status of your partner(s). ? You take drugs by  injection. ? You are sexually active with a partner who has HIV.  Talk with your health care provider about whether you are at high risk of being infected with HIV. If you choose to begin PrEP, you should first be tested for HIV. You should then be tested every 3 months for as long as you are taking PrEP. Pregnancy  If you are premenopausal and you may become pregnant, ask your health care provider about preconception counseling.  If you may become pregnant, take 400 to 800 micrograms (mcg) of folic acid every day.  If you want to prevent pregnancy, talk to your health care provider about birth control (contraception). Osteoporosis and menopause  Osteoporosis is a disease in which the bones lose minerals and strength with aging. This can result in serious bone fractures. Your risk for osteoporosis can be identified using a bone density scan.  If you are 65 years of age or older, or if you are at risk for osteoporosis and fractures, ask your health care provider if you should be screened.  Ask your health care provider whether you should take a calcium or vitamin D supplement to lower your risk for osteoporosis.  Menopause may have certain physical symptoms and risks.  Hormone replacement therapy may reduce some of these symptoms and risks. Talk to your health care provider about whether hormone replacement therapy is right for you. Follow these instructions at home:  Schedule regular health, dental, and eye exams.  Stay current with your immunizations.  Do not use any  tobacco products including cigarettes, chewing tobacco, or electronic cigarettes.  If you are pregnant, do not drink alcohol.  If you are breastfeeding, limit how much and how often you drink alcohol.  Limit alcohol intake to no more than 1 drink per day for nonpregnant women. One drink equals 12 ounces of beer, 5 ounces of wine, or 1 ounces of hard liquor.  Do not use street drugs.  Do not share needles.  Ask your health care provider for help if you need support or information about quitting drugs.  Tell your health care provider if you often feel depressed.  Tell your health care provider if you have ever been abused or do not feel safe at home. This information is not intended to replace advice given to you by your health care provider. Make sure you discuss any questions you have with your health care provider. Document Released: 10/14/2010 Document Revised: 09/06/2015 Document Reviewed: 01/02/2015 Elsevier Interactive Patient Education  2018 [employer] Diabetes Mellitus and Food It is important for you to manage your blood sugar (glucose) level. Your blood glucose level can be greatly affected by what you eat. Eating healthier foods in the appropriate amounts throughout the day at about the same time each day will help you control your blood glucose level. It can also help slow or prevent worsening of your diabetes mellitus. Healthy eating may even help you improve the level of your blood pressure and reach or maintain a healthy weight. General recommendations for healthful eating and cooking habits include:  Eating meals and snacks regularly. Avoid going long periods of time without eating to lose weight.  Eating a diet that consists mainly of plant-based foods, such as fruits, vegetables, nuts, legumes, and whole grains.  Using low-heat cooking methods, such as baking, instead of high-heat cooking methods, such as deep frying.  Work with your dietitian to make sure you  understand how to use the Nutrition Facts information on food labels. How   can food affect me? Carbohydrates Carbohydrates affect your blood glucose level more than any other type of food. Your dietitian will help you determine how many carbohydrates to eat at each meal and teach you how to count carbohydrates. Counting carbohydrates is important to keep your blood glucose at a healthy level, especially if you are using insulin or taking certain medicines for diabetes mellitus. Alcohol Alcohol can cause sudden decreases in blood glucose (hypoglycemia), especially if you use insulin or take certain medicines for diabetes mellitus. Hypoglycemia can be a life-threatening condition. Symptoms of hypoglycemia (sleepiness, dizziness, and disorientation) are similar to symptoms of having too much alcohol. If your health care provider has given you approval to drink alcohol, do so in moderation and use the following guidelines:  Women should not have more than one drink per day, and men should not have more than two drinks per day. One drink is equal to: ? 12 oz of beer. ? 5 oz of wine. ? 1 oz of hard liquor.  Do not drink on an empty stomach.  Keep yourself hydrated. Have water, diet soda, or unsweetened iced tea.  Regular soda, juice, and other mixers might contain a lot of carbohydrates and should be counted.  What foods are not recommended? As you make food choices, it is important to remember that all foods are not the same. Some foods have fewer nutrients per serving than other foods, even though they might have the same number of calories or carbohydrates. It is difficult to get your body what it needs when you eat foods with fewer nutrients. Examples of foods that you should avoid that are high in calories and carbohydrates but low in nutrients include:  Trans fats (most processed foods list trans fats on the Nutrition Facts label).  Regular soda.  Juice.  Candy.  Sweets, such as cake,  pie, doughnuts, and cookies.  Fried foods.  What foods can I eat? Eat nutrient-rich foods, which will nourish your body and keep you healthy. The food you should eat also will depend on several factors, including:  The calories you need.  The medicines you take.  Your weight.  Your blood glucose level.  Your blood pressure level.  Your cholesterol level.  You should eat a variety of foods, including:  Protein. ? Lean cuts of meat. ? Proteins low in saturated fats, such as fish, egg whites, and beans. Avoid processed meats.  Fruits and vegetables. ? Fruits and vegetables that may help control blood glucose levels, such as apples, mangoes, and yams.  Dairy products. ? Choose fat-free or low-fat dairy products, such as milk, yogurt, and cheese.  Grains, bread, pasta, and rice. ? Choose whole grain products, such as multigrain bread, whole oats, and brown rice. These foods may help control blood pressure.  Fats. ? Foods containing healthful fats, such as nuts, avocado, olive oil, canola oil, and fish.  Does everyone with diabetes mellitus have the same meal plan? Because every person with diabetes mellitus is different, there is not one meal plan that works for everyone. It is very important that you meet with a dietitian who will help you create a meal plan that is just right for you. This information is not intended to replace advice given to you by your health care provider. Make sure you discuss any questions you have with your health care provider. Document Released: 12/26/2004 Document Revised: 09/06/2015 Document Reviewed: 02/25/2013 Elsevier Interactive Patient Education  2017 [employer].

## 2017-02-12 NOTE — Assessment & Plan Note (Addendum)
Update DEXA.  Pt compliant with calcium /vit D BID.  Desires to avoid bisphosphonate. Discussed options. She is interested in prolia. We will check on prolia for patient.

## 2017-02-12 NOTE — Progress Notes (Signed)
BP 120/60 (BP Location: Left Arm, Patient Position: Sitting, Cuff Size: Normal)   Pulse 74   Temp 97.8 F (36.6 C) (Oral)   Ht 5\' 4"  (1.626 m)   Wt 154 lb (69.9 kg)   LMP  (LMP Unknown)   SpO2 98%   BMI 26.43 kg/m    CC: CPE Subjective:    Patient ID: Kathleen Williamson, female    DOB: 11-03-1940, 76 y.o.   MRN: [id number]  HPI: Kathleen Williamson is a 76 y.o. female presenting on 02/12/2017 for Annual Exam (Pt 2)   Saw Katha Cabal last week for medicare wellness visit. Note reviewed.    She has started using MoveFree OTC supplement - with improvement of R hip and L knee pain. This contains glucosamine chondroitin. She also did have synvisc injection by Dr Lorelei Pont 11/2016.  She is on 3 antidepressants as well as klonopin.   Lesion on left side of tongue that is asymptomatic.   Preventative: Colonoscopy 05/23/2014 microscopic colitis attributed to zoloft (Dr Wynetta Emery at [facility]) Breast cancer screening - yearly, due December.  Well woman with pap 03/2014 ([facility]) - will not return. S/p complete hysterectomy with B oophorectomy for endometriosis.  Lung cancer screening - never smoker DEXA scan - 01/2015 -2.9 hip, -2.2 spine. Does not want bisphosphonate. She has not been on treatment for this. She does take calcium and vitamin D regularly.  Flu shot yearly Td - 2008 Pneumovax 2008, prevnar 2017 zostavax - 2011 shingrix - discussed Advanced directive discussion - has completed through attorney. Will bring [name] copy. Very difficult to complete since husband's passing. Thinking of getting [place] involved.  Seat belt use discussed  Sunscreen use discussed, no changing moles on skin Non smoker Alcohol - none  Lives alone at [facility]. Husband of 45 yrs died suddenly 10-15-13.  No children, no siblings.  Close friends Bethena Roys and Ace Gins nearby, cousin in [place]. [place]: [place] Diet: poor - lots of frozen dinners, eating out  Relevant past medical, surgical,  family and social history reviewed and updated as indicated. Interim medical history since our last visit reviewed. Allergies and medications reviewed and updated. Outpatient Medications Prior to Visit  Medication Sig Dispense Refill  . aspirin EC 81 MG tablet Take 81 mg by mouth at bedtime.    . Biotin 5 MG CAPS Take 1 capsule by mouth daily.    . Calcium Carbonate-Vitamin D 600-400 MG-UNIT tablet Take 1 tablet by mouth 2 (two) times daily.    . citalopram (CELEXA) 20 MG tablet Take 1 tablet (20 mg total) by mouth daily. 30 tablet 6  . clonazePAM (KLONOPIN) 0.5 MG tablet TAKE 1 TABLET BY MOUTH TWICE (2) DAILY AS NEEDED 60 tablet 0  . Coenzyme Q10 (COQ10) 100 MG CAPS Take 1 capsule by mouth at bedtime.     . Glucosamine-Chondroitin (MOVE FREE PO) Take 2 tablets by mouth daily.    Marland Kitchen levothyroxine (SYNTHROID, LEVOTHROID) 50 MCG tablet TAKE 1 TABLET EVERY DAY 90 tablet 1  . loperamide (IMODIUM A-D) 2 MG tablet Take 2 mg by mouth every morning.    . mirtazapine (REMERON) 15 MG tablet TAKE 1 TABLET BY MOUTH AT BEDTIME 30 tablet 0  . nitroGLYCERIN (NITROLINGUAL) 0.4 MG/SPRAY spray Place 1 spray under the tongue every 5 (five) minutes as needed. 12 g prn  . pravastatin (PRAVACHOL) 80 MG tablet Take 1 tablet (80 mg total) by mouth every evening. 90 tablet 3  . venlafaxine XR (  EFFEXOR-XR) 75 MG 24 hr capsule Take 1 capsule (75 mg total) by mouth daily with breakfast. 30 capsule 6  . vitamin B-12 (CYANOCOBALAMIN) 1000 MCG tablet Take 1,000 mcg by mouth daily.    Marland Kitchen venlafaxine XR (EFFEXOR-XR) 37.5 MG 24 hr capsule TAKE 1 CAPSULE DAILY FOR 1 WEEK THEN INCREASE TO 2 CAPSULES DAILY 60 capsule 0  . ezetimibe (ZETIA) 10 MG tablet Take 1 tablet (10 mg total) by mouth daily. 90 tablet 3   No facility-administered medications prior to visit.      Per HPI unless specifically indicated in ROS section below Review of Systems  Constitutional: Negative for activity change, appetite change, chills, fatigue, fever  and unexpected weight change.  HENT: Negative for hearing loss.   Eyes: Negative for visual disturbance.  Respiratory: Negative for cough, chest tightness, shortness of breath and wheezing.   Cardiovascular: Negative for chest pain, palpitations and leg swelling.  Gastrointestinal: Negative for abdominal distention, abdominal pain, blood in stool, constipation, diarrhea, nausea and vomiting.  Genitourinary: Negative for difficulty urinating and hematuria.  Musculoskeletal: Negative for arthralgias, myalgias and neck pain.  Skin: Negative for rash.  Neurological: Negative for dizziness, seizures, syncope and headaches.  Hematological: Negative for adenopathy. Does not bruise/bleed easily.  Psychiatric/Behavioral: Negative for dysphoric mood. The patient is not nervous/anxious.        Objective:    BP 120/60 (BP Location: Left Arm, Patient Position: Sitting, Cuff Size: Normal)   Pulse 74   Temp 97.8 F (36.6 C) (Oral)   Ht 5\' 4"  (1.626 m)   Wt 154 lb (69.9 kg)   LMP  (LMP Unknown)   SpO2 98%   BMI 26.43 kg/m   Wt Readings from Last 3 Encounters:  02/12/17 154 lb (69.9 kg)  02/10/17 152 lb 8 oz (69.2 kg)  12/19/16 154 lb (69.9 kg)    Physical Exam  Constitutional: She is oriented to person, place, and time. She appears well-developed and well-nourished. No distress.  HENT:  Head: Normocephalic and atraumatic.  Right Ear: Hearing, tympanic membrane, external ear and ear canal normal.  Left Ear: Hearing, tympanic membrane, external ear and ear canal normal.  Nose: Nose normal.  Mouth/Throat: Uvula is midline, oropharynx is clear and moist and mucous membranes are normal. No oropharyngeal exudate, posterior oropharyngeal edema or posterior oropharyngeal erythema.  Well circumscribed smooth lesion L posterior tongue about 1+ cm diameter  Eyes: Pupils are equal, round, and reactive to light. Conjunctivae and EOM are normal. No scleral icterus.  Neck: Normal range of motion. Neck  supple. Carotid bruit is not present. No thyromegaly present.  Cardiovascular: Normal rate, regular rhythm, normal heart sounds and intact distal pulses.   No murmur heard. Pulses:      Radial pulses are 2+ on the right side, and 2+ on the left side.  Pulmonary/Chest: Effort normal and breath sounds normal. No respiratory distress. She has no wheezes. She has no rales.  Abdominal: Soft. Bowel sounds are normal. She exhibits no distension and no mass. There is no tenderness. There is no rebound and no guarding.  Musculoskeletal: Normal range of motion. She exhibits no edema.  Lymphadenopathy:    She has no cervical adenopathy.  Neurological: She is alert and oriented to person, place, and time.  CN grossly intact, station and gait intact  Skin: Skin is warm and dry. No rash noted.  Psychiatric: She has a normal mood and affect. Her behavior is normal. Judgment and thought content normal.  Nursing note and  vitals reviewed.  Results for orders placed or performed in visit on 02/10/17  VITAMIN D 25 Hydroxy (Vit-D Deficiency, Fractures)  Result Value Ref Range   VITD 43.46 30.00 - 100.00 ng/mL  Comprehensive metabolic panel  Result Value Ref Range   Sodium 141 135 - 145 mEq/L   Potassium 5.3 (H) 3.5 - 5.1 mEq/L   Chloride 108 96 - 112 mEq/L   CO2 26 19 - 32 mEq/L   Glucose, Bld 140 (H) 70 - 99 mg/dL   BUN 14 6 - 23 mg/dL   Creatinine, Ser 1.37 (H) 0.40 - 1.20 mg/dL   Total Bilirubin 0.5 0.2 - 1.2 mg/dL   Alkaline Phosphatase 59 39 - 117 U/L   AST 14 0 - 37 U/L   ALT 11 0 - 35 U/L   Total Protein 7.0 6.0 - 8.3 g/dL   Albumin 4.3 3.5 - 5.2 g/dL   Calcium 10.1 8.4 - 10.5 mg/dL   GFR 39.93 (L) >60.00 mL/min  Lipid panel  Result Value Ref Range   Cholesterol 138 0 - 200 mg/dL   Triglycerides 196.0 (H) 0.0 - 149.0 mg/dL   HDL 36.80 (L) >39.00 mg/dL   VLDL 39.2 0.0 - 40.0 mg/dL   LDL Cholesterol 62 0 - 99 mg/dL   Total CHOL/HDL Ratio 4    NonHDL 101.18   TSH  Result Value Ref Range    TSH 2.58 0.35 - 4.50 uIU/mL  Hemoglobin A1c  Result Value Ref Range   Hgb A1c MFr Bld 6.8 (H) 4.6 - 6.5 %  Microalbumin / creatinine urine ratio  Result Value Ref Range   Microalb, Ur <0.7 0.0 - 1.9 mg/dL   Creatinine,U 60.9 mg/dL   Microalb Creat Ratio 1.2 0.0 - 30.0 mg/g      Assessment & Plan:   Problem List Items Addressed This Visit    Advanced care planning/counseling discussion    Advanced directive discussion - has completed through attorney. Will bring [name] copy. Very difficult to complete since husband's passing. Thinking of getting [place] involved.       CKD (chronic kidney disease) stage 3, GFR 30-59 ml/min (HCC)    Encouraged good hydration status.       Controlled type 2 diabetes mellitus with diabetic nephropathy (HCC)    Chronic, diet controlled. Will refer to DSME per patient. Reviewed with patient. Diabetic diet handout provided today.       Relevant Orders   Ambulatory referral to diabetic education   Dermatomyositis ([place])    Quiescent.       Health maintenance examination - Primary    Preventative protocols reviewed and updated unless pt declined. Discussed healthy diet and lifestyle.       Hypothyroidism    Chronic, stable. Continue current regimen. She separates levothyroxine from all other meds including biotin       MDD (major depressive disorder), recurrent episode, moderate (HCC)    Chronic, stable on 3 drug regimen. Continue current regimen. Continue counseling ([facility]).       Osteoarthritis    Symptoms have improved with MoveFree      Osteoporosis    Update DEXA.  Pt compliant with calcium /vit D BID.  Desires to avoid bisphosphonate. Discussed options. She is interested in prolia. We will check on prolia for patient.      Relevant Orders   DG Bone Density   Pure hypercholesterolemia    Chronic, stable. Continue current regimen of pravastatin and zetia. The 10-year ASCVD  risk score Mikey Bussing DC Jr., et al.,  2013) is: 24.9%   Values used to calculate the score:     Age: 29 years     Sex: Female     Is Non-Hispanic African American: No     Diabetic: Yes     Tobacco smoker: No     Systolic Blood Pressure: 355 mmHg     Is BP treated: No     HDL Cholesterol: 36.8 mg/dL     Total Cholesterol: 138 mg/dL       Tongue lesion    rec oral surgeon eval. Pt prefers to start with her dentist - she will call to schedule sooner appointment.           Follow up plan: Return in about 6 months (around 08/12/2017) for follow up visit.  [name], MD

## 2017-02-12 NOTE — Assessment & Plan Note (Signed)
Chronic, diet controlled. Will refer to DSME per patient. Reviewed with patient. Diabetic diet handout provided today.

## 2017-02-12 NOTE — Assessment & Plan Note (Signed)
Chronic, stable. Continue current regimen. She separates levothyroxine from all other meds including biotin

## 2017-02-12 NOTE — Assessment & Plan Note (Signed)
Chronic, stable on 3 drug regimen. Continue current regimen. Continue counseling ([facility]).

## 2017-02-13 ENCOUNTER — Telehealth: Payer: Self-pay | Admitting: *Deleted

## 2017-02-13 NOTE — Telephone Encounter (Signed)
Information has been submitted to pts insurance for verification of benefits. Awaiting response for coverage  

## 2017-02-13 NOTE — Telephone Encounter (Signed)
-----   Message from [name], MD sent at 02/12/2017  9:17 AM EDT ----- plz check on prolia for patient. Thanks.

## 2017-02-17 ENCOUNTER — Encounter: Payer: Self-pay | Admitting: Family Medicine

## 2017-02-17 ENCOUNTER — Other Ambulatory Visit: Payer: Self-pay | Admitting: Family Medicine

## 2017-02-17 NOTE — Telephone Encounter (Signed)
Last filled:  01/20/17, #60 Last OV (CPE):  02/12/17 Next OV:  08/13/17

## 2017-02-18 NOTE — Telephone Encounter (Signed)
Please call in.  Thanks.   

## 2017-02-18 NOTE — Telephone Encounter (Signed)
Refill left on vm at pharmacy per Dr. Duncan.  

## 2017-02-18 NOTE — Progress Notes (Signed)
I reviewed health advisor's note, was available for consultation, and agree with documentation and plan.  

## 2017-02-23 ENCOUNTER — Ambulatory Visit: Payer: Self-pay | Admitting: Psychology

## 2017-03-02 ENCOUNTER — Other Ambulatory Visit: Payer: Self-pay | Admitting: Family Medicine

## 2017-03-10 NOTE — Telephone Encounter (Signed)
Verification of benefits have been processed and an approval has been received for pts prolia injection. Pts estimated cost are appx $225. This is only an estimate and cannot be confirmed until benefits are paid. Please advise pt and schedule if needed. If scheduled, once the injection is received, pls contact me back with the date it was received so that I am able to update prolia folder. thanks

## 2017-03-10 NOTE — Telephone Encounter (Signed)
Lm on pts vm requesting a call back. Pt is to be added to prolia book if she is agreeable to out of pocket charges

## 2017-03-17 ENCOUNTER — Other Ambulatory Visit: Payer: Self-pay | Admitting: Family Medicine

## 2017-03-18 NOTE — Telephone Encounter (Signed)
Last filled:  02/18/17, #60 by Dr. Damita Dunnings Last OV (CPE):  02/12/17 Next OV:  08/13/17

## 2017-03-19 NOTE — Telephone Encounter (Signed)
Sent electronically 

## 2017-04-15 ENCOUNTER — Other Ambulatory Visit: Payer: Self-pay | Admitting: Family Medicine

## 2017-04-16 NOTE — Telephone Encounter (Signed)
Last filled:  03/19/17, #60 Last OV (CPE):  02/12/17 Next OV:  08/13/17

## 2017-04-17 NOTE — Telephone Encounter (Signed)
Sent electronically 

## 2017-04-21 NOTE — Telephone Encounter (Signed)
Noted  

## 2017-04-30 ENCOUNTER — Other Ambulatory Visit: Payer: Self-pay | Admitting: Family Medicine

## 2017-05-14 ENCOUNTER — Other Ambulatory Visit: Payer: Self-pay | Admitting: Family Medicine

## 2017-05-14 ENCOUNTER — Other Ambulatory Visit: Payer: Self-pay | Admitting: Cardiology

## 2017-05-18 ENCOUNTER — Other Ambulatory Visit: Payer: Self-pay | Admitting: Family Medicine

## 2017-05-18 NOTE — Telephone Encounter (Signed)
Last filled:  04/17/17, #60 Last OV (CPE):  02/12/17 Next OV:  08/13/17

## 2017-05-19 ENCOUNTER — Encounter: Payer: Self-pay | Admitting: *Deleted

## 2017-05-19 ENCOUNTER — Encounter: Payer: PPO | Attending: Family Medicine | Admitting: *Deleted

## 2017-05-19 VITALS — BP 110/60 | Ht 64.0 in | Wt 149.3 lb

## 2017-05-19 DIAGNOSIS — E1121 Type 2 diabetes mellitus with diabetic nephropathy: Secondary | ICD-10-CM | POA: Insufficient documentation

## 2017-05-19 DIAGNOSIS — Z713 Dietary counseling and surveillance: Secondary | ICD-10-CM | POA: Insufficient documentation

## 2017-05-19 DIAGNOSIS — E119 Type 2 diabetes mellitus without complications: Secondary | ICD-10-CM

## 2017-05-19 NOTE — Patient Instructions (Addendum)
Exercise: Begin walking  for  10-15  minutes   3 days a week and gradually increase as tolerated  Eat 3 meals day,   1-2  snacks a day Space meals 4-6 hours apart Don't skip meals Avoid sugar sweetened drinks (soda, tea, juices) Limit desserts/sweets  Return for classes on:

## 2017-05-19 NOTE — Progress Notes (Signed)
Diabetes Self-Management Education  Visit Type: First/Initial  Appt. Start Time: 1105 Appt. End Time: 9702  05/19/2017  [name], identified by name and date of birth, is a 77 y.o. female with a diagnosis of Diabetes: Type 2.   ASSESSMENT  Blood pressure 110/60, height 5\' 4"  (1.626 m), weight 149 lb 4.8 oz (67.7 kg). Body mass index is 25.63 kg/m.  Diabetes Self-Management Education - 05/19/17 1304      Visit Information   Visit Type  First/Initial      Initial Visit   Diabetes Type  Type 2    Are you currently following a meal plan?  No    Are you taking your medications as prescribed?  Yes    Date Diagnosed  1 year ago      Psychosocial Assessment   Patient Belief/Attitude about Diabetes  Other (comment) "not happy to have it"    Self-care barriers  Other (comment) depression    Self-management support  Doctor's office;Friends;Family    Patient Concerns  Nutrition/Meal planning;Glycemic Control;Weight Control;Monitoring;Healthy Lifestyle    Special Needs  None    Preferred Learning Style  Hands on;Auditory    Learning Readiness  Ready    How often do you need to have someone help you when you read instructions, pamphlets, or other written materials from your doctor or pharmacy?  1 - Never    What is the last grade level you completed in school?  12th      Pre-Education Assessment   Patient understands the diabetes disease and treatment process.  Needs Instruction    Patient understands incorporating nutritional management into lifestyle.  Needs Instruction    Patient undertands incorporating physical activity into lifestyle.  Needs Instruction    Patient understands using medications safely.  Needs Instruction    Patient understands monitoring blood glucose, interpreting and using results  Needs Instruction    Patient understands prevention, detection, and treatment of acute complications.  Needs Instruction    Patient understands prevention, detection, and  treatment of chronic complications.  Needs Instruction    Patient understands how to develop strategies to address psychosocial issues.  Needs Instruction    Patient understands how to develop strategies to promote health/change behavior.  Needs Instruction      Complications   Last HgB A1C per patient/outside source  6.8 % 02/10/17    How often do you check your blood sugar?  Patient declines    Have you had a dilated eye exam in the past 12 months?  No    Have you had a dental exam in the past 12 months?  Yes    Are you checking your feet?  Yes    How many days per week are you checking your feet?  7      Dietary Intake   Breakfast  Go Ruby Go powder with 8 oz juice    Snack (morning)  fruit    Lunch  skips    Snack (afternoon)  fruit    Dinner  eats out for supper daily - chicken, fish, some beef; sweet potatoes, beans, pasta, rice, salads    Beverage(s)  water, coffee, regular soda, 1/2 and 1/2 tea      Exercise   Exercise Type  ADL's      Patient Education   Previous Diabetes Education  No    Disease state   Definition of diabetes, type 1 and 2, and the diagnosis of diabetes    Nutrition management  Role of diet in the treatment of diabetes and the relationship between the three main macronutrients and blood glucose level    Physical activity and exercise   Role of exercise on diabetes management, blood pressure control and cardiac health.    Monitoring  Identified appropriate SMBG and/or A1C goals.    Chronic complications  Relationship between chronic complications and blood glucose control    Psychosocial adjustment  Role of stress on diabetes;Identified and addressed patients feelings and concerns about diabetes      Individualized Goals (developed by patient)   Reducing Risk  Improve blood sugars Prevent diabetes complications Lose weight Lead a healthier lifestyle Become more fit     Outcomes   Expected Outcomes  Demonstrated interest in learning. Expect positive  outcomes    Future DMSE  4-6 wks       Individualized Plan for Diabetes Self-Management Training:   Learning Objective:  Patient will have a greater understanding of diabetes self-management. Patient education plan is to attend individual and/or group sessions per assessed needs and concerns.   Plan:   Patient Instructions  Exercise: Begin walking  for  10-15  minutes   3 days a week and gradually increase as tolerated Eat 3 meals day,   1-2  snacks a day Space meals 4-6 hours apart Don't skip meals Avoid sugar sweetened drinks (soda, tea, juices) Limit desserts/sweets   Expected Outcomes:  Demonstrated interest in learning. Expect positive outcomes  Education material provided: General Meal Planning Guidelines Simple Meal Plan  If problems or questions, patient to contact team via:  Johny Drilling, [place], [place], CDE [phone]  Future DSME appointment: 4-6 wks  June 15, 2017 for Diabetes Class 1

## 2017-05-20 ENCOUNTER — Telehealth: Payer: Self-pay | Admitting: Family Medicine

## 2017-05-20 MED ORDER — LEVOTHYROXINE SODIUM 50 MCG PO TABS: 50.0000 ug | ORAL_TABLET | Freq: Every day | ORAL | 1 refills | Status: DC

## 2017-05-20 NOTE — Telephone Encounter (Signed)
Refilled electronically 

## 2017-05-20 NOTE — Telephone Encounter (Signed)
Spoke with pt she needs a refill on clonazepam and  [place] [facility]  levothyroxine this is at [facility] number [phone]   Please let pt know when this has been called in

## 2017-05-20 NOTE — Telephone Encounter (Signed)
Spoke with pt notifying her both meds were sent to preferred designated pharmacies.

## 2017-06-01 ENCOUNTER — Encounter: Payer: Self-pay | Admitting: Family Medicine

## 2017-06-01 DIAGNOSIS — Z1231 Encounter for screening mammogram for malignant neoplasm of breast: Secondary | ICD-10-CM | POA: Diagnosis not present

## 2017-06-01 DIAGNOSIS — M810 Age-related osteoporosis without current pathological fracture: Secondary | ICD-10-CM | POA: Diagnosis not present

## 2017-06-01 DIAGNOSIS — M8589 Other specified disorders of bone density and structure, multiple sites: Secondary | ICD-10-CM | POA: Diagnosis not present

## 2017-06-11 ENCOUNTER — Other Ambulatory Visit: Payer: Self-pay | Admitting: Cardiology

## 2017-06-13 ENCOUNTER — Telehealth: Payer: Self-pay | Admitting: Family Medicine

## 2017-06-13 ENCOUNTER — Other Ambulatory Visit: Payer: Self-pay | Admitting: Cardiology

## 2017-06-14 ENCOUNTER — Other Ambulatory Visit: Payer: Self-pay | Admitting: Family Medicine

## 2017-06-15 ENCOUNTER — Encounter: Payer: PPO | Attending: Family Medicine | Admitting: Dietician

## 2017-06-15 ENCOUNTER — Encounter: Payer: Self-pay | Admitting: Dietician

## 2017-06-15 ENCOUNTER — Other Ambulatory Visit: Payer: Self-pay | Admitting: Family Medicine

## 2017-06-15 VITALS — Ht 64.0 in | Wt 152.3 lb

## 2017-06-15 DIAGNOSIS — Z713 Dietary counseling and surveillance: Secondary | ICD-10-CM | POA: Diagnosis not present

## 2017-06-15 DIAGNOSIS — E119 Type 2 diabetes mellitus without complications: Secondary | ICD-10-CM

## 2017-06-15 DIAGNOSIS — E1121 Type 2 diabetes mellitus with diabetic nephropathy: Secondary | ICD-10-CM | POA: Insufficient documentation

## 2017-06-15 NOTE — Telephone Encounter (Signed)
Last filled:  05/20/17, #60 Last OV (CPE):  02/12/17 Next OV:  08/13/17

## 2017-06-15 NOTE — Progress Notes (Signed)

## 2017-06-15 NOTE — Progress Notes (Signed)
Pt does not have a BG meter and requests one. Gave pt Accuchek Guide meter and instructed on its use.

## 2017-06-16 NOTE — Telephone Encounter (Signed)
Eprescribed.

## 2017-06-17 NOTE — Telephone Encounter (Signed)
Copied from [facility]. Topic: Quick Communication - Rx Refill/Question >> Jun 17, 2017  2:18 PM Neva Seat wrote: Accu Check guide test strips Accu Check fast clix lancets  Nurse at [facility] is telling pt she needs the strips and lancets for her BS testing.  [id number] [place] [address] [place] [address] Phone: [phone] Fax: [phone]

## 2017-06-18 MED ORDER — GLUCOSE BLOOD VI STRP: 1.0000 | ORAL_STRIP | 3 refills | Status: DC | PRN

## 2017-06-18 MED ORDER — ACCU-CHEK FASTCLIX LANCETS MISC: 1.0000 | Freq: Every day | 3 refills | Status: DC

## 2017-06-18 NOTE — Telephone Encounter (Signed)
Sent rxs for test strips and lancets to [facility] [address]   [name] with pt notifying her rxs were sent.  Expresses her thanks.

## 2017-06-22 ENCOUNTER — Encounter: Payer: PPO | Admitting: Dietician

## 2017-06-22 VITALS — Wt 149.0 lb

## 2017-06-22 DIAGNOSIS — Z713 Dietary counseling and surveillance: Secondary | ICD-10-CM | POA: Diagnosis not present

## 2017-06-22 DIAGNOSIS — E119 Type 2 diabetes mellitus without complications: Secondary | ICD-10-CM

## 2017-06-22 NOTE — Progress Notes (Signed)

## 2017-06-24 ENCOUNTER — Telehealth: Payer: Self-pay | Admitting: *Deleted

## 2017-06-24 NOTE — Telephone Encounter (Signed)
Received voice mail from patient regarding questions about her meter. Called patient and she reports her insurance will not pay for Accu-Chek strips. She was given a meter when she came to diabetes class this month. When she went to pick up her strips the cost was $70. She spoke with her insurance and they cover One Touch and FreeStyle Precision. Explained that we have One Touch meters and will give her another meter. She will pick up when she returns for her class on Monday March 18.

## 2017-06-29 ENCOUNTER — Encounter: Payer: PPO | Admitting: Dietician

## 2017-06-29 ENCOUNTER — Encounter: Payer: Self-pay | Admitting: Dietician

## 2017-06-29 VITALS — BP 140/68 | Ht 64.0 in | Wt 148.8 lb

## 2017-06-29 DIAGNOSIS — Z713 Dietary counseling and surveillance: Secondary | ICD-10-CM | POA: Diagnosis not present

## 2017-06-29 DIAGNOSIS — E119 Type 2 diabetes mellitus without complications: Secondary | ICD-10-CM

## 2017-06-29 NOTE — Progress Notes (Signed)

## 2017-06-30 NOTE — Progress Notes (Signed)
CARDIOLOGY OFFICE NOTE  Date:  07/01/2017    Kathleen Williamson Date of Birth: [birth year] Medical Record #[id number]  PCP:  [name], MD  Cardiologist:  [facility]    Chief Complaint  Patient presents with  . Coronary Artery Disease    One year check - seen for Dr. Marlou Porch    History of Present Illness: Kathleen Williamson is a 77 y.o. female who presents today for a one year check. Seen for Dr. Marlou Porch.   She has a history of CAD with CABG in 2003 (CABG-LIMA-LAD, free right IMA to PDA,SVG-diagonal, SVG-OM 2-OM 5 03/2002), carotid artery disease, dermatomyositis, depression, hyperlipidemia, & hypothyroidism.  Former patient of Dr. Leonia Reeves. Nuclear stress test in 2012 showed no ischemia.   She has HLD - intolerant to most statins but able to take Pravachol. Husband died in 8. She has had a prolonged grief. No children. No siblings.   Last seen a year ago and felt to be doing ok.    Comes in today. Here alone. Doing ok. She has her good days and bad days. Continues to miss her husband. She has moved - making new friends. Does try to stay active. Planning on going to a new church - says her old friends want nothing to do with her since she is now alone. No chest pain. Breathing is ok. She is fairly active. Tries to walk most days. Did have one night of some fleeting fast heart beating - has not recurred. She needs her medicines refilled. She notes life is hard but she is "doing the best I can". She really favors a conservative approach. Notes "ready to go" to join her husband but denies being suicidal.   Past Medical History:  Diagnosis Date  . Arthritis    fingers  . CAD (coronary artery disease) 2003   MI s/p 5v CABG  . Carotid stenosis    [place]  . CKD (chronic kidney disease) stage 3, GFR 30-59 ml/min (HCC)   . Dermatomyositis [facility])    saw Dr Estanislado Pandy, improved on its own  . Diabetes mellitus without complication ([place])   . History of chicken pox   . History of measles     . History of migraine none since 2003  . HLD (hyperlipidemia)   . Hypothyroidism   . MDD (major depressive disorder), recurrent episode, moderate ([place])    h/o SI after lost husband unexpectedly 2015  . Microscopic colitis 05/2014   by colonoscopy, lymphocytic, zoloft related  . Osteoporosis    DEXA 03/2013 T -3.1, DEXA 10//2016 -2.9 hip, -2.2 spine  . Pancreatic cyst    Dr Ralene Ok, no f/u needed  . Prediabetes 2014  . Subclavian steal syndrome   . Urine incontinence     Past Surgical History:  Procedure Laterality Date  . APPENDECTOMY  1997   with hysterectomy  . [place]   lower  . BREAST LUMPECTOMY Left 1977   benign  . COLONOSCOPY WITH PROPOFOL N/A 05/23/2014   microscopic colitis - Garlan Fair, MD  . CORONARY ARTERY BYPASS GRAFT  2003   x 5 v  . ESOPHAGOGASTRODUODENOSCOPY (EGD) WITH PROPOFOL N/A 05/23/2014   WNL Garlan Fair, MD  . [place] Right 1990's  . TOOTH EXTRACTION  yrs ago  . TOTAL ABDOMINAL HYSTERECTOMY W/ BILATERAL SALPINGOOPHORECTOMY  1997   endometriosis     Medications: Current Meds  Medication Sig  . ACCU-CHEK FASTCLIX LANCETS MISC 1 each by Does  not apply route daily. Check blood sugars once daily.  Dx code:  E11.21  . aspirin EC 81 MG tablet Take 81 mg by mouth at bedtime.  . Biotin 5 MG CAPS Take 1 capsule by mouth daily.  . Calcium Carbonate-Vitamin D 600-400 MG-UNIT tablet Take 1 tablet by mouth 2 (two) times daily.  . citalopram (CELEXA) 20 MG tablet TAKE 1 TABLET BY MOUTH EVERY DAY  . clonazePAM (KLONOPIN) 0.5 MG tablet TAKE 1 TABLET BY MOUTH TWICE A DAY AS NEEDED  . Coenzyme Q10 (COQ10) 100 MG CAPS Take 1 capsule by mouth at bedtime.   Marland Kitchen ezetimibe (ZETIA) 10 MG tablet Take 1 tablet (10 mg total) by mouth daily.  . Glucosamine-Chondroitin (MOVE FREE PO) Take 2 tablets by mouth daily.  Marland Kitchen glucose blood (ACCU-CHEK GUIDE) test strip 1 each by Other route as needed for other. Check blood sugars once daily.  Dx code:   E11.21  . levothyroxine (SYNTHROID, LEVOTHROID) 50 MCG tablet Take 1 tablet (50 mcg total) by mouth daily.  Marland Kitchen loperamide (IMODIUM A-D) 2 MG tablet Take 2 mg by mouth every morning.  . mirtazapine (REMERON) 15 MG tablet TAKE 1 TABLET BY MOUTH EVERYDAY AT BEDTIME  . nitroGLYCERIN (NITROLINGUAL) 0.4 MG/SPRAY spray Place 1 spray under the tongue every 5 (five) minutes as needed.  . pravastatin (PRAVACHOL) 80 MG tablet Take 1 tablet (80 mg total) by mouth every evening.  . venlafaxine XR (EFFEXOR-XR) 75 MG 24 hr capsule TAKE 1 CAPSULE (75 MG TOTAL) BY MOUTH DAILY WITH BREAKFAST.  Marland Kitchen vitamin B-12 (CYANOCOBALAMIN) 1000 MCG tablet Take 1,000 mcg by mouth daily.  . [DISCONTINUED] ezetimibe (ZETIA) 10 MG tablet Take 1 tablet (10 mg total) by mouth daily.  . [DISCONTINUED] ezetimibe (ZETIA) 10 MG tablet TAKE 1 TABLET BY MOUTH ONCE DAILY  . [DISCONTINUED] pravastatin (PRAVACHOL) 80 MG tablet Take 1 tablet (80 mg total) by mouth every evening. Please make overdue yearly appt with Dr. Marlou Porch before anymore refills. 2nd attempt     Allergies: Allergies  Allergen Reactions  . Sulfa Antibiotics Nausea Only  . Crestor [Rosuvastatin Calcium] Other (See Comments)    Leg ache    Social History: The patient  reports that  has never smoked. she has never used smokeless tobacco. She reports that she does not drink alcohol or use drugs.   Family History: The patient's family history includes CAD in her other; CAD (age of onset: 25) in her father; CAD (age of onset: 57) in her mother; Cancer in her maternal aunt; Diabetes in her mother; Rheum arthritis in her mother.   Review of Systems: Please see the history of present illness.   Otherwise, the review of systems is positive for none.   All other systems are reviewed and negative.   Physical Exam: VS:  BP 130/70 (BP Location: Left Arm, Patient Position: Sitting, Cuff Size: Normal)   Pulse 73   Ht 5\' 4"  (1.626 m)   Wt 148 lb 6.4 oz (67.3 kg)   LMP  (LMP  Unknown)   BMI 25.47 kg/m  .  BMI Body mass index is 25.47 kg/m.  Wt Readings from Last 3 Encounters:  07/01/17 148 lb 6.4 oz (67.3 kg)  06/29/17 148 lb 12.8 oz (67.5 kg)  06/22/17 149 lb (67.6 kg)    General: Pleasant. She seems sad. Alert and in no acute distress.   HEENT: Normal.  Neck: Supple, no JVD, carotid bruits, or masses noted.  Cardiac: Regular rate and rhythm. No murmurs,  rubs, or gallops. No edema.  Respiratory:  Lungs are clear to auscultation bilaterally with normal work of breathing.  GI: Soft and nontender.  MS: No deformity or atrophy. Gait and ROM intact.  Skin: Warm and dry. Color is normal.  Neuro:  Strength and sensation are intact and no gross focal deficits noted.  Psych: Alert, appropriate and with normal affect.   LABORATORY DATA:  EKG:  EKG is ordered today. This demonstrates sinus rhythm. She has diffuse T wave changes.  Lab Results  Component Value Date   WBC 7.6 02/04/2016   HGB 12.5 02/04/2016   HCT 37.4 02/04/2016   PLT 273.0 02/04/2016   GLUCOSE 140 (H) 02/10/2017   CHOL 138 02/10/2017   TRIG 196.0 (H) 02/10/2017   HDL 36.80 (L) 02/10/2017   LDLDIRECT 124.0 02/04/2016   LDLCALC 62 02/10/2017   ALT 11 02/10/2017   AST 14 02/10/2017   NA 141 02/10/2017   K 5.3 (H) 02/10/2017   CL 108 02/10/2017   CREATININE 1.37 (H) 02/10/2017   BUN 14 02/10/2017   CO2 26 02/10/2017   TSH 2.58 02/10/2017   HGBA1C 6.8 (H) 02/10/2017   MICROALBUR <0.7 02/10/2017     BNP (last 3 results) No results for input(s): BNP in the last 8760 hours.  ProBNP (last 3 results) No results for input(s): PROBNP in the last 8760 hours.   Other Studies Reviewed Today:   Assessment/Plan:  1. CAD with remote CABG in 2003 - managed medically. Doing well with no symptoms. CV risk factor modification encouraged.   2. HLD - only able to tolerate Pravachol/Zetia - labs from October noted. Meds refilled today.   3. Carotid disease - only with 1 to 39% bilateral  disease per study in 2017. FU prn.   4. Situational stress/grief - this is her most limiting factor - admits she is "ready to go". Not suicidal. Encouragement given.    Current medicines are reviewed with the patient today.  The patient does not have concerns regarding medicines other than what has been noted above.  The following changes have been made:  See above.  Labs/ tests ordered today include:    Orders Placed This Encounter  Procedures  . EKG 12-Lead     Disposition:   FU with Dr. Marlou Porch in one year.  I will be happy to see back as needed.   Patient is agreeable to this plan and will call if any problems develop in the interim.   SignedTruitt Merle, NP  07/01/2017 2:19 PM  [facility] [address] [place], [place]  [address] Phone: [phone] Fax: [phone]

## 2017-07-01 ENCOUNTER — Ambulatory Visit: Payer: PPO | Admitting: Nurse Practitioner

## 2017-07-01 ENCOUNTER — Encounter: Payer: Self-pay | Admitting: Nurse Practitioner

## 2017-07-01 VITALS — BP 130/70 | HR 73 | Ht 64.0 in | Wt 148.4 lb

## 2017-07-01 DIAGNOSIS — I259 Chronic ischemic heart disease, unspecified: Secondary | ICD-10-CM

## 2017-07-01 MED ORDER — PRAVASTATIN SODIUM 80 MG PO TABS: 80.0000 mg | ORAL_TABLET | Freq: Every evening | ORAL | 3 refills | Status: DC

## 2017-07-01 MED ORDER — EZETIMIBE 10 MG PO TABS: 10.0000 mg | ORAL_TABLET | Freq: Every day | ORAL | 3 refills | Status: DC

## 2017-07-01 NOTE — Patient Instructions (Addendum)
We will be checking the following labs today - NONE   Medication Instructions:    Continue with your current medicines.   I sent in your refills today.     Testing/Procedures To Be Arranged:  N/A  Follow-Up:   See Dr. Marlou Porch in a year    Other Special Instructions:   N/A    If you need a refill on your cardiac medications before your next appointment, please call your pharmacy.   Call the [place] office at [phone] if you have any questions, problems or concerns.

## 2017-07-08 ENCOUNTER — Encounter: Payer: Self-pay | Admitting: *Deleted

## 2017-07-20 ENCOUNTER — Other Ambulatory Visit: Payer: Self-pay | Admitting: Family Medicine

## 2017-07-20 NOTE — Telephone Encounter (Signed)
Last filled 06/16/17, #60 Last OV (CPE):  02/12/17 Next OV:  08/13/17

## 2017-07-22 ENCOUNTER — Other Ambulatory Visit: Payer: Self-pay | Admitting: Family Medicine

## 2017-07-22 NOTE — Telephone Encounter (Signed)
Copied from [place] [id number]. Topic: Quick Communication - Rx Refill/Question >> Jul 22, 2017  2:20 PM Lennox Solders wrote: Medication:clonazepam Has the patient contacted their pharmacy yes. Felicita Gage [phone] Pt is out of med

## 2017-07-22 NOTE — Telephone Encounter (Signed)
Spoke with pt notifying her rxs were sent in and reminded her of 72 hr policy.  Pt verbalizes understanding.

## 2017-07-22 NOTE — Telephone Encounter (Signed)
Pt calling for an update on med refill.  Adv pt med refill is still pending with PCP, pt states "I need this medication, my body is calling for it."

## 2017-07-22 NOTE — Telephone Encounter (Signed)
E prescribed. plz notify patient. Remind of our 72 hour policy.

## 2017-07-22 NOTE — Telephone Encounter (Signed)
Last OV: 02/12/17 PCP: [place]: [place], [place] [phone] (Phone) [phone] (Fax)

## 2017-08-13 ENCOUNTER — Encounter: Payer: Self-pay | Admitting: Family Medicine

## 2017-08-13 ENCOUNTER — Telehealth: Payer: Self-pay | Admitting: Family Medicine

## 2017-08-13 ENCOUNTER — Ambulatory Visit (INDEPENDENT_AMBULATORY_CARE_PROVIDER_SITE_OTHER): Payer: PPO | Admitting: Family Medicine

## 2017-08-13 VITALS — BP 120/64 | HR 84 | Temp 97.6°F | Ht 62.75 in | Wt 145.5 lb

## 2017-08-13 DIAGNOSIS — K148 Other diseases of tongue: Secondary | ICD-10-CM | POA: Diagnosis not present

## 2017-08-13 DIAGNOSIS — E1121 Type 2 diabetes mellitus with diabetic nephropathy: Secondary | ICD-10-CM | POA: Diagnosis not present

## 2017-08-13 DIAGNOSIS — R7303 Prediabetes: Secondary | ICD-10-CM

## 2017-08-13 DIAGNOSIS — M810 Age-related osteoporosis without current pathological fracture: Secondary | ICD-10-CM

## 2017-08-13 LAB — POCT GLYCOSYLATED HEMOGLOBIN (HGB A1C): HEMOGLOBIN A1C: 5.9

## 2017-08-13 MED ORDER — GLUCOSE BLOOD VI STRP: 1.0000 | ORAL_STRIP | 3 refills | Status: DC | PRN

## 2017-08-13 MED ORDER — ONETOUCH DELICA LANCETS 33G MISC: 1.0000 | 3 refills | Status: DC

## 2017-08-13 NOTE — Patient Instructions (Addendum)
We will check on prolia shot again.  I have sent in one touch strips.  Congratulations on healthy diet changes to date! Sugars are doing much better - now in prediabetes range.  Continue current regimen. Return as needed or in 6 months for physical and wellness visit.

## 2017-08-13 NOTE — Progress Notes (Signed)
BP 120/64 (BP Location: Left Arm, Patient Position: Sitting, Cuff Size: Normal)   Pulse 84   Temp 97.6 F (36.4 C) (Oral)   Ht 5' 2.75" (1.594 m)   Wt 145 lb 8 oz (66 kg)   LMP  (LMP Unknown)   SpO2 97%   BMI 25.98 kg/m    CC: 6 mo f/u visit Subjective:    Patient ID: Kathleen Williamson, female    DOB: 04/15/1940, 77 y.o.   MRN: [id number]  HPI: Kathleen Williamson is a 77 y.o. female presenting on 08/13/2017 for 6 mo follow-up   DM now in prediabetes range!! - does regularly check sugars fasting until she ran out of strips: <100 regularly. Compliant with antihyperglycemic regimen which includes: diet controlled. Has decreased sweets and simple carbs. Denies low sugars or hypoglycemic symptoms. Denies paresthesias. Last diabetic eye exam due. Pneumovax: 2008. Prevnar: 2017. Glucometer brand: one-touch. DSME: [facility] 06/2017.  Lab Results  Component Value Date   HGBA1C 5.9 08/13/2017   Diabetic Foot Exam - Simple   Simple Foot Form Diabetic Foot exam was performed with the following findings:  Yes 08/13/2017  9:51 AM  Visual Inspection No deformities, no ulcerations, no other skin breakdown bilaterally:  Yes Sensation Testing Intact to touch and monofilament testing bilaterally:  Yes Pulse Check Posterior Tibialis and Dorsalis pulse intact bilaterally:  Yes Comments    Lab Results  Component Value Date   MICROALBUR <0.7 02/10/2017     Osteoporosis - not interested in bisphosphonate. Last DEXA 05/2017 - T score -2.6 L hip. She is regular with cal/ vit D as well as regular weight bearing exercise.   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 Interim medical history since our last visit reviewed. Allergies and medications reviewed and updated. Outpatient Medications Prior to Visit  Medication Sig Dispense Refill  . aspirin EC 81 MG tablet Take 81 mg by mouth at  bedtime.    . Biotin 5 MG CAPS Take 1 capsule by mouth daily.    . Calcium Carbonate-Vitamin D 600-400 MG-UNIT tablet Take 1 tablet by mouth 2 (two) times daily.    . citalopram (CELEXA) 20 MG tablet TAKE 1 TABLET BY MOUTH EVERY DAY 30 tablet 6  . clonazePAM (KLONOPIN) 0.5 MG tablet TAKE 1 TABLET BY MOUTH TWICE A DAY AS NEEDED 60 tablet 0  . Coenzyme Q10 (COQ10) 100 MG CAPS Take 1 capsule by mouth at bedtime.     Marland Kitchen ezetimibe (ZETIA) 10 MG tablet Take 1 tablet (10 mg total) by mouth daily. 90 tablet 3  . Glucosamine-Chondroitin (MOVE FREE PO) Take 2 tablets by mouth daily.    Marland Kitchen levothyroxine (SYNTHROID, LEVOTHROID) 50 MCG tablet Take 1 tablet (50 mcg total) by mouth daily. 90 tablet 1  . loperamide (IMODIUM A-D) 2 MG tablet Take 2 mg by mouth every morning.    . mirtazapine (REMERON) 15 MG tablet TAKE 1 TABLET BY MOUTH EVERYDAY AT BEDTIME 30 tablet 6  . nitroGLYCERIN (NITROLINGUAL) 0.4 MG/SPRAY spray Place 1 spray under the tongue every 5 (five) minutes as needed. 12 g prn  . pravastatin (PRAVACHOL) 80 MG tablet Take 1 tablet (80 mg total) by mouth every evening. 90 tablet 3  . venlafaxine XR (EFFEXOR-XR) 75 MG 24 hr capsule TAKE 1 CAPSULE (75 MG TOTAL) BY MOUTH DAILY WITH BREAKFAST. 30 capsule 2  . vitamin B-12 (CYANOCOBALAMIN) 1000  MCG tablet Take 1,000 mcg by mouth daily.    Marland Kitchen ACCU-CHEK FASTCLIX LANCETS MISC 1 each by Does not apply route daily. Check blood sugars once daily.  Dx code:  E11.21 100 each 3  . glucose blood (ONETOUCH VERIO) test strip 1 each by Other route as needed for other. Use as instructed to check sugar once a day.  Dx code:  E11.21    . ONETOUCH DELICA LANCETS 38S MISC 1 each by Does not apply route. Use as instructed to check sugar once a day.  Dx code:  E11.21    . glucose blood (ACCU-CHEK GUIDE) test strip 1 each by Other route as needed for other. Check blood sugars once daily.  Dx code:  E11.21 100 each 3   No facility-administered medications prior to visit.       Per HPI unless specifically indicated in ROS section below Review of Systems     Objective:    BP 120/64 (BP Location: Left Arm, Patient Position: Sitting, Cuff Size: Normal)   Pulse 84   Temp 97.6 F (36.4 C) (Oral)   Ht 5' 2.75" (1.594 m)   Wt 145 lb 8 oz (66 kg)   LMP  (LMP Unknown)   SpO2 97%   BMI 25.98 kg/m   Wt Readings from Last 3 Encounters:  08/13/17 145 lb 8 oz (66 kg)  07/01/17 148 lb 6.4 oz (67.3 kg)  06/29/17 148 lb 12.8 oz (67.5 kg)    Physical Exam  Constitutional: She appears well-developed and well-nourished. No distress.  HENT:  Head: Normocephalic and atraumatic.  Right Ear: External ear normal.  Left Ear: External ear normal.  Nose: Nose normal.  Mouth/Throat: Oropharynx is clear and moist. No oropharyngeal exudate.  Eyes: Pupils are equal, round, and reactive to light. Conjunctivae and EOM are normal. No scleral icterus.  Neck: Normal range of motion. Neck supple.  Cardiovascular: Normal rate, regular rhythm, normal heart sounds and intact distal pulses.  No murmur heard. Pulmonary/Chest: Effort normal and breath sounds normal. No respiratory distress. She has no wheezes. She has no rales.  Musculoskeletal: She exhibits no edema.  See HPI for foot exam if done  Lymphadenopathy:    She has no cervical adenopathy.  Skin: Skin is warm and dry. No rash noted.  Psychiatric: She has a normal mood and affect.  Nursing note and vitals reviewed.  Results for orders placed or performed in visit on 08/13/17  POCT glycosylated hemoglobin (Hb A1C)  Result Value Ref Range   Hemoglobin A1C 5.9       Assessment & Plan:   Problem List Items Addressed This Visit    Osteoporosis    Will check into prolia for patient.       Prediabetes - Primary    Marked improvement with healthy diet changes - congratulated. She did complete DSME and is now in prediabetes range.       Tongue lesion    Benign lesion after dentist evaluation.           Meds  ordered this encounter  Medications  . glucose blood (ONETOUCH VERIO) test strip    Sig: 1 each by Other route as needed for other. Use as instructed to check sugar once a day.  Dx code:  E11.21    Dispense:  100 each    Refill:  3  . ONETOUCH DELICA LANCETS 50N MISC    Sig: 1 each by Does not apply route as directed. Use as instructed to  check sugar once a day.  Dx code:  E11.21    Dispense:  100 each    Refill:  3   Orders Placed This Encounter  Procedures  . POCT glycosylated hemoglobin (Hb A1C)    Follow up plan: Return in about 6 months (around 02/13/2018) for annual exam, prior fasting for blood work, medicare wellness visit.  [name], MD

## 2017-08-13 NOTE — Assessment & Plan Note (Signed)
Benign lesion after dentist evaluation.

## 2017-08-13 NOTE — Telephone Encounter (Signed)
plz check on insurance coverage for prolia for patient.

## 2017-08-13 NOTE — Assessment & Plan Note (Signed)
Will check into prolia for patient.

## 2017-08-13 NOTE — Assessment & Plan Note (Signed)
Marked improvement with healthy diet changes - congratulated. She did complete DSME and is now in prediabetes range.

## 2017-08-14 NOTE — Telephone Encounter (Signed)
Information has been submitted to pts insurance for verification of benefits. Awaiting response for coverage  

## 2017-08-18 ENCOUNTER — Other Ambulatory Visit: Payer: Self-pay | Admitting: Family Medicine

## 2017-08-19 ENCOUNTER — Other Ambulatory Visit: Payer: Self-pay | Admitting: Family Medicine

## 2017-08-19 DIAGNOSIS — F331 Major depressive disorder, recurrent, moderate: Secondary | ICD-10-CM

## 2017-08-19 NOTE — Telephone Encounter (Signed)
Last filled 07-22-17 #60 Last OV 08-13-17 Next OV 02-18-18  Forwarding to Allie Bossier in Dr Synthia Innocent absence

## 2017-08-19 NOTE — Telephone Encounter (Signed)
Refill sent to pharmacy. Reviewed documentation. No suspicious activity on PMP Aware.

## 2017-08-28 ENCOUNTER — Other Ambulatory Visit (INDEPENDENT_AMBULATORY_CARE_PROVIDER_SITE_OTHER): Payer: PPO

## 2017-08-28 DIAGNOSIS — M810 Age-related osteoporosis without current pathological fracture: Secondary | ICD-10-CM | POA: Diagnosis not present

## 2017-08-28 LAB — CALCIUM: CALCIUM: 9.6 mg/dL (ref 8.4–10.5)

## 2017-08-28 NOTE — Telephone Encounter (Signed)
Verification of benefits have been processed and an approval has been received for pts prolia injection. Pts estimated cost are appx $250. This is only an estimate and cannot be confirmed until benefits are paid. Please advise pt and schedule if needed. If scheduled, once the injection is received, pls contact me back with the date it was received so that I am able to update prolia folder. thanks  

## 2017-08-28 NOTE — Telephone Encounter (Signed)
Spoke to pt who is agreeable to charge; Ca lab and prolia injection scheduled.  

## 2017-09-02 ENCOUNTER — Ambulatory Visit (INDEPENDENT_AMBULATORY_CARE_PROVIDER_SITE_OTHER): Payer: PPO

## 2017-09-02 DIAGNOSIS — M810 Age-related osteoporosis without current pathological fracture: Secondary | ICD-10-CM

## 2017-09-02 MED ORDER — DENOSUMAB 60 MG/ML ~~LOC~~ SOSY
60.0000 mg | PREFILLED_SYRINGE | Freq: Once | SUBCUTANEOUS | Status: AC
  Administered 2017-09-02: 60 mg via SUBCUTANEOUS

## 2017-09-09 ENCOUNTER — Encounter: Payer: Self-pay | Admitting: Emergency Medicine

## 2017-09-09 ENCOUNTER — Other Ambulatory Visit: Payer: Self-pay

## 2017-09-09 ENCOUNTER — Emergency Department
Admission: EM | Admit: 2017-09-09 | Discharge: 2017-09-09 | Disposition: A | Discharge status: Home / Self Care | Payer: PPO | Attending: Emergency Medicine | Admitting: Emergency Medicine

## 2017-09-09 ENCOUNTER — Emergency Department: Payer: PPO

## 2017-09-09 DIAGNOSIS — E1122 Type 2 diabetes mellitus with diabetic chronic kidney disease: Secondary | ICD-10-CM | POA: Insufficient documentation

## 2017-09-09 DIAGNOSIS — R51 Headache: Secondary | ICD-10-CM | POA: Diagnosis not present

## 2017-09-09 DIAGNOSIS — S60512A Abrasion of left hand, initial encounter: Secondary | ICD-10-CM | POA: Diagnosis not present

## 2017-09-09 DIAGNOSIS — Y9389 Activity, other specified: Secondary | ICD-10-CM | POA: Diagnosis not present

## 2017-09-09 DIAGNOSIS — Z7902 Long term (current) use of antithrombotics/antiplatelets: Secondary | ICD-10-CM | POA: Diagnosis not present

## 2017-09-09 DIAGNOSIS — S60511A Abrasion of right hand, initial encounter: Secondary | ICD-10-CM | POA: Insufficient documentation

## 2017-09-09 DIAGNOSIS — Z79899 Other long term (current) drug therapy: Secondary | ICD-10-CM | POA: Diagnosis not present

## 2017-09-09 DIAGNOSIS — W01198A Fall on same level from slipping, tripping and stumbling with subsequent striking against other object, initial encounter: Secondary | ICD-10-CM | POA: Diagnosis not present

## 2017-09-09 DIAGNOSIS — Y929 Unspecified place or not applicable: Secondary | ICD-10-CM | POA: Insufficient documentation

## 2017-09-09 DIAGNOSIS — Z7982 Long term (current) use of aspirin: Secondary | ICD-10-CM | POA: Diagnosis not present

## 2017-09-09 DIAGNOSIS — I2510 Atherosclerotic heart disease of native coronary artery without angina pectoris: Secondary | ICD-10-CM | POA: Diagnosis not present

## 2017-09-09 DIAGNOSIS — Z23 Encounter for immunization: Secondary | ICD-10-CM | POA: Diagnosis not present

## 2017-09-09 DIAGNOSIS — E039 Hypothyroidism, unspecified: Secondary | ICD-10-CM | POA: Diagnosis not present

## 2017-09-09 DIAGNOSIS — S0990XA Unspecified injury of head, initial encounter: Secondary | ICD-10-CM | POA: Diagnosis not present

## 2017-09-09 DIAGNOSIS — Y998 Other external cause status: Secondary | ICD-10-CM | POA: Insufficient documentation

## 2017-09-09 DIAGNOSIS — N183 Chronic kidney disease, stage 3 (moderate): Secondary | ICD-10-CM | POA: Insufficient documentation

## 2017-09-09 DIAGNOSIS — W19XXXA Unspecified fall, initial encounter: Secondary | ICD-10-CM

## 2017-09-09 DIAGNOSIS — S0181XA Laceration without foreign body of other part of head, initial encounter: Secondary | ICD-10-CM | POA: Diagnosis not present

## 2017-09-09 MED ORDER — LIDOCAINE HCL (PF) 1 % IJ SOLN
INTRAMUSCULAR | Status: AC
  Administered 2017-09-09: 5 mL
  Filled 2017-09-09: qty 5

## 2017-09-09 MED ORDER — TETANUS-DIPHTH-ACELL PERTUSSIS 5-2.5-18.5 LF-MCG/0.5 IM SUSP
0.5000 mL | Freq: Once | INTRAMUSCULAR | Status: AC
  Administered 2017-09-09: 0.5 mL via INTRAMUSCULAR
  Filled 2017-09-09: qty 0.5

## 2017-09-09 NOTE — ED Provider Notes (Signed)
[facility] Emergency Department Provider Note  ____________________________________________  Time seen: Approximately 4:29 PM  I have reviewed the triage vital signs and the nursing notes.   HISTORY  Chief Complaint Fall    HPI Kathleen Williamson is a 77 y.o. female that presents to the emergency department for evaluation after fall. She tripped on concrete. No loss of consciousness. She doesn't remember ambulance driving up to her but she remembers getting into the ambulance. She has a laceration to her forehead and abrasions to her hands. She originally had a minor headache but this resolved after coming to the emergency department. She does not feel like anything is broken. She denies any pain currently. She is not on any blood thinners. Fall was witnessed. No dizziness, neck pain, CP, SOB, palpitations.    Past Medical History:  Diagnosis Date  . Arthritis    fingers  . CAD (coronary artery disease) 2003   MI s/p 5v CABG  . Carotid stenosis    [place]  . CKD (chronic kidney disease) stage 3, GFR 30-59 ml/min (HCC)   . Dermatomyositis [facility])    saw Dr Estanislado Pandy, improved on its own  . Diabetes mellitus without complication ([place])   . History of chicken pox   . History of measles   . History of migraine none since 2003  . HLD (hyperlipidemia)   . Hypothyroidism   . MDD (major depressive disorder), recurrent episode, moderate ([place])    h/o SI after lost husband unexpectedly 2015  . Microscopic colitis 05/2014   by colonoscopy, lymphocytic, zoloft related  . Osteoporosis    DEXA 03/2013 T -3.1, DEXA 10//2016 -2.9 hip, -2.2 spine  . Pancreatic cyst    Dr Ralene Ok, no f/u needed  . Prediabetes 2014  . Subclavian steal syndrome   . Urine incontinence     Patient Active Problem List   Diagnosis Date Noted  . Tongue lesion 02/12/2017  . Osteoarthritis 12/19/2016  . Complicated grief 72/53/6644  . Cloudy urine 05/09/2016  . Health maintenance  examination 02/07/2016  . Baker's cyst of knee 08/01/2015  . Prediabetes 02/11/2015  . Subclavian steal syndrome   . Medicare annual wellness visit, subsequent 01/31/2015  . Advanced care planning/counseling discussion 01/31/2015  . Carotid stenosis 01/31/2015  . Vertebral artery stenosis/occlusion 01/31/2015  . CKD (chronic kidney disease) stage 3, GFR 30-59 ml/min (HCC) 01/21/2015  . Microscopic colitis 10/26/2014  . Osteoporosis   . Hypothyroidism   . Dermatomyositis ([place])   . MDD (major depressive disorder), recurrent episode, moderate ([place]) 01/12/2014  . Atherosclerosis of native coronary artery of native heart without angina pectoris 12/13/2013  . Pure hypercholesterolemia 12/13/2013  . Pseudocyst of pancreas 08/29/2011    Past Surgical History:  Procedure Laterality Date  . APPENDECTOMY  1997   with hysterectomy  . [place]   lower  . BREAST LUMPECTOMY Left 1977   benign  . COLONOSCOPY WITH PROPOFOL N/A 05/23/2014   microscopic colitis - Garlan Fair, MD  . CORONARY ARTERY BYPASS GRAFT  2003   x 5 v  . ESOPHAGOGASTRODUODENOSCOPY (EGD) WITH PROPOFOL N/A 05/23/2014   WNL Garlan Fair, MD  . [place] Right 1990's  . TOOTH EXTRACTION  yrs ago  . TOTAL ABDOMINAL HYSTERECTOMY W/ BILATERAL SALPINGOOPHORECTOMY  1997   endometriosis    Prior to Admission medications   Medication Sig Start Date End Date Taking? Authorizing Provider  aspirin EC 81 MG tablet Take 81 mg by mouth at  bedtime.    [provider]  Biotin 5 MG CAPS Take 1 capsule by mouth daily.    [provider]  Calcium Carbonate-Vitamin D 600-400 MG-UNIT tablet Take 1 tablet by mouth 2 (two) times daily.    [provider]  citalopram (CELEXA) 20 MG tablet TAKE 1 TABLET BY MOUTH EVERY DAY 08/18/17   [name], MD  clonazePAM (KLONOPIN) 0.5 MG tablet TAKE 1 TABLET BY MOUTH TWICE A DAY AS NEEDED 08/19/17   Pleas Koch, NP  Coenzyme Q10 (COQ10) 100 MG  CAPS Take 1 capsule by mouth at bedtime.     [provider]  ezetimibe (ZETIA) 10 MG tablet Take 1 tablet (10 mg total) by mouth daily. 07/01/17 09/29/17  Burtis Junes, NP  Glucosamine-Chondroitin (MOVE FREE PO) Take 2 tablets by mouth daily.    [provider]  glucose blood (ONETOUCH VERIO) test strip 1 each by Other route as needed for other. Use as instructed to check sugar once a day.  Dx code:  E11.21 08/13/17   [name], MD  levothyroxine (SYNTHROID, LEVOTHROID) 50 MCG tablet Take 1 tablet (50 mcg total) by mouth daily. 05/20/17   [name], MD  loperamide (IMODIUM A-D) 2 MG tablet Take 2 mg by mouth every morning.    [provider]  mirtazapine (REMERON) 15 MG tablet TAKE 1 TABLET BY MOUTH EVERYDAY AT BEDTIME 03/02/17   [name], MD  nitroGLYCERIN (NITROLINGUAL) 0.4 MG/SPRAY spray Place 1 spray under the tongue every 5 (five) minutes as needed. 05/23/16   Jerline Pain, MD  [facility] DELICA LANCETS 14H MISC 1 each by Does not apply route as directed. Use as instructed to check sugar once a day.  Dx code:  E11.21 08/13/17   [name], MD  pravastatin (PRAVACHOL) 80 MG tablet Take 1 tablet (80 mg total) by mouth every evening. 07/01/17   Burtis Junes, NP  venlafaxine XR (EFFEXOR-XR) 75 MG 24 hr capsule TAKE 1 CAPSULE (75 MG TOTAL) BY MOUTH DAILY WITH BREAKFAST. 08/18/17   [name], MD  vitamin B-12 (CYANOCOBALAMIN) 1000 MCG tablet Take 1,000 mcg by mouth daily.    [provider]    Allergies Sulfa antibiotics and Crestor [rosuvastatin calcium]  Family History  Problem Relation Age of Onset  . CAD Father 69  . CAD Mother 15  . Diabetes Mother   . Rheum arthritis Mother   . Cancer Maternal Aunt        breast  . CAD Other        strong paternal side  . Stroke Neg Hx     Social History Social History   Tobacco Use  . Smoking status: Never Smoker  . Smokeless tobacco: Never Used  Substance Use Topics  .  Alcohol use: No  . Drug use: No     Review of Systems  Cardiovascular: No chest pain. Respiratory: No SOB. Gastrointestinal: No abdominal pain.  No nausea, no vomiting.  Musculoskeletal: Negative for musculoskeletal pain. Skin: Negative for rash. Positive for lacerations and ecchymosis. Neurological: Negative for numbness or tingling. See HPI.    ____________________________________________   PHYSICAL EXAM:  VITAL SIGNS: ED Triage Vitals  Enc Vitals Group     BP 09/09/17 1448 (!) 141/74     Pulse Rate 09/09/17 1448 (!) 57     Resp 09/09/17 1448 16     Temp 09/09/17 1448 98.4 F (36.9 C)     Temp Source 09/09/17 1448 Oral  SpO2 09/09/17 1448 99 %     Weight 09/09/17 1446 143 lb (64.9 kg)     Height 09/09/17 1446 5\' 4"  (1.626 m)     Head Circumference --      Peak Flow --      Pain Score 09/09/17 1446 8     Pain Loc --      Pain Edu? --      Excl. in [place]? --      Constitutional: Alert and oriented. Well appearing and in no acute distress. Eyes: Conjunctivae are normal. PERRL. EOMI. Head: 1/2 cm shallow well approximated laceration to left eye brow.  ENT:      Ears:      Nose: No congestion/rhinnorhea.      Mouth/Throat: Mucous membranes are moist. Swelling to right top lip without laceration.  Neck: No stridor.  Cardiovascular: Normal rate, regular rhythm.  Good peripheral circulation. Respiratory: Normal respiratory effort without tachypnea or retractions. Lungs CTAB. Good air entry to the bases with no decreased or absent breath sounds. Gastrointestinal: Bowel sounds 4 quadrants. Soft and nontender to palpation. No guarding or rigidity. No palpable masses. No distention.  Musculoskeletal: Full range of motion to all extremities. No gross deformities appreciated. Neurologic:  Normal speech and language. No gross focal neurologic deficits are appreciated.  Skin:  Skin is warm, dry. Abrasion to left hand.   ____________________________________________    LABS (all labs ordered are listed, but only abnormal results are displayed)  Labs Reviewed - No data to display ____________________________________________  EKG   ____________________________________________  RADIOLOGY Robinette Haines, personally viewed and evaluated these images (plain radiographs) as part of my medical decision making, as well as reviewing the written report by the radiologist.  Ct Head Wo Contrast  Result Date: 09/09/2017 CLINICAL DATA:  77 year old female with fall and acute head injury today. Headache and ataxia. Initial encounter. EXAM: CT HEAD WITHOUT CONTRAST TECHNIQUE: Contiguous axial images were obtained from the base of the skull through the vertex without intravenous contrast. COMPARISON:  None. FINDINGS: Brain: No evidence of acute infarction, hemorrhage, hydrocephalus, extra-axial collection or mass lesion/mass effect. Mild atrophy noted. Moderate bilateral periventricular and subcortical white matter hypodensities are nonspecific but may represent chronic small-vessel white matter ischemic changes. Vascular: Atherosclerotic calcifications identified. Skull: Normal. Negative for fracture or focal lesion. Sinuses/Orbits: No acute finding. Other: None. IMPRESSION: 1. No evidence of acute intracranial abnormality. 2. Moderate bilateral nonspecific white matter hypodensities-likely chronic small-vessel white matter ischemic changes. Electronically Signed   By: Margarette Canada M.D.   On: 09/09/2017 15:12    ____________________________________________    PROCEDURES  Procedure(s) performed:    Procedures  LACERATION REPAIR Performed by: Laban Emperor  Consent: Verbal consent obtained.  Consent given by: patient  Prepped and Draped in normal sterile fashion  Wound explored: No foreign bodies   Laceration Location: left eyebrow  Laceration Length: 1/2 cm  Anesthesia: None  Local anesthetic: lidocaine 1% withotu epinephrine  Anesthetic total: 1  ml  Irrigation method: syringe  Amount of cleaning: 563ml normal saline  Skin closure: 450 nylon  Number of sutures: 2  Technique: Simple interrupted  Patient tolerance: Patient tolerated the procedure well with no immediate complications.   Medications  lidocaine (PF) (XYLOCAINE) 1 % injection (5 mLs  Given 09/09/17 1723)  Tdap (BOOSTRIX) injection 0.5 mL (0.5 mLs Intramuscular Given 09/09/17 1722)     ____________________________________________   INITIAL IMPRESSION / ASSESSMENT AND PLAN / ED COURSE  Pertinent labs & imaging results that  were available during my care of the patient were reviewed by me and considered in my medical decision making (see chart for details).  Review of the [place] CSRS was performed in accordance of the [place] prior to dispensing any controlled drugs.   Patient presented to the emergency department for evaluation after fall.  Vital signs and exam are reassuring.  CT head is negative for acute abnormalities.  Laceration on face was repaired with stitches.  Patient denies any pain currently.  Tetanus shot was updated. Patient is to follow up with PCP as directed. Patient is given ED precautions to return to the ED for any worsening or new symptoms.     ____________________________________________  FINAL CLINICAL IMPRESSION(S) / ED DIAGNOSES  Final diagnoses:  Fall, initial encounter  Laceration of forehead, initial encounter      NEW MEDICATIONS STARTED DURING THIS VISIT:  ED Discharge Orders    None          This chart was dictated using voice recognition software/Dragon. Despite best efforts to proofread, errors can occur which can change the meaning. Any change was purely unintentional.    Laban Emperor, PA-C 09/09/17 1950    Hinda Kehr, MD 09/10/17 [phone]

## 2017-09-09 NOTE — ED Triage Notes (Signed)
Pt comes into the ED via [facility] c/o fall where her friend states she tripped over an uneven part of the concrete.  Patient fell and hit her head but doesn't remember what happened.  Small laceration present on the face.  Patient in NAD and is neurologically intact at this time. Patient has stable VS and denies any blood thinner use.

## 2017-09-09 NOTE — ED Notes (Signed)
See triage note  Presents s/p fall   States she tripped on uneven concrete  Possible LOC

## 2017-09-14 ENCOUNTER — Other Ambulatory Visit: Payer: Self-pay | Admitting: Family Medicine

## 2017-09-14 NOTE — Telephone Encounter (Signed)
Electronic refill request Last office visit 08/13/17 Last refill 03/02/17 #30/6

## 2017-09-15 ENCOUNTER — Encounter: Payer: Self-pay | Admitting: Family Medicine

## 2017-09-15 ENCOUNTER — Ambulatory Visit (INDEPENDENT_AMBULATORY_CARE_PROVIDER_SITE_OTHER): Payer: PPO | Admitting: Family Medicine

## 2017-09-15 VITALS — BP 130/62 | HR 72 | Temp 97.9°F | Ht 65.75 in | Wt 146.5 lb

## 2017-09-15 DIAGNOSIS — F331 Major depressive disorder, recurrent, moderate: Secondary | ICD-10-CM | POA: Diagnosis not present

## 2017-09-15 DIAGNOSIS — S0181XD Laceration without foreign body of other part of head, subsequent encounter: Secondary | ICD-10-CM

## 2017-09-15 DIAGNOSIS — S0181XA Laceration without foreign body of other part of head, initial encounter: Secondary | ICD-10-CM

## 2017-09-15 DIAGNOSIS — F40298 Other specified phobia: Secondary | ICD-10-CM | POA: Insufficient documentation

## 2017-09-15 DIAGNOSIS — R296 Repeated falls: Secondary | ICD-10-CM | POA: Insufficient documentation

## 2017-09-15 HISTORY — DX: Laceration without foreign body of other part of head, initial encounter: S01.81XA

## 2017-09-15 NOTE — Assessment & Plan Note (Signed)
Recent mechanical fall. She has access to PT at [facility]. I did recommend she check on balance training, fall prevention programs available. Encouraged aquatic exercise.

## 2017-09-15 NOTE — Assessment & Plan Note (Signed)
Chronic, deteriorated after fall. Support offered. No med changes at this time. Encouraged continue to pursue new job opportunities at [employer]

## 2017-09-15 NOTE — Patient Instructions (Addendum)
Ask Ronalee Belts PT - about balance training exercises.  I do recommend swimming exercises. Look into swimming classes.  Return in 2 days for suture removal.

## 2017-09-15 NOTE — Progress Notes (Signed)
BP 130/62 (BP Location: Left Arm, Patient Position: Sitting, Cuff Size: Normal)   Pulse 72   Temp 97.9 F (36.6 C) (Oral)   Ht 5' 5.75" (1.67 m)   Wt 146 lb 8 oz (66.5 kg)   LMP  (LMP Unknown)   SpO2 98%   BMI 23.83 kg/m    CC: f/u ER visit for fall Subjective:    Patient ID: Kathleen Williamson, female    DOB: 03/11/1941, 77 y.o.   MRN: [id number]  HPI: Kathleen Williamson is a 77 y.o. female presenting on 09/15/2017 for Suture / Staple Removal (Placed by [facility] ED on 09/09/17 in the forehead due to a fall.  Wants to discuss depression since the fall. )   Recent ER visit 09/09/2017 after she suffered [place] mall after tripping on some broken concrete, landed on hands, L knee and L eyebrow. Suffered laceration to forehead and abrasions to her hand. CT head was unrevealing. Forehead laceration closed with 2 simple interrupted sutures. She does not remember ambulance trip or large portion of ER visit.   She did lose consciousness. She has not had any residual headaches, nausea or vision changes. She does note increased fear of falling since latest fall, this was first fall in last 1-12 yrs.   Depression - compliant with celexa 20mg  daily, effexor XR 75mg  daily, remeron 15mg  at bedtime, and klonopin 0.5mg  BID PRN. Has continued seeing counselor [facility]).   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   Relevant past medical, surgical, family and social history reviewed and updated as indicated. Interim medical history since our last visit reviewed. Allergies and medications reviewed and updated. Outpatient Medications Prior to Visit  Medication Sig Dispense Refill  . aspirin EC 81 MG tablet Take 81 mg by mouth at bedtime.    . Biotin 5 MG CAPS Take 1 capsule by mouth daily.    . Calcium Carbonate-Vitamin D 600-400 MG-UNIT tablet Take 1 tablet by mouth 2 (two) times daily.    . citalopram (CELEXA) 20 MG tablet TAKE 1 TABLET BY MOUTH EVERY DAY 90  tablet 1  . clonazePAM (KLONOPIN) 0.5 MG tablet TAKE 1 TABLET BY MOUTH TWICE A DAY AS NEEDED 60 tablet 0  . Coenzyme Q10 (COQ10) 100 MG CAPS Take 1 capsule by mouth at bedtime.     Marland Kitchen ezetimibe (ZETIA) 10 MG tablet Take 1 tablet (10 mg total) by mouth daily. 90 tablet 3  . Glucosamine-Chondroitin (MOVE FREE PO) Take 2 tablets by mouth daily.    Marland Kitchen glucose blood (ONETOUCH VERIO) test strip 1 each by Other route as needed for other. Use as instructed to check sugar once a day.  Dx code:  E11.21 100 each 3  . levothyroxine (SYNTHROID, LEVOTHROID) 50 MCG tablet Take 1 tablet (50 mcg total) by mouth daily. 90 tablet 1  . loperamide (IMODIUM A-D) 2 MG tablet Take 2 mg by mouth every morning.    . mirtazapine (REMERON) 15 MG tablet TAKE 1 TABLET BY MOUTH EVERYDAY AT BEDTIME 30 tablet 6  . nitroGLYCERIN (NITROLINGUAL) 0.4 MG/SPRAY spray Place 1 spray under the tongue every 5 (five) minutes as needed. 12 g prn  . ONETOUCH DELICA LANCETS 93Y MISC 1 each by Does not apply route as directed. Use as instructed to check sugar once a day.  Dx code:  E11.21 100 each 3  . pravastatin (PRAVACHOL) 80 MG tablet Take 1 tablet (80 mg total)  by mouth every evening. 90 tablet 3  . venlafaxine XR (EFFEXOR-XR) 75 MG 24 hr capsule TAKE 1 CAPSULE (75 MG TOTAL) BY MOUTH DAILY WITH BREAKFAST. 90 capsule 1  . vitamin B-12 (CYANOCOBALAMIN) 1000 MCG tablet Take 1,000 mcg by mouth daily.     No facility-administered medications prior to visit.      Per HPI unless specifically indicated in ROS section below Review of Systems     Objective:    BP 130/62 (BP Location: Left Arm, Patient Position: Sitting, Cuff Size: Normal)   Pulse 72   Temp 97.9 F (36.6 C) (Oral)   Ht 5' 5.75" (1.67 m)   Wt 146 lb 8 oz (66.5 kg)   LMP  (LMP Unknown)   SpO2 98%   BMI 23.83 kg/m   Wt Readings from Last 3 Encounters:  09/15/17 146 lb 8 oz (66.5 kg)  09/09/17 143 lb (64.9 kg)  08/13/17 145 lb 8 oz (66 kg)    Physical Exam    Constitutional: She appears well-developed and well-nourished. No distress.  HENT:  Head: Head is with laceration.    2cm laceration well approximated, sutures in place along with dermabond L black eye  Skin: Skin is warm and dry.  Nursing note and vitals reviewed.   Depression screen [facility] 2/9 09/15/2017 05/19/2017 02/10/2017 12/19/2016 11/18/2016  Decreased Interest 1 3 3 1 3   Down, Depressed, Hopeless 2 1 3 1 1   PHQ - 2 Score 3 4 6 2 4   Altered sleeping 0 0 0 0 0  Tired, decreased energy 2 3 0 1 2  Change in appetite 2 3 1 2 2   Feeling bad or failure about yourself  1 0 0 1 2  Trouble concentrating 1 0 0 0 0  Moving slowly or fidgety/restless 1 0 0 0 0  Suicidal thoughts 0 1 1 1  0  PHQ-9 Score 10 11 8 7 10   Difficult doing work/chores - Not difficult at all Not difficult at all Not difficult at all Somewhat difficult   GAD 7 : Generalized Anxiety Score 09/15/2017  Nervous, Anxious, on Edge 2  Control/stop worrying 2  Worry too much - different things 2  Trouble relaxing 3  Restless 2  Easily annoyed or irritable 2  Afraid - awful might happen 2  Total GAD 7 Score 15        Assessment & Plan:   Problem List Items Addressed This Visit    Fear of falling    Recent mechanical fall. She has access to PT at [facility]. I did recommend she check on balance training, fall prevention programs available. Encouraged aquatic exercise.       Forehead laceration - Primary    L eyebrow. Healed well. Attempted to remove sutures however was only able to remove one due to dermabond making sutures adherent to skin. Tried to remove dermabond with alcohol, however only 1 suture was entirely removed. I did cut suture open. RTC 2 days re attempt.       MDD (major depressive disorder), recurrent episode, moderate (HCC)    Chronic, deteriorated after fall. Support offered. No med changes at this time. Encouraged continue to pursue new job opportunities at [employer]          No orders of the defined  types were placed in this encounter.  No orders of the defined types were placed in this encounter.   Follow up plan: Return in about 2 days (around 09/17/2017).  [name], MD

## 2017-09-15 NOTE — Assessment & Plan Note (Addendum)
L eyebrow. Healed well. Attempted to remove sutures however was only able to remove one due to dermabond making sutures adherent to skin. Tried to remove dermabond with alcohol, however only 1 suture was entirely removed. I did cut suture open. RTC 2 days re attempt.

## 2017-09-17 ENCOUNTER — Ambulatory Visit: Payer: PPO | Admitting: Family Medicine

## 2017-09-18 ENCOUNTER — Other Ambulatory Visit: Payer: Self-pay | Admitting: Primary Care

## 2017-09-18 DIAGNOSIS — F331 Major depressive disorder, recurrent, moderate: Secondary | ICD-10-CM

## 2017-09-18 NOTE — Telephone Encounter (Signed)
Eprescribed.

## 2017-09-18 NOTE — Telephone Encounter (Signed)
Clonazepam Last filled:  08/19/17, #60 Last OV:  09/15/17 Next OV:  09/24/17

## 2017-09-24 ENCOUNTER — Encounter: Payer: Self-pay | Admitting: Family Medicine

## 2017-09-24 ENCOUNTER — Ambulatory Visit (INDEPENDENT_AMBULATORY_CARE_PROVIDER_SITE_OTHER): Payer: PPO | Admitting: Family Medicine

## 2017-09-24 VITALS — BP 124/72 | HR 78 | Temp 97.7°F | Ht 66.0 in | Wt 147.2 lb

## 2017-09-24 DIAGNOSIS — S0181XD Laceration without foreign body of other part of head, subsequent encounter: Secondary | ICD-10-CM | POA: Diagnosis not present

## 2017-09-24 DIAGNOSIS — S20212A Contusion of left front wall of thorax, initial encounter: Secondary | ICD-10-CM

## 2017-09-24 NOTE — Patient Instructions (Addendum)
You are doing wonderfully! I think you had rib bruise (contusion) that can take 4-6 weeks to heal, ok as long as each day getting some better.  May continue tylenol for discomfort.  May carry pillow or cushion for comfort (when coughing or sneezing etc)  Return as needed.

## 2017-09-24 NOTE — Progress Notes (Signed)
BP 124/72 (BP Location: Left Arm, Patient Position: Sitting, Cuff Size: Normal)   Pulse 78   Temp 97.7 F (36.5 C) (Oral)   Ht 5\' 6"  (1.676 m)   Wt 147 lb 4 oz (66.8 kg)   LMP  (LMP Unknown)   SpO2 98%   BMI 23.77 kg/m    CC: hosp f/u visit Subjective:    Patient ID: Kathleen Williamson, female    DOB: 1941/01/26, 77 y.o.   MRN: [id number]  HPI: Kathleen Williamson is a 77 y.o. female presenting on 09/24/2017 for Hospitalization Follow-up (Seen at [facility] ED on 09/09/17 due to a fall.)   See prior note for details. Already seen for ER f/u visit.  Overall doing much better!  Ongoing rib pain - fell onto L chest. Ribs were not previously evaluated. Using tylenol 500mg  1-2 times daily for discomfort. No pain with deep inspiration.  Needs to get glasses fixed.  Tooth was chipped as well - had reassuring dental evaluation. S/p tooth filing.   S/p CABG 2003.  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   Relevant past medical, surgical, family and social history reviewed and updated as indicated. Interim medical history since our last visit reviewed. Allergies and medications reviewed and updated. Outpatient Medications Prior to Visit  Medication Sig Dispense Refill  . aspirin EC 81 MG tablet Take 81 mg by mouth at bedtime.    . Biotin 5 MG CAPS Take 1 capsule by mouth daily.    . Calcium Carbonate-Vitamin D 600-400 MG-UNIT tablet Take 1 tablet by mouth 2 (two) times daily.    . citalopram (CELEXA) 20 MG tablet TAKE 1 TABLET BY MOUTH EVERY DAY 90 tablet 1  . clonazePAM (KLONOPIN) 0.5 MG tablet TAKE 1 TABLET BY MOUTH TWICE A DAY AS NEEDED 60 tablet 0  . Coenzyme Q10 (COQ10) 100 MG CAPS Take 1 capsule by mouth at bedtime.     Marland Kitchen ezetimibe (ZETIA) 10 MG tablet Take 1 tablet (10 mg total) by mouth daily. 90 tablet 3  . Glucosamine-Chondroitin (MOVE FREE PO) Take 2 tablets by mouth daily.    Marland Kitchen glucose blood (ONETOUCH VERIO) test strip 1 each by Other route as needed for other. Use as instructed to check  sugar once a day.  Dx code:  E11.21 100 each 3  . levothyroxine (SYNTHROID, LEVOTHROID) 50 MCG tablet Take 1 tablet (50 mcg total) by mouth daily. 90 tablet 1  . loperamide (IMODIUM A-D) 2 MG tablet Take 2 mg by mouth every morning.    . mirtazapine (REMERON) 15 MG tablet TAKE 1 TABLET BY MOUTH EVERYDAY AT BEDTIME 30 tablet 5  . nitroGLYCERIN (NITROLINGUAL) 0.4 MG/SPRAY spray Place 1 spray under the tongue every 5 (five) minutes as needed. 12 g prn  . ONETOUCH DELICA LANCETS 86P MISC 1 each by Does not apply route as directed. Use as instructed to check sugar once a day.  Dx code:  E11.21 100 each 3  . pravastatin (PRAVACHOL) 80 MG tablet Take 1 tablet (80 mg total) by mouth every evening. 90 tablet 3  . venlafaxine XR (EFFEXOR-XR) 75 MG 24 hr capsule TAKE 1 CAPSULE (75 MG TOTAL) BY MOUTH DAILY WITH BREAKFAST. 90 capsule 1  . vitamin B-12 (CYANOCOBALAMIN) 1000 MCG tablet Take 1,000 mcg by mouth daily.     No facility-administered medications prior to visit.      Per HPI unless specifically indicated in ROS section below Review of Systems     Objective:  BP 124/72 (BP Location: Left Arm, Patient Position: Sitting, Cuff Size: Normal)   Pulse 78   Temp 97.7 F (36.5 C) (Oral)   Ht 5\' 6"  (1.676 m)   Wt 147 lb 4 oz (66.8 kg)   LMP  (LMP Unknown)   SpO2 98%   BMI 23.77 kg/m   Wt Readings from Last 3 Encounters:  09/24/17 147 lb 4 oz (66.8 kg)  09/15/17 146 lb 8 oz (66.5 kg)  09/09/17 143 lb (64.9 kg)    Physical Exam  Constitutional: She appears well-developed and well-nourished. No distress.  HENT:  Head: Normocephalic and atraumatic.  Mouth/Throat: Oropharynx is clear and moist. No oropharyngeal exudate.  Well healed L eyebrow laceration, with resolving ecchymosis below L eye Mild tenderness to palpation L eyebrow  Cardiovascular: Normal rate, regular rhythm and normal heart sounds.  No murmur heard. Pulmonary/Chest: Effort normal and breath sounds normal. No respiratory  distress. She has no wheezes. She has no rales. She exhibits tenderness (tender to palpation left lateral lower ribcage and mild discomfort to anterior ribcage below breast).  Musculoskeletal: She exhibits no edema.  Nursing note and vitals reviewed.  Results for orders placed or performed in visit on 08/28/17  Calcium  Result Value Ref Range   Calcium 9.6 8.4 - 10.5 mg/dL      Assessment & Plan:   Problem List Items Addressed This Visit    Rib contusion, left, initial encounter    New - has developed since fall. Not previously evaluated. Benign exam, lungs clear. Anticipate rib contusion. Supportive care reviewed, continue tylenol.       Forehead laceration - Primary    This has healed remarkably well.  Also with improving eyebrow contusion           No orders of the defined types were placed in this encounter.  No orders of the defined types were placed in this encounter.   Follow up plan: Return if symptoms worsen or fail to improve.  [name], MD

## 2017-09-24 NOTE — Assessment & Plan Note (Addendum)
This has healed remarkably well.  Also with improving eyebrow contusion

## 2017-09-24 NOTE — Assessment & Plan Note (Signed)
New - has developed since fall. Not previously evaluated. Benign exam, lungs clear. Anticipate rib contusion. Supportive care reviewed, continue tylenol.

## 2017-10-19 ENCOUNTER — Other Ambulatory Visit: Payer: Self-pay | Admitting: Family Medicine

## 2017-10-19 DIAGNOSIS — F331 Major depressive disorder, recurrent, moderate: Secondary | ICD-10-CM

## 2017-10-19 NOTE — Telephone Encounter (Signed)
Name of Medication: clonazePAM (KLONOPIN) 0.5 MG tablet  Name of [facility] or Written Date and Quantity: 09/18/2017 #60  Last Office Visit and Type: 09/24/2017 acute appt  Next Office Visit and Type: 02/18/2018 Annual Part 2  Last Controlled Substance Agreement Date: 12/19/2017  Last UDS: 12/19/2016

## 2017-10-20 NOTE — Telephone Encounter (Signed)
Eprescribed.

## 2017-11-17 ENCOUNTER — Other Ambulatory Visit: Payer: Self-pay | Admitting: Family Medicine

## 2017-11-17 DIAGNOSIS — F331 Major depressive disorder, recurrent, moderate: Secondary | ICD-10-CM

## 2017-11-17 NOTE — Telephone Encounter (Signed)
Name of Medication: Clonazepam Name of Pharmacy: [facility] or Written Date and Quantity: 10/20/17, #60 Last Office Visit and Type: 09/24/17, acute Next Office Visit and Type: 02/18/18, CPE Last Controlled Substance Agreement Date: 12/19/16 Last UDS: 12/19/16

## 2017-11-19 DIAGNOSIS — H2513 Age-related nuclear cataract, bilateral: Secondary | ICD-10-CM | POA: Diagnosis not present

## 2017-11-19 LAB — HM DIABETES EYE EXAM

## 2017-11-19 NOTE — Telephone Encounter (Signed)
Eprescribed.

## 2017-11-23 ENCOUNTER — Encounter: Payer: Self-pay | Admitting: Family Medicine

## 2017-12-17 ENCOUNTER — Other Ambulatory Visit: Payer: Self-pay | Admitting: Family Medicine

## 2017-12-17 DIAGNOSIS — F331 Major depressive disorder, recurrent, moderate: Secondary | ICD-10-CM

## 2017-12-17 NOTE — Telephone Encounter (Signed)
Name of Medication: Clonazepam Name of Pharmacy: [facility] or Written Date and Quantity: 11/19/17, #60 Last Office Visit and Type: 09/24/17, acute Next Office Visit and Type: 02/18/18, CPE Last Controlled Substance Agreement Date: 12/19/16  Last UDS: 12/19/16

## 2017-12-18 NOTE — Telephone Encounter (Signed)
Eprescribed.

## 2018-01-18 ENCOUNTER — Other Ambulatory Visit: Payer: Self-pay | Admitting: Family Medicine

## 2018-01-18 DIAGNOSIS — F331 Major depressive disorder, recurrent, moderate: Secondary | ICD-10-CM

## 2018-01-18 NOTE — Telephone Encounter (Signed)
Name of Medication: Clonazepam Name of Pharmacy: [facility] or Written Date and Quantity: 12/18/17, #60 Last Office Visit and Type: 09/24/17, acute Next Office Visit and Type: 02/18/18, CPE Last Controlled Substance Agreement Date: 12/19/16 Last UDS: 12/19/16

## 2018-01-20 NOTE — Telephone Encounter (Signed)
Spoke with pt notifying her refill was sent.  Pt verbalizes understanding and expresses her thanks.

## 2018-01-20 NOTE — Telephone Encounter (Signed)
plz notify this was sent in today. 

## 2018-01-20 NOTE — Telephone Encounter (Signed)
Patient calling to check status of medication refill. States that she is currently out of this medication and needs to have this. Would like to know if this could be sent today before the pharmacy closes at 6pm? Please advise.

## 2018-01-29 ENCOUNTER — Telehealth: Payer: Self-pay | Admitting: *Deleted

## 2018-01-29 NOTE — Telephone Encounter (Signed)
Information has been submitted to pts insurance for verification of benefits. Awaiting response for coverage  

## 2018-02-15 ENCOUNTER — Other Ambulatory Visit: Payer: Self-pay | Admitting: Family Medicine

## 2018-02-16 ENCOUNTER — Other Ambulatory Visit: Payer: Self-pay | Admitting: Family Medicine

## 2018-02-16 ENCOUNTER — Ambulatory Visit (INDEPENDENT_AMBULATORY_CARE_PROVIDER_SITE_OTHER): Payer: PPO

## 2018-02-16 VITALS — BP 110/60 | HR 70 | Temp 98.0°F | Ht 62.75 in | Wt 150.2 lb

## 2018-02-16 DIAGNOSIS — N183 Chronic kidney disease, stage 3 unspecified: Secondary | ICD-10-CM

## 2018-02-16 DIAGNOSIS — E039 Hypothyroidism, unspecified: Secondary | ICD-10-CM

## 2018-02-16 DIAGNOSIS — Z0000 Encounter for general adult medical examination without abnormal findings: Secondary | ICD-10-CM

## 2018-02-16 DIAGNOSIS — E7800 Pure hypercholesterolemia, unspecified: Secondary | ICD-10-CM | POA: Diagnosis not present

## 2018-02-16 DIAGNOSIS — R7303 Prediabetes: Secondary | ICD-10-CM | POA: Diagnosis not present

## 2018-02-16 DIAGNOSIS — M810 Age-related osteoporosis without current pathological fracture: Secondary | ICD-10-CM

## 2018-02-16 DIAGNOSIS — K52839 Microscopic colitis, unspecified: Secondary | ICD-10-CM

## 2018-02-16 LAB — CBC WITH DIFFERENTIAL/PLATELET
Basophils Absolute: 0.1 10*3/uL (ref 0.0–0.1)
Basophils Relative: 1 % (ref 0.0–3.0)
EOS PCT: 2.9 % (ref 0.0–5.0)
Eosinophils Absolute: 0.2 10*3/uL (ref 0.0–0.7)
HEMATOCRIT: 37.8 % (ref 36.0–46.0)
HEMOGLOBIN: 12.6 g/dL (ref 12.0–15.0)
LYMPHS ABS: 2.3 10*3/uL (ref 0.7–4.0)
LYMPHS PCT: 33.6 % (ref 12.0–46.0)
MCHC: 33.4 g/dL (ref 30.0–36.0)
MCV: 86.1 fl (ref 78.0–100.0)
MONOS PCT: 7 % (ref 3.0–12.0)
Monocytes Absolute: 0.5 10*3/uL (ref 0.1–1.0)
Neutro Abs: 3.8 10*3/uL (ref 1.4–7.7)
Neutrophils Relative %: 55.5 % (ref 43.0–77.0)
Platelets: 226 10*3/uL (ref 150.0–400.0)
RBC: 4.39 Mil/uL (ref 3.87–5.11)
RDW: 14 % (ref 11.5–15.5)
WBC: 6.9 10*3/uL (ref 4.0–10.5)

## 2018-02-16 LAB — MICROALBUMIN / CREATININE URINE RATIO
CREATININE, U: 52.3 mg/dL
Microalb Creat Ratio: 1.3 mg/g (ref 0.0–30.0)

## 2018-02-16 LAB — LIPID PANEL
CHOLESTEROL: 147 mg/dL (ref 0–200)
HDL: 38.4 mg/dL — ABNORMAL LOW (ref >39.00)
NonHDL: 108.92
Total CHOL/HDL Ratio: 4
Triglycerides: 210 mg/dL — ABNORMAL HIGH (ref 0.0–149.0)
VLDL: 42 mg/dL — AB (ref 0.0–40.0)

## 2018-02-16 LAB — LDL CHOLESTEROL, DIRECT: Direct LDL: 81 mg/dL

## 2018-02-16 LAB — COMPREHENSIVE METABOLIC PANEL
ALK PHOS: 44 U/L (ref 39–117)
ALT: 13 U/L (ref 0–35)
AST: 14 U/L (ref 0–37)
Albumin: 4.2 g/dL (ref 3.5–5.2)
BUN: 19 mg/dL (ref 6–23)
CO2: 25 mEq/L (ref 19–32)
CREATININE: 1.64 mg/dL — AB (ref 0.40–1.20)
Calcium: 9.8 mg/dL (ref 8.4–10.5)
Chloride: 105 mEq/L (ref 96–112)
GFR: 32.36 mL/min — ABNORMAL LOW (ref >60.00)
GLUCOSE: 139 mg/dL — AB (ref 70–99)
POTASSIUM: 4.8 meq/L (ref 3.5–5.1)
SODIUM: 138 meq/L (ref 135–145)
TOTAL PROTEIN: 7 g/dL (ref 6.0–8.3)
Total Bilirubin: 0.6 mg/dL (ref 0.2–1.2)

## 2018-02-16 LAB — T4, FREE: Free T4: 0.84 ng/dL (ref 0.60–1.60)

## 2018-02-16 LAB — VITAMIN D 25 HYDROXY (VIT D DEFICIENCY, FRACTURES): VITD: 49.99 ng/mL (ref 30.00–100.00)

## 2018-02-16 LAB — TSH: TSH: 1.69 u[IU]/mL (ref 0.35–4.50)

## 2018-02-16 LAB — HEMOGLOBIN A1C: Hgb A1c MFr Bld: 7 % — ABNORMAL HIGH (ref 4.6–6.5)

## 2018-02-16 NOTE — Telephone Encounter (Signed)
Verification of benefits have been processed and an approval has been received for pts prolia injection. Pts estimated cost are appx $230. This is only an estimate and cannot be confirmed until benefits are paid. Please advise pt and schedule if needed. If scheduled, once the injection is received, pls contact me back with the date it was received so that I am able to update prolia folder. thanks  

## 2018-02-16 NOTE — Patient Instructions (Signed)
Kathleen Williamson , Thank you for taking time to come for your Medicare Wellness Visit. I appreciate your ongoing commitment to your health goals. Please review the following plan we discussed and let me know if I can assist you in the future.   These are the goals we discussed: Goals    . Patient Stated     Starting 02/16/2018, I will continue to take medications as prescribed.        This is a list of the screening recommended for you and due dates:  Health Maintenance  Topic Date Due  . Mammogram  05/14/2018*  . Urine Protein Check  02/17/2019  . DTaP/Tdap/Td vaccine (2 - Td) 09/10/2027  . Tetanus Vaccine  09/10/2027  . Flu Shot  Completed  . DEXA scan (bone density measurement)  Completed  . Pneumonia vaccines  Completed  *Topic was postponed. The date shown is not the original due date.   Preventive Care for Adults  A healthy lifestyle and preventive care can promote health and wellness. Preventive health guidelines for adults include the following key practices.  . A routine yearly physical is a good way to check with your health care provider about your health and preventive screening. It is a chance to share any concerns and updates on your health and to receive a thorough exam.  . Visit your dentist for a routine exam and preventive care every 6 months. Brush your teeth twice a day and floss once a day. Good oral hygiene prevents tooth decay and gum disease.  . The frequency of eye exams is based on your age, health, family medical history, use  of contact lenses, and other factors. Follow your health care provider's recommendations for frequency of eye exams.  . Eat a healthy diet. Foods like vegetables, fruits, whole grains, low-fat dairy products, and lean protein foods contain the nutrients you need without too many calories. Decrease your intake of foods high in solid fats, added sugars, and salt. Eat the right amount of calories for you. Get information about a proper diet  from your health care provider, if necessary.  . Regular physical exercise is one of the most important things you can do for your health. Most adults should get at least 150 minutes of moderate-intensity exercise (any activity that increases your heart rate and causes you to sweat) each week. In addition, most adults need muscle-strengthening exercises on 2 or more days a week.  Silver Sneakers may be a benefit available to you. To determine eligibility, you may visit the website: www.silversneakers.com or contact program at [phone] Mon-Fri between 8AM-8PM.   . Maintain a healthy weight. The body mass index (BMI) is a screening tool to identify possible weight problems. It provides an estimate of body fat based on height and weight. Your health care provider can find your BMI and can help you achieve or maintain a healthy weight.   For adults 20 years and older: ? A BMI below 18.5 is considered underweight. ? A BMI of 18.5 to 24.9 is normal. ? A BMI of 25 to 29.9 is considered overweight. ? A BMI of 30 and above is considered obese.   . Maintain normal blood lipids and cholesterol levels by exercising and minimizing your intake of saturated fat. Eat a balanced diet with plenty of fruit and vegetables. Blood tests for lipids and cholesterol should begin at age 49 and be repeated every 5 years. If your lipid or cholesterol levels are high, you are over 50,  or you are at high risk for heart disease, you may need your cholesterol levels checked more frequently. Ongoing high lipid and cholesterol levels should be treated with medicines if diet and exercise are not working.  . If you smoke, find out from your health care provider how to quit. If you do not use tobacco, please do not start.  . If you choose to drink alcohol, please do not consume more than 2 drinks per day. One drink is considered to be 12 ounces (355 mL) of beer, 5 ounces (148 mL) of wine, or 1.5 ounces (44 mL) of liquor.  .  If you are 7-29 years old, ask your health care provider if you should take aspirin to prevent strokes.  . Use sunscreen. Apply sunscreen liberally and repeatedly throughout the day. You should seek shade when your shadow is shorter than you. Protect yourself by wearing long sleeves, pants, a wide-brimmed hat, and sunglasses year round, whenever you are outdoors.  . Once a month, do a whole body skin exam, using a mirror to look at the skin on your back. Tell your health care provider of new moles, moles that have irregular borders, moles that are larger than a pencil eraser, or moles that have changed in shape or color.

## 2018-02-16 NOTE — Telephone Encounter (Signed)
Spoke to pt and advised. Agreeable to out of pocket expense and prolia inj scheduled.

## 2018-02-16 NOTE — Telephone Encounter (Signed)
Lm on pts vm requesting a call back 

## 2018-02-17 ENCOUNTER — Encounter: Payer: Self-pay | Admitting: Family Medicine

## 2018-02-17 NOTE — Assessment & Plan Note (Signed)
Preventative protocols reviewed and updated unless pt declined. Discussed healthy diet and lifestyle.  

## 2018-02-17 NOTE — Progress Notes (Signed)
BP 120/68 (BP Location: Right Arm, Patient Position: Sitting, Cuff Size: Normal)   Pulse 81   Temp 97.8 F (36.6 C) (Oral)   Ht 5' 2.75" (1.594 m)   Wt 152 lb 12 oz (69.3 kg)   LMP  (LMP Unknown)   SpO2 96%   BMI 27.27 kg/m    CC: CPE Subjective:    Patient ID: Kathleen Williamson, female    DOB: 09-06-1940, 77 y.o.   MRN: [id number]  HPI: Kathleen Williamson is a 77 y.o. female presenting on 02/18/2018 for Annual Exam (Pt 2.)   Volunteering at [facility].  Annia Belt earlier this week for medicare wellness visit. Note reviewed.    Preventative: Colonoscopy 05/23/2014 microscopic colitis attributed to zoloft (Dr Wynetta Emery at [place]) Breast cancer screening - yearly, due December. I never received 2018 mammogram.  Well woman with pap 03/2014 ([facility]) - will not return. S/p complete hysterectomy with B oophorectomy for endometriosis.  Lung cancer screening - never smoker DEXA scan - 01/2015 -2.9 hip, -2.2 spine. Does not want bisphosphonate. She has not been on treatment for this. She does take calcium and vitamin D regularly. Dexa 2019 - T score -2.6 (improvement).  Flu shot yearly  Td - 2008, Tdap 2019  Pneumovax2008, prevnar 2017 zostavax - 2011 shingrix - completed 2019 Advanced directive discussion - has completed through attorney. Will bring [name] copy. Very difficult to complete since husband's passing. Thinking of getting [place] involved.  Seat belt use discussed  Sunscreen use discussed, no changing moles on skin Non smoker Alcohol - none Dentist q6 mo Eye exam yearly  Lives alone at [facility]. Husband of 49 yrs died suddenly 18-Oct-2013.  No children, no siblings.  Close friends Bethena Roys and Ace Gins nearby, cousin in [place]. Activity: going to [place] Diet: poor - lots of frozen dinners, eating out  Relevant past medical, surgical, family and social history reviewed and updated as indicated. Interim medical history since our last visit  reviewed. Allergies and medications reviewed and updated. Outpatient Medications Prior to Visit  Medication Sig Dispense Refill  . aspirin EC 81 MG tablet Take 81 mg by mouth at bedtime.    . Biotin 5 MG CAPS Take 1 capsule by mouth daily.    . Calcium Carbonate-Vitamin D 600-400 MG-UNIT tablet Take 1 tablet by mouth 2 (two) times daily.    . citalopram (CELEXA) 20 MG tablet TAKE 1 TABLET BY MOUTH EVERY DAY 90 tablet 1  . clonazePAM (KLONOPIN) 0.5 MG tablet TAKE 1 TABLET BY MOUTH TWICE A DAY AS NEEDED 60 tablet 3  . Coenzyme Q10 (COQ10) 100 MG CAPS Take 1 capsule by mouth at bedtime.     . Glucosamine-Chondroitin (MOVE FREE PO) Take 2 tablets by mouth daily.    Marland Kitchen glucose blood (ONETOUCH VERIO) test strip 1 each by Other route as needed for other. Use as instructed to check sugar once a day.  Dx code:  E11.21 100 each 3  . levothyroxine (SYNTHROID, LEVOTHROID) 50 MCG tablet Take 1 tablet (50 mcg total) by mouth daily. 90 tablet 1  . loperamide (IMODIUM A-D) 2 MG tablet Take 2 mg by mouth every morning.    . mirtazapine (REMERON) 15 MG tablet TAKE 1 TABLET BY MOUTH EVERYDAY AT BEDTIME 30 tablet 5  . nitroGLYCERIN (NITROLINGUAL) 0.4 MG/SPRAY spray Place 1 spray under the tongue every 5 (five) minutes as needed. 12 g prn  . ONETOUCH DELICA LANCETS 56Y MISC 1 each by Does  not apply route as directed. Use as instructed to check sugar once a day.  Dx code:  E11.21 100 each 3  . pravastatin (PRAVACHOL) 80 MG tablet Take 1 tablet (80 mg total) by mouth every evening. 90 tablet 3  . venlafaxine XR (EFFEXOR-XR) 75 MG 24 hr capsule TAKE 1 CAPSULE (75 MG TOTAL) BY MOUTH DAILY WITH BREAKFAST. 90 capsule 0  . vitamin B-12 (CYANOCOBALAMIN) 1000 MCG tablet Take 1,000 mcg by mouth daily.    Marland Kitchen ezetimibe (ZETIA) 10 MG tablet Take 1 tablet (10 mg total) by mouth daily. 90 tablet 3   No facility-administered medications prior to visit.      Per HPI unless specifically indicated in ROS section below Review of  Systems  Constitutional: Negative for activity change, appetite change, chills, fatigue, fever and unexpected weight change.  HENT: Negative for hearing loss.   Eyes: Negative for visual disturbance.  Respiratory: Negative for cough, chest tightness, shortness of breath and wheezing.   Cardiovascular: Negative for chest pain, palpitations and leg swelling.  Gastrointestinal: Negative for abdominal distention, abdominal pain, blood in stool, constipation, diarrhea, nausea and vomiting.  Genitourinary: Negative for difficulty urinating and hematuria.  Musculoskeletal: Negative for arthralgias, myalgias and neck pain.  Skin: Negative for rash.  Neurological: Negative for dizziness, seizures, syncope and headaches.  Hematological: Negative for adenopathy. Does not bruise/bleed easily.  Psychiatric/Behavioral: Positive for dysphoric mood. The patient is not nervous/anxious.        Objective:    BP 120/68 (BP Location: Right Arm, Patient Position: Sitting, Cuff Size: Normal)   Pulse 81   Temp 97.8 F (36.6 C) (Oral)   Ht 5' 2.75" (1.594 m)   Wt 152 lb 12 oz (69.3 kg)   LMP  (LMP Unknown)   SpO2 96%   BMI 27.27 kg/m   Wt Readings from Last 3 Encounters:  02/18/18 152 lb 12 oz (69.3 kg)  02/16/18 150 lb 4 oz (68.2 kg)  09/24/17 147 lb 4 oz (66.8 kg)    Physical Exam  Constitutional: She is oriented to person, place, and time. She appears well-developed and well-nourished. No distress.  HENT:  Head: Normocephalic and atraumatic.  Right Ear: Hearing, tympanic membrane, external ear and ear canal normal.  Left Ear: Hearing, tympanic membrane, external ear and ear canal normal.  Nose: Nose normal.  Mouth/Throat: Uvula is midline, oropharynx is clear and moist and mucous membranes are normal. No oropharyngeal exudate, posterior oropharyngeal edema or posterior oropharyngeal erythema.  Eyes: Pupils are equal, round, and reactive to light. Conjunctivae and EOM are normal. No scleral  icterus.  Neck: Normal range of motion. Neck supple.  Cardiovascular: Normal rate, regular rhythm, normal heart sounds and intact distal pulses.  No murmur heard. Pulses:      Radial pulses are 2+ on the right side, and 2+ on the left side.  Pulmonary/Chest: Effort normal and breath sounds normal. No respiratory distress. She has no wheezes. She has no rales.  Abdominal: Soft. Bowel sounds are normal. She exhibits no distension and no mass. There is no tenderness. There is no rebound and no guarding.  Musculoskeletal: Normal range of motion. She exhibits no edema.  Lymphadenopathy:    She has no cervical adenopathy.  Neurological: She is alert and oriented to person, place, and time.  CN grossly intact, station and gait intact  Skin: Skin is warm and dry. No rash noted.  Psychiatric: She has a normal mood and affect. Her behavior is normal. Judgment and thought content  normal.  Nursing note and vitals reviewed.  Results for orders placed or performed in visit on 02/16/18  CBC with Differential/Platelet  Result Value Ref Range   WBC 6.9 4.0 - 10.5 K/uL   RBC 4.39 3.87 - 5.11 Mil/uL   Hemoglobin 12.6 12.0 - 15.0 g/dL   HCT 37.8 36.0 - 46.0 %   MCV 86.1 78.0 - 100.0 fl   MCHC 33.4 30.0 - 36.0 g/dL   RDW 14.0 11.5 - 15.5 %   Platelets 226.0 150.0 - 400.0 K/uL   Neutrophils Relative % 55.5 43.0 - 77.0 %   Lymphocytes Relative 33.6 12.0 - 46.0 %   Monocytes Relative 7.0 3.0 - 12.0 %   Eosinophils Relative 2.9 0.0 - 5.0 %   Basophils Relative 1.0 0.0 - 3.0 %   Neutro Abs 3.8 1.4 - 7.7 K/uL   Lymphs Abs 2.3 0.7 - 4.0 K/uL   Monocytes Absolute 0.5 0.1 - 1.0 K/uL   Eosinophils Absolute 0.2 0.0 - 0.7 K/uL   Basophils Absolute 0.1 0.0 - 0.1 K/uL  T4, free  Result Value Ref Range   Free T4 0.84 0.60 - 1.60 ng/dL  Hemoglobin A1c  Result Value Ref Range   Hgb A1c MFr Bld 7.0 (H) 4.6 - 6.5 %  TSH  Result Value Ref Range   TSH 1.69 0.35 - 4.50 uIU/mL  Comprehensive metabolic panel   Result Value Ref Range   Sodium 138 135 - 145 mEq/L   Potassium 4.8 3.5 - 5.1 mEq/L   Chloride 105 96 - 112 mEq/L   CO2 25 19 - 32 mEq/L   Glucose, Bld 139 (H) 70 - 99 mg/dL   BUN 19 6 - 23 mg/dL   Creatinine, Ser 1.64 (H) 0.40 - 1.20 mg/dL   Total Bilirubin 0.6 0.2 - 1.2 mg/dL   Alkaline Phosphatase 44 39 - 117 U/L   AST 14 0 - 37 U/L   ALT 13 0 - 35 U/L   Total Protein 7.0 6.0 - 8.3 g/dL   Albumin 4.2 3.5 - 5.2 g/dL   Calcium 9.8 8.4 - 10.5 mg/dL   GFR 32.36 (L) >60.00 mL/min  Lipid panel  Result Value Ref Range   Cholesterol 147 0 - 200 mg/dL   Triglycerides 210.0 (H) 0.0 - 149.0 mg/dL   HDL 38.40 (L) >39.00 mg/dL   VLDL 42.0 (H) 0.0 - 40.0 mg/dL   Total CHOL/HDL Ratio 4    NonHDL 108.92   VITAMIN D 25 Hydroxy (Vit-D Deficiency, Fractures)  Result Value Ref Range   VITD 49.99 30.00 - 100.00 ng/mL  Microalbumin/Creatinine Ratio, Urine  Result Value Ref Range   Microalb, Ur <0.7 0.0 - 1.9 mg/dL   Creatinine,U 52.3 mg/dL   Microalb Creat Ratio 1.3 0.0 - 30.0 mg/g  LDL cholesterol, direct  Result Value Ref Range   Direct LDL 81.0 mg/dL      Assessment & Plan:   Problem List Items Addressed This Visit    Vertebral artery stenosis/occlusion    Update carotid US      Relevant Orders   VAS US CAROTID   Pure hypercholesterolemia    Chronic, stable. Continue pravastatin and zeita.  The 10-year ASCVD risk score Mikey Bussing DC Jr., et al., 2013) is: 27.5%   Values used to calculate the score:     Age: 27 years     Sex: Female     Is Non-Hispanic African American: No     Diabetic: Yes     Tobacco  smoker: No     Systolic Blood Pressure: 115 mmHg     Is BP treated: No     HDL Cholesterol: 38.4 mg/dL     Total Cholesterol: 147 mg/dL       Osteoporosis    Continue cal/vit D and regular weight bearing exercises.  She agreed to prolia injection but I don't see where this was ever done. Will check with patient next visit.       MDD (major depressive disorder), recurrent  episode, moderate ([place])    Continues effexor, celexa, remeron, and klonopin. Stable period. Desires to continue current regimen. She continues participating in activities outside of [place] (volunteering at [facility], regular exercise at [employer])      Hypothyroidism    Chronic, stable. Continue levothyroxine 81mcg daily.       Health maintenance examination - Primary    Preventative protocols reviewed and updated unless pt declined. Discussed healthy diet and lifestyle.       Diabetes mellitus type 2, controlled, with complications ([place])    Mild deterioration noted. Encouraged renewed efforts at low carb/sugar diabetic diet. Will reassess at f/u visit.       CKD (chronic kidney disease) stage 3, GFR 30-59 ml/min (HCC)    Deterioration noted. Merits close monitoring. Recheck next visit. Encouraged good hydration status.       Carotid stenosis    Update [name].       Relevant Orders   VAS US CAROTID   Atherosclerosis of native coronary artery of native heart without angina pectoris    Cardiologist -[place].      Advanced care planning/counseling discussion    Advanced directive discussion - has completed through attorney. Will bring [name] copy. Very difficult to complete since husband's passing. Thinking of getting [place] involved.           No orders of the defined types were placed in this encounter.  No orders of the defined types were placed in this encounter.   Follow up plan: Return in about 6 months (around 08/19/2018) for follow up visit.  [name], MD

## 2018-02-18 ENCOUNTER — Encounter: Payer: Self-pay | Admitting: Family Medicine

## 2018-02-18 ENCOUNTER — Ambulatory Visit (INDEPENDENT_AMBULATORY_CARE_PROVIDER_SITE_OTHER): Payer: PPO | Admitting: Family Medicine

## 2018-02-18 VITALS — BP 120/68 | HR 81 | Temp 97.8°F | Ht 62.75 in | Wt 152.8 lb

## 2018-02-18 DIAGNOSIS — I2510 Atherosclerotic heart disease of native coronary artery without angina pectoris: Secondary | ICD-10-CM | POA: Diagnosis not present

## 2018-02-18 DIAGNOSIS — F331 Major depressive disorder, recurrent, moderate: Secondary | ICD-10-CM | POA: Diagnosis not present

## 2018-02-18 DIAGNOSIS — M810 Age-related osteoporosis without current pathological fracture: Secondary | ICD-10-CM

## 2018-02-18 DIAGNOSIS — Z0000 Encounter for general adult medical examination without abnormal findings: Secondary | ICD-10-CM | POA: Diagnosis not present

## 2018-02-18 DIAGNOSIS — N183 Chronic kidney disease, stage 3 unspecified: Secondary | ICD-10-CM

## 2018-02-18 DIAGNOSIS — Z7189 Other specified counseling: Secondary | ICD-10-CM | POA: Diagnosis not present

## 2018-02-18 DIAGNOSIS — E7800 Pure hypercholesterolemia, unspecified: Secondary | ICD-10-CM

## 2018-02-18 DIAGNOSIS — I6502 Occlusion and stenosis of left vertebral artery: Secondary | ICD-10-CM

## 2018-02-18 DIAGNOSIS — E039 Hypothyroidism, unspecified: Secondary | ICD-10-CM

## 2018-02-18 DIAGNOSIS — E118 Type 2 diabetes mellitus with unspecified complications: Secondary | ICD-10-CM | POA: Diagnosis not present

## 2018-02-18 DIAGNOSIS — I6523 Occlusion and stenosis of bilateral carotid arteries: Secondary | ICD-10-CM

## 2018-02-18 NOTE — Assessment & Plan Note (Signed)
Advanced directive discussion - has completed through attorney. Will bring [name] copy. Very difficult to complete since husband's passing. Thinking of getting [place] involved.

## 2018-02-18 NOTE — Patient Instructions (Addendum)
Bring us copy of your advanced directive  Return in 6 months for labs and follow up visit.  Work on low sugar low carb diabetic diet over next 6 months.  We will check carotid ultrasound for follow up.  You are doing well today. Health Maintenance, Female Adopting a healthy lifestyle and getting preventive care can go a long way to promote health and wellness. Talk with your health care provider about what schedule of regular examinations is right for you. This is a good chance for you to check in with your provider about disease prevention and staying healthy. In between checkups, there are plenty of things you can do on your own. Experts have done a lot of research about which lifestyle changes and preventive measures are most likely to keep you healthy. Ask your health care provider for more information. Weight and diet Eat a healthy diet  Be sure to include plenty of vegetables, fruits, low-fat dairy products, and lean protein.  Do not eat a lot of foods high in solid fats, added sugars, or salt.  Get regular exercise. This is one of the most important things you can do for your health. ? Most adults should exercise for at least 150 minutes each week. The exercise should increase your heart rate and make you sweat (moderate-intensity exercise). ? Most adults should also do strengthening exercises at least twice a week. This is in addition to the moderate-intensity exercise.  Maintain a healthy weight  Body mass index (BMI) is a measurement that can be used to identify possible weight problems. It estimates body fat based on height and weight. Your health care provider can help determine your BMI and help you achieve or maintain a healthy weight.  For females 20 years of age and older: ? A BMI below 18.5 is considered underweight. ? A BMI of 18.5 to 24.9 is normal. ? A BMI of 25 to 29.9 is considered overweight. ? A BMI of 30 and above is considered obese.  Watch levels of cholesterol  and blood lipids  You should start having your blood tested for lipids and cholesterol at 76 years of age, then have this test every 5 years.  You may need to have your cholesterol levels checked more often if: ? Your lipid or cholesterol levels are high. ? You are older than 76 years of age. ? You are at high risk for heart disease.  Cancer screening Lung Cancer  Lung cancer screening is recommended for adults 55-80 years old who are at high risk for lung cancer because of a history of smoking.  A yearly low-dose CT scan of the lungs is recommended for people who: ? Currently smoke. ? Have quit within the past 15 years. ? Have at least a 30-pack-year history of smoking. A pack year is smoking an average of one pack of cigarettes a day for 1 year.  Yearly screening should continue until it has been 15 years since you quit.  Yearly screening should stop if you develop a health problem that would prevent you from having lung cancer treatment.  Breast Cancer  Practice breast self-awareness. This means understanding how your breasts normally appear and feel.  It also means doing regular breast self-exams. Let your health care provider know about any changes, no matter how small.  If you are in your 20s or 30s, you should have a clinical breast exam (CBE) by a health care provider every 1-3 years as part of a regular health exam.    If you are 40 or older, have a CBE every year. Also consider having a breast X-ray (mammogram) every year.  If you have a family history of breast cancer, talk to your health care provider about genetic screening.  If you are at high risk for breast cancer, talk to your health care provider about having an MRI and a mammogram every year.  Breast cancer gene (BRCA) assessment is recommended for women who have family members with BRCA-related cancers. BRCA-related cancers include: ? Breast. ? Ovarian. ? Tubal. ? Peritoneal cancers.  Results of the  assessment will determine the need for genetic counseling and BRCA1 and BRCA2 testing.  Cervical Cancer Your health care provider may recommend that you be screened regularly for cancer of the pelvic organs (ovaries, uterus, and vagina). This screening involves a pelvic examination, including checking for microscopic changes to the surface of your cervix (Pap test). You may be encouraged to have this screening done every 3 years, beginning at age 5.  For women ages 67-65, health care providers may recommend pelvic exams and Pap testing every 3 years, or they may recommend the Pap and pelvic exam, combined with testing for human papilloma virus (HPV), every 5 years. Some types of HPV increase your risk of cervical cancer. Testing for HPV may also be done on women of any age with unclear Pap test results.  Other health care providers may not recommend any screening for nonpregnant women who are considered low risk for pelvic cancer and who do not have symptoms. Ask your health care provider if a screening pelvic exam is right for you.  If you have had past treatment for cervical cancer or a condition that could lead to cancer, you need Pap tests and screening for cancer for at least 20 years after your treatment. If Pap tests have been discontinued, your risk factors (such as having a new sexual partner) need to be reassessed to determine if screening should resume. Some women have medical problems that increase the chance of getting cervical cancer. In these cases, your health care provider may recommend more frequent screening and Pap tests.  Colorectal Cancer  This type of cancer can be detected and often prevented.  Routine colorectal cancer screening usually begins at 76 years of age and continues through 76 years of age.  Your health care provider may recommend screening at an earlier age if you have risk factors for colon cancer.  Your health care provider may also recommend using home test  kits to check for hidden blood in the stool.  A small camera at the end of a tube can be used to examine your colon directly (sigmoidoscopy or colonoscopy). This is done to check for the earliest forms of colorectal cancer.  Routine screening usually begins at age 29.  Direct examination of the colon should be repeated every 5-10 years through 76 years of age. However, you may need to be screened more often if early forms of precancerous polyps or small growths are found.  Skin Cancer  Check your skin from head to toe regularly.  Tell your health care provider about any new moles or changes in moles, especially if there is a change in a mole's shape or color.  Also tell your health care provider if you have a mole that is larger than the size of a pencil eraser.  Always use sunscreen. Apply sunscreen liberally and repeatedly throughout the day.  Protect yourself by wearing long sleeves, pants, a wide-brimmed hat, and  sunglasses whenever you are outside.  Heart disease, diabetes, and high blood pressure  High blood pressure causes heart disease and increases the risk of stroke. High blood pressure is more likely to develop in: ? People who have blood pressure in the high end of the normal range (130-139/85-89 mm Hg). ? People who are overweight or obese. ? People who are African American.  If you are 18-39 years of age, have your blood pressure checked every 3-5 years. If you are 40 years of age or older, have your blood pressure checked every year. You should have your blood pressure measured twice-once when you are at a hospital or clinic, and once when you are not at a hospital or clinic. Record the average of the two measurements. To check your blood pressure when you are not at a hospital or clinic, you can use: ? An automated blood pressure machine at a pharmacy. ? A home blood pressure monitor.  If you are between 55 years and 79 years old, ask your health care provider if you  should take aspirin to prevent strokes.  Have regular diabetes screenings. This involves taking a blood sample to check your fasting blood sugar level. ? If you are at a normal weight and have a low risk for diabetes, have this test once every three years after 76 years of age. ? If you are overweight and have a high risk for diabetes, consider being tested at a younger age or more often. Preventing infection Hepatitis B  If you have a higher risk for hepatitis B, you should be screened for this virus. You are considered at high risk for hepatitis B if: ? You were born in a country where hepatitis B is common. Ask your health care provider which countries are considered high risk. ? Your parents were born in a high-risk country, and you have not been immunized against hepatitis B (hepatitis B vaccine). ? You have HIV or AIDS. ? You use needles to inject street drugs. ? You live with someone who has hepatitis B. ? You have had sex with someone who has hepatitis B. ? You get hemodialysis treatment. ? You take certain medicines for conditions, including cancer, organ transplantation, and autoimmune conditions.  Hepatitis C  Blood testing is recommended for: ? Everyone born from 1945 through 1965. ? Anyone with known risk factors for hepatitis C.  Sexually transmitted infections (STIs)  You should be screened for sexually transmitted infections (STIs) including gonorrhea and chlamydia if: ? You are sexually active and are younger than 76 years of age. ? You are older than 76 years of age and your health care provider tells you that you are at risk for this type of infection. ? Your sexual activity has changed since you were last screened and you are at an increased risk for chlamydia or gonorrhea. Ask your health care provider if you are at risk.  If you do not have HIV, but are at risk, it may be recommended that you take a prescription medicine daily to prevent HIV infection. This is  called pre-exposure prophylaxis (PrEP). You are considered at risk if: ? You are sexually active and do not regularly use condoms or know the HIV status of your partner(s). ? You take drugs by injection. ? You are sexually active with a partner who has HIV.  Talk with your health care provider about whether you are at high risk of being infected with HIV. If you choose to begin PrEP, you   should first be tested for HIV. You should then be tested every 3 months for as long as you are taking PrEP. Pregnancy  If you are premenopausal and you may become pregnant, ask your health care provider about preconception counseling.  If you may become pregnant, take 400 to 800 micrograms (mcg) of folic acid every day.  If you want to prevent pregnancy, talk to your health care provider about birth control (contraception). Osteoporosis and menopause  Osteoporosis is a disease in which the bones lose minerals and strength with aging. This can result in serious bone fractures. Your risk for osteoporosis can be identified using a bone density scan.  If you are 80 years of age or older, or if you are at risk for osteoporosis and fractures, ask your health care provider if you should be screened.  Ask your health care provider whether you should take a calcium or vitamin D supplement to lower your risk for osteoporosis.  Menopause may have certain physical symptoms and risks.  Hormone replacement therapy may reduce some of these symptoms and risks. Talk to your health care provider about whether hormone replacement therapy is right for you. Follow these instructions at home:  Schedule regular health, dental, and eye exams.  Stay current with your immunizations.  Do not use any tobacco products including cigarettes, chewing tobacco, or electronic cigarettes.  If you are pregnant, do not drink alcohol.  If you are breastfeeding, limit how much and how often you drink alcohol.  Limit alcohol intake to  no more than 1 drink per day for nonpregnant women. One drink equals 12 ounces of beer, 5 ounces of wine, or 1 ounces of hard liquor.  Do not use street drugs.  Do not share needles.  Ask your health care provider for help if you need support or information about quitting drugs.  Tell your health care provider if you often feel depressed.  Tell your health care provider if you have ever been abused or do not feel safe at home. This information is not intended to replace advice given to you by your health care provider. Make sure you discuss any questions you have with your health care provider. Document Released: 10/14/2010 Document Revised: 09/06/2015 Document Reviewed: 01/02/2015 Elsevier Interactive Patient Education  [employer].

## 2018-02-18 NOTE — Progress Notes (Signed)
Subjective:   Kathleen Williamson is a 77 y.o. female who presents for Medicare Annual (Subsequent) preventive examination.  Review of Systems:  N/A Cardiac Risk Factors include: advanced age (>42men, >65 women);dyslipidemia     Objective:     Vitals: BP 110/60 (BP Location: Left Arm, Patient Position: Sitting, Cuff Size: Normal)   Pulse 70   Temp 98 F (36.7 C) (Oral)   Ht 5' 2.75" (1.594 m) Comment: no shoes  Wt 150 lb 4 oz (68.2 kg)   LMP  (LMP Unknown)   SpO2 95%   BMI 26.83 kg/m   Body mass index is 26.83 kg/m.  Advanced Directives 02/16/2018 09/09/2017 05/19/2017 02/10/2017 02/04/2016 05/19/2014 01/12/2014  Does Patient Have a Medical Advance Directive? Yes No Yes Yes Yes Yes No  Type of Paramedic of [place];Living will - [place];Living will [place];Living will [place];Living will [place];Living will -  Does patient want to make changes to medical advance directive? - - No - Patient declined - No - Patient declined - -  Copy of [place] in Chart? No - copy requested - - No - copy requested No - copy requested - -  Would patient like information on creating a medical advance directive? - No - Patient declined - - - - No - patient declined information    Tobacco Social History   Tobacco Use  Smoking Status Never Smoker  Smokeless Tobacco Never Used     Counseling given: No   Clinical Intake:  Pre-visit preparation completed: Yes  Pain : No/denies pain Pain Score: 0-No pain     Nutritional Status: BMI 25 -29 Overweight Nutritional Risks: None Diabetes: No  How often do you need to have someone help you when you read instructions, pamphlets, or other written materials from your doctor or pharmacy?: 1 - Never  Interpreter Needed?: No  Comments: pt is a widow and lives at [place] 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 Arthritis    fingers  . CAD (coronary artery disease) 2003   MI s/p 5v CABG  . Carotid stenosis    [place]  . CKD (chronic kidney disease) stage 3, GFR 30-59 ml/min (HCC)   . Dermatomyositis [facility])    saw Dr Estanislado Pandy, improved on its own  . Diabetes mellitus without complication ([place])   . History of chicken pox   . History of measles   . History of migraine none since 2003  . HLD (hyperlipidemia)   . Hypothyroidism   . MDD (major depressive disorder), recurrent episode, moderate ([place])    h/o SI after lost husband unexpectedly 2015  . Microscopic colitis 05/2014   by colonoscopy, lymphocytic, zoloft related  . Osteoporosis    DEXA 03/2013 T -3.1, DEXA 10//2016 -2.9 hip, -2.2 spine  . Pancreatic cyst    Dr Ralene Ok, no f/u needed  . Prediabetes 2014  . Subclavian steal syndrome   . Urine incontinence    Past Surgical History:  Procedure Laterality Date  . APPENDECTOMY  1997   with hysterectomy  . [place]   lower  . BREAST LUMPECTOMY Left 1977   benign  . COLONOSCOPY WITH PROPOFOL N/A 05/23/2014   microscopic colitis - Garlan Fair, MD  . CORONARY ARTERY BYPASS GRAFT  2003   x 5 v  . ESOPHAGOGASTRODUODENOSCOPY (EGD) WITH PROPOFOL N/A 05/23/2014  WNL Garlan Fair, MD  . ROTATOR CUFF REPAIR Right [phone]  . TOOTH EXTRACTION  yrs ago  . TOTAL ABDOMINAL HYSTERECTOMY W/ BILATERAL SALPINGOOPHORECTOMY  1997   endometriosis   Family History  Problem Relation Age of Onset  . CAD Father 45  . CAD Mother 69  . Diabetes Mother   . Rheum arthritis Mother   . Cancer Maternal Aunt        breast  . CAD Other        strong paternal side  . Stroke Neg Hx    Social History   Socioeconomic History  . Marital status: Widowed    Spouse name: Not on file  . Number of children: Not on file  . Years of education: Not on file  . Highest education level: Not on file  Occupational History  . Not on file  Social Needs  . Financial  resource strain: Not on file  . Food insecurity:    Worry: Not on file    Inability: Not on file  . Transportation needs:    Medical: Not on file    Non-medical: Not on file  Tobacco Use  . Smoking status: Never Smoker  . Smokeless tobacco: Never Used  Substance and Sexual Activity  . Alcohol use: No  . Drug use: No  . Sexual activity: Not Currently  Lifestyle  . Physical activity:    Days per week: Not on file    Minutes per session: Not on file  . Stress: Not on file  Relationships  . Social connections:    Talks on phone: Not on file    Gets together: Not on file    Attends religious service: Not on file    Active member of club or organization: Not on file    Attends meetings of clubs or organizations: Not on file    Relationship status: Not on file  Other Topics Concern  . Not on file  Social History Narrative   L[facility] at [facility].   Husband of 24 yrs died suddenly 10-30-2013.   No children, no siblings.    Close friends Bethena Roys and Ace Gins nearby, cousin in [place].   Activity: going to [facility]    Outpatient Encounter Medications as of 02/16/2018  Medication Sig  . aspirin EC 81 MG tablet Take 81 mg by mouth at bedtime.  . Biotin 5 MG CAPS Take 1 capsule by mouth daily.  . Calcium Carbonate-Vitamin D 600-400 MG-UNIT tablet Take 1 tablet by mouth 2 (two) times daily.  . citalopram (CELEXA) 20 MG tablet TAKE 1 TABLET BY MOUTH EVERY DAY  . clonazePAM (KLONOPIN) 0.5 MG tablet TAKE 1 TABLET BY MOUTH TWICE A DAY AS NEEDED  . Coenzyme Q10 (COQ10) 100 MG CAPS Take 1 capsule by mouth at bedtime.   . Glucosamine-Chondroitin (MOVE FREE PO) Take 2 tablets by mouth daily.  Marland Kitchen glucose blood (ONETOUCH VERIO) test strip 1 each by Other route as needed for other. Use as instructed to check sugar once a day.  Dx code:  E11.21  . levothyroxine (SYNTHROID, LEVOTHROID) 50 MCG tablet Take 1 tablet (50 mcg total) by mouth daily.  Marland Kitchen loperamide (IMODIUM A-D) 2 MG tablet Take 2 mg by mouth  every morning.  . mirtazapine (REMERON) 15 MG tablet TAKE 1 TABLET BY MOUTH EVERYDAY AT BEDTIME  . nitroGLYCERIN (NITROLINGUAL) 0.4 MG/SPRAY spray Place 1 spray under the tongue every 5 (five) minutes as needed.  Glory Rosebush DELICA LANCETS 88C MISC  1 each by Does not apply route as directed. Use as instructed to check sugar once a day.  Dx code:  E11.21  . pravastatin (PRAVACHOL) 80 MG tablet Take 1 tablet (80 mg total) by mouth every evening.  . vitamin B-12 (CYANOCOBALAMIN) 1000 MCG tablet Take 1,000 mcg by mouth daily.  . [DISCONTINUED] venlafaxine XR (EFFEXOR-XR) 75 MG 24 hr capsule TAKE 1 CAPSULE (75 MG TOTAL) BY MOUTH DAILY WITH BREAKFAST.  Marland Kitchen ezetimibe (ZETIA) 10 MG tablet Take 1 tablet (10 mg total) by mouth daily.   No facility-administered encounter medications on file as of 02/16/2018.     Activities of Daily Living In your present state of health, do you have any difficulty performing the following activities: 02/16/2018  Hearing? N  Vision? N  Difficulty concentrating or making decisions? Y  Walking or climbing stairs? N  Dressing or bathing? N  Doing errands, shopping? N  Preparing Food and eating ? N  Using the Toilet? N  In the past six months, have you accidently leaked urine? Y  Do you have problems with loss of bowel control? N  Managing your Medications? N  Managing your Finances? N  Housekeeping or managing your Housekeeping? N  Some recent data might be hidden    Patient Care Team: [name], MD as PCP - General (Family Medicine) Dingeldein, Remo Lipps, MD as Referring Physician (Ophthalmology)    Assessment:   This is a routine wellness examination for [place].   Hearing Screening   125Hz  250Hz  500Hz  1000Hz  2000Hz  3000Hz  4000Hz  6000Hz  8000Hz   Right ear:   40 0 40  0    Left ear:   40 40 40  40    Vision Screening Comments: December 2018 with Dr. Sandra Cockayne   Exercise Activities and Dietary recommendations Current Exercise Habits: The patient does not  participate in regular exercise at present, Exercise limited by: None identified  Goals    . Patient Stated     Starting 02/16/2018, I will continue to take medications as prescribed.        Fall Risk Fall Risk  02/16/2018 06/29/2017 06/22/2017 06/15/2017 05/19/2017  Falls in the past year? 1 No (No Data) No No  Comment fell after tripping over broken concrete on sidewalk; loss of consciousness and head trauma - no falls since last visit - -  Number falls in past yr: 0 - - - -  Injury with Fall? 1 - - - -   Depression Screen PHQ 2/9 Scores 02/16/2018 09/15/2017 05/19/2017 02/10/2017  PHQ - 2 Score 3 3 4 6   PHQ- 9 Score 6 10 11 8      Cognitive Function MMSE - Mini Mental State Exam 02/16/2018 02/10/2017 02/04/2016  Orientation to time 5 5 5   Orientation to Place 5 5 5   Registration 3 3 3   Attention/ Calculation 0 0 0  Recall 3 3 3   Language- name 2 objects 0 0 0  Language- repeat 1 1 1   Language- follow 3 step command 3 3 3   Language- read & follow direction 0 0 0  Write a sentence 0 0 0  Copy design 0 0 0  Total score 20 20 20      PLEASE NOTE: A Mini-Cog screen was completed. Maximum score is 20. A value of 0 denotes this part of [place] MMSE was not completed or the patient failed this part of the Mini-Cog screening.   Mini-Cog Screening Orientation to Time - Max 5 pts Orientation to Place - Max 5 pts  Registration - Max 3 pts Recall - Max 3 pts Language Repeat - Max 1 pts Language Follow 3 Step Command - Max 3 pts     Immunization History  Administered Date(s) Administered  . Influenza, High Dose Seasonal PF 12/18/2017  . Influenza,inj,Quad PF,6+ Mos 01/22/2015, 02/04/2016, 12/19/2016  . Influenza-Unspecified 04/01/2011  . Pneumococcal Conjugate-13 02/04/2016  . Pneumococcal-Unspecified 03/24/2007  . Td 03/24/2007, 09/05/2016  . Tdap 09/09/2017, 01/25/2018  . Zoster 04/14/2009  . Zoster Recombinat (Shingrix) 02/20/2017, 05/02/2017    Screening Tests Health  Maintenance  Topic Date Due  . MAMMOGRAM  05/14/2018 (Originally 03/17/2017)  . URINE MICROALBUMIN  02/17/2019  . DTaP/Tdap/Td (2 - Td) 09/10/2027  . TETANUS/TDAP  09/10/2027  . INFLUENZA VACCINE  Completed  . DEXA SCAN  Completed  . PNA vac Low Risk Adult  Completed       Plan:     I have personally reviewed, addressed, and noted the following in the patient's chart:  A. Medical and social history B. Use of alcohol, tobacco or illicit drugs  C. Current medications and supplements D. Functional ability and status E.  Nutritional status F.  Physical activity G. Advance directives H. List of other physicians I.  Hospitalizations, surgeries, and ER visits in previous 12 months J.  [place] to include hearing, vision, cognitive, depression L. Referrals and appointments - none  In addition, I have reviewed and discussed with patient certain preventive protocols, quality metrics, and best practice recommendations. A written personalized care plan for preventive services as well as general preventive health recommendations were provided to patient.  See attached scanned questionnaire for additional information.   Signed,   Lindell Noe, MHA, BS, LPN Health Coach

## 2018-02-18 NOTE — Progress Notes (Signed)
PCP notes:   Health maintenance:  Microalbumin - completed  Abnormal screenings:   Hearing - failed  Hearing Screening   125Hz  250Hz  500Hz  1000Hz  2000Hz  3000Hz  4000Hz  6000Hz  8000Hz   Right ear:   40 0 40  0    Left ear:   40 40 40  40     Fall risk -hx of single fall Fall Risk  02/16/2018 06/29/2017 06/22/2017 06/15/2017 05/19/2017  Falls in the past year? 1 No (No Data) No No  Comment fell after tripping over broken concrete on sidewalk; loss of consciousness and head trauma - no falls since last visit - -  Number falls in past yr: 0 - - - -  Injury with Fall? 1 - - - -   Depression score: 6 Depression screen [facility] 2/9 02/16/2018 09/15/2017 05/19/2017 02/10/2017 12/19/2016  Decreased Interest 2 1 3 3 1   Down, Depressed, Hopeless 1 2 1 3 1   PHQ - 2 Score 3 3 4 6 2   Altered sleeping 0 0 0 0 0  Tired, decreased energy 1 2 3  0 1  Change in appetite 1 2 3 1 2   Feeling bad or failure about yourself  1 1 0 0 1  Trouble concentrating 0 1 0 0 0  Moving slowly or fidgety/restless 0 1 0 0 0  Suicidal thoughts 0 0 1 1 1   PHQ-9 Score 6 10 11 8 7   Difficult doing work/chores Not difficult at all - Not difficult at all Not difficult at all Not difficult at all    Patient concerns:   None  Nurse concerns:  None  Next PCP appt:   02/18/18 @ 0930

## 2018-02-19 ENCOUNTER — Encounter: Payer: Self-pay | Admitting: Family Medicine

## 2018-02-19 NOTE — Assessment & Plan Note (Addendum)
Deterioration noted. Merits close monitoring. Recheck next visit. Encouraged good hydration status.

## 2018-02-19 NOTE — Assessment & Plan Note (Signed)
Cardiologist -Marlou Porch.

## 2018-02-19 NOTE — Assessment & Plan Note (Signed)
Chronic, stable. Continue pravastatin and zeita.  The 10-year ASCVD risk score Mikey Bussing DC Brooke Bonito., et al., 2013) is: 27.5%   Values used to calculate the score:     Age: 77 years     Sex: Female     Is Non-Hispanic African American: No     Diabetic: Yes     Tobacco smoker: No     Systolic Blood Pressure: 159 mmHg     Is BP treated: No     HDL Cholesterol: 38.4 mg/dL     Total Cholesterol: 147 mg/dL

## 2018-02-19 NOTE — Assessment & Plan Note (Addendum)
Continues effexor, celexa, remeron, and klonopin. Stable period. Desires to continue current regimen. She continues participating in activities outside of [place] (volunteering at [facility], regular exercise at [employer])

## 2018-02-19 NOTE — Assessment & Plan Note (Signed)
Mild deterioration noted. Encouraged renewed efforts at low carb/sugar diabetic diet. Will reassess at f/u visit.

## 2018-02-19 NOTE — Assessment & Plan Note (Addendum)
Continue cal/vit D and regular weight bearing exercises.  She agreed to prolia injection but I don't see where this was ever done. Will check with patient next visit.

## 2018-02-19 NOTE — Assessment & Plan Note (Addendum)
Chronic, stable. Continue levothyroxine 50mcg daily.  ?

## 2018-02-19 NOTE — Assessment & Plan Note (Signed)
Update carotid US.  

## 2018-02-19 NOTE — Assessment & Plan Note (Signed)
Update US.  

## 2018-03-02 ENCOUNTER — Ambulatory Visit (INDEPENDENT_AMBULATORY_CARE_PROVIDER_SITE_OTHER): Payer: PPO

## 2018-03-02 DIAGNOSIS — I6502 Occlusion and stenosis of left vertebral artery: Secondary | ICD-10-CM | POA: Diagnosis not present

## 2018-03-02 DIAGNOSIS — I6523 Occlusion and stenosis of bilateral carotid arteries: Secondary | ICD-10-CM

## 2018-03-04 ENCOUNTER — Encounter: Payer: Self-pay | Admitting: *Deleted

## 2018-03-09 ENCOUNTER — Other Ambulatory Visit: Payer: Self-pay | Admitting: Family Medicine

## 2018-03-10 ENCOUNTER — Ambulatory Visit (INDEPENDENT_AMBULATORY_CARE_PROVIDER_SITE_OTHER): Payer: PPO | Admitting: *Deleted

## 2018-03-10 ENCOUNTER — Other Ambulatory Visit: Payer: Self-pay | Admitting: Family Medicine

## 2018-03-10 DIAGNOSIS — M810 Age-related osteoporosis without current pathological fracture: Secondary | ICD-10-CM | POA: Diagnosis not present

## 2018-03-10 MED ORDER — DENOSUMAB 60 MG/ML ~~LOC~~ SOSY
60.0000 mg | PREFILLED_SYRINGE | Freq: Once | SUBCUTANEOUS | Status: AC
  Administered 2018-03-10: 60 mg via SUBCUTANEOUS

## 2018-03-10 NOTE — Progress Notes (Signed)
Per orders of Dr. Gutierrez, injection of Prolia given by [place], Donna Simpson. °Patient tolerated injection well. °

## 2018-04-07 NOTE — Progress Notes (Signed)
I reviewed health advisor's note, was available for consultation, and agree with documentation and plan.  

## 2018-04-13 ENCOUNTER — Other Ambulatory Visit: Payer: Self-pay | Admitting: Nurse Practitioner

## 2018-05-14 ENCOUNTER — Other Ambulatory Visit: Payer: Self-pay | Admitting: Family Medicine

## 2018-05-18 ENCOUNTER — Other Ambulatory Visit: Payer: Self-pay | Admitting: Family Medicine

## 2018-05-18 DIAGNOSIS — F331 Major depressive disorder, recurrent, moderate: Secondary | ICD-10-CM

## 2018-05-18 NOTE — Telephone Encounter (Signed)
Name of Medication: Clonazepam Name of Pharmacy:  [place] or Written Date and Quantity: 04/19/18, #60/0 Last Office Visit and Type: 02/18/18, CPE Next Office Visit and Type: 05/26/18, 3 mo f/u Last Controlled Substance Agreement Date: 12/19/16 Last UDS: 12/19/16

## 2018-05-19 NOTE — Telephone Encounter (Signed)
Eprescribed.

## 2018-05-24 ENCOUNTER — Ambulatory Visit: Payer: Self-pay | Admitting: Family Medicine

## 2018-05-26 ENCOUNTER — Encounter: Payer: Self-pay | Admitting: Family Medicine

## 2018-05-26 ENCOUNTER — Ambulatory Visit (INDEPENDENT_AMBULATORY_CARE_PROVIDER_SITE_OTHER): Payer: PPO | Admitting: Family Medicine

## 2018-05-26 VITALS — BP 124/60 | HR 64 | Temp 97.8°F | Ht 62.75 in | Wt 149.2 lb

## 2018-05-26 DIAGNOSIS — F331 Major depressive disorder, recurrent, moderate: Secondary | ICD-10-CM | POA: Diagnosis not present

## 2018-05-26 DIAGNOSIS — M810 Age-related osteoporosis without current pathological fracture: Secondary | ICD-10-CM

## 2018-05-26 DIAGNOSIS — E118 Type 2 diabetes mellitus with unspecified complications: Secondary | ICD-10-CM | POA: Diagnosis not present

## 2018-05-26 LAB — POCT GLYCOSYLATED HEMOGLOBIN (HGB A1C): HEMOGLOBIN A1C: 6.5 % — AB (ref 4.0–5.6)

## 2018-05-26 NOTE — Progress Notes (Signed)
BP 124/60 (BP Location: Left Arm, Patient Position: Sitting, Cuff Size: Normal)   Pulse 64   Temp 97.8 F (36.6 C) (Oral)   Ht 5' 2.75" (1.594 m)   Wt 149 lb 3 oz (67.7 kg)   LMP  (LMP Unknown)   SpO2 99%   BMI 26.64 kg/m    CC: 3 mo f/u visit Subjective:    Patient ID: Kathleen Williamson, female    DOB: 1940-12-04, 78 y.o.   MRN: [id number]  HPI: Kathleen Williamson is a 78 y.o. female presenting on 05/26/2018 for Follow-up (Here for 3 mo f/u.)    MDD - on effexor, celexa, remeron and klonopin. Continues volunteering at [facility], [facility] [employer]. No longer sees counselor. Going to church with friend Horris Latino.   Osteoporosis - regularly takes calcium and vitamin D as well as regular weight bearing exercises. Second prolia injection given 02/2018. Tolerating well.   DM - does regularly check sugars fasting in the morning:. Compliant with antihyperglycemic regimen which includes: diet controlled. Doesn't have great mealtime routine. Denies low sugars or hypoglycemic symptoms. Denies paresthesias. Last diabetic eye exam 11/2017. Pneumovax: ?2008. Prevnar: 2017. Glucometer brand: one touch. DSME: at [facility] 05/2017. Lab Results  Component Value Date   HGBA1C 6.5 (A) 05/26/2018   Diabetic Foot Exam - Simple   Simple Foot Form Diabetic Foot exam was performed with the following findings:  Yes 05/26/2018 12:46 PM  Visual Inspection No deformities, no ulcerations, no other skin breakdown bilaterally:  Yes Sensation Testing Intact to touch and monofilament testing bilaterally:  Yes Pulse Check Posterior Tibialis and Dorsalis pulse intact bilaterally:  Yes Comments    Lab Results  Component Value Date   MICROALBUR <0.7 02/16/2018         Relevant past medical, surgical, family and social history reviewed and updated as indicated. Interim medical history since our last visit reviewed. Allergies and medications reviewed and updated. Outpatient Medications Prior to Visit  Medication  Sig Dispense Refill  . aspirin EC 81 MG tablet Take 81 mg by mouth at bedtime.    . Biotin 5 MG CAPS Take 1 capsule by mouth daily.    . Calcium Carbonate-Vitamin D 600-400 MG-UNIT tablet Take 1 tablet by mouth 2 (two) times daily.    . citalopram (CELEXA) 20 MG tablet TAKE 1 TABLET BY MOUTH EVERY DAY 90 tablet 1  . clonazePAM (KLONOPIN) 0.5 MG tablet TAKE 1 TABLET BY MOUTH TWICE A DAY AS NEEDED 60 tablet 0  . Coenzyme Q10 (COQ10) 100 MG CAPS Take 1 capsule by mouth at bedtime.     Marland Kitchen ezetimibe (ZETIA) 10 MG tablet TAKE 1 TABLET BY MOUTH ONCE DAILY 90 tablet 0  . Glucosamine-Chondroitin (MOVE FREE PO) Take 2 tablets by mouth daily.    Marland Kitchen glucose blood (ONETOUCH VERIO) test strip 1 each by Other route as needed for other. Use as instructed to check sugar once a day.  Dx code:  E11.21 100 each 3  . levothyroxine (SYNTHROID, LEVOTHROID) 50 MCG tablet TAKE 1 TABLET BY MOUTH EVERY DAY 90 tablet 1  . loperamide (IMODIUM A-D) 2 MG tablet Take 2 mg by mouth every morning.    . mirtazapine (REMERON) 15 MG tablet TAKE 1 TABLET BY MOUTH EVERYDAY AT BEDTIME 90 tablet 3  . nitroGLYCERIN (NITROLINGUAL) 0.4 MG/SPRAY spray Place 1 spray under the tongue every 5 (five) minutes as needed. 12 g prn  . ONETOUCH DELICA LANCETS 08M MISC 1 each by Does not  apply route as directed. Use as instructed to check sugar once a day.  Dx code:  E11.21 100 each 3  . pravastatin (PRAVACHOL) 80 MG tablet Take 1 tablet (80 mg total) by mouth every evening. 90 tablet 3  . venlafaxine XR (EFFEXOR-XR) 75 MG 24 hr capsule TAKE 1 CAPSULE (75 MG TOTAL) BY MOUTH DAILY WITH BREAKFAST. 90 capsule 3  . vitamin B-12 (CYANOCOBALAMIN) 1000 MCG tablet Take 1,000 mcg by mouth daily.     No facility-administered medications prior to visit.      Per HPI unless specifically indicated in ROS section below Review of Systems Objective:    BP 124/60 (BP Location: Left Arm, Patient Position: Sitting, Cuff Size: Normal)   Pulse 64   Temp 97.8 F  (36.6 C) (Oral)   Ht 5' 2.75" (1.594 m)   Wt 149 lb 3 oz (67.7 kg)   LMP  (LMP Unknown)   SpO2 99%   BMI 26.64 kg/m   Wt Readings from Last 3 Encounters:  05/26/18 149 lb 3 oz (67.7 kg)  02/18/18 152 lb 12 oz (69.3 kg)  02/16/18 150 lb 4 oz (68.2 kg)    Physical Exam Vitals signs and nursing note reviewed.  Constitutional:      General: She is not in acute distress.    Appearance: Normal appearance. She is well-developed. She is not ill-appearing.  HENT:     Head: Normocephalic and atraumatic.     Right Ear: External ear normal.     Left Ear: External ear normal.     Nose: Nose normal.     Mouth/Throat:     Mouth: Mucous membranes are moist.     Pharynx: Oropharynx is clear. No oropharyngeal exudate.  Eyes:     General: No scleral icterus.    Conjunctiva/sclera: Conjunctivae normal.     Pupils: Pupils are equal, round, and reactive to light.  Neck:     Musculoskeletal: Normal range of motion and neck supple.  Cardiovascular:     Rate and Rhythm: Normal rate and regular rhythm.     Pulses: Normal pulses.     Heart sounds: Normal heart sounds. No murmur.  Pulmonary:     Effort: Pulmonary effort is normal. No respiratory distress.     Breath sounds: Normal breath sounds. No wheezing, rhonchi or rales.  Musculoskeletal:     Comments: See HPI for foot exam if done  Lymphadenopathy:     Cervical: No cervical adenopathy.  Skin:    General: Skin is warm and dry.     Findings: No rash.  Neurological:     Mental Status: She is alert.  Psychiatric:        Mood and Affect: Mood normal.        Behavior: Behavior normal.        Thought Content: Thought content normal.        Judgment: Judgment normal.       Results for orders placed or performed in visit on 05/26/18  POCT glycosylated hemoglobin (Hb A1C)  Result Value Ref Range   Hemoglobin A1C 6.5 (A) 4.0 - 5.6 %   HbA1c POC (<> result, manual entry)     HbA1c, POC (prediabetic range)     HbA1c, POC (controlled  diabetic range)     Assessment & Plan:   Problem List Items Addressed This Visit    Osteoporosis    Chronic, stable on prolia (started 08/2017).       MDD (major depressive disorder), recurrent  episode, moderate (HCC)    Chronic, stable on current regimen - desires to continue current regimen. Support provided. Attendance at new church has helped       Diabetes mellitus type 2, controlled, with complications ([place]) - Primary    Chronic, improved. Continue diet controlled management. Reviewed with patient.      Relevant Orders   POCT glycosylated hemoglobin (Hb A1C) (Completed)       No orders of the defined types were placed in this encounter.  Orders Placed This Encounter  Procedures  . POCT glycosylated hemoglobin (Hb A1C)    Follow up plan: Return in about 5 months (around 10/24/2018) for follow up visit.  [name], MD

## 2018-05-26 NOTE — Patient Instructions (Signed)
You are doing well today.  Continue current medicines. Return in 4-5 months for follow up visit.

## 2018-05-26 NOTE — Assessment & Plan Note (Signed)
Chronic, stable on prolia (started 08/2017).

## 2018-05-26 NOTE — Assessment & Plan Note (Signed)
Chronic, stable on current regimen - desires to continue current regimen. Support provided. Attendance at new church has helped

## 2018-05-26 NOTE — Assessment & Plan Note (Signed)
Chronic, improved. Continue diet controlled management. Reviewed with patient.

## 2018-06-16 ENCOUNTER — Other Ambulatory Visit: Payer: Self-pay | Admitting: Family Medicine

## 2018-06-16 DIAGNOSIS — F331 Major depressive disorder, recurrent, moderate: Secondary | ICD-10-CM

## 2018-06-16 NOTE — Telephone Encounter (Signed)
Name of Medication: Clonazepam Name of Pharmacy: [facility] or Written Date and Quantity: 05/19/18, #60 Last Office Visit and Type: 05/26/18, f/u Next Office Visit and Type: 09/27/18, 4-5 mo f/u Last Controlled Substance Agreement Date: 12/19/16 Last UDS: 12/19/16

## 2018-06-17 NOTE — Telephone Encounter (Signed)
Eprescribed.

## 2018-06-18 DIAGNOSIS — Z1231 Encounter for screening mammogram for malignant neoplasm of breast: Secondary | ICD-10-CM | POA: Diagnosis not present

## 2018-06-18 DIAGNOSIS — Z803 Family history of malignant neoplasm of breast: Secondary | ICD-10-CM | POA: Diagnosis not present

## 2018-06-18 LAB — HM MAMMOGRAPHY

## 2018-06-22 ENCOUNTER — Encounter: Payer: Self-pay | Admitting: Family Medicine

## 2018-06-23 ENCOUNTER — Encounter: Payer: Self-pay | Admitting: Cardiology

## 2018-06-28 ENCOUNTER — Other Ambulatory Visit: Payer: Self-pay | Admitting: Nurse Practitioner

## 2018-07-05 ENCOUNTER — Ambulatory Visit: Payer: PPO | Admitting: Cardiology

## 2018-07-09 ENCOUNTER — Other Ambulatory Visit: Payer: Self-pay | Admitting: Family Medicine

## 2018-07-09 DIAGNOSIS — F331 Major depressive disorder, recurrent, moderate: Secondary | ICD-10-CM

## 2018-07-09 NOTE — Telephone Encounter (Signed)
Name of Medication: Clonazepam Name of Pharmacy: [place] or Written Date and Quantity: 06/17/18, #60 Last Office Visit and Type: 05/26/18, f/u Next Office Visit and Type: 09/27/18, 4-5 mo f/u Last Controlled Substance Agreement Date: 12/19/16 Last UDS: 12/19/16

## 2018-07-11 NOTE — Telephone Encounter (Signed)
Eprescribed.

## 2018-08-09 ENCOUNTER — Other Ambulatory Visit: Payer: Self-pay | Admitting: Family Medicine

## 2018-08-13 ENCOUNTER — Other Ambulatory Visit: Payer: Self-pay | Admitting: Family Medicine

## 2018-08-13 DIAGNOSIS — F331 Major depressive disorder, recurrent, moderate: Secondary | ICD-10-CM

## 2018-08-13 NOTE — Telephone Encounter (Signed)
Eprescribed.

## 2018-08-17 ENCOUNTER — Telehealth (INDEPENDENT_AMBULATORY_CARE_PROVIDER_SITE_OTHER): Payer: PPO | Admitting: Cardiology

## 2018-08-17 ENCOUNTER — Other Ambulatory Visit: Payer: Self-pay

## 2018-08-17 ENCOUNTER — Encounter: Payer: Self-pay | Admitting: Cardiology

## 2018-08-17 VITALS — Ht 62.75 in | Wt 146.0 lb

## 2018-08-17 DIAGNOSIS — E782 Mixed hyperlipidemia: Secondary | ICD-10-CM

## 2018-08-17 DIAGNOSIS — I2510 Atherosclerotic heart disease of native coronary artery without angina pectoris: Secondary | ICD-10-CM

## 2018-08-17 DIAGNOSIS — I6523 Occlusion and stenosis of bilateral carotid arteries: Secondary | ICD-10-CM

## 2018-08-17 DIAGNOSIS — I259 Chronic ischemic heart disease, unspecified: Secondary | ICD-10-CM

## 2018-08-17 NOTE — Progress Notes (Signed)
Virtual Visit via Telephone Note   This visit type was conducted due to national recommendations for restrictions regarding the COVID-19 Pandemic (e.g. social distancing) in an effort to limit this patient's exposure and mitigate transmission in our community.  Due to her co-morbid illnesses, this patient is at least at moderate risk for complications without adequate follow up.  This format is felt to be most appropriate for this patient at this time.  The patient did not have access to video technology/had technical difficulties with video requiring transitioning to audio format only (telephone).  All issues noted in this document were discussed and addressed.  No physical exam could be performed with this format.  Please refer to the patient's chart for her  consent to telehealth for [facility].   Date:  08/17/2018   ID:  Kathleen Williamson, DOB 09-04-1940, MRN [id number]  Patient Location: Home Provider Location: Home  PCP:  [name], MD  Cardiologist:  Candee Furbish, MD  Electrophysiologist:  None   Evaluation Performed:  Follow-Up Visit  Chief Complaint: Coronary artery disease follow-up  History of Present Illness:    Kathleen Williamson is a 78 y.o. female with coronary artery disease status post CABG in 2003, LIMA, RIMA, SVG diagonal, SVG to OM 2 and 5, carotid artery disease, dermatomyositis, depression hyperlipidemia and hypothyroidism former patient of Dr. Leonia Reeves here for follow-up.  Prior nuclear stress test in 2012 showed no ischemia.  Has been intolerant to several statins but she was able to take Pravachol.  Also on Zetia. (Trouble Zocor, Lipitor). Did have some prolonged grief after her husband died in 72.  Overall been doing fairly well.    Lives at [facility]. Lock down. Not going to main area. Walks a mile a day. No exercise class. No CP.  No anginal symptoms.  Quite satisfied.  Tolerating all medications well.  No myalgias.  No bleeding.  NTG spray.  She  likes the spray given the longevity of the product.  Prolia shots.   The patient does not have symptoms concerning for COVID-19 infection (fever, chills, cough, or new shortness of breath).    Past Medical History:  Diagnosis Date  . Arthritis    fingers  . CAD (coronary artery disease) 2003   MI s/p 5v CABG  . Carotid stenosis    [place]  . CKD (chronic kidney disease) stage 3, GFR 30-59 ml/min (HCC)   . Dermatomyositis [facility])    saw Dr Estanislado Pandy, improved on its own  . Diabetes mellitus without complication ([place])   . Forehead laceration 09/15/2017  . History of chicken pox   . History of measles   . History of migraine none since 2003  . HLD (hyperlipidemia)   . Hypothyroidism   . MDD (major depressive disorder), recurrent episode, moderate ([place])    h/o SI after lost husband unexpectedly 2015  . Microscopic colitis 05/2014   by colonoscopy, lymphocytic, zoloft related  . Osteoporosis    DEXA 03/2013 T -3.1, DEXA 10//2016 -2.9 hip, -2.2 spine  . Pancreatic cyst    Dr Ralene Ok, no f/u needed  . Prediabetes 2014  . Subclavian steal syndrome   . Urine incontinence    Past Surgical History:  Procedure Laterality Date  . APPENDECTOMY  1997   with hysterectomy  . [place]   lower  . BREAST LUMPECTOMY Left 1977   benign  . COLONOSCOPY WITH PROPOFOL N/A 05/23/2014   microscopic colitis - Garlan Fair, MD  .  CORONARY ARTERY BYPASS GRAFT  2003   x 5 v  . ESOPHAGOGASTRODUODENOSCOPY (EGD) WITH PROPOFOL N/A 05/23/2014   WNL Garlan Fair, MD  . [place] Right 1990's  . TOOTH EXTRACTION  yrs ago  . TOTAL ABDOMINAL HYSTERECTOMY W/ BILATERAL SALPINGOOPHORECTOMY  1997   endometriosis     Current Meds  Medication Sig  . aspirin EC 81 MG tablet Take 81 mg by mouth at bedtime.  . Biotin 5 MG CAPS Take 1 capsule by mouth daily.  . Calcium Carbonate-Vitamin D 600-400 MG-UNIT tablet Take 1 tablet by mouth 2 (two) times daily.  . citalopram (CELEXA) 20 MG  tablet TAKE 1 TABLET BY MOUTH EVERY DAY  . clonazePAM (KLONOPIN) 0.5 MG tablet TAKE 1 TABLET BY MOUTH TWICE A DAY AS NEEDED  . Coenzyme Q10 (COQ10) 100 MG CAPS Take 1 capsule by mouth at bedtime.   Marland Kitchen ezetimibe (ZETIA) 10 MG tablet TAKE 1 TABLET BY MOUTH ONCE DAILY  . Glucosamine-Chondroitin (MOVE FREE PO) Take 2 tablets by mouth daily.  Marland Kitchen glucose blood (ONETOUCH VERIO) test strip 1 each by Other route as needed for other. Use as instructed to check sugar once a day.  Dx code:  E11.21  . levothyroxine (SYNTHROID, LEVOTHROID) 50 MCG tablet TAKE 1 TABLET BY MOUTH EVERY DAY  . loperamide (IMODIUM A-D) 2 MG tablet Take 2 mg by mouth every morning.  . mirtazapine (REMERON) 15 MG tablet TAKE 1 TABLET BY MOUTH EVERYDAY AT BEDTIME  . nitroGLYCERIN (NITROLINGUAL) 0.4 MG/SPRAY spray Place 1 spray under the tongue every 5 (five) minutes as needed.  Glory Rosebush DELICA LANCETS 76E MISC 1 each by Does not apply route as directed. Use as instructed to check sugar once a day.  Dx code:  E11.21  . pravastatin (PRAVACHOL) 80 MG tablet TAKE 1 TABLET (80 MG TOTAL) BY MOUTH EVERY EVENING.  Marland Kitchen venlafaxine XR (EFFEXOR-XR) 75 MG 24 hr capsule TAKE 1 CAPSULE (75 MG TOTAL) BY MOUTH DAILY WITH BREAKFAST.     Allergies:   Sulfa antibiotics and Crestor [rosuvastatin calcium]   Social History   Tobacco Use  . Smoking status: Never Smoker  . Smokeless tobacco: Never Used  Substance Use Topics  . Alcohol use: No  . Drug use: No     Family Hx: The patient's family history includes CAD in an other family member; CAD (age of onset: 89) in her father; CAD (age of onset: 57) in her mother; Cancer in her maternal aunt; Diabetes in her mother; Rheum arthritis in her mother. There is no history of Stroke.  ROS:   Please see the history of present illness.    No fevers chills nausea vomiting syncope bleeding All other systems reviewed and are negative.   Prior CV studies:   The following studies were reviewed today:   Carotid ultrasound 03/02/2018-mild plaque bilaterally.  Labs/Other Tests and Data Reviewed:    EKG:  An ECG dated 09/09/2017 was personally reviewed today and demonstrated:  Sinus rhythm, baseline artifact, no other significant abnormalities  Recent Labs: 02/16/2018: ALT 13; BUN 19; Creatinine, Ser 1.64; Hemoglobin 12.6; Platelets 226.0; Potassium 4.8; Sodium 138; TSH 1.69   Recent Lipid Panel Lab Results  Component Value Date/Time   CHOL 147 02/16/2018 09:24 AM   CHOL 206 06/20/2014   TRIG 210.0 (H) 02/16/2018 09:24 AM   TRIG 155 06/20/2014   HDL 38.40 (L) 02/16/2018 09:24 AM   HDL 39 06/20/2014   CHOLHDL 4 02/16/2018 09:24 AM   LDLCALC  62 02/10/2017 09:05 AM   LDLCALC 135 06/20/2014   LDLDIRECT 81.0 02/16/2018 09:24 AM    Wt Readings from Last 3 Encounters:  08/17/18 146 lb (66.2 kg)  05/26/18 149 lb 3 oz (67.7 kg)  02/18/18 152 lb 12 oz (69.3 kg)     Objective:    Vital Signs:  Ht 5' 2.75" (1.594 m)   Wt 146 lb (66.2 kg)   LMP  (LMP Unknown)   BMI 26.07 kg/m    VITAL SIGNS:  reviewed able to complete full sentences without difficulty, normal respiratory effort, alert, pleasant  ASSESSMENT & PLAN:    Coronary artery disease status post CABG 2003 - Doing very well, no anginal symptoms, excellent secondary risk factor prevention.  Hyperlipidemia - Prior statin intolerance to Zocor and Lipitor.  Doing very well on Zetia and pravastatin combination.  Last direct LDL was 81, close to goal of 70.  Triglycerides 210 back in November 2019.  Lab work followed by Dr. Danise Mina  Carotid artery plaque -Mild, as needed follow-up.  Aggressive secondary risk factor prevention  COVID-19 Education: The signs and symptoms of COVID-19 were discussed with the patient and how to seek care for testing (follow up with PCP or arrange E-visit).  The importance of social distancing was discussed today.  Time:   Today, I have spent 11 minutes with the patient with telehealth technology  discussing the above problems.     Medication Adjustments/Labs and Tests Ordered: Current medicines are reviewed at length with the patient today.  Concerns regarding medicines are outlined above.   Tests Ordered: No orders of the defined types were placed in this encounter.   Medication Changes: No orders of the defined types were placed in this encounter.   Disposition:  Follow up in 1 year(s)  Signed, Candee Furbish, MD  08/17/2018 10:51 AM    [facility]

## 2018-08-17 NOTE — Patient Instructions (Signed)
  Medication Instructions:  The current medical regimen is effective;  continue present plan and medications.  If you need a refill on your cardiac medications before your next appointment, please call your pharmacy.   Follow-Up: Follow up in 1 year with Dr. Skains.  You will receive a letter in the mail 2 months before you are due.  Please call us when you receive this letter to schedule your follow up appointment.  Thank you for choosing [facility]!!     

## 2018-09-10 ENCOUNTER — Telehealth: Payer: Self-pay | Admitting: Family Medicine

## 2018-09-10 NOTE — Telephone Encounter (Signed)
Prolia benefits discussed and pt scheduled.  Pt will owe approximately $255.

## 2018-09-13 ENCOUNTER — Other Ambulatory Visit: Payer: Self-pay | Admitting: Family Medicine

## 2018-09-13 DIAGNOSIS — F331 Major depressive disorder, recurrent, moderate: Secondary | ICD-10-CM

## 2018-09-13 NOTE — Telephone Encounter (Signed)
Eprescribed.

## 2018-09-13 NOTE — Telephone Encounter (Signed)
Name of Medication: Clonazepam Name of Pharmacy: [place] or Written Date and Quantity: 08/13/18, #60 Last Office Visit and Type: 05/26/18, f/u Next Office Visit and Type: 09/27/18, 4-5 mo f/u Last Controlled Substance Agreement Date: 12/19/16 Last UDS: 12/19/16

## 2018-09-16 ENCOUNTER — Other Ambulatory Visit: Payer: Self-pay | Admitting: Family Medicine

## 2018-09-21 ENCOUNTER — Ambulatory Visit: Payer: PPO

## 2018-09-21 ENCOUNTER — Other Ambulatory Visit: Payer: Self-pay

## 2018-09-21 ENCOUNTER — Ambulatory Visit (INDEPENDENT_AMBULATORY_CARE_PROVIDER_SITE_OTHER): Payer: PPO | Admitting: Family Medicine

## 2018-09-21 ENCOUNTER — Encounter: Payer: Self-pay | Admitting: Family Medicine

## 2018-09-21 VITALS — BP 132/68 | HR 82 | Temp 98.2°F | Ht 62.75 in | Wt 147.0 lb

## 2018-09-21 DIAGNOSIS — W57XXXA Bitten or stung by nonvenomous insect and other nonvenomous arthropods, initial encounter: Secondary | ICD-10-CM

## 2018-09-21 DIAGNOSIS — S20369A Insect bite (nonvenomous) of unspecified front wall of thorax, initial encounter: Secondary | ICD-10-CM | POA: Diagnosis not present

## 2018-09-21 NOTE — Assessment & Plan Note (Addendum)
Overall feeling well today.  Reviewed red flags of tick borne illness to notify us immediately for abx course. Pt agrees with plan.  She will have her retired doctor friend Kathleen Williamson keep an eye on this.

## 2018-09-21 NOTE — Progress Notes (Signed)
Virtual visit completed through [employer]. Due to national recommendations of social distancing due to COVID-19, a virtual visit is felt to be most appropriate for this patient at this time. Reviewed limitations of a virtual visit. Interactive audio and video telecommunications were attempted between myself and Kathleen Williamson, however failed due to patient having technical difficulties. We continued and completed visit with audio only.  Time: 11:13am - 11:24am   Patient location: home Provider location: [place] [facility], office If any vitals were documented, they were collected by patient at home unless specified below.    BP 132/68   Pulse 82   Temp 98.2 F (36.8 C)   Ht 5' 2.75" (1.594 m)   Wt 147 lb (66.7 kg)   LMP  (LMP Unknown)   BMI 26.25 kg/m    CC: tick bite Subjective:    Patient ID: Kathleen Williamson, female    DOB: [birth year], 78 y.o.   MRN: [id number]  HPI: Kathleen Williamson is a 78 y.o. female presenting on 09/21/2018 for Insect Bite (C/o tick bite located on left chest under collarbone. )   Friday morning noted tick attached to L collarbone. Itchy spot on skin. She removed it, took a long time but she's pretty confident she got the head fully out. Initially cleaned with alcohol, now treating bite site with triple abx cream.   Thinks tick was attached for more than 1 day but less than 3 days.   Yesterday had episode of malaise that has since resolved.   No fevers/chills, joint pains, new skin rash, HA, abd pain or nausea or malaise.      Relevant past medical, surgical, family and social history reviewed and updated as indicated. Interim medical history since our last visit reviewed. Allergies and medications reviewed and updated. Outpatient Medications Prior to Visit  Medication Sig Dispense Refill  . aspirin EC 81 MG tablet Take 81 mg by mouth at bedtime.    . Biotin 5 MG CAPS Take 1 capsule by mouth daily.    . Calcium Carbonate-Vitamin D 600-400 MG-UNIT tablet  Take 1 tablet by mouth 2 (two) times daily.    . citalopram (CELEXA) 20 MG tablet TAKE 1 TABLET BY MOUTH EVERY DAY 90 tablet 0  . clonazePAM (KLONOPIN) 0.5 MG tablet TAKE 1 TABLET BY MOUTH TWICE A DAY AS NEEDED 60 tablet 0  . Coenzyme Q10 (COQ10) 100 MG CAPS Take 1 capsule by mouth at bedtime.     Marland Kitchen ezetimibe (ZETIA) 10 MG tablet TAKE 1 TABLET BY MOUTH ONCE DAILY 90 tablet 0  . Glucosamine-Chondroitin (MOVE FREE PO) Take 2 tablets by mouth daily.    Marland Kitchen glucose blood (ONETOUCH VERIO) test strip 1 each by Other route as needed for other. Use as instructed to check sugar once a day.  Dx code:  E11.21 100 each 3  . levothyroxine (SYNTHROID) 50 MCG tablet TAKE 1 TABLET BY MOUTH EVERY DAY 90 tablet 2  . loperamide (IMODIUM A-D) 2 MG tablet Take 2 mg by mouth every morning.    . mirtazapine (REMERON) 15 MG tablet TAKE 1 TABLET BY MOUTH EVERYDAY AT BEDTIME 90 tablet 3  . nitroGLYCERIN (NITROLINGUAL) 0.4 MG/SPRAY spray Place 1 spray under the tongue every 5 (five) minutes as needed. 12 g prn  . ONETOUCH DELICA LANCETS 36O MISC 1 each by Does not apply route as directed. Use as instructed to check sugar once a day.  Dx code:  E11.21 100 each 3  . pravastatin (  PRAVACHOL) 80 MG tablet TAKE 1 TABLET (80 MG TOTAL) BY MOUTH EVERY EVENING. 90 tablet 0  . venlafaxine XR (EFFEXOR-XR) 75 MG 24 hr capsule TAKE 1 CAPSULE (75 MG TOTAL) BY MOUTH DAILY WITH BREAKFAST. 90 capsule 3   No facility-administered medications prior to visit.      Per HPI unless specifically indicated in ROS section below Review of Systems Objective:    BP 132/68   Pulse 82   Temp 98.2 F (36.8 C)   Ht 5' 2.75" (1.594 m)   Wt 147 lb (66.7 kg)   LMP  (LMP Unknown)   BMI 26.25 kg/m   Wt Readings from Last 3 Encounters:  09/21/18 147 lb (66.7 kg)  08/17/18 146 lb (66.2 kg)  05/26/18 149 lb 3 oz (67.7 kg)     Physical exam: Gen: alert, NAD, not ill appearing Pulm: speaks in complete sentences without increased work of breathing  Psych: normal mood, normal thought content      Assessment & Plan:   Problem List Items Addressed This Visit    Tick bite of chest wall, initial encounter - Primary    Overall feeling well today.  Reviewed red flags of tick borne illness to notify us immediately for abx course. Pt agrees with plan.  She will have her retired doctor friend [name] keep an eye on this.           No orders of the defined types were placed in this encounter.  No orders of the defined types were placed in this encounter.   I discussed the assessment and treatment plan with the patient. The patient was provided an opportunity to ask questions and all were answered. The patient agreed with the plan and demonstrated an understanding of the instructions. The patient was advised to call back or seek an in-person evaluation if the symptoms worsen or if the condition fails to improve as anticipated.  Follow up plan: No follow-ups on file.  Kathleen Bush, MD

## 2018-09-22 ENCOUNTER — Other Ambulatory Visit: Payer: Self-pay | Admitting: Nurse Practitioner

## 2018-09-25 ENCOUNTER — Other Ambulatory Visit: Payer: Self-pay | Admitting: Family Medicine

## 2018-09-27 ENCOUNTER — Encounter: Payer: Self-pay | Admitting: Family Medicine

## 2018-09-27 ENCOUNTER — Ambulatory Visit (INDEPENDENT_AMBULATORY_CARE_PROVIDER_SITE_OTHER): Payer: PPO | Admitting: Family Medicine

## 2018-09-27 VITALS — Ht 62.75 in | Wt 147.0 lb

## 2018-09-27 DIAGNOSIS — S20369A Insect bite (nonvenomous) of unspecified front wall of thorax, initial encounter: Secondary | ICD-10-CM

## 2018-09-27 DIAGNOSIS — F331 Major depressive disorder, recurrent, moderate: Secondary | ICD-10-CM

## 2018-09-27 DIAGNOSIS — N183 Chronic kidney disease, stage 3 unspecified: Secondary | ICD-10-CM

## 2018-09-27 DIAGNOSIS — W57XXXA Bitten or stung by nonvenomous insect and other nonvenomous arthropods, initial encounter: Secondary | ICD-10-CM

## 2018-09-27 DIAGNOSIS — E118 Type 2 diabetes mellitus with unspecified complications: Secondary | ICD-10-CM

## 2018-09-27 NOTE — Telephone Encounter (Signed)
plz call pt to reschedule prolia shot thank you

## 2018-09-27 NOTE — Assessment & Plan Note (Addendum)
Chronic, stable on current regimen. Struggling with [phone] isolation - requests counseling referral which was placed. Support provided.

## 2018-09-27 NOTE — Assessment & Plan Note (Signed)
Pt endorses this continues to heal well. No signs/symptoms of tick borne illness.

## 2018-09-27 NOTE — Progress Notes (Signed)
Virtual visit completed through [employer]. Due to national recommendations of social distancing due to COVID-19, a virtual visit is felt to be most appropriate for this patient at this time. Reviewed limitations of a virtual visit. Interactive audio and video telecommunications were attempted between myself and Kathleen Williamson, however failed due to patient having technical difficulties. We continued and completed visit with audio only.  Time: 8:52am - 9:12am   Patient location: home Provider location: [place] [facility], office If any vitals were documented, they were collected by patient at home unless specified below.    Ht 5' 2.75" (1.594 m)   Wt 147 lb (66.7 kg)   LMP  (LMP Unknown)   BMI 26.25 kg/m    CC: 4 mo f/u visit Subjective:    Patient ID: Kathleen Williamson, female    DOB: [birth year], 78 y.o.   MRN: [id number]  HPI: Kathleen Williamson is a 78 y.o. female presenting on 09/27/2018 for Follow-up (4-5 mo f/u.)   Living at [facility] - with precautions in place. They have only had 1 known positive case.   Tick bite last week - see prior note for details. Getting smaller, has been using a skin burn cream with vit E and coco butter (Bacitracin/zinc cream). No surrounding redness.  Slowly healing stye in R eye. Treating with warm compresses.   MDD - struggled with husband's passing 2015. Continues effexor, celexa, remeron and klonopin. She desires to continue current meds, declines titration. Does request referral back to counselor. Has stopped volunteering at [employer] due to [phone]. [facility] to [facility]. She does walk around campus Not currently seeing counselor. Has been unable to attend, church but watches recording on Sundays at friend's house every Sunday.   DM - does regularly regularly check sugars fasting in am: staying 120-130. Compliant with antihyperglycemic regimen which includes: diet controlled. Denies low sugars or hypoglycemic symptoms. Denies paresthesias. Last  diabetic eye exam 11/2017. Pneumovax: ?2008. Prevnar: 2017. Glucometer brand: one-touch. DSME: at [facility] 05/2017. Lab Results  Component Value Date   HGBA1C 6.5 (A) 05/26/2018   Diabetic Foot Exam - Simple   No data filed     Lab Results  Component Value Date   MICROALBUR <0.7 02/16/2018        Relevant past medical, surgical, family and social history reviewed and updated as indicated. Interim medical history since our last visit reviewed. Allergies and medications reviewed and updated. Outpatient Medications Prior to Visit  Medication Sig Dispense Refill  . aspirin EC 81 MG tablet Take 81 mg by mouth at bedtime.    . Biotin 5 MG CAPS Take 1 capsule by mouth daily.    . Calcium Carbonate-Vitamin D 600-400 MG-UNIT tablet Take 1 tablet by mouth 2 (two) times daily.    . citalopram (CELEXA) 20 MG tablet TAKE 1 TABLET BY MOUTH EVERY DAY 90 tablet 0  . clonazePAM (KLONOPIN) 0.5 MG tablet TAKE 1 TABLET BY MOUTH TWICE A DAY AS NEEDED 60 tablet 0  . Coenzyme Q10 (COQ10) 100 MG CAPS Take 1 capsule by mouth at bedtime.     Marland Kitchen ezetimibe (ZETIA) 10 MG tablet TAKE 1 TABLET BY MOUTH ONCE DAILY 90 tablet 0  . Glucosamine-Chondroitin (MOVE FREE PO) Take 2 tablets by mouth daily.    Marland Kitchen glucose blood (ONETOUCH VERIO) test strip 1 each by Other route as needed for other. Use as instructed to check sugar once a day.  Dx code:  E11.21 100 each  3  . levothyroxine (SYNTHROID) 50 MCG tablet TAKE 1 TABLET BY MOUTH EVERY DAY 90 tablet 2  . loperamide (IMODIUM A-D) 2 MG tablet Take 2 mg by mouth every morning.    . mirtazapine (REMERON) 15 MG tablet TAKE 1 TABLET BY MOUTH EVERYDAY AT BEDTIME 90 tablet 3  . nitroGLYCERIN (NITROLINGUAL) 0.4 MG/SPRAY spray Place 1 spray under the tongue every 5 (five) minutes as needed. 12 g prn  . ONETOUCH DELICA LANCETS 18H MISC 1 each by Does not apply route as directed. Use as instructed to check sugar once a day.  Dx code:  E11.21 100 each 3  . pravastatin (PRAVACHOL) 80 MG  tablet TAKE 1 TABLET BY MOUTH EVERY EVENING 90 tablet 3  . venlafaxine XR (EFFEXOR-XR) 75 MG 24 hr capsule TAKE 1 CAPSULE (75 MG TOTAL) BY MOUTH DAILY WITH BREAKFAST. 90 capsule 3   No facility-administered medications prior to visit.      Per HPI unless specifically indicated in ROS section below Review of Systems Objective:    Ht 5' 2.75" (1.594 m)   Wt 147 lb (66.7 kg)   LMP  (LMP Unknown)   BMI 26.25 kg/m   Wt Readings from Last 3 Encounters:  09/27/18 147 lb (66.7 kg)  09/21/18 147 lb (66.7 kg)  08/17/18 146 lb (66.2 kg)     Physical exam: Gen: alert, NAD, not ill appearing Pulm: speaks in complete sentences without increased work of breathing Psych: normal mood, normal thought content      Results for orders placed or performed in visit on 06/22/18  HM MAMMOGRAPHY  Result Value Ref Range   HM Mammogram 0-4 Bi-Rad 0-4 Bi-Rad, Self Reported Normal   Lab Results  Component Value Date   CREATININE 1.64 (H) 02/16/2018   BUN 19 02/16/2018   NA 138 02/16/2018   K 4.8 02/16/2018   CL 105 02/16/2018   CO2 25 02/16/2018   Assessment & Plan:   Problem List Items Addressed This Visit    Tick bite of chest wall, initial encounter    Pt endorses this continues to heal well. No signs/symptoms of tick borne illness.       MDD (major depressive disorder), recurrent episode, moderate (HCC) - Primary    Chronic, stable on current regimen. Struggling with [phone] isolation - requests counseling referral which was placed. Support provided.       Relevant Orders   Ambulatory referral to Psychology   Diabetes mellitus type 2, controlled, with complications (HCC)    Chronic, stable. Continue diet control.       Relevant Orders   Hemoglobin A1c   CKD (chronic kidney disease) stage 3, GFR 30-59 ml/min (HCC)    I have asked her to return on Wednesday to monitor kidney function (previous value had deteriorated).       Relevant Orders   Renal function panel       No  orders of the defined types were placed in this encounter.  Orders Placed This Encounter  Procedures  . Renal function panel    Standing Status:   Future    Standing Expiration Date:   09/27/2019  . Hemoglobin A1c    Standing Status:   Future    Standing Expiration Date:   09/27/2019  . Ambulatory referral to Psychology    Referral Priority:   Routine    Referral Type:   Psychiatric    Referral Reason:   Specialty Services Required    Requested Specialty:  Psychology    Number of Visits Requested:   1    I discussed the assessment and treatment plan with the patient. The patient was provided an opportunity to ask questions and all were answered. The patient agreed with the plan and demonstrated an understanding of the instructions. The patient was advised to call back or seek an in-person evaluation if the symptoms worsen or if the condition fails to improve as anticipated.  Follow up plan: No follow-ups on file.  [name], MD

## 2018-09-27 NOTE — Assessment & Plan Note (Signed)
Chronic, stable. Continue diet control.  

## 2018-09-27 NOTE — Assessment & Plan Note (Signed)
I have asked her to return on Wednesday to monitor kidney function (previous value had deteriorated).

## 2018-09-29 ENCOUNTER — Ambulatory Visit (INDEPENDENT_AMBULATORY_CARE_PROVIDER_SITE_OTHER): Payer: PPO

## 2018-09-29 ENCOUNTER — Other Ambulatory Visit (INDEPENDENT_AMBULATORY_CARE_PROVIDER_SITE_OTHER): Payer: PPO

## 2018-09-29 ENCOUNTER — Other Ambulatory Visit: Payer: Self-pay

## 2018-09-29 DIAGNOSIS — E118 Type 2 diabetes mellitus with unspecified complications: Secondary | ICD-10-CM | POA: Diagnosis not present

## 2018-09-29 DIAGNOSIS — M810 Age-related osteoporosis without current pathological fracture: Secondary | ICD-10-CM | POA: Diagnosis not present

## 2018-09-29 DIAGNOSIS — N183 Chronic kidney disease, stage 3 unspecified: Secondary | ICD-10-CM

## 2018-09-29 LAB — RENAL FUNCTION PANEL
Albumin: 4.1 g/dL (ref 3.5–5.2)
BUN: 25 mg/dL — ABNORMAL HIGH (ref 6–23)
CO2: 25 mEq/L (ref 19–32)
Calcium: 9.3 mg/dL (ref 8.4–10.5)
Chloride: 105 mEq/L (ref 96–112)
Creatinine, Ser: 1.61 mg/dL — ABNORMAL HIGH (ref 0.40–1.20)
GFR: 31.05 mL/min — ABNORMAL LOW (ref >60.00)
Glucose, Bld: 135 mg/dL — ABNORMAL HIGH (ref 70–99)
Phosphorus: 3.9 mg/dL (ref 2.3–4.6)
Potassium: 4.3 mEq/L (ref 3.5–5.1)
Sodium: 139 mEq/L (ref 135–145)

## 2018-09-29 LAB — HEMOGLOBIN A1C: Hgb A1c MFr Bld: 7.1 % — ABNORMAL HIGH (ref 4.6–6.5)

## 2018-09-29 MED ORDER — DENOSUMAB 60 MG/ML ~~LOC~~ SOSY
60.0000 mg | PREFILLED_SYRINGE | Freq: Once | SUBCUTANEOUS | Status: AC
  Administered 2018-09-29: 60 mg via SUBCUTANEOUS

## 2018-09-29 NOTE — Progress Notes (Signed)
Per orders of Dr. Danise Mina, injection of denosumab (prolia) 60mg /ml to left arm subcutaneously, given by Diamond Nickel, RN. Patient tolerated injection well.

## 2018-10-08 ENCOUNTER — Other Ambulatory Visit: Payer: Self-pay | Admitting: Cardiology

## 2018-10-13 ENCOUNTER — Other Ambulatory Visit: Payer: Self-pay | Admitting: Family Medicine

## 2018-10-13 DIAGNOSIS — F331 Major depressive disorder, recurrent, moderate: Secondary | ICD-10-CM

## 2018-10-14 NOTE — Telephone Encounter (Signed)
Eprescribed.

## 2018-11-04 ENCOUNTER — Other Ambulatory Visit: Payer: Self-pay | Admitting: Family Medicine

## 2018-11-07 NOTE — Progress Notes (Signed)
Kathleen Williamson Keena, MD Primary Care and Sports Medicine [facility] at [facility] [place], [address] Phone: [phone]  FAX: [place] - 78 y.o. female  MRN [id number]  Date of Birth: 1940/06/17  Visit Date: 11/08/2018  PCP: [name], MD  Referred by: [name], MD  Chief Complaint  Patient presents with  . Hip Pain    Right   Subjective:   Kathleen Williamson is a 78 y.o. very pleasant female patient with Body mass index is 26.74 kg/m. who presents with the following:  Presents with R sided hip pain. Cannot really gotten better and worse.  Trouble walking. Can put on pants. Early can do OK, maybe an hour and worse as the day goes on. About 18 months of relief with prior injection.  Groin and posterior  Can only take tylenol.  Past Medical History, Surgical History, Social History, Family History, Problem List, Medications, and Allergies have been reviewed and updated if relevant.  Patient Active Problem List   Diagnosis Date Noted  . Tick bite of chest wall, initial encounter 09/21/2018  . Fear of falling 09/15/2017  . Tongue lesion 02/12/2017  . Osteoarthritis 12/19/2016  . Complicated grief 41/66/0630  . Health maintenance examination 02/07/2016  . Baker's cyst of knee 08/01/2015  . Diabetes mellitus type 2, controlled, with complications ([place]) 16/04/930  . Subclavian steal syndrome   . Medicare annual wellness visit, subsequent 01/31/2015  . Advanced care planning/counseling discussion 01/31/2015  . Carotid stenosis 01/31/2015  . Vertebral artery stenosis/occlusion 01/31/2015  . CKD (chronic kidney disease) stage 3, GFR 30-59 ml/min (HCC) 01/21/2015  . Microscopic colitis 10/26/2014  . Osteoporosis   . Hypothyroidism   . Dermatomyositis ([place])   . MDD (major depressive disorder), recurrent episode, moderate ([place]) 01/12/2014  . Atherosclerosis of native coronary artery of native heart  without angina pectoris 12/13/2013  . Pure hypercholesterolemia 12/13/2013  . Pseudocyst of pancreas 08/29/2011    Past Medical History:  Diagnosis Date  . Arthritis    fingers  . CAD (coronary artery disease) 2003   MI s/p 5v CABG  . Carotid stenosis    [place]  . CKD (chronic kidney disease) stage 3, GFR 30-59 ml/min (HCC)   . Dermatomyositis [facility])    saw Dr Estanislado Pandy, improved on its own  . Diabetes mellitus without complication ([place])   . Forehead laceration 09/15/2017  . History of chicken pox   . History of measles   . History of migraine none since 2003  . HLD (hyperlipidemia)   . Hypothyroidism   . MDD (major depressive disorder), recurrent episode, moderate ([place])    h/o SI after lost husband unexpectedly 2015  . Microscopic colitis 05/2014   by colonoscopy, lymphocytic, zoloft related  . Osteoporosis    DEXA 03/2013 T -3.1, DEXA 10//2016 -2.9 hip, -2.2 spine  . Pancreatic cyst    Dr Ralene Ok, no f/u needed  . Prediabetes 2014  . Subclavian steal syndrome   . Urine incontinence     Past Surgical History:  Procedure Laterality Date  . APPENDECTOMY  1997   with hysterectomy  . [place]   lower  . BREAST LUMPECTOMY Left 1977   benign  . COLONOSCOPY WITH PROPOFOL N/A 05/23/2014   microscopic colitis - Garlan Fair, MD  . CORONARY ARTERY BYPASS GRAFT  2003   x 5 v  . ESOPHAGOGASTRODUODENOSCOPY (EGD) WITH PROPOFOL N/A 05/23/2014   WNL  Garlan Fair, MD  . ROTATOR CUFF REPAIR Right [phone]  . TOOTH EXTRACTION  yrs ago  . TOTAL ABDOMINAL HYSTERECTOMY W/ BILATERAL SALPINGOOPHORECTOMY  1997   endometriosis    Social History   Socioeconomic History  . Marital status: Widowed    Spouse name: Not on file  . Number of children: Not on file  . Years of education: Not on file  . Highest education level: Not on file  Occupational History  . Not on file  Social Needs  . Financial resource strain: Not on file  . Food insecurity    Worry: Not on file     Inability: Not on file  . Transportation needs    Medical: Not on file    Non-medical: Not on file  Tobacco Use  . Smoking status: Never Smoker  . Smokeless tobacco: Never Used  Substance and Sexual Activity  . Alcohol use: No  . Drug use: No  . Sexual activity: Not Currently  Lifestyle  . Physical activity    Days per week: Not on file    Minutes per session: Not on file  . Stress: Not on file  Relationships  . Social Herbalist on phone: Not on file    Gets together: Not on file    Attends religious service: Not on file    Active member of club or organization: Not on file    Attends meetings of clubs or organizations: Not on file    Relationship status: Not on file  . Intimate partner violence    Fear of current or ex partner: Not on file    Emotionally abused: Not on file    Physically abused: Not on file    Forced sexual activity: Not on file  Other Topics Concern  . Not on file  Social History Narrative   L[facility] at [facility].   Husband of 20 yrs died suddenly 2013-11-05.   No children, no siblings.    Close friends Bethena Roys and Ace Gins nearby, cousin in [place].   Activity: going to [employer]    Family History  Problem Relation Age of Onset  . CAD Father 62  . CAD Mother 50  . Diabetes Mother   . Rheum arthritis Mother   . Cancer Maternal Aunt        breast  . CAD Other        strong paternal side  . Stroke Neg Hx     Allergies  Allergen Reactions  . Sulfa Antibiotics Nausea Only  . Crestor [Rosuvastatin Calcium] Other (See Comments)    Leg ache    Medication list reviewed and updated in full in [place].  GEN: No fevers, chills. Nontoxic. Primarily MSK c/o today. MSK: Detailed in the HPI GI: tolerating PO intake without difficulty Neuro: No numbness, parasthesias, or tingling associated. Otherwise the pertinent positives of the ROS are noted above.   Objective:   BP 110/60   Pulse 87   Temp (!) 97.2 F (36.2 C) (Temporal)    Ht 5' 2.75" (1.594 m)   Wt 149 lb 12 oz (67.9 kg)   LMP  (LMP Unknown)   BMI 26.74 kg/m    GEN: WDWN, NAD, Non-toxic, Alert & Oriented x 3 HEENT: Atraumatic, Normocephalic.  Ears and Nose: No external deformity. EXTR: No clubbing/cyanosis/edema NEURO: Normal gait.  PSYCH: Normally interactive. Conversant. Not depressed or anxious appearing.  Calm demeanor.   HIP EXAM: SIDE: R ROM: Abduction, Flexion,  Internal and External range of motion: 15 deg with flexion to 90 Pain with terminal IROM and EROM: y GTB: NT SLR: NEG Knees: No effusion FABER: NT REVERSE FABER: NT, neg Piriformis: NT at direct palpation Str: flexion: 3+/5 abduction: 4/5 adduction: 4/5 Strength testing non-tender     Radiology: Dg Hip Unilat With Pelvis 2-3 Views Right  Result Date: 11/09/2018 CLINICAL DATA:  Chronic right hip pain.  Osteoarthritis. EXAM: DG HIP (WITH OR WITHOUT PELVIS) 2-3V RIGHT COMPARISON:  Plain films right hip 06/09/2016. FINDINGS: Bone-on-bone right hip joint space narrowing with subchondral sclerosis and cyst formation is unchanged in appearance. Minimal degenerative disease about the left hip noted. No acute bony or joint abnormality. Soft tissues unremarkable. IMPRESSION: No marked change in advanced right hip osteoarthritis. Electronically Signed   By: Inge Rise M.D.   On: 11/09/2018 08:44     Assessment and Plan:     ICD-10-CM   1. Arthritis of right hip  M16.11 DG FLUORO GUIDED NEEDLE PLC ASPIRATION/INJECTION LOC    DG HIP UNILAT WITH PELVIS 2-3 VIEWS RIGHT   She has advanced hip arthritis.  I am doubtful that anything short of the total hip arthroplasty would help this patient long-term.  For now she wants to be conservative, so I will set her up for a fluoroscopy guided hip injection.  Follow-up: No follow-ups on file.  No orders of the defined types were placed in this encounter.  Orders Placed This Encounter  Procedures  . DG FLUORO GUIDED NEEDLE PLC  ASPIRATION/INJECTION LOC  . DG HIP UNILAT WITH PELVIS 2-3 VIEWS RIGHT    Signed,  Adonis Yim T. Lexiana Spindel, MD   Outpatient Encounter Medications as of 11/08/2018  Medication Sig  . aspirin EC 81 MG tablet Take 81 mg by mouth at bedtime.  . Biotin 5 MG CAPS Take 1 capsule by mouth daily.  . Calcium Carbonate-Vitamin D 600-400 MG-UNIT tablet Take 1 tablet by mouth 2 (two) times daily.  . citalopram (CELEXA) 20 MG tablet TAKE 1 TABLET BY MOUTH EVERY DAY  . clonazePAM (KLONOPIN) 0.5 MG tablet TAKE 1 TABLET BY MOUTH TWICE A DAY AS NEEDED  . Coenzyme Q10 (COQ10) 100 MG CAPS Take 1 capsule by mouth at bedtime.   Marland Kitchen ezetimibe (ZETIA) 10 MG tablet TAKE 1 TABLET BY MOUTH ONCE A DAY  . Glucosamine-Chondroitin (MOVE FREE PO) Take 2 tablets by mouth daily.  Marland Kitchen levothyroxine (SYNTHROID) 50 MCG tablet TAKE 1 TABLET BY MOUTH EVERY DAY  . loperamide (IMODIUM A-D) 2 MG tablet Take 2 mg by mouth every morning.  . mirtazapine (REMERON) 15 MG tablet TAKE 1 TABLET BY MOUTH EVERYDAY AT BEDTIME  . nitroGLYCERIN (NITROLINGUAL) 0.4 MG/SPRAY spray Place 1 spray under the tongue every 5 (five) minutes as needed.  Glory Rosebush DELICA LANCETS 03J MISC 1 each by Does not apply route as directed. Use as instructed to check sugar once a day.  Dx code:  E11.21  Glory Rosebush VERIO test strip USE AS INSTRUCTED TO CHECK SUGAR ONCE A DAY. DX CODE: E11.21  . pravastatin (PRAVACHOL) 80 MG tablet TAKE 1 TABLET BY MOUTH EVERY EVENING  . venlafaxine XR (EFFEXOR-XR) 75 MG 24 hr capsule TAKE 1 CAPSULE (75 MG TOTAL) BY MOUTH DAILY WITH BREAKFAST.   No facility-administered encounter medications on file as of 11/08/2018.

## 2018-11-08 ENCOUNTER — Encounter: Payer: Self-pay | Admitting: Family Medicine

## 2018-11-08 ENCOUNTER — Other Ambulatory Visit: Payer: Self-pay

## 2018-11-08 ENCOUNTER — Ambulatory Visit (INDEPENDENT_AMBULATORY_CARE_PROVIDER_SITE_OTHER)
Admission: RE | Admit: 2018-11-08 | Discharge: 2018-11-08 | Disposition: A | Discharge status: Home / Self Care | Payer: PPO | Source: Ambulatory Visit | Attending: Family Medicine | Admitting: Family Medicine

## 2018-11-08 ENCOUNTER — Ambulatory Visit (INDEPENDENT_AMBULATORY_CARE_PROVIDER_SITE_OTHER): Payer: PPO | Admitting: Family Medicine

## 2018-11-08 VITALS — BP 110/60 | HR 87 | Temp 97.2°F | Ht 62.75 in | Wt 149.8 lb

## 2018-11-08 DIAGNOSIS — M1611 Unilateral primary osteoarthritis, right hip: Secondary | ICD-10-CM

## 2018-11-09 ENCOUNTER — Telehealth: Payer: Self-pay | Admitting: Family Medicine

## 2018-11-09 NOTE — Telephone Encounter (Signed)
Spoke with Kathleen Williamson.  Advised her hip x-ray was released to her MyChart and those results were discussed with patient as her office visit yesterday.  Awaiting DG FluoroGuided Needle Injection to be scheduled.  Patient states understanding.

## 2018-11-09 NOTE — Telephone Encounter (Signed)
Patient stated she received a my chart notification that she has new results. She is unable to get on to see what the results are. Patient stated she had a visit with Dr Lorelei Pont yesterday and wonders if it could be from that visit.  Patient requested a call back   C/B # [phone]

## 2018-11-12 ENCOUNTER — Other Ambulatory Visit: Payer: Self-pay | Admitting: Family Medicine

## 2018-11-12 DIAGNOSIS — F331 Major depressive disorder, recurrent, moderate: Secondary | ICD-10-CM

## 2018-11-12 NOTE — Telephone Encounter (Signed)
Name of Medication: Clonazepam Name of Pharmacy: [facility] or Written Date and Quantity: 10/14/18, #60 Last Office Visit and Type: 11/08/18, acute Next Office Visit and Type: 02/22/19, CPE Pt 2 Last Controlled Substance Agreement Date: 12/19/16 Last UDS: 12/19/16

## 2018-11-12 NOTE — Telephone Encounter (Signed)
Eprescribed.

## 2018-11-15 NOTE — Telephone Encounter (Signed)
Patient called to follow up on Injections for her hips. She stated she would like to know when she will be scheduled for this   C/B # [phone]

## 2018-11-15 NOTE — Telephone Encounter (Signed)
Called Roberta at [facility] and she will be calling the patient to get her scheduled today.

## 2018-11-22 ENCOUNTER — Ambulatory Visit
Admission: RE | Admit: 2018-11-22 | Discharge: 2018-11-22 | Disposition: A | Discharge status: Home / Self Care | Payer: PPO | Source: Ambulatory Visit | Attending: Family Medicine | Admitting: Family Medicine

## 2018-11-22 DIAGNOSIS — M1611 Unilateral primary osteoarthritis, right hip: Secondary | ICD-10-CM

## 2018-11-22 MED ORDER — IOPAMIDOL (ISOVUE-M 200) INJECTION 41%: 1.0000 mL | Freq: Once | INTRAMUSCULAR | Status: DC

## 2018-11-22 MED ORDER — METHYLPREDNISOLONE ACETATE 40 MG/ML INJ SUSP (RADIOLOG: 120.0000 mg | Freq: Once | INTRAMUSCULAR | Status: DC

## 2018-12-11 ENCOUNTER — Other Ambulatory Visit: Payer: Self-pay | Admitting: Family Medicine

## 2018-12-11 DIAGNOSIS — F331 Major depressive disorder, recurrent, moderate: Secondary | ICD-10-CM

## 2018-12-13 NOTE — Telephone Encounter (Signed)
LOV 09/27/2018 with Dr Darnell Level. Last filled on 11/12/2018 with 0 refill. Next appointment on 02/22/2019

## 2018-12-14 NOTE — Telephone Encounter (Signed)
Sent. Thanks.   

## 2018-12-27 ENCOUNTER — Other Ambulatory Visit: Payer: Self-pay | Admitting: Family Medicine

## 2019-01-12 ENCOUNTER — Other Ambulatory Visit: Payer: Self-pay | Admitting: Family Medicine

## 2019-01-12 DIAGNOSIS — F331 Major depressive disorder, recurrent, moderate: Secondary | ICD-10-CM

## 2019-01-12 NOTE — Telephone Encounter (Signed)
Name of Medication: Clonazepam Name of Pharmacy: [facility] or Written Date and Quantity: 12/14/18, #60 Last Office Visit and Type: 09/27/18, MDD f/u Next Office Visit and Type: none Last Controlled Substance Agreement Date: 12/19/16 Last UDS: 12/19/16

## 2019-01-14 NOTE — Telephone Encounter (Signed)
ERx 

## 2019-02-11 ENCOUNTER — Other Ambulatory Visit: Payer: Self-pay | Admitting: Family Medicine

## 2019-02-11 DIAGNOSIS — F331 Major depressive disorder, recurrent, moderate: Secondary | ICD-10-CM

## 2019-02-11 NOTE — Telephone Encounter (Signed)
Will you address in Dr. Synthia Innocent absence?  Name of Medication: Clonazepam Name of Pharmacy: [facility] or Written Date and Quantity: 01/14/19, #30 Last Office Visit and Type: 11/08/18, arthritis Next Office Visit and Type:  02/22/19, CPE prt 2 Last Controlled Substance Agreement Date: 12/29/16 Last UDS: 12/29/16

## 2019-02-13 NOTE — Telephone Encounter (Signed)
Sent. Thanks.   

## 2019-02-14 ENCOUNTER — Telehealth: Payer: Self-pay

## 2019-02-14 DIAGNOSIS — E118 Type 2 diabetes mellitus with unspecified complications: Secondary | ICD-10-CM

## 2019-02-14 DIAGNOSIS — E039 Hypothyroidism, unspecified: Secondary | ICD-10-CM

## 2019-02-14 DIAGNOSIS — E7800 Pure hypercholesterolemia, unspecified: Secondary | ICD-10-CM

## 2019-02-14 DIAGNOSIS — N1832 Chronic kidney disease, stage 3b: Secondary | ICD-10-CM

## 2019-02-14 NOTE — Addendum Note (Signed)
Addended by: [name] on: 02/14/2019 11:23 PM   Modules accepted: Orders

## 2019-02-14 NOTE — Telephone Encounter (Signed)
Labs ordered for upcoming physical.

## 2019-02-14 NOTE — Telephone Encounter (Signed)
Pt due next Prolia injection 03-31-2019.  Needs calcium level.  Can you please put in order?

## 2019-02-17 ENCOUNTER — Other Ambulatory Visit: Payer: Self-pay | Admitting: Family Medicine

## 2019-02-17 DIAGNOSIS — M810 Age-related osteoporosis without current pathological fracture: Secondary | ICD-10-CM

## 2019-02-17 DIAGNOSIS — N1832 Chronic kidney disease, stage 3b: Secondary | ICD-10-CM

## 2019-02-17 DIAGNOSIS — E039 Hypothyroidism, unspecified: Secondary | ICD-10-CM

## 2019-02-17 DIAGNOSIS — M3390 Dermatopolymyositis, unspecified, organ involvement unspecified: Secondary | ICD-10-CM

## 2019-02-17 DIAGNOSIS — E118 Type 2 diabetes mellitus with unspecified complications: Secondary | ICD-10-CM

## 2019-02-18 ENCOUNTER — Other Ambulatory Visit (INDEPENDENT_AMBULATORY_CARE_PROVIDER_SITE_OTHER): Payer: PPO

## 2019-02-18 ENCOUNTER — Other Ambulatory Visit: Payer: PPO

## 2019-02-18 ENCOUNTER — Ambulatory Visit: Payer: Self-pay

## 2019-02-18 ENCOUNTER — Other Ambulatory Visit: Payer: Self-pay

## 2019-02-18 DIAGNOSIS — E7800 Pure hypercholesterolemia, unspecified: Secondary | ICD-10-CM | POA: Diagnosis not present

## 2019-02-18 DIAGNOSIS — N1832 Chronic kidney disease, stage 3b: Secondary | ICD-10-CM

## 2019-02-18 DIAGNOSIS — E118 Type 2 diabetes mellitus with unspecified complications: Secondary | ICD-10-CM | POA: Diagnosis not present

## 2019-02-18 DIAGNOSIS — E039 Hypothyroidism, unspecified: Secondary | ICD-10-CM | POA: Diagnosis not present

## 2019-02-18 LAB — COMPREHENSIVE METABOLIC PANEL
ALT: 9 U/L (ref 0–35)
AST: 11 U/L (ref 0–37)
Albumin: 4.1 g/dL (ref 3.5–5.2)
Alkaline Phosphatase: 45 U/L (ref 39–117)
BUN: 21 mg/dL (ref 6–23)
CO2: 25 mEq/L (ref 19–32)
Calcium: 9.3 mg/dL (ref 8.4–10.5)
Chloride: 105 mEq/L (ref 96–112)
Creatinine, Ser: 1.47 mg/dL — ABNORMAL HIGH (ref 0.40–1.20)
GFR: 34.45 mL/min — ABNORMAL LOW (ref >60.00)
Glucose, Bld: 149 mg/dL — ABNORMAL HIGH (ref 70–99)
Potassium: 4.5 mEq/L (ref 3.5–5.1)
Sodium: 139 mEq/L (ref 135–145)
Total Bilirubin: 0.5 mg/dL (ref 0.2–1.2)
Total Protein: 6.7 g/dL (ref 6.0–8.3)

## 2019-02-18 LAB — CBC WITH DIFFERENTIAL/PLATELET
Basophils Absolute: 0.1 10*3/uL (ref 0.0–0.1)
Basophils Relative: 0.9 % (ref 0.0–3.0)
Eosinophils Absolute: 0.3 10*3/uL (ref 0.0–0.7)
Eosinophils Relative: 3.8 % (ref 0.0–5.0)
HCT: 37.2 % (ref 36.0–46.0)
Hemoglobin: 12.2 g/dL (ref 12.0–15.0)
Lymphocytes Relative: 33.5 % (ref 12.0–46.0)
Lymphs Abs: 2.2 10*3/uL (ref 0.7–4.0)
MCHC: 32.9 g/dL (ref 30.0–36.0)
MCV: 88.9 fl (ref 78.0–100.0)
Monocytes Absolute: 0.5 10*3/uL (ref 0.1–1.0)
Monocytes Relative: 7.3 % (ref 3.0–12.0)
Neutro Abs: 3.6 10*3/uL (ref 1.4–7.7)
Neutrophils Relative %: 54.5 % (ref 43.0–77.0)
Platelets: 253 10*3/uL (ref 150.0–400.0)
RBC: 4.18 Mil/uL (ref 3.87–5.11)
RDW: 13.4 % (ref 11.5–15.5)
WBC: 6.7 10*3/uL (ref 4.0–10.5)

## 2019-02-18 LAB — LIPID PANEL
Cholesterol: 156 mg/dL (ref 0–200)
HDL: 39.3 mg/dL (ref >39.00)
LDL Cholesterol: 77 mg/dL (ref 0–99)
NonHDL: 116.24
Total CHOL/HDL Ratio: 4
Triglycerides: 194 mg/dL — ABNORMAL HIGH (ref 0.0–149.0)
VLDL: 38.8 mg/dL (ref 0.0–40.0)

## 2019-02-18 LAB — TSH: TSH: 0.93 u[IU]/mL (ref 0.35–4.50)

## 2019-02-18 LAB — MICROALBUMIN / CREATININE URINE RATIO
Creatinine,U: 85 mg/dL
Microalb Creat Ratio: 0.9 mg/g (ref 0.0–30.0)
Microalb, Ur: 0.7 mg/dL (ref 0.0–1.9)

## 2019-02-18 LAB — VITAMIN D 25 HYDROXY (VIT D DEFICIENCY, FRACTURES): VITD: 38.88 ng/mL (ref 30.00–100.00)

## 2019-02-18 LAB — HEMOGLOBIN A1C: Hgb A1c MFr Bld: 7.2 % — ABNORMAL HIGH (ref 4.6–6.5)

## 2019-02-22 ENCOUNTER — Ambulatory Visit: Payer: PPO

## 2019-02-22 ENCOUNTER — Ambulatory Visit (INDEPENDENT_AMBULATORY_CARE_PROVIDER_SITE_OTHER): Payer: PPO | Admitting: Family Medicine

## 2019-02-22 ENCOUNTER — Other Ambulatory Visit: Payer: Self-pay

## 2019-02-22 ENCOUNTER — Encounter: Payer: Self-pay | Admitting: Family Medicine

## 2019-02-22 ENCOUNTER — Ambulatory Visit: Payer: Self-pay

## 2019-02-22 VITALS — BP 126/64 | HR 89 | Temp 98.2°F | Ht 62.5 in | Wt 149.2 lb

## 2019-02-22 DIAGNOSIS — M810 Age-related osteoporosis without current pathological fracture: Secondary | ICD-10-CM

## 2019-02-22 DIAGNOSIS — M159 Polyosteoarthritis, unspecified: Secondary | ICD-10-CM

## 2019-02-22 DIAGNOSIS — E039 Hypothyroidism, unspecified: Secondary | ICD-10-CM

## 2019-02-22 DIAGNOSIS — M3390 Dermatopolymyositis, unspecified, organ involvement unspecified: Secondary | ICD-10-CM

## 2019-02-22 DIAGNOSIS — Z0000 Encounter for general adult medical examination without abnormal findings: Secondary | ICD-10-CM | POA: Diagnosis not present

## 2019-02-22 DIAGNOSIS — E118 Type 2 diabetes mellitus with unspecified complications: Secondary | ICD-10-CM

## 2019-02-22 DIAGNOSIS — E1169 Type 2 diabetes mellitus with other specified complication: Secondary | ICD-10-CM

## 2019-02-22 DIAGNOSIS — N1832 Chronic kidney disease, stage 3b: Secondary | ICD-10-CM

## 2019-02-22 DIAGNOSIS — Z7189 Other specified counseling: Secondary | ICD-10-CM

## 2019-02-22 DIAGNOSIS — E785 Hyperlipidemia, unspecified: Secondary | ICD-10-CM

## 2019-02-22 DIAGNOSIS — F331 Major depressive disorder, recurrent, moderate: Secondary | ICD-10-CM

## 2019-02-22 NOTE — Assessment & Plan Note (Signed)
Preventative protocols reviewed and updated unless pt declined. Discussed healthy diet and lifestyle.  

## 2019-02-22 NOTE — Assessment & Plan Note (Signed)

## 2019-02-22 NOTE — Assessment & Plan Note (Signed)
Stable period.  

## 2019-02-22 NOTE — Assessment & Plan Note (Signed)
Advanced directive discussion -has completed throughattorney. Will bring [name] copy. Very difficult to complete since husband's passing. Thinking of getting [place] involved.

## 2019-02-22 NOTE — Assessment & Plan Note (Signed)
Reviewed with patient. Overall stable readings.

## 2019-02-22 NOTE — Assessment & Plan Note (Signed)
Predominantly R hip - considering rpt steroid injection.

## 2019-02-22 NOTE — Patient Instructions (Addendum)
I have placed new referral to counselor.  You will be due for prolia mid December 2020 Start using antibiotic ointment to scratch under left eye Bring [name] copy of your living will to update your chart.  Good to see you today.  Return as needed or in 6 months for follow up visit.   Health Maintenance After Age 77 After age 55, you are at a higher risk for certain long-term diseases and infections as well as injuries from falls. Falls are a major cause of broken bones and head injuries in people who are older than age 51. Getting regular preventive care can help to keep you healthy and well. Preventive care includes getting regular testing and making lifestyle changes as recommended by your health care provider. Talk with your health care provider about:  Which screenings and tests you should have. A screening is a test that checks for a disease when you have no symptoms.  A diet and exercise plan that is right for you. What should I know about screenings and tests to prevent falls? Screening and testing are the best ways to find a health problem early. Early diagnosis and treatment give you the best chance of managing medical conditions that are common after age 18. Certain conditions and lifestyle choices may make you more likely to have a fall. Your health care provider may recommend:  Regular vision checks. Poor vision and conditions such as cataracts can make you more likely to have a fall. If you wear glasses, make sure to get your prescription updated if your vision changes.  Medicine review. Work with your health care provider to regularly review all of the medicines you are taking, including over-the-counter medicines. Ask your health care provider about any side effects that may make you more likely to have a fall. Tell your health care provider if any medicines that you take make you feel dizzy or sleepy.  Osteoporosis screening. Osteoporosis is a condition that causes the bones to get  weaker. This can make the bones weak and cause them to break more easily.  Blood pressure screening. Blood pressure changes and medicines to control blood pressure can make you feel dizzy.  Strength and balance checks. Your health care provider may recommend certain tests to check your strength and balance while standing, walking, or changing positions.  Foot health exam. Foot pain and numbness, as well as not wearing proper footwear, can make you more likely to have a fall.  Depression screening. You may be more likely to have a fall if you have a fear of falling, feel emotionally low, or feel unable to do activities that you used to do.  Alcohol use screening. Using too much alcohol can affect your balance and may make you more likely to have a fall. What actions can I take to lower my risk of falls? General instructions  Talk with your health care provider about your risks for falling. Tell your health care provider if: ? You fall. Be sure to tell your health care provider about all falls, even ones that seem minor. ? You feel dizzy, sleepy, or off-balance.  Take over-the-counter and prescription medicines only as told by your health care provider. These include any supplements.  Eat a healthy diet and maintain a healthy weight. A healthy diet includes low-fat dairy products, low-fat (lean) meats, and fiber from whole grains, beans, and lots of fruits and vegetables. Home safety  Remove any tripping hazards, such as rugs, cords, and clutter.  Install  safety equipment such as grab bars in bathrooms and safety rails on stairs.  Keep rooms and walkways well-lit. Activity   Follow a regular exercise program to stay fit. This will help you maintain your balance. Ask your health care provider what types of exercise are appropriate for you.  If you need a cane or walker, use it as recommended by your health care provider.  Wear supportive shoes that have nonskid soles. Lifestyle  Do  not drink alcohol if your health care provider tells you not to drink.  If you drink alcohol, limit how much you have: ? 0-1 drink a day for women. ? 0-2 drinks a day for men.  Be aware of how much alcohol is in your drink. In the [place] one drink equals one typical bottle of beer (12 oz), one-half glass of wine (5 oz), or one shot of hard liquor (1 oz).  Do not use any products that contain nicotine or tobacco, such as cigarettes and e-cigarettes. If you need help quitting, ask your health care provider. Summary  Having a healthy lifestyle and getting preventive care can help to protect your health and wellness after age 63.  Screening and testing are the best way to find a health problem early and help you avoid having a fall. Early diagnosis and treatment give you the best chance for managing medical conditions that are more common for people who are older than age 43.  Falls are a major cause of broken bones and head injuries in people who are older than age 77. Take precautions to prevent a fall at home.  Work with your health care provider to learn what changes you can make to improve your health and wellness and to prevent falls. This information is not intended to replace advice given to you by your health care provider. Make sure you discuss any questions you have with your health care provider. Document Released: 02/11/2017 Document Revised: 07/22/2018 Document Reviewed: 02/11/2017 Elsevier Patient Education  2020 [employer].

## 2019-02-22 NOTE — Progress Notes (Signed)
This visit was conducted in person.  BP 126/64 (BP Location: Left Arm, Patient Position: Sitting, Cuff Size: Normal)   Pulse 89   Temp 98.2 F (36.8 C) (Temporal)   Ht 5' 2.5" (1.588 m)   Wt 149 lb 4 oz (67.7 kg)   LMP  (LMP Unknown)   SpO2 98%   BMI 26.86 kg/m    CC: AMW Subjective:    Patient ID: Kathleen Williamson, female    DOB: [birth year], 78 y.o.   MRN: [id number]  HPI: Kathleen Williamson is a 78 y.o. female presenting on 02/22/2019 for Medicare Wellness   Did not see health advisor this year.   Hearing Screening   125Hz  250Hz  500Hz  1000Hz  2000Hz  3000Hz  4000Hz  6000Hz  8000Hz   Right ear:   40 0 40  0    Left ear:   25 40 40  40      Visual Acuity Screening   Right eye Left eye Both eyes  Without correction:     With correction: 20/30 20/40 20/40   Comments: Next eye exam, 04/2019.     Office Visit from 02/22/2019 in [place] at [facility]  PHQ-2 Total Score  6      Fall Risk  02/22/2019 02/16/2018 06/29/2017 06/22/2017 06/15/2017  Falls in the past year? 0 1 No (No Data) No  Comment - fell after tripping over broken concrete on sidewalk; loss of consciousness and head trauma - no falls since last visit -  Number falls in past yr: - 0 - - -  Injury with Fall? - 1 - - -      MDD - struggling with pandemic isolation. On effexor, celexa, remeron, klonopin. Over summer referred back to counselor - she was unable to be scheduled, doesn't remember receiving call to schedule. Stopped volunteering at [facility] due to [phone].   Known R hip arthritis s/p steroid injection 10/2018 with 2 months of benefit, was told would need hip replacement. Would like to try rpt injection first.   Preventative: Colonoscopy 05/23/2014 microscopic colitis attributed to zoloft (Dr Wynetta Emery at [place])  Breast cancer screening - 06/2018 WNL [facility]) Well woman with pap 03/2014 ([facility]) - will not return. S/p complete hysterectomy with B oophorectomy for endometriosis.  Lung cancer screening - never  smoker DEXA scan - 01/2015 -2.9 hip, -2.2 spine. Does not want bisphosphonate.She has not been on treatment for this. She does take calcium and vitamin D regularly.Dexa 2019 - T score -2.6 (improvement). Now on prolia Q6 mo and tolerating well last dose 09/29/2018.  Flu shotyearly  Td - 2008, Tdap 2019  Pneumovax2008, prevnar 2017 zostavax- 2011 shingrix - completed 2019  Advanced directive discussion -has completed throughattorney. Will bring [name] copy. Very difficult to complete since husband's passing. Thinking of getting [place] involved.  Seat belt use discussed  Sunscreen use discussed, no changing moles on skin Non smoker Alcohol - none Dentist q6 mo  Eye exam yearly  Bowel - no constipation  Bladder - some urge incontinence   L[facility] at [facility]. Husband of 22 yrs died suddenly 11/01/2013.  No children, no siblings.  Close friends Bethena Roys and Ace Gins nearby, cousin in [place]. [place]: [place] Diet: poor - lots of frozen dinners, eating out     Relevant past medical, surgical, family and social history reviewed and updated as indicated. Interim medical history since our last visit reviewed. Allergies and medications reviewed and updated. Outpatient Medications Prior to Visit  Medication Sig  Dispense Refill  . aspirin EC 81 MG tablet Take 81 mg by mouth at bedtime.    . Biotin 5 MG CAPS Take 1 capsule by mouth daily.    . Calcium Carbonate-Vitamin D 600-400 MG-UNIT tablet Take 1 tablet by mouth 2 (two) times daily.    . citalopram (CELEXA) 20 MG tablet TAKE 1 TABLET BY MOUTH EVERY DAY 90 tablet 0  . clonazePAM (KLONOPIN) 0.5 MG tablet TAKE 1 TABLET BY MOUTH TWICE A DAY AS NEEDED 60 tablet 0  . Coenzyme Q10 (COQ10) 100 MG CAPS Take 1 capsule by mouth at bedtime.     Marland Kitchen ezetimibe (ZETIA) 10 MG tablet TAKE 1 TABLET BY MOUTH ONCE A DAY 90 tablet 3  . Glucosamine-Chondroitin (MOVE FREE PO) Take 2 tablets by mouth daily.    Marland Kitchen levothyroxine  (SYNTHROID) 50 MCG tablet TAKE 1 TABLET BY MOUTH EVERY DAY 90 tablet 2  . loperamide (IMODIUM A-D) 2 MG tablet Take 2 mg by mouth every morning.    . mirtazapine (REMERON) 15 MG tablet TAKE 1 TABLET BY MOUTH EVERYDAY AT BEDTIME 90 tablet 0  . nitroGLYCERIN (NITROLINGUAL) 0.4 MG/SPRAY spray Place 1 spray under the tongue every 5 (five) minutes as needed. 12 g prn  . ONETOUCH DELICA LANCETS 99991111 MISC 1 each by Does not apply route as directed. Use as instructed to check sugar once a day.  Dx code:  E11.21 100 each 3  . ONETOUCH VERIO test strip USE AS INSTRUCTED TO CHECK SUGAR ONCE A DAY. DX CODE: E11.21 100 each 3  . pravastatin (PRAVACHOL) 80 MG tablet TAKE 1 TABLET BY MOUTH EVERY EVENING 90 tablet 3  . venlafaxine XR (EFFEXOR-XR) 75 MG 24 hr capsule TAKE 1 CAPSULE (75 MG TOTAL) BY MOUTH DAILY WITH BREAKFAST. 90 capsule 3   No facility-administered medications prior to visit.      Per HPI unless specifically indicated in ROS section below Review of Systems  Constitutional: Negative for activity change, appetite change, chills, fatigue, fever and unexpected weight change.  HENT: Negative for hearing loss.   Eyes: Negative for visual disturbance.  Respiratory: Negative for cough, chest tightness, shortness of breath and wheezing.   Cardiovascular: Negative for chest pain, palpitations and leg swelling.  Gastrointestinal: Negative for abdominal distention, abdominal pain, blood in stool, constipation, diarrhea, nausea and vomiting.  Genitourinary: Positive for urgency (mild urge incontinence). Negative for difficulty urinating and hematuria.  Musculoskeletal: Negative for arthralgias, myalgias and neck pain.  Skin: Negative for rash.  Neurological: Negative for dizziness, seizures, syncope and headaches.  Hematological: Negative for adenopathy. Does not bruise/bleed easily.  Psychiatric/Behavioral: Positive for dysphoric mood. The patient is nervous/anxious.    Objective:    BP 126/64 (BP  Location: Left Arm, Patient Position: Sitting, Cuff Size: Normal)   Pulse 89   Temp 98.2 F (36.8 C) (Temporal)   Ht 5' 2.5" (1.588 m)   Wt 149 lb 4 oz (67.7 kg)   LMP  (LMP Unknown)   SpO2 98%   BMI 26.86 kg/m   Wt Readings from Last 3 Encounters:  02/22/19 149 lb 4 oz (67.7 kg)  11/08/18 149 lb 12 oz (67.9 kg)  09/27/18 147 lb (66.7 kg)    Physical Exam Vitals signs and nursing note reviewed.  Constitutional:      General: She is not in acute distress.    Appearance: Normal appearance. She is well-developed. She is not ill-appearing.  HENT:     Head: Normocephalic and atraumatic.  Right Ear: Hearing, tympanic membrane, ear canal and external ear normal.     Left Ear: Hearing, tympanic membrane, ear canal and external ear normal.     Nose: Nose normal.     Mouth/Throat:     Mouth: Mucous membranes are moist.     Pharynx: Oropharynx is clear. Uvula midline. No posterior oropharyngeal erythema.  Eyes:     General: No scleral icterus.    Extraocular Movements: Extraocular movements intact.     Conjunctiva/sclera: Conjunctivae normal.     Pupils: Pupils are equal, round, and reactive to light.     Comments: Scratch under L eye with mild surrounding erythema  Neck:     Musculoskeletal: Normal range of motion and neck supple.     Vascular: No carotid bruit.  Cardiovascular:     Rate and Rhythm: Normal rate and regular rhythm.     Pulses: Normal pulses.          Radial pulses are 2+ on the right side and 2+ on the left side.     Heart sounds: Normal heart sounds. No murmur.  Pulmonary:     Effort: Pulmonary effort is normal. No respiratory distress.     Breath sounds: Normal breath sounds. No wheezing, rhonchi or rales.  Abdominal:     General: Abdomen is flat. Bowel sounds are normal. There is no distension.     Palpations: Abdomen is soft. There is no mass.     Tenderness: There is no abdominal tenderness. There is no guarding or rebound.     Hernia: No hernia is  present.  Musculoskeletal: Normal range of motion.  Lymphadenopathy:     Cervical: No cervical adenopathy.  Skin:    General: Skin is warm and dry.     Findings: No rash.  Neurological:     General: No focal deficit present.     Mental Status: She is alert and oriented to person, place, and time.     Comments:  CN grossly intact, station and gait intact Recall 3/3  Calculation 5/5 DLROW  Psychiatric:        Mood and Affect: Mood normal.        Behavior: Behavior normal.        Thought Content: Thought content normal.        Judgment: Judgment normal.       Results for orders placed or performed in visit on 02/18/19  Lipid panel  Result Value Ref Range   Cholesterol 156 0 - 200 mg/dL   Triglycerides 194.0 (H) 0.0 - 149.0 mg/dL   HDL 39.30 >39.00 mg/dL   VLDL 38.8 0.0 - 40.0 mg/dL   LDL Cholesterol 77 0 - 99 mg/dL   Total CHOL/HDL Ratio 4    NonHDL 116.24   CBC with Differential/Platelet  Result Value Ref Range   WBC 6.7 4.0 - 10.5 K/uL   RBC 4.18 3.87 - 5.11 Mil/uL   Hemoglobin 12.2 12.0 - 15.0 g/dL   HCT 37.2 36.0 - 46.0 %   MCV 88.9 78.0 - 100.0 fl   MCHC 32.9 30.0 - 36.0 g/dL   RDW 13.4 11.5 - 15.5 %   Platelets 253.0 150.0 - 400.0 K/uL   Neutrophils Relative % 54.5 43.0 - 77.0 %   Lymphocytes Relative 33.5 12.0 - 46.0 %   Monocytes Relative 7.3 3.0 - 12.0 %   Eosinophils Relative 3.8 0.0 - 5.0 %   Basophils Relative 0.9 0.0 - 3.0 %   Neutro Abs 3.6 1.4 -  7.7 K/uL   Lymphs Abs 2.2 0.7 - 4.0 K/uL   Monocytes Absolute 0.5 0.1 - 1.0 K/uL   Eosinophils Absolute 0.3 0.0 - 0.7 K/uL   Basophils Absolute 0.1 0.0 - 0.1 K/uL  TSH  Result Value Ref Range   TSH 0.93 0.35 - 4.50 uIU/mL  Microalbumin / creatinine urine ratio  Result Value Ref Range   Microalb, Ur 0.7 0.0 - 1.9 mg/dL   Creatinine,U 85.0 mg/dL   Microalb Creat Ratio 0.9 0.0 - 30.0 mg/g  VITAMIN D 25 Hydroxy (Vit-D Deficiency, Fractures)  Result Value Ref Range   VITD 38.88 30.00 - 100.00 ng/mL   Hemoglobin A1c  Result Value Ref Range   Hgb A1c MFr Bld 7.2 (H) 4.6 - 6.5 %  Comprehensive metabolic panel  Result Value Ref Range   Sodium 139 135 - 145 mEq/L   Potassium 4.5 3.5 - 5.1 mEq/L   Chloride 105 96 - 112 mEq/L   CO2 25 19 - 32 mEq/L   Glucose, Bld 149 (H) 70 - 99 mg/dL   BUN 21 6 - 23 mg/dL   Creatinine, Ser 1.47 (H) 0.40 - 1.20 mg/dL   Total Bilirubin 0.5 0.2 - 1.2 mg/dL   Alkaline Phosphatase 45 39 - 117 U/L   AST 11 0 - 37 U/L   ALT 9 0 - 35 U/L   Total Protein 6.7 6.0 - 8.3 g/dL   Albumin 4.1 3.5 - 5.2 g/dL   GFR 34.45 (L) >60.00 mL/min   Calcium 9.3 8.4 - 10.5 mg/dL   Depression screen [facility] 2/9 02/22/2019 02/16/2018 09/15/2017 05/19/2017 02/10/2017  Decreased Interest 3 2 1 3 3   Down, Depressed, Hopeless 3 1 2 1 3   PHQ - 2 Score 6 3 3 4 6   Altered sleeping 0 0 0 0 0  Tired, decreased energy 1 1 2 3  0  Change in appetite 1 1 2 3 1   Feeling bad or failure about yourself  1 1 1  0 0  Trouble concentrating 0 0 1 0 0  Moving slowly or fidgety/restless 0 0 1 0 0  Suicidal thoughts 0 0 0 1 1  PHQ-9 Score 9 6 10 11 8   Difficult doing work/chores - Not difficult at all - Not difficult at all Not difficult at all  Some recent data might be hidden    GAD 7 : Generalized Anxiety Score 02/22/2019 09/15/2017  Nervous, Anxious, on Edge 1 2  Control/stop worrying 1 2  Worry too much - different things 1 2  Trouble relaxing 1 3  Restless 0 2  Easily annoyed or irritable 0 2  Afraid - awful might happen 0 2  Total GAD 7 Score 4 15   Assessment & Plan:   Problem List Items Addressed This Visit    Osteoporosis    Chronic, stable on prolia - continue       Osteoarthritis    Predominantly R hip - considering rpt steroid injection.       Medicare annual wellness visit, subsequent - Primary    I have personally reviewed the Medicare Annual Wellness questionnaire and have noted 1. The patient's medical and social history 2. Their use of alcohol, tobacco or illicit drugs  3. Their current medications and supplements 4. The patient's functional ability including ADL's, fall risks, home safety risks and hearing or visual impairment. Cognitive function has been assessed and addressed as indicated.  5. Diet and physical activity 6. Evidence for depression or mood disorders The patients  weight, height, BMI have been recorded in the chart. I have made referrals, counseling and provided education to the patient based on review of the above and I have provided the pt with a written personalized care plan for preventive services. Provider list updated.. See scanned questionairre as needed for further documentation. Reviewed preventative protocols and updated unless pt declined.       MDD (major depressive disorder), recurrent episode, moderate (HCC)    Chronic, stable period on current regimen, pt does not want to make changes at this time.  Will re refer for counseling.       Relevant Orders   Ambulatory referral to Psychology   Hypothyroidism    Chronic, stable. Continue current levothyroxine dose.       Hyperlipidemia associated with type 2 diabetes mellitus (HCC)    Chronic, stable on pravastatin and zetia.  The 10-year ASCVD risk score Mikey Bussing DC Brooke Bonito., et al., 2013) is: 32.8%   Values used to calculate the score:     Age: 63 years     Sex: Female     Is Non-Hispanic African American: No     Diabetic: Yes     Tobacco smoker: No     Systolic Blood Pressure: 123XX123 mmHg     Is BP treated: No     HDL Cholesterol: 39.3 mg/dL     Total Cholesterol: 156 mg/dL       Health maintenance examination    Preventative protocols reviewed and updated unless pt declined. Discussed healthy diet and lifestyle.       Diabetes mellitus type 2, controlled, with complications ([place])    Chronic, adequate diet controlled.      Dermatomyositis ([place])    Stable period.       CKD (chronic kidney disease) stage 3, GFR 30-59 ml/min    Reviewed with patient. Overall stable  readings.       Advanced care planning/counseling discussion    Advanced directive discussion -has completed throughattorney. Will bring [name] copy. Very difficult to complete since husband's passing. Thinking of getting [place] involved.           No orders of the defined types were placed in this encounter.  Orders Placed This Encounter  Procedures  . Ambulatory referral to Psychology    Referral Priority:   Routine    Referral Type:   Psychiatric    Referral Reason:   Specialty Services Required    Requested Specialty:   Psychology    Number of Visits Requested:   1   Patient instructions: I have placed new referral to counselor.  You will be due for prolia mid December 2020 Start using antibiotic ointment to scratch under left eye Bring [name] copy of your living will to update your chart.  Good to see you today.  Return as needed or in 6 months for follow up visit.   Follow up plan: Return in about 6 months (around 08/22/2019) for follow up visit.  [name], MD

## 2019-02-22 NOTE — Assessment & Plan Note (Signed)
Chronic, stable on prolia - continue

## 2019-02-22 NOTE — Assessment & Plan Note (Signed)
Chronic, stable. Continue current levothyroxine dose.  

## 2019-02-22 NOTE — Assessment & Plan Note (Signed)
Chronic, stable on pravastatin and zetia.  The 10-year ASCVD risk score Mikey Bussing DC Brooke Bonito., et al., 2013) is: 32.8%   Values used to calculate the score:     Age: 78 years     Sex: Female     Is Non-Hispanic African American: No     Diabetic: Yes     Tobacco smoker: No     Systolic Blood Pressure: 123XX123 mmHg     Is BP treated: No     HDL Cholesterol: 39.3 mg/dL     Total Cholesterol: 156 mg/dL

## 2019-02-22 NOTE — Assessment & Plan Note (Addendum)
Chronic, stable period on current regimen, pt does not want to make changes at this time.  Will re refer for counseling.

## 2019-02-22 NOTE — Assessment & Plan Note (Signed)
Chronic, adequate diet controlled.

## 2019-02-25 ENCOUNTER — Encounter: Payer: Self-pay | Admitting: Family Medicine

## 2019-03-01 ENCOUNTER — Other Ambulatory Visit: Payer: Self-pay

## 2019-03-01 ENCOUNTER — Encounter: Payer: Self-pay | Admitting: Podiatry

## 2019-03-01 ENCOUNTER — Ambulatory Visit: Payer: PPO | Admitting: Podiatry

## 2019-03-01 VITALS — BP 138/56 | HR 90

## 2019-03-01 DIAGNOSIS — B351 Tinea unguium: Secondary | ICD-10-CM

## 2019-03-01 DIAGNOSIS — M79674 Pain in right toe(s): Secondary | ICD-10-CM

## 2019-03-01 DIAGNOSIS — M21619 Bunion of unspecified foot: Secondary | ICD-10-CM

## 2019-03-01 DIAGNOSIS — E118 Type 2 diabetes mellitus with unspecified complications: Secondary | ICD-10-CM | POA: Diagnosis not present

## 2019-03-01 DIAGNOSIS — M205X9 Other deformities of toe(s) (acquired), unspecified foot: Secondary | ICD-10-CM | POA: Diagnosis not present

## 2019-03-01 DIAGNOSIS — M79675 Pain in left toe(s): Secondary | ICD-10-CM

## 2019-03-01 NOTE — Progress Notes (Signed)
  Subjective:  Patient ID: SAGAN DESILVA, female    DOB: 1940/12/19,  MRN: [id number]  Chief Complaint  Patient presents with  . Nail Problem    trim  . Diabetes    B/ l feet exam   78 y.o. female returns for the above complaint.  Patient presents with painful toenails x10.  She states that it hurts when she is ambulating.  She states that she has been walking a lot especially since pandemic.  She denies any other acute complaints she states she ambulates in regular sneakers.  She denies having diabetic shoes.  She denies seeing any other podiatrist for this.  She was recently diagnosed about 3 years ago with diabetes with diet controlled.  Her sugars have been well controlled in the 130s.  She denies any nausea fever chills vomiting.  Objective:   Vitals:   03/01/19 0954  BP: (!) 138/56  Pulse: 90   Podiatric Exam: Vascular: dorsalis pedis and posterior tibial pulses are palpable bilateral. Capillary return is immediate. Temperature gradient is WNL. Skin turgor WNL  Sensorium: Normal Semmes Weinstein monofilament test. Normal tactile sensation bilaterally. Nail Exam: Pt has thick disfigured discolored nails with subungual debris noted bilateral entire nail hallux through fifth toenails Ulcer Exam: There is no evidence of ulcer or pre-ulcerative changes or infection. Orthopedic Exam: Muscle tone and strength are WNL. No limitations in general ROM. No crepitus or effusions noted. HAV  B/L.  Hammer toes 2-5  B/L. Skin: No Porokeratosis. No infection or ulcers  Assessment & Plan:  Patient was evaluated and treated and all questions answered.  Onychomycosis with pain  -Nails palliatively debrided as below. -Educated on self-care  -I believe patient will benefit from diabetic insoles given that she has a bunion deformity bilaterally with hammertoe contracture.  I will have her see Liliane Channel to have the diabetic insoles made.  Procedure: Nail Debridement Rationale: pain  Type of  Debridement: manual, sharp debridement. Instrumentation: Nail nipper, rotary burr. Number of Nails: 10  Procedures and Treatment: Consent by patient was obtained for treatment procedures. The patient understood the discussion of treatment and procedures well. All questions were answered thoroughly reviewed. Debridement of mycotic and hypertrophic toenails, 1 through 5 bilateral and clearing of subungual debris. No ulceration, no infection noted.  Return Visit-Office Procedure: Patient instructed to return to the office for a follow up visit 3 months for continued evaluation and treatment.  Boneta Lucks, DPM    Return in about 1 week (around 03/08/2019) for Sched with Liliane Channel for Diabetic shoes/insoles.

## 2019-03-02 ENCOUNTER — Telehealth: Payer: Self-pay

## 2019-03-02 NOTE — Telephone Encounter (Signed)
Discussed Prolia benefits w/pt.  Pt would owe approximately $255.  Pt understands and agrees. 

## 2019-03-04 ENCOUNTER — Other Ambulatory Visit: Payer: PPO | Admitting: Orthotics

## 2019-03-04 ENCOUNTER — Other Ambulatory Visit: Payer: Self-pay

## 2019-03-16 ENCOUNTER — Other Ambulatory Visit: Payer: Self-pay | Admitting: Family Medicine

## 2019-03-16 DIAGNOSIS — F331 Major depressive disorder, recurrent, moderate: Secondary | ICD-10-CM

## 2019-03-16 NOTE — Telephone Encounter (Signed)
Pt called checking on this rx  She stated she has called pharmacy several time to get this refilled  Best number [phone]  Pt is out of her meds

## 2019-03-16 NOTE — Telephone Encounter (Signed)
Noted. Thanks for filling.  We had not received previous request to today.  This should have been sent to me as I'm PCP. plz notify pt this was sent in.

## 2019-03-16 NOTE — Telephone Encounter (Signed)
Sent.  Routed this to PCP as FYI, I went ahead and filled the rx.  Thanks.

## 2019-03-16 NOTE — Telephone Encounter (Signed)
Pt would like to pick up today she stated it is a med she has to have  Please advise when called in

## 2019-03-17 NOTE — Telephone Encounter (Signed)
Left message on vm per dpr notifying pt refill was sent in.  

## 2019-03-22 ENCOUNTER — Ambulatory Visit (INDEPENDENT_AMBULATORY_CARE_PROVIDER_SITE_OTHER): Payer: PPO | Admitting: Psychology

## 2019-03-22 DIAGNOSIS — F4323 Adjustment disorder with mixed anxiety and depressed mood: Secondary | ICD-10-CM | POA: Diagnosis not present

## 2019-03-28 ENCOUNTER — Ambulatory Visit (INDEPENDENT_AMBULATORY_CARE_PROVIDER_SITE_OTHER): Payer: PPO | Admitting: Psychology

## 2019-03-28 DIAGNOSIS — F4323 Adjustment disorder with mixed anxiety and depressed mood: Secondary | ICD-10-CM

## 2019-04-05 ENCOUNTER — Ambulatory Visit (INDEPENDENT_AMBULATORY_CARE_PROVIDER_SITE_OTHER): Payer: PPO | Admitting: *Deleted

## 2019-04-05 DIAGNOSIS — M810 Age-related osteoporosis without current pathological fracture: Secondary | ICD-10-CM | POA: Diagnosis not present

## 2019-04-05 MED ORDER — DENOSUMAB 60 MG/ML ~~LOC~~ SOSY
60.0000 mg | PREFILLED_SYRINGE | Freq: Once | SUBCUTANEOUS | Status: AC
  Administered 2019-04-05: 15:00:00 60 mg via SUBCUTANEOUS

## 2019-04-05 NOTE — Progress Notes (Signed)
Per orders of Dr. Gutierrez, injection of Prolia given by Jewelene Mairena Simpson. °Patient tolerated injection well. °

## 2019-04-11 ENCOUNTER — Other Ambulatory Visit: Payer: Self-pay | Admitting: Family Medicine

## 2019-04-11 DIAGNOSIS — F331 Major depressive disorder, recurrent, moderate: Secondary | ICD-10-CM

## 2019-04-11 NOTE — Telephone Encounter (Signed)
Rx was last refilled on 03/16/19 for #60 with 0 refills. Patient was last seen for an AWV on 02/22/19, and has no upcoming appts. Ok to refill?

## 2019-04-12 NOTE — Telephone Encounter (Signed)
Routed to PCP 

## 2019-04-12 NOTE — Telephone Encounter (Signed)
ERx 

## 2019-04-18 ENCOUNTER — Ambulatory Visit (INDEPENDENT_AMBULATORY_CARE_PROVIDER_SITE_OTHER): Payer: Medicare HMO | Admitting: Psychology

## 2019-04-18 DIAGNOSIS — F4323 Adjustment disorder with mixed anxiety and depressed mood: Secondary | ICD-10-CM

## 2019-05-01 ENCOUNTER — Other Ambulatory Visit: Payer: Self-pay | Admitting: Family Medicine

## 2019-05-02 NOTE — Telephone Encounter (Signed)
Patient was last seen on 02/22/19 for AWV.  Levothyroxine was last refilled on 09/16/18 for #90 with 2 refills.  Celexa was last refilled on 12/28/18 for #90 with 0 refills.  Patient has no upcoming appts. Dr. Darnell Level is this ok to refill?

## 2019-05-10 ENCOUNTER — Telehealth: Payer: Self-pay

## 2019-05-10 NOTE — Telephone Encounter (Signed)
Pt left v/m requesting cb to ask Lattie Haw CMA about med. Left v/m for pt to cb. FYI to [place] and R Taela Charbonneau LPN.

## 2019-05-10 NOTE — Telephone Encounter (Signed)
Noted. She was on 15mg  dose. Have removed from med list.

## 2019-05-10 NOTE — Telephone Encounter (Signed)
Pt wanted Dr Darnell Level to know that she stopped taking the mirtazipine that [facility] had prescribed. She had been having bad dreams and   said she felt "like [place] am [place]". States that she feels better since stopping it.

## 2019-05-13 ENCOUNTER — Telehealth: Payer: Self-pay

## 2019-05-13 ENCOUNTER — Ambulatory Visit (INDEPENDENT_AMBULATORY_CARE_PROVIDER_SITE_OTHER): Payer: Medicare HMO | Admitting: Family Medicine

## 2019-05-13 ENCOUNTER — Other Ambulatory Visit: Payer: Self-pay | Admitting: Family Medicine

## 2019-05-13 ENCOUNTER — Encounter: Payer: Self-pay | Admitting: Family Medicine

## 2019-05-13 ENCOUNTER — Other Ambulatory Visit: Payer: Self-pay

## 2019-05-13 VITALS — BP 122/66 | HR 81 | Temp 97.6°F | Ht 62.5 in | Wt 147.2 lb

## 2019-05-13 DIAGNOSIS — F331 Major depressive disorder, recurrent, moderate: Secondary | ICD-10-CM

## 2019-05-13 MED ORDER — SERTRALINE HCL 100 MG PO TABS: 100.0000 mg | ORAL_TABLET | Freq: Every day | ORAL | 1 refills | Status: DC

## 2019-05-13 NOTE — Telephone Encounter (Signed)
ERx 

## 2019-05-13 NOTE — Progress Notes (Signed)
This visit was conducted in person.  BP 122/66 (BP Location: Left Arm, Patient Position: Sitting, Cuff Size: Normal)   Pulse 81   Temp 97.6 F (36.4 C) (Temporal)   Ht 5' 2.5" (1.588 m)   Wt 147 lb 3 oz (66.8 kg)   LMP  (LMP Unknown)   SpO2 98%   BMI 26.49 kg/m    CC: depression Subjective:    Patient ID: Kathleen Williamson, female    DOB: 1940/09/04, 79 y.o.   MRN: [id number]  HPI: Kathleen Williamson is a 79 y.o. female presenting on 05/13/2019 for Depression (C/o increaed depression. States current meds are not helping. ) and Discuss Medication (Wants to discuss calcium + D.)   Longstanding struggle with depression worse after husband's death.  Longstanding on effexor XR 75mg  daily and celexa 20mg  daily. She stopped remeron 15mg  due to bad dreams - has actually restarted this.  She managed pandemic well until this winter - struggling with social isolation. Doesn't have close family. Feeling overwhelmed. She gets to see BIL and SIL once a week - on Thursdays - she really enjoys this.  zoloft previously was beneficial.  No SI/HI.   Referred to counselor 2 months ago - has been seeing Alene Mires - doesn't think this is helping.   She has seen [facility] psychiatry      Relevant past medical, surgical, family and social history reviewed and updated as indicated. Interim medical history since our last visit reviewed. Allergies and medications reviewed and updated. Outpatient Medications Prior to Visit  Medication Sig Dispense Refill  . aspirin EC 81 MG tablet Take 81 mg by mouth at bedtime.    . Biotin 5 MG CAPS Take 1 capsule by mouth daily.    . clonazePAM (KLONOPIN) 0.5 MG tablet TAKE 1 TABLET BY MOUTH TWICE A DAY AS NEEDED 60 tablet 0  . Coenzyme Q10 (COQ10) 100 MG CAPS Take 1 capsule by mouth at bedtime.     Marland Kitchen ezetimibe (ZETIA) 10 MG tablet TAKE 1 TABLET BY MOUTH ONCE A DAY 90 tablet 3  . Glucosamine-Chondroitin (MOVE FREE PO) Take 2 tablets by mouth daily.    Marland Kitchen levothyroxine (SYNTHROID)  50 MCG tablet TAKE 1 TABLET BY MOUTH EVERY DAY 90 tablet 2  . loperamide (IMODIUM A-D) 2 MG tablet Take 2 mg by mouth every morning.    . nitroGLYCERIN (NITROLINGUAL) 0.4 MG/SPRAY spray Place 1 spray under the tongue every 5 (five) minutes as needed. 12 g prn  . ONETOUCH DELICA LANCETS 99991111 MISC 1 each by Does not apply route as directed. Use as instructed to check sugar once a day.  Dx code:  E11.21 100 each 3  . ONETOUCH VERIO test strip USE AS INSTRUCTED TO CHECK SUGAR ONCE A DAY. DX CODE: E11.21 100 each 3  . pravastatin (PRAVACHOL) 80 MG tablet TAKE 1 TABLET BY MOUTH EVERY EVENING 90 tablet 3  . citalopram (CELEXA) 20 MG tablet TAKE 1 TABLET BY MOUTH EVERY DAY 90 tablet 2  . venlafaxine XR (EFFEXOR-XR) 75 MG 24 hr capsule TAKE 1 CAPSULE (75 MG TOTAL) BY MOUTH DAILY WITH BREAKFAST. 90 capsule 3  . Calcium Carbonate-Vitamin D 600-400 MG-UNIT tablet Take 1 tablet by mouth 2 (two) times daily.    . mirtazapine (REMERON) 15 MG tablet Take 1 tablet (15 mg total) by mouth at bedtime.     No facility-administered medications prior to visit.     Per HPI unless specifically indicated in ROS section below Review of  Systems Objective:    BP 122/66 (BP Location: Left Arm, Patient Position: Sitting, Cuff Size: Normal)   Pulse 81   Temp 97.6 F (36.4 C) (Temporal)   Ht 5' 2.5" (1.588 m)   Wt 147 lb 3 oz (66.8 kg)   LMP  (LMP Unknown)   SpO2 98%   BMI 26.49 kg/m   Wt Readings from Last 3 Encounters:  05/13/19 147 lb 3 oz (66.8 kg)  02/22/19 149 lb 4 oz (67.7 kg)  11/08/18 149 lb 12 oz (67.9 kg)    Physical Exam Vitals and nursing note reviewed.  Constitutional:      Appearance: Normal appearance. She is not ill-appearing.  Neurological:     Mental Status: She is alert.  Psychiatric:        Mood and Affect: Mood is depressed.        Speech: Speech normal.        Behavior: Behavior normal.        Thought Content: Thought content normal.        Cognition and Memory: Cognition and memory  normal.       Depression screen [facility] 2/9 05/13/2019 02/22/2019 02/16/2018 09/15/2017 05/19/2017  Decreased Interest 3 3 2 1 3   Down, Depressed, Hopeless 3 3 1 2 1   PHQ - 2 Score 6 6 3 3 4   Altered sleeping 1 0 0 0 0  Tired, decreased energy 3 1 1 2 3   Change in appetite 2 1 1 2 3   Feeling bad or failure about yourself  1 1 1 1  0  Trouble concentrating 1 0 0 1 0  Moving slowly or fidgety/restless 0 0 0 1 0  Suicidal thoughts 0 0 0 0 1  PHQ-9 Score 14 9 6 10 11   Difficult doing work/chores - - Not difficult at all - Not difficult at all  Some recent data might be hidden    GAD 7 : Generalized Anxiety Score 05/13/2019 02/22/2019 09/15/2017  Nervous, Anxious, on Edge 2 1 2   Control/stop worrying 2 1 2   Worry too much - different things 1 1 2   Trouble relaxing 3 1 3   Restless 0 0 2  Easily annoyed or irritable 1 0 2  Afraid - awful might happen 0 0 2  Total GAD 7 Score 9 4 15    Assessment & Plan:  This visit occurred during the SARS-CoV-2 public health emergency.  Safety protocols were in place, including screening questions prior to the visit, additional usage of staff PPE, and extensive cleaning of exam room while observing appropriate contact time as indicated for disinfecting solutions.   Problem List Items Addressed This Visit    MDD (major depressive disorder), recurrent episode, moderate ([place]) - Primary    Chronic, longstanding since husband's death, deteriorated this winter ?SAD component, social isolation from pandemic contributing. Poor social support. Currently not benefiting from counseling - suggested finding new counselor, she is not interested at this time. If no improvement, consider psych eval.  She has had good effect with sertraline previously - will transition from celexa 20mg  to sertraline 100mg  daily. Will continue effexor XR 75mg  and remeron 15mg  nightly. She will also continue her klonopin.  PHQ9/GAD7 reviewed.  RTC 1 mo f/u visit.  Depression handout provided.        Relevant Medications   mirtazapine (REMERON) 15 MG tablet   sertraline (ZOLOFT) 100 MG tablet   Other Relevant Orders   Ambulatory referral to Psychology       Meds  ordered this encounter  Medications  . sertraline (ZOLOFT) 100 MG tablet    Sig: Take 1 tablet (100 mg total) by mouth daily.    Dispense:  90 tablet    Refill:  1    To replace celexa   Orders Placed This Encounter  Procedures  . Ambulatory referral to Psychology    Referral Priority:   Routine    Referral Type:   Psychiatric    Referral Reason:   Specialty Services Required    Requested Specialty:   Psychology    Number of Visits Requested:   1    Patient instructions: Let's stop celexa and start zoloft 100mg  daily - sent to pharmacy.  Continue remeron, continue venlafaxine.  Return in 1 month for mood follow up visit.   Follow up plan: Return in about 4 weeks (around 06/10/2019) for follow up visit.  [name], MD

## 2019-05-13 NOTE — Telephone Encounter (Signed)
Patient called stating that her depression is getting worse. She use to take Zoloft and that seemed to help her at that time. Medication she is taking now is not helping her as well. She just wants help. She lives at [facility] and they are on such a strict lock down. Patient denies any suicidal thoughts or ideations. She use to have a gun but that was taken away by the staff at [facility].  I scheduled patient for today but advised patient to let us know if her thoughts or feelings got worse or changed before her appointment.

## 2019-05-13 NOTE — Assessment & Plan Note (Addendum)
Chronic, longstanding since husband's death, deteriorated this winter ?SAD component, social isolation from pandemic contributing. Poor social support. Currently not benefiting from counseling - suggested finding new counselor, she is not interested at this time. If no improvement, consider psych eval.  She has had good effect with sertraline previously - will transition from celexa 20mg  to sertraline 100mg  daily. Will continue effexor XR 75mg  and remeron 15mg  nightly. She will also continue her klonopin.  PHQ9/GAD7 reviewed.  RTC 1 mo f/u visit.  Depression handout provided.

## 2019-05-13 NOTE — Patient Instructions (Addendum)
Let's stop celexa and start zoloft 100mg  daily - sent to pharmacy.  Continue remeron, continue venlafaxine.  Return in 1 month for mood follow up visit.   Major Depressive Disorder, Adult Major depressive disorder (MDD) is a mental health condition. It may also be called clinical depression or unipolar depression. MDD usually causes feelings of sadness, hopelessness, or helplessness. MDD can also cause physical symptoms. It can interfere with work, school, relationships, and other everyday activities. MDD may be mild, moderate, or severe. It may occur once (single episode major depressive disorder) or it may occur multiple times (recurrent major depressive disorder). What are the causes? The exact cause of this condition is not known. MDD is most likely caused by a combination of things, which may include:  Genetic factors. These are traits that are passed along from parent to child.  Individual factors. Your personality, your behavior, and the way you handle your thoughts and feelings may contribute to MDD. This includes personality traits and behaviors learned from others.  Physical factors, such as: ? Differences in the part of your brain that controls emotion. This part of your brain may be different than it is in people who do not have MDD. ? Long-term (chronic) medical or psychiatric illnesses.  Social factors. Traumatic experiences or major life changes may play a role in the development of MDD. What increases the risk? This condition is more likely to develop in women. The following factors may also make you more likely to develop MDD:  A family history of depression.  Troubled family relationships.  Abnormally low levels of certain brain chemicals.  Traumatic events in childhood, especially abuse or the loss of a parent.  Being under a lot of stress, or long-term stress, especially from upsetting life experiences or losses.  A history of: ? Chronic physical illness. ? Other  mental health disorders. ? Substance abuse.  Poor living conditions.  Experiencing social exclusion or discrimination on a regular basis. What are the signs or symptoms? The main symptoms of MDD typically include:  Constant depressed or irritable mood.  Loss of interest in things and activities. MDD symptoms may also include:  Sleeping or eating too much or too little.  Unexplained weight change.  Fatigue or low energy.  Feelings of worthlessness or guilt.  Difficulty thinking clearly or making decisions.  Thoughts of suicide or of harming others.  Physical agitation or weakness.  Isolation. Severe cases of MDD may also occur with other symptoms, such as:  Delusions or hallucinations, in which you imagine things that are not real (psychotic depression).  Low-level depression that lasts at least a year (chronic depression or persistent depressive disorder).  Extreme sadness and hopelessness (melancholic depression).  Trouble speaking and moving (catatonic depression). How is this diagnosed? This condition may be diagnosed based on:  Your symptoms.  Your medical history, including your mental health history. This may involve tests to evaluate your mental health. You may be asked questions about your lifestyle, including any drug and alcohol use, and how long you have had symptoms of MDD.  A physical exam.  Blood tests to rule out other conditions. You must have a depressed mood and at least four other MDD symptoms most of the day, nearly every day in the same 2-week timeframe before your health care provider can confirm a diagnosis of MDD. How is this treated? This condition is usually treated by mental health professionals, such as psychologists, psychiatrists, and clinical social workers. You may need more than one  type of treatment. Treatment may include:  Psychotherapy. This is also called talk therapy or counseling. Types of psychotherapy include: ? Cognitive  behavioral therapy (CBT). This type of therapy teaches you to recognize unhealthy feelings, thoughts, and behaviors, and replace them with positive thoughts and actions. ? Interpersonal therapy (IPT). This helps you to improve the way you relate to and communicate with others. ? Family therapy. This treatment includes members of your family.  Medicine to treat anxiety and depression, or to help you control certain emotions and behaviors.  Lifestyle changes, such as: ? Limiting alcohol and drug use. ? Exercising regularly. ? Getting plenty of sleep. ? Making healthy eating choices. ? Spending more time outdoors.  Treatments involving stimulation of the brain can be used in situations with extremely severe symptoms, or when medicine or other therapies do not work over time. These treatments include electroconvulsive therapy, transcranial magnetic stimulation, and vagal nerve stimulation. Follow these instructions at home: Activity  Return to your normal activities as told by your health care provider.  Exercise regularly and spend time outdoors as told by your health care provider. General instructions  Take over-the-counter and prescription medicines only as told by your health care provider.  Do not drink alcohol. If you drink alcohol, limit your alcohol intake to no more than 1 drink a day for nonpregnant women and 2 drinks a day for men. One drink equals 12 oz of beer, 5 oz of wine, or 1 oz of hard liquor. Alcohol can affect any antidepressant medicines you are taking. Talk to your health care provider about your alcohol use.  Eat a healthy diet and get plenty of sleep.  Find activities that you enjoy doing, and make time to do them.  Consider joining a support group. Your health care provider may be able to recommend a support group.  Keep all follow-up visits as told by your health care provider. This is important. Where to find more information [employer] on Mental  Illness  www.nami.org U.S. National Institute of Mental Health  https://carter.com/ National Suicide Prevention Lifeline  1-800-273-TALK 5107884593). This is free, 24-hour help. Contact a health care provider if:  Your symptoms get worse.  You develop new symptoms. Get help right away if:  You self-harm.  You have serious thoughts about hurting yourself or others.  You see, hear, taste, smell, or feel things that are not present (hallucinate). This information is not intended to replace advice given to you by your health care provider. Make sure you discuss any questions you have with your health care provider. Document Revised: 03/13/2017 Document Reviewed: 10/10/2015 Elsevier Patient Education  2020 [employer].

## 2019-05-13 NOTE — Telephone Encounter (Signed)
Will see then. 

## 2019-05-13 NOTE — Telephone Encounter (Signed)
See phone note. Patient has an appointment with Dr. Danise Mina today.

## 2019-05-17 DIAGNOSIS — H2513 Age-related nuclear cataract, bilateral: Secondary | ICD-10-CM | POA: Diagnosis not present

## 2019-05-17 LAB — HM DIABETES EYE EXAM

## 2019-05-18 ENCOUNTER — Encounter: Payer: Self-pay | Admitting: Family Medicine

## 2019-05-20 ENCOUNTER — Other Ambulatory Visit: Payer: Self-pay

## 2019-05-20 ENCOUNTER — Ambulatory Visit: Payer: Medicare HMO | Admitting: Orthotics

## 2019-05-20 DIAGNOSIS — M205X9 Other deformities of toe(s) (acquired), unspecified foot: Secondary | ICD-10-CM

## 2019-05-20 DIAGNOSIS — E118 Type 2 diabetes mellitus with unspecified complications: Secondary | ICD-10-CM | POA: Diagnosis not present

## 2019-05-20 DIAGNOSIS — M79674 Pain in right toe(s): Secondary | ICD-10-CM | POA: Diagnosis not present

## 2019-05-20 DIAGNOSIS — M205X1 Other deformities of toe(s) (acquired), right foot: Secondary | ICD-10-CM | POA: Diagnosis not present

## 2019-05-20 DIAGNOSIS — M21612 Bunion of left foot: Secondary | ICD-10-CM | POA: Diagnosis not present

## 2019-05-20 DIAGNOSIS — M21619 Bunion of unspecified foot: Secondary | ICD-10-CM

## 2019-05-20 DIAGNOSIS — M79675 Pain in left toe(s): Secondary | ICD-10-CM | POA: Diagnosis not present

## 2019-05-20 DIAGNOSIS — B351 Tinea unguium: Secondary | ICD-10-CM | POA: Diagnosis not present

## 2019-05-24 ENCOUNTER — Other Ambulatory Visit: Payer: Self-pay

## 2019-05-24 ENCOUNTER — Ambulatory Visit: Payer: Medicare HMO | Admitting: Podiatry

## 2019-05-24 DIAGNOSIS — M2010 Hallux valgus (acquired), unspecified foot: Secondary | ICD-10-CM | POA: Diagnosis not present

## 2019-05-24 DIAGNOSIS — M79674 Pain in right toe(s): Secondary | ICD-10-CM

## 2019-05-24 DIAGNOSIS — B351 Tinea unguium: Secondary | ICD-10-CM

## 2019-05-24 DIAGNOSIS — M79675 Pain in left toe(s): Secondary | ICD-10-CM

## 2019-05-24 DIAGNOSIS — E118 Type 2 diabetes mellitus with unspecified complications: Secondary | ICD-10-CM | POA: Diagnosis not present

## 2019-05-25 ENCOUNTER — Encounter: Payer: Self-pay | Admitting: Podiatry

## 2019-05-25 NOTE — Progress Notes (Signed)
  Subjective:  Patient ID: Kathleen Williamson, female    DOB: [birth year],  MRN: [id number]  Chief Complaint  Patient presents with  . Nail Problem    pt is here for a 3 month nail trim. Pt is a diabetic type 2, with no other coments or concerns   79 y.o. female returns for the above complaint.  Patient presents with painful toenails x10.  She states that it hurts when she is ambulating.  She states that she has been walking a lot especially since pandemic.  She denies any other acute complaints she states she ambulates in regular sneakers.  She denies having diabetic shoes.  She denies seeing any other podiatrist for this.  She was recently diagnosed about 3 years ago with diabetes with diet controlled.  Her last A1c was 7.2.  She denies any nausea fever chills vomiting.  Objective:   There were no vitals filed for this visit. Podiatric Exam: Vascular: dorsalis pedis and posterior tibial pulses are palpable bilateral. Capillary return is immediate. Temperature gradient is WNL. Skin turgor WNL  Sensorium: Normal Semmes Weinstein monofilament test. Normal tactile sensation bilaterally. Nail Exam: Pt has thick disfigured discolored nails with subungual debris noted bilateral entire nail hallux through fifth toenails Ulcer Exam: There is no evidence of ulcer or pre-ulcerative changes or infection. Orthopedic Exam: Muscle tone and strength are WNL. No limitations in general ROM. No crepitus or effusions noted. HAV  B/L.  Hammer toes 2-5  B/L. Skin: No Porokeratosis. No infection or ulcers  Assessment & Plan:  Patient was evaluated and treated and all questions answered.  Onychomycosis with pain  -Nails palliatively debrided as below. -Educated on self-care  Bilateral hallux valgus deformity -I discussed with the patient the etiology of bunion deformity and various treatment options associated with it.  Currently patient does not have pain and therefore she can continue offloading even with wider  shoe gear modification.  Patient states understanding -I believe patient will benefit from diabetic insoles given that she has a bunion deformity bilaterally with hammertoe contracture.  I will have her see Liliane Channel to have the diabetic insoles made.  Procedure: Nail Debridement Rationale: pain  Type of Debridement: manual, sharp debridement. Instrumentation: Nail nipper, rotary burr. Number of Nails: 10  Procedures and Treatment: Consent by patient was obtained for treatment procedures. The patient understood the discussion of treatment and procedures well. All questions were answered thoroughly reviewed. Debridement of mycotic and hypertrophic toenails, 1 through 5 bilateral and clearing of subungual debris. No ulceration, no infection noted.  Return Visit-Office Procedure: Patient instructed to return to the office for a follow up visit 3 months for continued evaluation and treatment.  Kathleen Williamson, DPM    No follow-ups on file.

## 2019-05-27 ENCOUNTER — Other Ambulatory Visit: Payer: Self-pay | Admitting: Family Medicine

## 2019-05-30 ENCOUNTER — Telehealth: Payer: Self-pay | Admitting: Lab

## 2019-05-30 ENCOUNTER — Telehealth: Payer: Self-pay

## 2019-05-30 ENCOUNTER — Ambulatory Visit (INDEPENDENT_AMBULATORY_CARE_PROVIDER_SITE_OTHER): Payer: Medicare HMO | Admitting: Psychology

## 2019-05-30 DIAGNOSIS — F4323 Adjustment disorder with mixed anxiety and depressed mood: Secondary | ICD-10-CM | POA: Diagnosis not present

## 2019-05-30 NOTE — Telephone Encounter (Signed)
Called Pt back No answer left a VM to call office .

## 2019-05-30 NOTE — Telephone Encounter (Signed)
Pt called back and would like a copy of her Flu immunization mailed out to her home, which I did today 05/30/2019.

## 2019-05-30 NOTE — Telephone Encounter (Signed)
Called Pt back, Pt stated she needs a copy of Flu shot faxed, because she will be doing some volunteer work.

## 2019-05-30 NOTE — Telephone Encounter (Signed)
Pt left v/m requesting documentation when pt received flu shot this year. Pt request cb.

## 2019-06-14 ENCOUNTER — Other Ambulatory Visit: Payer: Self-pay

## 2019-06-14 ENCOUNTER — Ambulatory Visit (INDEPENDENT_AMBULATORY_CARE_PROVIDER_SITE_OTHER): Payer: Medicare HMO | Admitting: Family Medicine

## 2019-06-14 ENCOUNTER — Encounter: Payer: Self-pay | Admitting: Family Medicine

## 2019-06-14 DIAGNOSIS — F331 Major depressive disorder, recurrent, moderate: Secondary | ICD-10-CM

## 2019-06-14 MED ORDER — MIRTAZAPINE 30 MG PO TABS: 30.0000 mg | ORAL_TABLET | Freq: Every day | ORAL | 1 refills | Status: DC

## 2019-06-14 NOTE — Patient Instructions (Addendum)
I'm glad you're doing better! For oversedation - let's increase remeron to 30 mg nightly.  Continue zoloft and effexor at current doses.  Careful with missed effexor doses.  Return in 3 months for follow up visit.

## 2019-06-14 NOTE — Progress Notes (Signed)
This visit was conducted in person.  BP 122/62 (BP Location: Right Arm, Patient Position: Sitting, Cuff Size: Normal)   Pulse 84   Temp 97.6 F (36.4 C) (Temporal)   Ht 5' 2.5" (1.588 m)   Wt 147 lb 4 oz (66.8 kg)   LMP  (LMP Unknown)   SpO2 98%   BMI 26.50 kg/m    CC: 1 mo f/u visit Subjective:    Patient ID: Kathleen Williamson, female    DOB: 04-09-1941, 79 y.o.   MRN: [id number]  HPI: Kathleen Williamson is a 79 y.o. female presenting on 06/14/2019 for Depression (Here for 1 mo f/u.)   See prior note for details Longstanding struggle with depression. ?SAD component + pandemic isolation contributing.  Last visit we transitioned from celexa to sertraline 100mg  and continud effexor XR 75mg  daily and remeron 15mg  nightly.  Feels she may be sleeping too much.   Forgot Sunday's dose of effexor and zoloft - felt pretty bad yesterday morning.  She has started walking daily weather permitting.  She continues meeting with brother in law and sister in Sports coach.      Relevant past medical, surgical, family and social history reviewed and updated as indicated. Interim medical history since our last visit reviewed. Allergies and medications reviewed and updated. Outpatient Medications Prior to Visit  Medication Sig Dispense Refill  . aspirin EC 81 MG tablet Take 81 mg by mouth at bedtime.    . Biotin 5 MG CAPS Take 1 capsule by mouth daily.    . Calcium Carbonate-Vitamin D 600-400 MG-UNIT tablet Take 1 tablet by mouth 2 (two) times daily.    . clonazePAM (KLONOPIN) 0.5 MG tablet TAKE 1 TABLET BY MOUTH TWICE A DAY AS NEEDED 60 tablet 0  . Coenzyme Q10 (COQ10) 100 MG CAPS Take 1 capsule by mouth at bedtime.     Marland Kitchen ezetimibe (ZETIA) 10 MG tablet TAKE 1 TABLET BY MOUTH ONCE A DAY 90 tablet 3  . Glucosamine-Chondroitin (MOVE FREE PO) Take 2 tablets by mouth daily.    Marland Kitchen levothyroxine (SYNTHROID) 50 MCG tablet TAKE 1 TABLET BY MOUTH EVERY DAY 90 tablet 2  . loperamide (IMODIUM A-D) 2 MG tablet Take 2 mg  by mouth every morning.    . nitroGLYCERIN (NITROLINGUAL) 0.4 MG/SPRAY spray Place 1 spray under the tongue every 5 (five) minutes as needed. 12 g prn  . ONETOUCH DELICA LANCETS [id number] MISC 1 each by Does not apply route as directed. Use as instructed to check sugar once a day.  Dx code:  E11.21 100 each 3  . ONETOUCH VERIO test strip USE AS INSTRUCTED TO CHECK SUGAR ONCE A DAY. DX CODE: E11.21 100 each 3  . pravastatin (PRAVACHOL) 80 MG tablet TAKE 1 TABLET BY MOUTH EVERY EVENING 90 tablet 3  . sertraline (ZOLOFT) 100 MG tablet Take 1 tablet (100 mg total) by mouth daily. 90 tablet 1  . venlafaxine XR (EFFEXOR-XR) 75 MG 24 hr capsule TAKE 1 CAPSULE (75 MG TOTAL) BY MOUTH DAILY WITH BREAKFAST. 90 capsule 3  . mirtazapine (REMERON) 15 MG tablet TAKE 1 TABLET BY MOUTH EVERYDAY AT BEDTIME 90 tablet 0   No facility-administered medications prior to visit.     Per HPI unless specifically indicated in ROS section below Review of Systems Objective:    BP 122/62 (BP Location: Right Arm, Patient Position: Sitting, Cuff Size: Normal)   Pulse 84   Temp 97.6 F (36.4 C) (Temporal)   Ht 5' 2.5" (1.588  m)   Wt 147 lb 4 oz (66.8 kg)   LMP  (LMP Unknown)   SpO2 98%   BMI 26.50 kg/m   Wt Readings from Last 3 Encounters:  06/14/19 147 lb 4 oz (66.8 kg)  05/13/19 147 lb 3 oz (66.8 kg)  02/22/19 149 lb 4 oz (67.7 kg)    Physical Exam Vitals and nursing note reviewed.  Constitutional:      Appearance: Normal appearance.  Neurological:     Mental Status: She is alert.  Psychiatric:        Mood and Affect: Mood normal.        Behavior: Behavior normal.     Comments: Notably brighter affect today        Results for orders placed or performed in visit on 05/18/19  HM DIABETES EYE EXAM  Result Value Ref Range   HM Diabetic Eye Exam No Retinopathy No Retinopathy   Depression screen [facility] 2/9 06/14/2019 05/13/2019 02/22/2019 02/16/2018 09/15/2017  Decreased Interest 1 3 3 2 1   Down, Depressed, Hopeless 1  3 3 1 2   PHQ - 2 Score 2 6 6 3 3   Altered sleeping 2 1 0 0 0  Tired, decreased energy 1 3 1 1 2   Change in appetite 1 2 1 1 2   Feeling bad or failure about yourself  0 1 1 1 1   Trouble concentrating 0 1 0 0 1  Moving slowly or fidgety/restless 0 0 0 0 1  Suicidal thoughts 0 0 0 0 0  PHQ-9 Score 6 14 9 6 10   Difficult doing work/chores - - - Not difficult at all -  Some recent data might be hidden    GAD 7 : Generalized Anxiety Score 06/14/2019 05/13/2019 02/22/2019 09/15/2017  Nervous, Anxious, on Edge 0 2 1 2   Control/stop worrying 0 2 1 2   Worry too much - different things 0 1 1 2   Trouble relaxing 1 3 1 3   Restless 0 0 0 2  Easily annoyed or irritable 0 1 0 2  Afraid - awful might happen 0 0 0 2  Total GAD 7 Score 1 9 4 15    Assessment & Plan:  This visit occurred during the SARS-CoV-2 public health emergency.  Safety protocols were in place, including screening questions prior to the visit, additional usage of staff PPE, and extensive cleaning of exam room while observing appropriate contact time as indicated for disinfecting solutions.   Problem List Items Addressed This Visit    MDD (major depressive disorder), recurrent episode, moderate ([place])    Marked improvement after latest med changes and with improving weather! Encouraged continued walking outdoors and social engagement.  Due to oversedation, will increase remeron dose to 30mg  nightly.  RTC 3 mo f/u visit. Pt agrees with plan.       Relevant Medications   mirtazapine (REMERON) 30 MG tablet       Meds ordered this encounter  Medications  . mirtazapine (REMERON) 30 MG tablet    Sig: Take 1 tablet (30 mg total) by mouth at bedtime.    Dispense:  90 tablet    Refill:  1   No orders of the defined types were placed in this encounter.   Patient Instructions  I'm glad you're doing better! For oversedation - let's increase remeron to 30 mg nightly.  Continue zoloft and effexor at current doses.  Careful with missed  effexor doses.  Return in 3 months for follow up visit.  Follow up plan: Return in about 3 months (around 09/14/2019) for follow up visit.  [name], MD

## 2019-06-14 NOTE — Assessment & Plan Note (Signed)
Marked improvement after latest med changes and with improving weather! Encouraged continued walking outdoors and social engagement.  Due to oversedation, will increase remeron dose to 30mg  nightly.  RTC 3 mo f/u visit. Pt agrees with plan.

## 2019-06-15 ENCOUNTER — Other Ambulatory Visit: Payer: Self-pay | Admitting: Family Medicine

## 2019-06-15 DIAGNOSIS — F331 Major depressive disorder, recurrent, moderate: Secondary | ICD-10-CM

## 2019-06-15 NOTE — Telephone Encounter (Signed)
Name of Medication: Clonazepam Name of Pharmacy: [facility] or Written Date and Quantity: 05/13/19, #60 Last Office Visit and Type: 06/14/19, MDD f/u Next Office Visit and Type: 09/16/19, 3 mo MDD f/u Last Controlled Substance Agreement Date: 12/19/16 Last UDS: 12/19/16

## 2019-06-17 NOTE — Telephone Encounter (Signed)
ERx 

## 2019-06-24 DIAGNOSIS — M810 Age-related osteoporosis without current pathological fracture: Secondary | ICD-10-CM | POA: Diagnosis not present

## 2019-06-24 DIAGNOSIS — Z1231 Encounter for screening mammogram for malignant neoplasm of breast: Secondary | ICD-10-CM | POA: Diagnosis not present

## 2019-06-24 DIAGNOSIS — M8589 Other specified disorders of bone density and structure, multiple sites: Secondary | ICD-10-CM | POA: Diagnosis not present

## 2019-06-24 LAB — HM MAMMOGRAPHY

## 2019-06-28 ENCOUNTER — Ambulatory Visit (INDEPENDENT_AMBULATORY_CARE_PROVIDER_SITE_OTHER): Payer: Medicare HMO | Admitting: Psychology

## 2019-06-28 DIAGNOSIS — F4323 Adjustment disorder with mixed anxiety and depressed mood: Secondary | ICD-10-CM

## 2019-06-30 ENCOUNTER — Encounter: Payer: Self-pay | Admitting: Family Medicine

## 2019-07-13 ENCOUNTER — Other Ambulatory Visit: Payer: Self-pay | Admitting: Family Medicine

## 2019-07-13 DIAGNOSIS — F331 Major depressive disorder, recurrent, moderate: Secondary | ICD-10-CM

## 2019-07-13 NOTE — Telephone Encounter (Signed)
Name of Medication: Clonazepam Name of Pharmacy: [facility] or Written Date and Quantity: 06/17/19, #60 Last Office Visit and Type: 06/14/19, MDD f/u Next Office Visit and Type: 09/16/19, MDD f/u Last Controlled Substance Agreement Date: 12/19/16 Last UDS: 12/29/16

## 2019-07-14 ENCOUNTER — Other Ambulatory Visit: Payer: Self-pay | Admitting: Family Medicine

## 2019-07-14 DIAGNOSIS — E118 Type 2 diabetes mellitus with unspecified complications: Secondary | ICD-10-CM

## 2019-07-14 MED ORDER — ACCU-CHEK FASTCLIX LANCETS MISC: 0 refills | Status: DC

## 2019-07-14 MED ORDER — ACCU-CHEK GUIDE W/DEVICE KIT: 1.0000 | PACK | 0 refills | Status: AC

## 2019-07-14 MED ORDER — ACCU-CHEK GUIDE VI STRP: ORAL_STRIP | 0 refills | Status: DC

## 2019-07-14 NOTE — Telephone Encounter (Signed)
Spoke with [facility] and told they did receive all rxs and will fill for pt.

## 2019-07-14 NOTE — Telephone Encounter (Signed)
ERx 

## 2019-07-14 NOTE — Telephone Encounter (Signed)
Please phone in due to E prescribing error.  

## 2019-07-14 NOTE — Telephone Encounter (Signed)
Per pharmacy, pt's ins co no longer covers OneTouch Verio.  Suggests new rx for Accu-Chek Guide, test strips and Accu-Chek Fastclix lancets.  E-scribed requested supplies above.

## 2019-07-26 ENCOUNTER — Ambulatory Visit (INDEPENDENT_AMBULATORY_CARE_PROVIDER_SITE_OTHER): Payer: Medicare HMO | Admitting: Psychology

## 2019-07-26 ENCOUNTER — Encounter: Payer: Self-pay | Admitting: Family Medicine

## 2019-07-26 ENCOUNTER — Ambulatory Visit (INDEPENDENT_AMBULATORY_CARE_PROVIDER_SITE_OTHER): Payer: Medicare HMO | Admitting: Family Medicine

## 2019-07-26 ENCOUNTER — Other Ambulatory Visit: Payer: Self-pay

## 2019-07-26 VITALS — Ht 62.5 in | Wt 147.0 lb

## 2019-07-26 DIAGNOSIS — R197 Diarrhea, unspecified: Secondary | ICD-10-CM | POA: Diagnosis not present

## 2019-07-26 DIAGNOSIS — F4323 Adjustment disorder with mixed anxiety and depressed mood: Secondary | ICD-10-CM | POA: Diagnosis not present

## 2019-07-26 NOTE — Assessment & Plan Note (Signed)
Present for 3 weeks. No recent abx use, but will start by ruling out infection with GI pathogen panel. She will pick this up today. Does not sound like malabsorption issue.  In h/o microscopic colitis found on colonoscopy 2016, anticipate recurrent flare of this. Discussed sertraline as possible contributor - if GI pathogen panel normal, will taper off sertraline onto another SSRI.  Ok to continue pepto bismol, continue efforts towards good hydration status.

## 2019-07-26 NOTE — Progress Notes (Signed)
   Kathleen Williamson - 79 y.o. female  MRN [id number]  Date of Birth: Jun 22, 1940  PCP: [name], MD  This service was provided via telemedicine. Phone Visit performed on 07/26/2019    Rationale for phone visit along with limitations reviewed. Patient consented to telephone encounter.    Location of patient: home Location of provider: in office, [place] [facility] Name of referring provider: N/A   Names of persons and role in encounter: Provider: [name], MD  Patient: Kathleen Williamson  Other: N/A   Time on call: 8:19am - 8:33am   Subjective: Chief Complaint  Patient presents with  . Diarrhea    C/o diarrhea.  Started about 3 wks ago.  Tried Imodium, BRAT diet and Pepto Bismol.  Pepto seems to help.     HPI:  3 wk h/o watery diarrhea worse with meals. Sometimes has lower abdominal cramping. Some stool urgency associated with accidents.   Treating with imodium, BRAT diet with minimal benefit.  Pepto bismol has seemed to help the most but turned stools dark.   No fevers/chills, nausea/vomiting, UTI symptoms.  Stool is not more malodorous than usual.  No noted weight loss.  No recent antibiotic use.  No pale or yellow colored stool.  No change in buoyancy.  No night time symptoms.   She is on zoloft 100mg  and effexor 75mg  daily as well as remeron 30mg  daily. She's been taking pedialyte.   COLONOSCOPY WITH PROPOFOL 05/23/2014 - microscopic colitis - Garlan Fair, MD  ESOPHAGOGASTRODUODENOSCOPY (EGD) WITH PROPOFOL 05/23/2014 - WNL Garlan Fair, MD    Objective/Observations:  No physical exam or vital signs collected unless specifically identified below.   Ht 5' 2.5" (1.588 m)   Wt 147 lb (66.7 kg)   LMP  (LMP Unknown)   BMI 26.46 kg/m   Wt Readings from Last 3 Encounters:  07/26/19 147 lb (66.7 kg)  06/14/19 147 lb 4 oz (66.8 kg)  05/13/19 147 lb 3 oz (66.8 kg)   Respiratory status: speaks in complete sentences without evident shortness  of breath.   Assessment/Plan:  Diarrhea Present for 3 weeks. No recent abx use, but will start by ruling out infection with GI pathogen panel. She will pick this up today. Does not sound like malabsorption issue.  In h/o microscopic colitis found on colonoscopy 2016, anticipate recurrent flare of this. Discussed sertraline as possible contributor - if GI pathogen panel normal, will taper off sertraline onto another SSRI.  Ok to continue pepto bismol, continue efforts towards good hydration status.    I discussed the assessment and treatment plan with the patient. The patient was provided an opportunity to ask questions and all were answered. The patient agreed with the plan and demonstrated an understanding of the instructions.   Lab Orders     Gastrointestinal Pathogen Panel PCR  No orders of the defined types were placed in this encounter.   The patient was advised to call back or seek an in-person evaluation if the symptoms worsen or if the condition fails to improve as anticipated.  [name], MD

## 2019-07-27 ENCOUNTER — Other Ambulatory Visit (INDEPENDENT_AMBULATORY_CARE_PROVIDER_SITE_OTHER): Payer: Medicare HMO

## 2019-07-27 DIAGNOSIS — R197 Diarrhea, unspecified: Secondary | ICD-10-CM | POA: Diagnosis not present

## 2019-07-28 LAB — GASTROINTESTINAL PATHOGEN PANEL PCR
C. difficile Tox A/B, PCR: NOT DETECTED
Campylobacter, PCR: NOT DETECTED
Cryptosporidium, PCR: NOT DETECTED
E coli (ETEC) LT/ST PCR: NOT DETECTED
E coli (STEC) stx1/stx2, PCR: NOT DETECTED
E coli 0157, PCR: NOT DETECTED
Giardia lamblia, PCR: NOT DETECTED
Norovirus, PCR: NOT DETECTED
Rotavirus A, PCR: NOT DETECTED
Salmonella, PCR: NOT DETECTED
Shigella, PCR: NOT DETECTED

## 2019-08-10 ENCOUNTER — Telehealth: Payer: Self-pay | Admitting: Family Medicine

## 2019-08-10 DIAGNOSIS — M810 Age-related osteoporosis without current pathological fracture: Secondary | ICD-10-CM

## 2019-08-10 NOTE — Telephone Encounter (Signed)
plz call - received bone density scan from [facility] last month - overall stable osteoporosis - rec continue prolia injections.    DEXA 06/2019: T score -2.7 L hip, -2.1 spine

## 2019-08-10 NOTE — Telephone Encounter (Signed)
Lvm asking pt to call back.  Need to relay Dr. G's message.  

## 2019-08-10 NOTE — Telephone Encounter (Signed)
Patient is returning your call.  

## 2019-08-10 NOTE — Telephone Encounter (Signed)
Spoke with pt relaying results/message per Dr. Darnell Level.  Pt verbalizes understanding.

## 2019-08-15 ENCOUNTER — Other Ambulatory Visit: Payer: Self-pay | Admitting: Family Medicine

## 2019-08-15 ENCOUNTER — Other Ambulatory Visit: Payer: Self-pay | Admitting: Nurse Practitioner

## 2019-08-15 DIAGNOSIS — F331 Major depressive disorder, recurrent, moderate: Secondary | ICD-10-CM

## 2019-08-15 NOTE — Telephone Encounter (Signed)
Last filled 07-14-19 #60 Last OV 07-26-19 Next OV 09-16-19 [place]

## 2019-08-16 ENCOUNTER — Other Ambulatory Visit: Payer: Self-pay

## 2019-08-16 ENCOUNTER — Ambulatory Visit: Payer: Medicare HMO | Admitting: Podiatry

## 2019-08-16 ENCOUNTER — Telehealth: Payer: Self-pay | Admitting: *Deleted

## 2019-08-16 DIAGNOSIS — B351 Tinea unguium: Secondary | ICD-10-CM

## 2019-08-16 DIAGNOSIS — M79674 Pain in right toe(s): Secondary | ICD-10-CM | POA: Diagnosis not present

## 2019-08-16 DIAGNOSIS — M79675 Pain in left toe(s): Secondary | ICD-10-CM

## 2019-08-16 DIAGNOSIS — L853 Xerosis cutis: Secondary | ICD-10-CM | POA: Diagnosis not present

## 2019-08-16 DIAGNOSIS — E118 Type 2 diabetes mellitus with unspecified complications: Secondary | ICD-10-CM | POA: Diagnosis not present

## 2019-08-16 NOTE — Telephone Encounter (Signed)
Sertraline and venlafaxine do work similarly but are different families of antidepressants. She has done well on this dose venlafaxine up to now.  However, given sertraline not really effective anymore, reasonable to take her off this, but I would want her to continue her venlafaxine - as suddenly stopping can cause bad discontinuation symptoms.  If pt agrees, plz call pharmacy and ask them to fill venlafaxine and we will stop sertraline.

## 2019-08-16 NOTE — Telephone Encounter (Signed)
Patient called stating that she needs some clarification on medications. Patient stated that she went yesterday to pick up a refill on Venlafaxine and the pharmacist did not want to fill it. Patient stated that she is taking Sertraline 100 mg 1/2 pill daily. Patient stated that she was taking a whole pill of Sertraline and was advised to cut it in half because it was causing her to have diarrhea. Patient stated the pharmacist told her that Sertraline and Venlafaxine is the same type of medication and now sure she should be on both. Patient stated that she does not feel that the Sertraline 100 mg 1/2 is working well for her. Patient stated that pharmacist did not fill the Venlafaxine. Patient stated that she needs this cleared up for her.

## 2019-08-16 NOTE — Telephone Encounter (Addendum)
Spoke with pt relaying Dr. Synthia Innocent message.  Pt verbalizes understanding and will stop sertraline.  Informed her I will call the pharmacy and have them fill venlafaxine.  Pt verbalizes understanding and agrees.  Spoke with Jose at [facility] asking him to fill the venlafaxine XR, per provider request.  Says he will get it ready for the pt.  FYI to Dr. Darnell Level.

## 2019-08-17 ENCOUNTER — Encounter: Payer: Self-pay | Admitting: Podiatry

## 2019-08-17 NOTE — Telephone Encounter (Signed)
ERx 

## 2019-08-17 NOTE — Progress Notes (Signed)
  Subjective:  Patient ID: Kathleen Williamson, female    DOB: 08-Oct-1940,  MRN: [id number]  Chief Complaint  Patient presents with  . Foot Pain    pt is here for routine foot care, pt is also a diabetic type 2. Pt has no other comments or concerns   79 y.o. female returns for the above complaint.  Patient presents with painful toenails x10.  She states that it hurts when she is ambulating.  She states that she has been walking a lot especially since pandemic.  She denies any other acute complaints she states she ambulates in regular sneakers.  She denies having diabetic shoes.  She also has secondary complaint of dry skin to the bilateral lower extremity.  She has tried over the counter application and has been managing well..  She just wanted to make sure that there is no other treatment options.  Objective:   There were no vitals filed for this visit. Podiatric Exam: Vascular: dorsalis pedis and posterior tibial pulses are palpable bilateral. Capillary return is immediate. Temperature gradient is WNL. Skin turgor WNL  Sensorium: Normal Semmes Weinstein monofilament test. Normal tactile sensation bilaterally. Nail Exam: Pt has thick disfigured discolored nails with subungual debris noted bilateral entire nail hallux through fifth toenails Ulcer Exam: There is no evidence of ulcer or pre-ulcerative changes or infection. Orthopedic Exam: Muscle tone and strength are WNL. No limitations in general ROM. No crepitus or effusions noted. HAV  B/L.  Hammer toes 2-5  B/L. Skin: No Porokeratosis. No infection or ulcers  Assessment & Plan:  Patient was evaluated and treated and all questions answered.  Onychomycosis with pain  -Nails palliatively debrided as below. -Educated on self-care  Bilateral xerosis to the lower extremity -I explained to the patient the etiology of xerosis and various treatment options were extensively discussed.  I explained to the patient the importance of maintaining  moisturization of the skin with application of over-the-counter lotion such as Eucerin or Luciderm.  I have asked the patient to apply this twice a day.  If unable to resolve patient will benefit from prescription lotion.  Bilateral hallux valgus deformity -I discussed with the patient the etiology of bunion deformity and various treatment options associated with it.  Currently patient does not have pain and therefore she can continue offloading even with wider shoe gear modification.  Patient states understanding -I believe patient will benefit from diabetic insoles given that she has a bunion deformity bilaterally with hammertoe contracture.  I will have her see Liliane Channel to have the diabetic insoles made.  Procedure: Nail Debridement Rationale: pain  Type of Debridement: manual, sharp debridement. Instrumentation: Nail nipper, rotary burr. Number of Nails: 10  Procedures and Treatment: Consent by patient was obtained for treatment procedures. The patient understood the discussion of treatment and procedures well. All questions were answered thoroughly reviewed. Debridement of mycotic and hypertrophic toenails, 1 through 5 bilateral and clearing of subungual debris. No ulceration, no infection noted.  Return Visit-Office Procedure: Patient instructed to return to the office for a follow up visit 3 months for continued evaluation and treatment.  Boneta Lucks, DPM    No follow-ups on file.

## 2019-08-18 ENCOUNTER — Telehealth: Payer: Self-pay

## 2019-08-18 NOTE — Telephone Encounter (Signed)
Pt has [employer].  [id number] expires 04/13/20.  Pt would owe approx. $0 for Prolia. No admin fee.

## 2019-08-23 NOTE — Telephone Encounter (Signed)
Pt last prolia 04/04/20. Due after 10/04/19

## 2019-08-24 ENCOUNTER — Ambulatory Visit: Payer: Medicare HMO | Admitting: Cardiology

## 2019-08-24 ENCOUNTER — Encounter: Payer: Self-pay | Admitting: Cardiology

## 2019-08-24 ENCOUNTER — Other Ambulatory Visit: Payer: Self-pay

## 2019-08-24 VITALS — BP 120/60 | HR 80 | Ht 62.5 in | Wt 148.0 lb

## 2019-08-24 DIAGNOSIS — E782 Mixed hyperlipidemia: Secondary | ICD-10-CM

## 2019-08-24 DIAGNOSIS — N1832 Chronic kidney disease, stage 3b: Secondary | ICD-10-CM | POA: Diagnosis not present

## 2019-08-24 DIAGNOSIS — I2510 Atherosclerotic heart disease of native coronary artery without angina pectoris: Secondary | ICD-10-CM | POA: Diagnosis not present

## 2019-08-24 DIAGNOSIS — I6523 Occlusion and stenosis of bilateral carotid arteries: Secondary | ICD-10-CM

## 2019-08-24 NOTE — Patient Instructions (Signed)
Medication Instructions:  Your physician recommends that you continue on your current medications as directed. Please refer to the Current Medication list given to you today.  *If you need a refill on your cardiac medications before your next appointment, please call your pharmacy*   Lab Work: If you have labs (blood work) drawn today and your tests are completely normal, you will receive your results only by: Marland Kitchen MyChart Message (if you have MyChart) OR . A paper copy in the mail If you have any lab test that is abnormal or we need to change your treatment, we will call you to review the results.  Follow-Up: At [facility], you and your health needs are our priority.  As part of our continuing mission to provide you with exceptional heart care, we have created designated Provider Care Teams.  These Care Teams include your primary Cardiologist (physician) and Advanced Practice Providers (APPs -  Physician Assistants and Nurse Practitioners) who all work together to provide you with the care you need, when you need it.  We recommend signing up for the patient portal called "MyChart".  Sign up information is provided on this After Visit Summary.  MyChart is used to connect with patients for Virtual Visits (Telemedicine).  Patients are able to view lab/test results, encounter notes, upcoming appointments, etc.  Non-urgent messages can be sent to your provider as well.   To learn more about what you can do with MyChart, go to [email or web address].    Your next appointment:   1 year(s)  The format for your next appointment:   In Person  Provider:   You may see Candee Furbish, MD or one of the following Advanced Practice Providers on your designated Care Team:    Truitt Merle, NP  Cecilie Kicks, NP  Kathyrn Drown, NP

## 2019-08-24 NOTE — Progress Notes (Addendum)
Cardiology Office Note:    Date:  08/24/2019   ID:  Kathleen Williamson, DOB [birth year], MRN [id number]  PCP:  [name], MD  Cardiologist:  Candee Furbish, MD  Electrophysiologist:  None   Referring MD: [name], MD     History of Present Illness:    Kathleen Williamson is a 79 y.o. female here for the follow-up of coronary artery disease post CABG.  2003 (CABG-LIMA-LAD, free right IMA to PDA,SVG-diagonal, SVG-OM 2-OM 5 03/2002), carotid artery disease, dermatomyositis, depression, hyperlipidemia, hypothyroidism here for followup. Former patient of Dr. Leonia Reeves.    her symptoms prior to bypass. She was singing in the church choir, felt an elephant sitting on her chest, felt poorly, pulse was erratic. She did not to the hospital at that time. The next day she saw an internist who sent her to Dr. Leonia Reeves.   Her husband died in 10/18/2013 had grieving. Lives at [facility] down on [address].  Enjoys it there. Has a cat named Saki.   Has a Baker's cyst on left knee. This has limited her walking. She done Weight Watchers.  She is struggled in the past with her grief. She is without any family. No children.  Past Medical History:  Diagnosis Date  . Arthritis    fingers  . CAD (coronary artery disease) 2003   MI s/p 5v CABG  . Carotid stenosis    Michie Molnar  . CKD (chronic kidney disease) stage 3, GFR 30-59 ml/min   . Dermatomyositis [facility])    saw Dr Estanislado Pandy, improved on its own  . Diabetes mellitus without complication ([place])   . Forehead laceration 09/15/2017  . History of chicken pox   . History of measles   . History of migraine none since 2003  . HLD (hyperlipidemia)   . Hypothyroidism   . MDD (major depressive disorder), recurrent episode, moderate ([place])    h/o SI after lost husband unexpectedly 2015  . Microscopic colitis 05/2014   by colonoscopy, lymphocytic, zoloft related  . Osteoporosis    DEXA 03/2013 T -3.1, DEXA 10//2016 -2.9 hip, -2.2 spine  . Pancreatic cyst     Dr Ralene Ok, no f/u needed  . Prediabetes 2014  . Subclavian steal syndrome   . Urine incontinence     Past Surgical History:  Procedure Laterality Date  . APPENDECTOMY  1997   with hysterectomy  . [place]   lower  . BREAST LUMPECTOMY Left 1977   benign  . COLONOSCOPY WITH PROPOFOL N/A 05/23/2014   microscopic colitis - Garlan Fair, MD  . CORONARY ARTERY BYPASS GRAFT  2003   x 5 v  . ESOPHAGOGASTRODUODENOSCOPY (EGD) WITH PROPOFOL N/A 05/23/2014   WNL Garlan Fair, MD  . [place] Right 1990's  . TOOTH EXTRACTION  yrs ago  . TOTAL ABDOMINAL HYSTERECTOMY W/ BILATERAL SALPINGOOPHORECTOMY  1997   endometriosis    Current Medications: Current Meds  Medication Sig  . Accu-Chek FastClix Lancets MISC Use as instructed to check blood sugar once daily  . aspirin EC 81 MG tablet Take 81 mg by mouth at bedtime.  . Biotin 5 MG CAPS Take 1 capsule by mouth daily.  . Blood Glucose Monitoring Suppl (ACCU-CHEK GUIDE) w/Device KIT 1 each by Does not apply route as directed. Use as instructed to check blood sugar once daily  . Calcium Carbonate-Vitamin D 600-400 MG-UNIT tablet Take 1 tablet by mouth 2 (two) times daily.  . clonazePAM (KLONOPIN) 0.5  MG tablet TAKE 1 TABLET BY MOUTH TWICE A DAY AS NEEDED  . Coenzyme Q10 (COQ10) 100 MG CAPS Take 1 capsule by mouth at bedtime.   Marland Kitchen ezetimibe (ZETIA) 10 MG tablet TAKE 1 TABLET BY MOUTH ONCE A DAY  . Glucosamine-Chondroitin (MOVE FREE PO) Take 2 tablets by mouth daily.  Marland Kitchen glucose blood (ACCU-CHEK GUIDE) test strip Use as instructed to check blood sugar once daily  . levothyroxine (SYNTHROID) 50 MCG tablet TAKE 1 TABLET BY MOUTH EVERY DAY  . loperamide (IMODIUM A-D) 2 MG tablet Take 2 mg by mouth every morning.  . mirtazapine (REMERON) 30 MG tablet Take 1 tablet (30 mg total) by mouth at bedtime.  . nitroGLYCERIN (NITROLINGUAL) 0.4 MG/SPRAY spray Place 1 spray under the tongue every 5 (five) minutes as needed.  .  pravastatin (PRAVACHOL) 80 MG tablet TAKE 1 TABLET BY MOUTH EVERY DAY IN THE EVENING  . sertraline (ZOLOFT) 100 MG tablet Take 1 tablet (100 mg total) by mouth daily.  Marland Kitchen venlafaxine XR (EFFEXOR-XR) 75 MG 24 hr capsule TAKE 1 CAPSULE (75 MG TOTAL) BY MOUTH DAILY WITH BREAKFAST.     Allergies:   Sulfa antibiotics and Crestor [rosuvastatin calcium]   Social History   Socioeconomic History  . Marital status: Widowed    Spouse name: Not on file  . Number of children: Not on file  . Years of education: Not on file  . Highest education level: Not on file  Occupational History  . Not on file  Tobacco Use  . Smoking status: Never Smoker  . Smokeless tobacco: Never Used  Substance and Sexual Activity  . Alcohol use: No  . Drug use: No  . Sexual activity: Not Currently  Other Topics Concern  . Not on file  Social History Narrative   Lives alone at [facility].   Husband of 63 yrs died suddenly 09-21-2013.   No children, no siblings.    Close friends Bethena Roys and Ace Gins nearby, cousin in [place].   Activity: going to [employer]   Financial Resource Strain:   . Difficulty of Paying Living Expenses:   Food Insecurity:   . Worried About Charity fundraiser in the Last Year:   . Arboriculturist in the Last Year:   Transportation Needs:   . Film/video editor (Medical):   Marland Kitchen Lack of Transportation (Non-Medical):   Physical Activity:   . Days of Exercise per Week:   . Minutes of Exercise per Session:   Stress:   . Feeling of Stress :   Social Connections:   . Frequency of Communication with Friends and Family:   . Frequency of Social Gatherings with Friends and Family:   . Attends Religious Services:   . Active Member of Clubs or Organizations:   . Attends Archivist Meetings:   Marland Kitchen Marital Status:      Family History: The patient's family history includes CAD in an other family member; CAD (age of onset: 72) in her father; CAD (age of onset: 63)  in her mother; Cancer in her maternal aunt; Diabetes in her mother; Rheum arthritis in her mother. There is no history of Stroke.  ROS:   Please see the history of present illness.    No fevers chills nausea vomiting syncope bleeding all other systems reviewed and are negative.  EKGs/Labs/Other Studies Reviewed:    The following studies were reviewed today: Carotid ultrasound mild bilaterally  EKG:  EKG  is  ordered today.  The ekg ordered today demonstrates sinus rhythm 80 with T wave inversion noted in V3 V4 subtle in V5 and V6-no significant change  Recent Labs: 02/18/2019: ALT 9; BUN 21; Creatinine, Ser 1.47; Hemoglobin 12.2; Platelets 253.0; Potassium 4.5; Sodium 139; TSH 0.93  Recent Lipid Panel    Component Value Date/Time   CHOL 156 02/18/2019 0944   CHOL 206 06/20/2014 0000   TRIG 194.0 (H) 02/18/2019 0944   TRIG 155 06/20/2014 0000   HDL 39.30 02/18/2019 0944   HDL 39 06/20/2014 0000   CHOLHDL 4 02/18/2019 0944   VLDL 38.8 02/18/2019 0944   LDLCALC 77 02/18/2019 0944   LDLCALC 135 06/20/2014 0000   LDLDIRECT 81.0 02/16/2018 0924    Physical Exam:    VS:  BP 120/60   Pulse 80   Ht 5' 2.5" (1.588 m)   Wt 148 lb (67.1 kg)   LMP  (LMP Unknown)   SpO2 98%   BMI 26.64 kg/m     Wt Readings from Last 3 Encounters:  08/24/19 148 lb (67.1 kg)  07/26/19 147 lb (66.7 kg)  06/14/19 147 lb 4 oz (66.8 kg)     GEN:  Well nourished, well developed in no acute distress HEENT: Normal NECK: No JVD; No carotid bruits LYMPHATICS: No lymphadenopathy CARDIAC: RRR, no murmurs, rubs, gallops RESPIRATORY:  Clear to auscultation without rales, wheezing or rhonchi  ABDOMEN: Soft, non-tender, non-distended MUSCULOSKELETAL:  No edema; No deformity  SKIN: Warm and dry NEUROLOGIC:  Alert and oriented x 3 PSYCHIATRIC:  Normal affect   ASSESSMENT:    1. Coronary artery disease involving native coronary artery of native heart without angina pectoris   2. Mixed hyperlipidemia   3.  Atherosclerosis of both carotid arteries   4. Stage 3b chronic kidney disease    PLAN:    In order of problems listed above:  Coronary artery disease status post CABG 2003 -Doing well with secondary risk factor modification.  No anginal symptoms.  Hyperlipidemia -In the past unable to tolerate either Pravachol, Lipitor, Zocor.Legs felt like weighed tons.  Now on  Zetia.  LDL 77  Carotid disease -Mild plaque in 2017.  Continue with secondary risk factor prevention.  Chronic kidney disease stage III -Continue to avoid NSAIDs.  Last GFR 34.  Creatinine 1.47.  Medication Adjustments/Labs and Tests Ordered: Current medicines are reviewed at length with the patient today.  Concerns regarding medicines are outlined above.  Orders Placed This Encounter  Procedures  . EKG 12-Lead   No orders of the defined types were placed in this encounter.   Patient Instructions  Medication Instructions:  Your physician recommends that you continue on your current medications as directed. Please refer to the Current Medication list given to you today.  *If you need a refill on your cardiac medications before your next appointment, please call your pharmacy*   Lab Work: If you have labs (blood work) drawn today and your tests are completely normal, you will receive your results only by: Marland Kitchen MyChart Message (if you have MyChart) OR . A paper copy in the mail If you have any lab test that is abnormal or we need to change your treatment, we will call you to review the results.  Follow-Up: At [facility], you and your health needs are our priority.  As part of our continuing mission to provide you with exceptional heart care, we have created designated Provider Care Teams.  These Care Teams include your primary Cardiologist (  physician) and Advanced Practice Providers (APPs -  Physician Assistants and Nurse Practitioners) who all work together to provide you with the care you need, when you need  it.  We recommend signing up for the patient portal called "MyChart".  Sign up information is provided on this After Visit Summary.  MyChart is used to connect with patients for Virtual Visits (Telemedicine).  Patients are able to view lab/test results, encounter notes, upcoming appointments, etc.  Non-urgent messages can be sent to your provider as well.   To learn more about what you can do with MyChart, go to [email or web address].    Your next appointment:   1 year(s)  The format for your next appointment:   In Person  Provider:   You may see Candee Furbish, MD or one of the following Advanced Practice Providers on your designated Care Team:    Truitt Merle, NP  Cecilie Kicks, NP  Kathyrn Drown, NP     Signed, Candee Furbish, MD  08/24/2019 10:31 AM    [place]

## 2019-08-30 ENCOUNTER — Telehealth: Payer: Self-pay | Admitting: Family Medicine

## 2019-08-30 NOTE — Progress Notes (Signed)
  Chronic Care Management   Outreach Note  08/30/2019 Name: Kathleen Williamson MRN: [id number] DOB: May 15, 1940  Referred by: [name], MD Reason for referral : No chief complaint on file.   An unsuccessful telephone outreach was attempted today. The patient was referred to the pharmacist for assistance with care management and care coordination.   This note is not being shared with the patient for the following reason: To respect privacy (The patient or proxy has requested that the information not be shared).  Follow Up Plan:   [name] Upstream Scheduler

## 2019-08-31 ENCOUNTER — Telehealth: Payer: Self-pay | Admitting: Family Medicine

## 2019-08-31 NOTE — Progress Notes (Signed)
  Chronic Care Management   Note  08/31/2019 Name: Kathleen Williamson MRN: [id number] DOB: [birth year]  Kathleen Williamson is a 79 y.o. year old female who is a primary care patient of [name], MD. I reached out to [name] by phone today in response to a referral sent by [name]'s PCP, [name], MD.   [name] was given information about Chronic Care Management services today including:  1. CCM service includes personalized support from designated clinical staff supervised by her physician, including individualized plan of care and coordination with other care providers 2. 24/7 contact phone numbers for assistance for urgent and routine care needs. 3. Service will only be billed when office clinical staff spend 20 minutes or more in a month to coordinate care. 4. Only one practitioner may furnish and bill the service in a calendar month. 5. The patient may stop CCM services at any time (effective at the end of the month) by phone call to the office staff.   Patient agreed to services and verbal consent obtained.  This note is not being shared with the patient for the following reason: To respect privacy (The patient or proxy has requested that the information not be shared). Follow up plan:  [name] Upstream Scheduler

## 2019-08-31 NOTE — Progress Notes (Signed)
  Chronic Care Management   Outreach Note  08/31/2019 Name: Kathleen Williamson MRN: [id number] DOB: [birth year]  Referred by: [name], MD Reason for referral : No chief complaint on file.   An unsuccessful telephone outreach was attempted today. The patient was referred to the pharmacist for assistance with care management and care coordination.   This note is not being shared with the patient for the following reason: To respect privacy (The patient or proxy has requested that the information not be shared).  Follow Up Plan:   [name] Upstream Scheduler

## 2019-09-12 ENCOUNTER — Ambulatory Visit
Admission: EM | Admit: 2019-09-12 | Discharge: 2019-09-12 | Disposition: A | Discharge status: Home / Self Care | Payer: Medicare HMO | Attending: Emergency Medicine | Admitting: Emergency Medicine

## 2019-09-12 DIAGNOSIS — W5501XA Bitten by cat, initial encounter: Secondary | ICD-10-CM | POA: Diagnosis not present

## 2019-09-12 DIAGNOSIS — L03113 Cellulitis of right upper limb: Secondary | ICD-10-CM

## 2019-09-12 MED ORDER — TETANUS-DIPHTH-ACELL PERTUSSIS 5-2.5-18.5 LF-MCG/0.5 IM SUSP
0.5000 mL | Freq: Once | INTRAMUSCULAR | Status: AC
  Administered 2019-09-12: 0.5 mL via INTRAMUSCULAR

## 2019-09-12 MED ORDER — AMOXICILLIN-POT CLAVULANATE 875-125 MG PO TABS
1.0000 | ORAL_TABLET | Freq: Two times a day (BID) | ORAL | 0 refills | Status: AC
Start: 2019-09-12 — End: 2019-09-19

## 2019-09-12 NOTE — Discharge Instructions (Signed)
You received a tetanus shot today.   Take the antibiotic as directed.    Keep your wound clean and dry.  Wash it gently twice a day with soap and water.      Return here if you see signs of worsening infection, such as pain, redness, pus-like drainage, warmth, fever, chills, or other concerning symptoms.

## 2019-09-12 NOTE — ED Provider Notes (Signed)
Roderic Palau    CSN: [id number] Arrival date & time: 09/12/19  1026      History   Chief Complaint Chief Complaint  Patient presents with  . Animal Bite    HPI Kathleen Williamson is a 79 y.o. female.   Patient presents with a cat bite to her right hand which occurred 4 days ago.  The area is red and swollen although she states it has improved since yesterday.  The cat that bit her is her own cat and she states it is up-to-date on its vaccinations including rabies.  Patient denies fever, chills, drainage from the bite, red streaks, or other symptoms.  She has been treating it at home with OTC antibiotic ointment.  Patient states her last tetanus was in 2009.  The history is provided by the patient.    Past Medical History:  Diagnosis Date  . Arthritis    fingers  . CAD (coronary artery disease) 2003   MI s/p 5v CABG  . Carotid stenosis    [place]  . CKD (chronic kidney disease) stage 3, GFR 30-59 ml/min   . Dermatomyositis [facility])    saw Dr Estanislado Pandy, improved on its own  . Diabetes mellitus without complication ([place])   . Forehead laceration 09/15/2017  . History of chicken pox   . History of measles   . History of migraine none since 2003  . HLD (hyperlipidemia)   . Hypothyroidism   . MDD (major depressive disorder), recurrent episode, moderate ([place])    h/o SI after lost husband unexpectedly 2015  . Microscopic colitis 05/2014   by colonoscopy, lymphocytic, zoloft related  . Osteoporosis    DEXA 03/2013 T -3.1, DEXA 10//2016 -2.9 hip, -2.2 spine  . Pancreatic cyst    Dr Ralene Ok, no f/u needed  . Prediabetes 2014  . Subclavian steal syndrome   . Urine incontinence     Patient Active Problem List   Diagnosis Date Noted  . Diarrhea 07/26/2019  . Fear of falling 09/15/2017  . Tongue lesion 02/12/2017  . Osteoarthritis 12/19/2016  . Complicated grief 53/91/2258  . Health maintenance examination 02/07/2016  . Baker's cyst of knee 08/01/2015  . Diabetes mellitus  type 2, controlled, with complications ([place]) 34/62/1947  . Subclavian steal syndrome   . Medicare annual wellness visit, subsequent 01/31/2015  . Advanced care planning/counseling discussion 01/31/2015  . Carotid stenosis 01/31/2015  . Vertebral artery stenosis/occlusion 01/31/2015  . CKD (chronic kidney disease) stage 3, GFR 30-59 ml/min 01/21/2015  . Microscopic colitis 10/26/2014  . Osteoporosis   . Hypothyroidism   . Dermatomyositis ([place])   . MDD (major depressive disorder), recurrent episode, moderate ([place]) 01/12/2014  . Atherosclerosis of native coronary artery of native heart without angina pectoris 12/13/2013  . Hyperlipidemia associated with type 2 diabetes mellitus ([place]) 12/13/2013  . Pseudocyst of pancreas 08/29/2011    Past Surgical History:  Procedure Laterality Date  . APPENDECTOMY  1997   with hysterectomy  . [place]   lower  . BREAST LUMPECTOMY Left 1977   benign  . COLONOSCOPY WITH PROPOFOL N/A 05/23/2014   microscopic colitis - Garlan Fair, MD  . CORONARY ARTERY BYPASS GRAFT  2003   x 5 v  . ESOPHAGOGASTRODUODENOSCOPY (EGD) WITH PROPOFOL N/A 05/23/2014   WNL Garlan Fair, MD  . [place] Right 1990's  . TOOTH EXTRACTION  yrs ago  . TOTAL ABDOMINAL HYSTERECTOMY W/ BILATERAL SALPINGOOPHORECTOMY  1997   endometriosis  OB History   No obstetric history on file.      Home Medications    Prior to Admission medications   Medication Sig Start Date End Date Taking? Authorizing Provider  Accu-Chek FastClix Lancets MISC Use as instructed to check blood sugar once daily 07/14/19   [name], MD  amoxicillin-clavulanate (AUGMENTIN) 875-125 MG tablet Take 1 tablet by mouth every 12 (twelve) hours for 7 days. 09/12/19 09/19/19  Sharion Balloon, NP  aspirin EC 81 MG tablet Take 81 mg by mouth at bedtime.    [provider]  Biotin 5 MG CAPS Take 1 capsule by mouth daily.    [provider]  Blood Glucose Monitoring  Suppl (ACCU-CHEK GUIDE) w/Device KIT 1 each by Does not apply route as directed. Use as instructed to check blood sugar once daily 07/14/19   [name], MD  Calcium Carbonate-Vitamin D 600-400 MG-UNIT tablet Take 1 tablet by mouth 2 (two) times daily.    [provider]  clonazePAM (KLONOPIN) 0.5 MG tablet TAKE 1 TABLET BY MOUTH TWICE A DAY AS NEEDED 08/17/19   [name], MD  Coenzyme Q10 (COQ10) 100 MG CAPS Take 1 capsule by mouth at bedtime.     [provider]  ezetimibe (ZETIA) 10 MG tablet TAKE 1 TABLET BY MOUTH ONCE A DAY 10/08/18   Jerline Pain, MD  Glucosamine-Chondroitin (MOVE FREE PO) Take 2 tablets by mouth daily.    [provider]  glucose blood (ACCU-CHEK GUIDE) test strip Use as instructed to check blood sugar once daily 07/14/19   [name], MD  levothyroxine (SYNTHROID) 50 MCG tablet TAKE 1 TABLET BY MOUTH EVERY DAY 05/02/19   [name], MD  loperamide (IMODIUM A-D) 2 MG tablet Take 2 mg by mouth every morning.    [provider]  mirtazapine (REMERON) 30 MG tablet Take 1 tablet (30 mg total) by mouth at bedtime. 06/14/19   [name], MD  nitroGLYCERIN (NITROLINGUAL) 0.4 MG/SPRAY spray Place 1 spray under the tongue every 5 (five) minutes as needed. 05/23/16   Jerline Pain, MD  pravastatin (PRAVACHOL) 80 MG tablet TAKE 1 TABLET BY MOUTH EVERY DAY IN THE EVENING 08/15/19   Burtis Junes, NP  sertraline (ZOLOFT) 100 MG tablet Take 1 tablet (100 mg total) by mouth daily. 05/13/19   [name], MD  venlafaxine XR (EFFEXOR-XR) 75 MG 24 hr capsule TAKE 1 CAPSULE (75 MG TOTAL) BY MOUTH DAILY WITH BREAKFAST. 05/13/19   [name], MD    Family History Family History  Problem Relation Age of Onset  . CAD Father 92  . CAD Mother 75  . Diabetes Mother   . Rheum arthritis Mother   . Cancer Maternal Aunt        breast  . CAD Other        strong paternal side  . Stroke Neg Hx     Social History Social  History   Tobacco Use  . Smoking status: Never Smoker  . Smokeless tobacco: Never Used  Substance Use Topics  . Alcohol use: No  . Drug use: No     Allergies   Sulfa antibiotics and Crestor [rosuvastatin calcium]   Review of Systems Review of Systems  Constitutional: Negative for chills and fever.  HENT: Negative for ear pain and sore throat.   Eyes: Negative for pain and visual disturbance.  Respiratory: Negative for cough and shortness of breath.   Cardiovascular: Negative for chest pain and palpitations.  Gastrointestinal: Negative for abdominal pain and vomiting.  Genitourinary: Negative for dysuria and hematuria.  Musculoskeletal: Negative for arthralgias and back pain.  Skin: Positive for wound. Negative for color change and rash.  Neurological: Negative for seizures, syncope, weakness and numbness.  All other systems reviewed and are negative.    Physical Exam Triage Vital Signs ED Triage Vitals [09/12/19 1027]  Enc Vitals Group     BP      Pulse      Resp      Temp      Temp src      SpO2      Weight      Height      Head Circumference      Peak Flow      Pain Score 0     Pain Loc      Pain Edu?      Excl. in [place]?    No data found.  Updated Vital Signs BP (!) 146/79 (BP Location: Left Arm)   Pulse 75   Temp 97.8 F (36.6 C) (Temporal)   Resp 16   LMP  (LMP Unknown)   SpO2 96%   Visual Acuity Right Eye Distance:   Left Eye Distance:   Bilateral Distance:    Right Eye Near:   Left Eye Near:    Bilateral Near:     Physical Exam Vitals and nursing note reviewed.  Constitutional:      General: She is not in acute distress.    Appearance: She is well-developed. She is not ill-appearing.  HENT:     Head: Normocephalic and atraumatic.     Mouth/Throat:     Mouth: Mucous membranes are moist.     Pharynx: Oropharynx is clear.  Eyes:     Conjunctiva/sclera: Conjunctivae normal.  Cardiovascular:     Rate and Rhythm: Normal rate and regular  rhythm.     Heart sounds: No murmur.  Pulmonary:     Effort: Pulmonary effort is normal. No respiratory distress.     Breath sounds: Normal breath sounds.  Abdominal:     Palpations: Abdomen is soft.     Tenderness: There is no abdominal tenderness. There is no guarding or rebound.  Musculoskeletal:        General: Normal range of motion.     Cervical back: Neck supple.  Skin:    General: Skin is warm and dry.     Findings: Erythema and lesion present.     Comments: Puncture wounds on right hand with localized erythema.  No streaks or drainage.  See picture for details.   Neurological:     General: No focal deficit present.     Mental Status: She is alert and oriented to person, place, and time.     Sensory: No sensory deficit.     Motor: No weakness.  Psychiatric:        Mood and Affect: Mood normal.        Behavior: Behavior normal.        UC Treatments / Results  Labs (all labs ordered are listed, but only abnormal results are displayed) Labs Reviewed - No data to display  EKG   Radiology No results found.  Procedures Procedures (including critical care time)  Medications Ordered in UC Medications  Tdap (BOOSTRIX) injection 0.5 mL (0.5 mLs Intramuscular Given 09/12/19 1055)    Initial Impression / Assessment and Plan / UC Course  I have reviewed the triage vital signs and the  nursing notes.  Pertinent labs & imaging results that were available during my care of the patient were reviewed by me and considered in my medical decision making (see chart for details).   Cat bite with cellulitis of right hand.  Tetanus updated.  Treating with Augmentin.  Wound care instructions and signs of worsening infection discussed with patient.  Instructed her to return here if she notes signs of worsening infection.  Patient agrees to plan of care.     Final Clinical Impressions(s) / UC Diagnoses   Final diagnoses:  Cat bite, initial encounter  Cellulitis of right hand      Discharge Instructions     You received a tetanus shot today.   Take the antibiotic as directed.    Keep your wound clean and dry.  Wash it gently twice a day with soap and water.      Return here if you see signs of worsening infection, such as pain, redness, pus-like drainage, warmth, fever, chills, or other concerning symptoms.         ED Prescriptions    Medication Sig Dispense Auth. Provider   amoxicillin-clavulanate (AUGMENTIN) 875-125 MG tablet Take 1 tablet by mouth every 12 (twelve) hours for 7 days. 14 tablet Sharion Balloon, NP     PDMP not reviewed this encounter.   Sharion Balloon, NP 09/12/19 1104

## 2019-09-12 NOTE — ED Triage Notes (Signed)
Patient reports her cat bit her Friday evening. Denies pain, but endorses redness and swelling.

## 2019-09-13 ENCOUNTER — Other Ambulatory Visit: Payer: Self-pay | Admitting: Family Medicine

## 2019-09-13 ENCOUNTER — Telehealth: Payer: Self-pay | Admitting: *Deleted

## 2019-09-13 DIAGNOSIS — F331 Major depressive disorder, recurrent, moderate: Secondary | ICD-10-CM

## 2019-09-13 NOTE — Telephone Encounter (Signed)
ERx 

## 2019-09-13 NOTE — Telephone Encounter (Signed)
Received fax from [facility] requesting PA for Clonazepam 0.5 mg.  Attempted PA on CoverMyMeds.  Per CoverMyMeds, medication is available without authorization.  [place] notified of this via fax.

## 2019-09-13 NOTE — Telephone Encounter (Signed)
Last office visit 07/26/2019 for diarrhea.  Last refilled 08/17/2019 for #60 with no refills.  Next Appt: 09/16/19 for 3 month follow up.

## 2019-09-16 ENCOUNTER — Other Ambulatory Visit: Payer: Self-pay

## 2019-09-16 ENCOUNTER — Encounter: Payer: Self-pay | Admitting: Family Medicine

## 2019-09-16 ENCOUNTER — Ambulatory Visit (INDEPENDENT_AMBULATORY_CARE_PROVIDER_SITE_OTHER)
Admission: RE | Admit: 2019-09-16 | Discharge: 2019-09-16 | Disposition: A | Discharge status: Home / Self Care | Payer: Medicare HMO | Source: Ambulatory Visit | Attending: Family Medicine | Admitting: Family Medicine

## 2019-09-16 ENCOUNTER — Ambulatory Visit (INDEPENDENT_AMBULATORY_CARE_PROVIDER_SITE_OTHER): Payer: Medicare HMO | Admitting: Family Medicine

## 2019-09-16 VITALS — BP 134/62 | HR 83 | Temp 97.7°F | Ht 62.5 in | Wt 147.4 lb

## 2019-09-16 DIAGNOSIS — E118 Type 2 diabetes mellitus with unspecified complications: Secondary | ICD-10-CM

## 2019-09-16 DIAGNOSIS — W5501XA Bitten by cat, initial encounter: Secondary | ICD-10-CM | POA: Diagnosis not present

## 2019-09-16 DIAGNOSIS — M810 Age-related osteoporosis without current pathological fracture: Secondary | ICD-10-CM

## 2019-09-16 DIAGNOSIS — S61451A Open bite of right hand, initial encounter: Secondary | ICD-10-CM | POA: Diagnosis not present

## 2019-09-16 DIAGNOSIS — M159 Polyosteoarthritis, unspecified: Secondary | ICD-10-CM

## 2019-09-16 DIAGNOSIS — M8949 Other hypertrophic osteoarthropathy, multiple sites: Secondary | ICD-10-CM

## 2019-09-16 DIAGNOSIS — M1611 Unilateral primary osteoarthritis, right hip: Secondary | ICD-10-CM | POA: Diagnosis not present

## 2019-09-16 DIAGNOSIS — M25551 Pain in right hip: Secondary | ICD-10-CM

## 2019-09-16 DIAGNOSIS — R197 Diarrhea, unspecified: Secondary | ICD-10-CM | POA: Diagnosis not present

## 2019-09-16 DIAGNOSIS — F331 Major depressive disorder, recurrent, moderate: Secondary | ICD-10-CM

## 2019-09-16 LAB — BASIC METABOLIC PANEL
BUN: 18 mg/dL (ref 6–23)
CO2: 28 mEq/L (ref 19–32)
Calcium: 10.1 mg/dL (ref 8.4–10.5)
Chloride: 104 mEq/L (ref 96–112)
Creatinine, Ser: 1.41 mg/dL — ABNORMAL HIGH (ref 0.40–1.20)
GFR: 36.1 mL/min — ABNORMAL LOW (ref >60.00)
Glucose, Bld: 120 mg/dL — ABNORMAL HIGH (ref 70–99)
Potassium: 4.4 mEq/L (ref 3.5–5.1)
Sodium: 138 mEq/L (ref 135–145)

## 2019-09-16 LAB — HEMOGLOBIN A1C: Hgb A1c MFr Bld: 7.1 % — ABNORMAL HIGH (ref 4.6–6.5)

## 2019-09-16 LAB — VITAMIN D 25 HYDROXY (VIT D DEFICIENCY, FRACTURES): VITD: 35.78 ng/mL (ref 30.00–100.00)

## 2019-09-16 NOTE — Patient Instructions (Addendum)
You are doing well today - continue current regimen. Watch for signs of serotonin overdose: worsening anxiety, agitation, sweating, racing heart, high blood pressures, increased temperature GI distress, tremor/shaking, muscle rigidity.  Return in 3 months for follow up visit.

## 2019-09-16 NOTE — Telephone Encounter (Signed)
Pt had labs today and scheduled for inj on 6/23.

## 2019-09-16 NOTE — Progress Notes (Signed)
This visit was conducted in person.  BP 134/62 (BP Location: Left Arm, Patient Position: Sitting, Cuff Size: Normal)    Pulse 83    Temp 97.7 F (36.5 C) (Temporal)    Ht 5' 2.5" (1.588 m)    Wt 147 lb 6 oz (66.8 kg)    LMP  (LMP Unknown)    SpO2 98%    BMI 26.53 kg/m    CC: 3 mo f/u visit  Subjective:    Patient ID: Kathleen Williamson, female    DOB: 11-Jan-1941, 79 y.o.   MRN: [id number]  HPI: Kathleen Williamson is a 79 y.o. female presenting on 09/16/2019 for Depression (Here for 3 mo f/u.)   Seen at [facility] on Monday after cat bite with resultant hand cellulitis treated with augmentin course with benefit - continues this. She did have updated Tdap at that time.   Saw cardiology - good report.    MDD - currently on effexor XR 31m daily and sertraline 10104mdaily and remeron 1564mightly. Feels well on current regimen. Has decided to stop counseling. No intolerable side effects on this regimen. Desires to continue without change.   R hip pain for the past year - points to lateral hip - worse when she stands and walks after prolonged period sitting. Denies inciting trauam/injury or falls. On prolia for known osteoporosis.      Relevant past medical, surgical, family and social history reviewed and updated as indicated. Interim medical history since our last visit reviewed. Allergies and medications reviewed and updated. Outpatient Medications Prior to Visit  Medication Sig Dispense Refill   Accu-Chek FastClix Lancets MISC Use as instructed to check blood sugar once daily 100 each 0   amoxicillin-clavulanate (AUGMENTIN) 875-125 MG tablet Take 1 tablet by mouth every 12 (twelve) hours for 7 days. 14 tablet 0   aspirin EC 81 MG tablet Take 81 mg by mouth at bedtime.     Biotin 5 MG CAPS Take 1 capsule by mouth daily.     Blood Glucose Monitoring Suppl (ACCU-CHEK GUIDE) w/Device KIT 1 each by Does not apply route as directed. Use as instructed to check blood sugar once daily 1 kit 0   Calcium  Carbonate-Vitamin D 600-400 MG-UNIT tablet Take 1 tablet by mouth 2 (two) times daily.     clonazePAM (KLONOPIN) 0.5 MG tablet TAKE 1 TABLET BY MOUTH TWICE A DAY AS NEEDED 60 tablet 1   Coenzyme Q10 (COQ10) 100 MG CAPS Take 1 capsule by mouth at bedtime.      ezetimibe (ZETIA) 10 MG tablet TAKE 1 TABLET BY MOUTH ONCE A DAY 90 tablet 3   Glucosamine-Chondroitin (MOVE FREE PO) Take 2 tablets by mouth daily.     glucose blood (ACCU-CHEK GUIDE) test strip Use as instructed to check blood sugar once daily 100 each 0   levothyroxine (SYNTHROID) 50 MCG tablet TAKE 1 TABLET BY MOUTH EVERY DAY 90 tablet 2   loperamide (IMODIUM A-D) 2 MG tablet Take 2 mg by mouth every morning.     mirtazapine (REMERON) 30 MG tablet Take 1 tablet (30 mg total) by mouth at bedtime. 90 tablet 1   nitroGLYCERIN (NITROLINGUAL) 0.4 MG/SPRAY spray Place 1 spray under the tongue every 5 (five) minutes as needed. 12 g prn   pravastatin (PRAVACHOL) 80 MG tablet TAKE 1 TABLET BY MOUTH EVERY DAY IN THE EVENING 90 tablet 3   sertraline (ZOLOFT) 100 MG tablet Take 1 tablet (100 mg total) by mouth daily. [place]  tablet 1   venlafaxine XR (EFFEXOR-XR) 75 MG 24 hr capsule TAKE 1 CAPSULE (75 MG TOTAL) BY MOUTH DAILY WITH BREAKFAST. 90 capsule 3   No facility-administered medications prior to visit.     Per HPI unless specifically indicated in ROS section below Review of Systems Objective:  BP 134/62 (BP Location: Left Arm, Patient Position: Sitting, Cuff Size: Normal)    Pulse 83    Temp 97.7 F (36.5 C) (Temporal)    Ht 5' 2.5" (1.588 m)    Wt 147 lb 6 oz (66.8 kg)    LMP  (LMP Unknown)    SpO2 98%    BMI 26.53 kg/m   Wt Readings from Last 3 Encounters:  09/16/19 147 lb 6 oz (66.8 kg)  08/24/19 148 lb (67.1 kg)  07/26/19 147 lb (66.7 kg)      Physical Exam Vitals and nursing note reviewed.  Constitutional:      Appearance: Normal appearance. She is not ill-appearing.  Cardiovascular:     Rate and Rhythm: Normal rate  and regular rhythm.     Pulses: Normal pulses.     Heart sounds: Normal heart sounds. No murmur.  Pulmonary:     Effort: Pulmonary effort is normal. No respiratory distress.     Breath sounds: Normal breath sounds. No wheezing, rhonchi or rales.  Musculoskeletal:        General: Normal range of motion.     Right lower leg: No edema.     Left lower leg: No edema.     Comments:  No pain midline spine Neg seated SLR bilaterally. No significant pain with int/ext rotation at hip. No pain at [place] bilaterally.   Skin:    General: Skin is warm and dry.     Findings: No rash.  Neurological:     Mental Status: She is alert.  Psychiatric:        Mood and Affect: Mood normal.        Behavior: Behavior normal.       Results for orders placed or performed in visit on 09/16/19  Hemoglobin A1c  Result Value Ref Range   Hgb A1c MFr Bld 7.1 (H) 4.6 - 6.5 %  Basic metabolic panel  Result Value Ref Range   Sodium 138 135 - 145 mEq/L   Potassium 4.4 3.5 - 5.1 mEq/L   Chloride 104 96 - 112 mEq/L   CO2 28 19 - 32 mEq/L   Glucose, Bld 120 (H) 70 - 99 mg/dL   BUN 18 6 - 23 mg/dL   Creatinine, Ser 1.41 (H) 0.40 - 1.20 mg/dL   GFR 36.10 (L) >60.00 mL/min   Calcium 10.1 8.4 - 10.5 mg/dL  VITAMIN D 25 Hydroxy (Vit-D Deficiency, Fractures)  Result Value Ref Range   VITD 35.78 30.00 - 100.00 ng/mL   Depression screen [facility] 2/9 09/16/2019 06/14/2019 05/13/2019 02/22/2019 02/16/2018  Decreased Interest 0 _0 Down, Depressed, Hopeless 0 _1 PHQ - 2 Score 0 _2 Altered sleeping 0 2 1 0 0  Tired, decreased energy 0 _3 Change in appetite 0 _4 Feeling bad or failure about yourself  0 0 _5 Trouble concentrating 0 0 1 0 0  Moving slowly or fidgety/restless 0 0 0 0 0  Suicidal thoughts 0 0 0 0 0  PHQ-9 Score 0 _6 Difficult doing work/chores - - - -  Not difficult at all  Some recent data might be hidden    GAD 7 : Generalized Anxiety Score 09/16/2019 06/14/2019 05/13/2019  02/22/2019  Nervous, Anxious, on Edge 0 0 2 1  Control/stop worrying 0 0 2 1  Worry too much - different things 0 0 1 1  Trouble relaxing 0 _0 Restless 0 0 0 0  Easily annoyed or irritable 0 0 1 0  Afraid - awful might happen 0 0 0 0  Total GAD 7 Score 0 _1 Assessment & Plan:  This visit occurred during the SARS-CoV-2 public health emergency.  Safety protocols were in place, including screening questions prior to the visit, additional usage of staff PPE, and extensive cleaning of exam room while observing appropriate contact time as indicated for disinfecting solutions.   Problem List Items Addressed This Visit    Right hip pain    Endorses ongoing R hip pain present for months, may be worsening. Overall benign exam.  Update R hip xrays today in prolia use - r/o AVN or atypical fracture prior to next injection.       Relevant Orders   DG Hip Unilat W OR W/O Pelvis 2-3 Views Right   Osteoporosis    Chronic, stable on prolia since 08/2017.  Check labs today.  With new endorsed hip pain, check xray prior to next prolia injection. See below.       Relevant Orders   VITAMIN D 25 Hydroxy (Vit-D Deficiency, Fractures) (Completed)   Osteoarthritis    H/o R hip osteoarthritis. See below.       MDD (major depressive disorder), recurrent episode, moderate ([place]) - Primary    Very stable period on current regimen. Good weather and increased socialization have helped.  She continues SSRI, SNRI and remeron. Reviewed serotonergic medications as well as danger of serotonin syndrome, reviewed possible symptoms to suggest this. She desires to continue current regimen as it is effective, and she has h/o difficult to control MDD.  Has decided to stop counseling.       RESOLVED: Diarrhea    Negative infectious workup. This has resolved. Unclear etiology.       Diabetes mellitus type 2, controlled, with complications ([place])    Update A1c.       Relevant Orders   Hemoglobin A1c  (Completed)   Basic metabolic panel (Completed)   Cat bite of right hand    Saw [facility], Tdap updated, currently on augmentin course with benefit. Continue.           No orders of the defined types were placed in this encounter.  Orders Placed This Encounter  Procedures   DG Hip Unilat W OR W/O Pelvis 2-3 Views Right    Standing Status:   Future    Number of Occurrences:   1    Standing Expiration Date:   09/15/2020    Order Specific Question:   Reason for Exam (SYMPTOM  OR DIAGNOSIS REQUIRED)    Answer:   R hip pain, prolia use    Order Specific Question:   Preferred imaging location?    Answer:   [facility]    Order Specific Question:   Radiology Contrast Protocol - do NOT remove file path    Answer:   [email or web address]   Hemoglobin I1B   Basic metabolic panel   VITAMIN D 25 Hydroxy (Vit-D Deficiency, Fractures)    Patient Instructions  You are doing well today - continue current  regimen. Watch for signs of serotonin overdose: worsening anxiety, agitation, sweating, racing heart, high blood pressures, increased temperature GI distress, tremor/shaking, muscle rigidity.  Return in 3 months for follow up visit.    Follow up plan: Return in about 3 months (around 12/17/2019), or if symptoms worsen or fail to improve, for follow up visit.  [name], MD

## 2019-09-16 NOTE — Telephone Encounter (Signed)
Contacted pt to make lab apt before prolia inj. Pt reports she will be in the office today for apt. Advised this nurse will f/u with her after she sees her PCP to see if he would also like to add any labs. Pt verbalized understanding

## 2019-09-17 DIAGNOSIS — W5501XA Bitten by cat, initial encounter: Secondary | ICD-10-CM | POA: Insufficient documentation

## 2019-09-17 DIAGNOSIS — M1611 Unilateral primary osteoarthritis, right hip: Secondary | ICD-10-CM | POA: Insufficient documentation

## 2019-09-17 DIAGNOSIS — S61451A Open bite of right hand, initial encounter: Secondary | ICD-10-CM | POA: Insufficient documentation

## 2019-09-17 NOTE — Assessment & Plan Note (Signed)
H/o R hip osteoarthritis. See below.

## 2019-09-17 NOTE — Assessment & Plan Note (Signed)
Negative infectious workup. This has resolved. Unclear etiology.

## 2019-09-17 NOTE — Assessment & Plan Note (Addendum)
Endorses ongoing R hip pain present for months, may be worsening. Overall benign exam.  Update R hip xrays today in prolia use - r/o AVN or atypical fracture prior to next injection.

## 2019-09-17 NOTE — Assessment & Plan Note (Addendum)
Very stable period on current regimen. Good weather and increased socialization have helped.  She continues SSRI, SNRI and remeron. Reviewed serotonergic medications as well as danger of serotonin syndrome, reviewed possible symptoms to suggest this. She desires to continue current regimen as it is effective, and she has h/o difficult to control MDD.  Has decided to stop counseling.

## 2019-09-17 NOTE — Assessment & Plan Note (Signed)
Update A1c ?

## 2019-09-17 NOTE — Assessment & Plan Note (Signed)
Chronic, stable on prolia since 08/2017.  Check labs today.  With new endorsed hip pain, check xray prior to next prolia injection. See below.

## 2019-09-17 NOTE — Assessment & Plan Note (Signed)
Saw [facility], Tdap updated, currently on augmentin course with benefit. Continue.

## 2019-09-19 NOTE — Telephone Encounter (Signed)
Ca level normal and CrCl is 35.55mL/min. Pt may proceed with prolia inj.

## 2019-09-20 ENCOUNTER — Encounter: Payer: Self-pay | Admitting: Family Medicine

## 2019-09-27 ENCOUNTER — Ambulatory Visit: Payer: Medicare HMO | Admitting: Psychology

## 2019-09-28 ENCOUNTER — Other Ambulatory Visit: Payer: Self-pay | Admitting: Family Medicine

## 2019-09-28 DIAGNOSIS — M1611 Unilateral primary osteoarthritis, right hip: Secondary | ICD-10-CM

## 2019-10-05 ENCOUNTER — Ambulatory Visit (INDEPENDENT_AMBULATORY_CARE_PROVIDER_SITE_OTHER): Payer: Medicare HMO

## 2019-10-05 DIAGNOSIS — M810 Age-related osteoporosis without current pathological fracture: Secondary | ICD-10-CM | POA: Diagnosis not present

## 2019-10-05 MED ORDER — DENOSUMAB 60 MG/ML ~~LOC~~ SOSY
60.0000 mg | PREFILLED_SYRINGE | Freq: Once | SUBCUTANEOUS | Status: AC
  Administered 2019-10-05: 60 mg via SUBCUTANEOUS

## 2019-10-05 NOTE — Progress Notes (Signed)
Per orders of Dr. [name], injection of Prolia given by Lurlean Nanny.  Patient tolerated injection well.

## 2019-10-10 ENCOUNTER — Other Ambulatory Visit: Payer: Self-pay | Admitting: Family Medicine

## 2019-10-10 ENCOUNTER — Other Ambulatory Visit: Payer: Self-pay | Admitting: Cardiology

## 2019-10-10 DIAGNOSIS — E118 Type 2 diabetes mellitus with unspecified complications: Secondary | ICD-10-CM

## 2019-10-11 DIAGNOSIS — M1611 Unilateral primary osteoarthritis, right hip: Secondary | ICD-10-CM | POA: Diagnosis not present

## 2019-10-11 DIAGNOSIS — M25551 Pain in right hip: Secondary | ICD-10-CM | POA: Diagnosis not present

## 2019-10-13 ENCOUNTER — Other Ambulatory Visit: Payer: Self-pay | Admitting: Family Medicine

## 2019-10-13 DIAGNOSIS — E118 Type 2 diabetes mellitus with unspecified complications: Secondary | ICD-10-CM

## 2019-10-26 ENCOUNTER — Telehealth: Payer: Medicare HMO

## 2019-10-26 ENCOUNTER — Telehealth: Payer: Self-pay

## 2019-10-26 NOTE — Chronic Care Management (AMB) (Deleted)
Chronic Care Management Pharmacy  Name: Kathleen Williamson  MRN: [id number] DOB: [birth year]  Chief Complaint/ HPI  Kathleen Williamson,  79 y.o. , female presents for their {Initial/Follow-up:3041532} CCM visit with the clinical pharmacist {CHL HP Upstream Pharm visit FYBO:[id number]}.  PCP : [name], MD  Their chronic conditions include: hx of CVA, CAD, HLD, DM II, osteoporosis, CKD stage 3, MDD, hypothyroidism  *** 5/19  Office Visits: 09/16/19 OV - MDD 3 mo f/u. Currently on venlafaxine ER 75 mg daily, sertraline 100 mg daily, and mirtazipine 15 mg nightly. Has decided to stop counseling. Patient tolerates current regimen and desires to continue with no change. R hip pain for past year - worse when standing or walking after prolonged period sitting. Denies trauma/or falls. On Prolia for known osteoporosis. F/U visit in 3 months. No changes with medications.  07/26/19 OV - Diarrhea. Present for 3 weeks. Tried imodium, BRAT diet and pepto bismol. No recent abx use. Will rule out GI infection. Hx of microscopic colitis found on colonoscopy 2016. Anticipate recurrent flare of this. SSRI possible contributor. Taper of sertraline if GI panel comes back normal. --- came back normal. Notified patient to decreased sertraline 100 mg 0.5 tablet daily.  Consult Visit: 09/12/19 ED - Cat bite with cellulitis of right hand. Started on Augmentin and tetanus updated.  09/02/19 OV ([place]) - F/U CAD s/p CABG (2003) - HLD now on controlled on ezetimibe. Patient was unable to tolerate pravastatin, atorvastatin, and simvastatin before. LDL of 77. No changes with her medications. F/U in a year.  Medications: Outpatient Encounter Medications as of 10/26/2019  Medication Sig   Accu-Chek FastClix Lancets MISC USE AS INSTRUCTED TO CHECK BLOOD SUGAR ONCE DAILY   ACCU-CHEK GUIDE test strip USE AS INSTRUCTED TO CHECK BLOOD SUGAR ONCE DAILY   aspirin EC 81 MG tablet Take 81 mg by mouth at bedtime.    Biotin 5 MG CAPS Take 1 capsule by mouth daily.   Blood Glucose Monitoring Suppl (ACCU-CHEK GUIDE) w/Device KIT 1 each by Does not apply route as directed. Use as instructed to check blood sugar once daily   Calcium Carbonate-Vitamin D 600-400 MG-UNIT tablet Take 1 tablet by mouth 2 (two) times daily.   clonazePAM (KLONOPIN) 0.5 MG tablet TAKE 1 TABLET BY MOUTH TWICE A DAY AS NEEDED   Coenzyme Q10 (COQ10) 100 MG CAPS Take 1 capsule by mouth at bedtime.    ezetimibe (ZETIA) 10 MG tablet TAKE 1 TABLET BY MOUTH ONCE DAILY   Glucosamine-Chondroitin (MOVE FREE PO) Take 2 tablets by mouth daily.   levothyroxine (SYNTHROID) 50 MCG tablet TAKE 1 TABLET BY MOUTH EVERY DAY   loperamide (IMODIUM A-D) 2 MG tablet Take 2 mg by mouth every morning.   mirtazapine (REMERON) 30 MG tablet Take 1 tablet (30 mg total) by mouth at bedtime.   nitroGLYCERIN (NITROLINGUAL) 0.4 MG/SPRAY spray Place 1 spray under the tongue every 5 (five) minutes as needed.   pravastatin (PRAVACHOL) 80 MG tablet TAKE 1 TABLET BY MOUTH EVERY DAY IN THE EVENING   sertraline (ZOLOFT) 100 MG tablet Take 1 tablet (100 mg total) by mouth daily.   venlafaxine XR (EFFEXOR-XR) 75 MG 24 hr capsule TAKE 1 CAPSULE (75 MG TOTAL) BY MOUTH DAILY WITH BREAKFAST.   No facility-administered encounter medications on file as of 10/26/2019.     Current Diagnosis/Assessment:  Goals Addressed   None     {CHL HP Upstream Pharmacy Diagnosis/Assessment:[phone]}   Hypertension   BP today is:  {  CHL HP UPSTREAM Pharmacist BP [phone]}  Office blood pressures are  BP Readings from Last 3 Encounters:  09/16/19 134/62  09/12/19 (!) 146/79  08/24/19 120/60    Patient has failed these meds in the past: NONE Patient is currently {CHL Controlled/Uncontrolled:[phone]} on the following medications:   Nitrostat 0.4 mg/spray 1 spray under tongue every 5 mins PRN  Patient checks BP at home {CHL HP BP Monitoring  Frequency:[phone]}  Patient home BP readings are ranging: ***  We discussed {CHL HP Upstream Pharmacy discussion:[phone]}  Plan  Continue {CHL HP Upstream Pharmacy Plans:[phone]}    Hyperlipidemia   Lipid Panel     Component Value Date/Time   CHOL 156 02/18/2019 0944   CHOL 206 06/20/2014 0000   TRIG 194.0 (H) 02/18/2019 0944   TRIG 155 06/20/2014 0000   HDL 39.30 02/18/2019 0944   HDL 39 06/20/2014 0000   CHOLHDL 4 02/18/2019 0944   VLDL 38.8 02/18/2019 0944   LDLCALC 77 02/18/2019 0944   LDLCALC 135 06/20/2014 0000   LDLDIRECT 81.0 02/16/2018 0924     The 10-year ASCVD risk score (Goff DC Jr., et al., 2013) is: 34.4%   Values used to calculate the score:     Age: 79 years     Sex: Female     Is Non-Hispanic African American: No     Diabetic: Yes     Tobacco smoker: No     Systolic Blood Pressure: 295 mmHg     Is BP treated: No     HDL Cholesterol: 39.3 mg/dL     Total Cholesterol: 156 mg/dL   Patient has failed these meds in past: niacin, rosuvastatin Patient is currently {CHL Controlled/Uncontrolled:[phone]} on the following medications:   Pravastatin 80 mg 1 tablet in the evening  Ezetimibe 10 mg 1 tablet daily  We discussed:  {CHL HP Upstream Pharmacy discussion:[phone]}  Plan  Continue {CHL HP Upstream Pharmacy Plans:[phone]}    Diabetes   Recent Relevant Labs: Lab Results  Component Value Date/Time   HGBA1C 7.1 (H) 09/16/2019 02:42 PM   HGBA1C 7.2 (H) 02/18/2019 09:44 AM   GFR 36.10 (L) 09/16/2019 02:42 PM   GFR 34.45 (L) 02/18/2019 09:44 AM   MICROALBUR 0.7 02/18/2019 09:44 AM   MICROALBUR <0.7 02/16/2018 10:02 AM     Checking BG: {CHL HP Blood Glucose Monitoring Frequency:[phone]}  Recent FBG Readings: *** Recent pre-meal BG readings: *** Recent 2hr PP BG readings:  *** Recent HS BG readings: ***  Patient has failed these meds in past: NONE Patient is currently {CHL Controlled/Uncontrolled:[phone]} on the  following medications:   NONE  Last diabetic Eye exam:  Lab Results  Component Value Date/Time   HMDIABEYEEXA No Retinopathy 05/17/2019 12:00 AM    Last diabetic Foot exam: No results found for: HMDIABFOOTEX   We discussed: {CHL HP Upstream Pharmacy discussion:[phone]}  Plan  Continue {CHL HP Upstream Pharmacy Plans:[phone]}   Depression   Patient has failed these meds in past: citalopram Patient is currently {CHL Controlled/Uncontrolled:[phone]} on the following medications:   Mirtazapine 30 mg 1 tablet at bedtime  Sertraline 100 mg 1 tablet daily ***  Venlafaxine XR 75 mg 1 capsule daily with breakfast  Clonazepam 0.5 mg 1 tablet twice daily PRN  We discussed:  ***  Plan  Continue {CHL HP Upstream Pharmacy Plans:[phone]}   Hypothyroidism   Lab Results  Component Value Date/Time   TSH 0.93 02/18/2019 09:44 AM   TSH 1.69 02/16/2018 09:24 AM   FREET4 0.84 02/16/2018 09:24 AM  Patient has failed these meds in past: NONE Patient is currently {CHL Controlled/Uncontrolled:[phone]} on the following medications:   Levothyroxine 50 mcg 1 tablet daily   We discussed:  {CHL HP Upstream Pharmacy discussion:[phone]}  Plan  Continue {CHL HP Upstream Pharmacy OYDXA:[id number]}   OTCs/Health Maintenance   Patient is currently {CHL Controlled/Uncontrolled:[phone]} on the following medications:  Aspirin EC 81 mg 1 tablet at bedtime  Biotin 5 mg 1 capsule daily  Calcium + Vit D 600 mg / 400 units 1 tablet twice daily  CoQ10 100 mg 1 capsule daily  Glucosamine-Chondroitin 2 tablets daily  Loperamide 2 mg 1 tablet every morning  We discussed:  ***  Plan  Continue {CHL HP Upstream Pharmacy CNOBS:[id number]}   Vaccines   Reviewed and discussed patient's vaccination history.    Immunization History  Administered Date(s) Administered   Fluad Quad(high Dose 65+) 12/28/2018   Influenza, High Dose Seasonal PF 12/18/2017,  12/23/2018   Influenza,inj,Quad PF,6+ Mos 01/22/2015, 02/04/2016, 12/19/2016   Influenza-Unspecified 04/01/2011   Moderna SARS-COVID-2 Vaccination 04/29/2019, 05/27/2019   Pneumococcal Conjugate-13 02/04/2016   Pneumococcal-Unspecified 03/24/2007   Td 03/24/2007, 09/05/2016   Tdap 09/09/2017, 01/25/2018, 09/12/2019   Zoster 04/14/2009   Zoster Recombinat (Shingrix) 02/20/2017, 05/02/2017    Plan  Recommended patient receive *** vaccine in *** office.    Medication Management   Pharmacy/Benefits: [facility] [address] Adherence:  Pt endorses ***% compliance  We discussed: ***  Plan  {US Pharmacy TMLY:65035}   Follow up: *** month phone visit  CPP Chart prep 9:17:47 10:03:50 0:46:03

## 2019-10-26 NOTE — Telephone Encounter (Signed)
Patient has concerns regarding her osteoarthritis referral that she would like to discuss with Dr. Darnell Level. She is requesting a phone call. She reports it is not urgent, so whenever is best for you.  Debbora Dus, PharmD Clinical Pharmacist [facility] [phone]

## 2019-10-26 NOTE — Chronic Care Management (AMB) (Deleted)
Chronic Care Management Pharmacy  Name: LIONA WENGERT  MRN: [id number] DOB: December 11, 1940  Chief Complaint/ HPI  [name],  79 y.o. , female presents for their {Initial/Follow-up:3041532} CCM visit with the clinical pharmacist {CHL HP Upstream Pharm visit IRWE:[id number]}.  PCP : [name], MD  Their chronic conditions include: hx of CVA, CAD, HLD, DM II, osteoporosis, CKD stage 3, MDD, hypothyroidism  *** 5/19  Office Visits: 09/16/19 OV - MDD 3 mo f/u. Currently on venlafaxine ER 75 mg daily, sertraline 100 mg daily, and mirtazipine 15 mg nightly. Has decided to stop counseling. Patient tolerates current regimen and desires to continue with no change. R hip pain for past year - worse when standing or walking after prolonged period sitting. Denies trauma/or falls. On Prolia for known osteoporosis. F/U visit in 3 months. No changes with medications.  07/26/19 OV - Diarrhea. Present for 3 weeks. Tried imodium, BRAT diet and pepto bismol. No recent abx use. Will rule out GI infection. Hx of microscopic colitis found on colonoscopy 2016. Anticipate recurrent flare of this. SSRI possible contributor. Taper of sertraline if GI panel comes back normal. --- came back normal. Notified patient to decreased sertraline 100 mg 0.5 tablet daily.  Consult Visit: 09/12/19 ED - Cat bite with cellulitis of right hand. Started on Augmentin and tetanus updated.  09/02/19 OV ([facility], [facility]) - F/U CAD s/p CABG (2003) - HLD now on controlled on ezetimibe. Patient was unable to tolerate pravastatin, atorvastatin, and simvastatin before. LDL of 77. No changes with her medications. F/U in a year.  Medications: Outpatient Encounter Medications as of 10/26/2019  Medication Sig  . Accu-Chek FastClix Lancets MISC USE AS INSTRUCTED TO CHECK BLOOD SUGAR ONCE DAILY  . ACCU-CHEK GUIDE test strip USE AS INSTRUCTED TO CHECK BLOOD SUGAR ONCE DAILY  . aspirin EC 81 MG tablet Take 81 mg by mouth at bedtime.  .  Biotin 5 MG CAPS Take 1 capsule by mouth daily.  . Blood Glucose Monitoring Suppl (ACCU-CHEK GUIDE) w/Device KIT 1 each by Does not apply route as directed. Use as instructed to check blood sugar once daily  . Calcium Carbonate-Vitamin D 600-400 MG-UNIT tablet Take 1 tablet by mouth 2 (two) times daily.  . clonazePAM (KLONOPIN) 0.5 MG tablet TAKE 1 TABLET BY MOUTH TWICE A DAY AS NEEDED  . Coenzyme Q10 (COQ10) 100 MG CAPS Take 1 capsule by mouth at bedtime.   Marland Kitchen ezetimibe (ZETIA) 10 MG tablet TAKE 1 TABLET BY MOUTH ONCE DAILY  . Glucosamine-Chondroitin (MOVE FREE PO) Take 2 tablets by mouth daily.  Marland Kitchen levothyroxine (SYNTHROID) 50 MCG tablet TAKE 1 TABLET BY MOUTH EVERY DAY  . loperamide (IMODIUM A-D) 2 MG tablet Take 2 mg by mouth every morning.  . mirtazapine (REMERON) 30 MG tablet Take 1 tablet (30 mg total) by mouth at bedtime.  . nitroGLYCERIN (NITROLINGUAL) 0.4 MG/SPRAY spray Place 1 spray under the tongue every 5 (five) minutes as needed.  . pravastatin (PRAVACHOL) 80 MG tablet TAKE 1 TABLET BY MOUTH EVERY DAY IN THE EVENING  . sertraline (ZOLOFT) 100 MG tablet Take 1 tablet (100 mg total) by mouth daily.  Marland Kitchen venlafaxine XR (EFFEXOR-XR) 75 MG 24 hr capsule TAKE 1 CAPSULE (75 MG TOTAL) BY MOUTH DAILY WITH BREAKFAST.   No facility-administered encounter medications on file as of 10/26/2019.     Current Diagnosis/Assessment:  Goals Addressed   None     {CHL HP Upstream Pharmacy Diagnosis/Assessment:[phone]}   Hypertension   BP today is:  {  CHL HP UPSTREAM Pharmacist BP ranges:[id number]} ° °Office blood pressures are  °BP Readings from Last 3 Encounters:  °09/16/19 134/62  °09/12/19 (!) 146/79  °08/24/19 120/60  ° ° °Patient has failed these meds in the past: NONE °Patient is currently {CHL Controlled/Uncontrolled:2109141014} on the following medications:  °• Nitrostat 0.4 mg/spray 1 spray under tongue every 5 mins PRN ° °Patient checks BP at home {CHL HP BP Monitoring  Frequency:2109141004} ° °Patient home BP readings are ranging: *** ° °We discussed {CHL HP Upstream Pharmacy discussion:2109141007} ° °Plan ° °Continue {CHL HP Upstream Pharmacy Plans:2109141003}  ° ° °Hyperlipidemia  ° °Lipid Panel  °   °Component Value Date/Time  ° CHOL 156 02/18/2019 0944  ° CHOL 206 06/20/2014 0000  ° TRIG 194.0 (H) 02/18/2019 0944  ° TRIG 155 06/20/2014 0000  ° HDL 39.30 02/18/2019 0944  ° HDL 39 06/20/2014 0000  ° CHOLHDL 4 02/18/2019 0944  ° VLDL 38.8 02/18/2019 0944  ° LDLCALC 77 02/18/2019 0944  ° LDLCALC 135 06/20/2014 0000  ° LDLDIRECT 81.0 02/16/2018 0924  °  ° °The 10-year ASCVD risk score (Goff DC Jr., et al., 2013) is: 34.4% °  Values used to calculate the score: °    Age: 78 years °    Sex: Female °    Is Non-Hispanic African American: No °    Diabetic: Yes °    Tobacco smoker: No °    Systolic Blood Pressure: 130 mmHg °    Is BP treated: No °    HDL Cholesterol: 39.3 mg/dL °    Total Cholesterol: 156 mg/dL  ° °Patient has failed these meds in past: niacin, rosuvastatin °Patient is currently {CHL Controlled/Uncontrolled:2109141014} on the following medications:  °• Pravastatin 80 mg 1 tablet in the evening °• Ezetimibe 10 mg 1 tablet daily ° °We discussed:  {CHL HP Upstream Pharmacy discussion:2109141007} ° °Plan ° °Continue {CHL HP Upstream Pharmacy Plans:2109141003}  ° ° °Diabetes  ° °Recent Relevant Labs: °Lab Results  °Component Value Date/Time  ° HGBA1C 7.1 (H) 09/16/2019 02:42 PM  ° HGBA1C 7.2 (H) 02/18/2019 09:44 AM  ° GFR 36.10 (L) 09/16/2019 02:42 PM  ° GFR 34.45 (L) 02/18/2019 09:44 AM  ° MICROALBUR 0.7 02/18/2019 09:44 AM  ° MICROALBUR <0.7 02/16/2018 10:02 AM  °  ° °Checking BG: {CHL HP Blood Glucose Monitoring Frequency:2109141001} ° °Recent FBG Readings: *** °Recent pre-meal BG readings: *** °Recent 2hr PP BG readings:  *** °Recent HS BG readings: *** ° °Patient has failed these meds in past: NONE °Patient is currently {CHL Controlled/Uncontrolled:2109141014} on the  following medications:  °• NONE ° °Last diabetic Eye exam:  °Lab Results  °Component Value Date/Time  ° HMDIABEYEEXA No Retinopathy 05/17/2019 12:00 AM  °  °Last diabetic Foot exam: No results found for: HMDIABFOOTEX  ° °We discussed: {CHL HP Upstream Pharmacy discussion:2109141007} ° °Plan ° °Continue {CHL HP Upstream Pharmacy Plans:2109141003} ° ° °Depression  ° °Patient has failed these meds in past: citalopram °Patient is currently {CHL Controlled/Uncontrolled:2109141014} on the following medications:  °• Mirtazapine 30 mg 1 tablet at bedtime °• Sertraline 100 mg 1 tablet daily *** °• Venlafaxine XR 75 mg 1 capsule daily with breakfast °• Clonazepam 0.5 mg 1 tablet twice daily PRN ° °We discussed:  *** ° °Plan ° °Continue {CHL HP Upstream Pharmacy Plans:2109141003} ° ° °Hypothyroidism  ° °Lab Results  °Component Value Date/Time  ° TSH 0.93 02/18/2019 09:44 AM  ° TSH 1.69 02/16/2018 09:24 AM  ° FREET4 0.84 02/16/2018 09:24 AM  ° ° °  Patient has failed these meds in past: NONE Patient is currently {CHL Controlled/Uncontrolled:930 461 9398} on the following medications:  . Levothyroxine 50 mcg 1 tablet daily   We discussed:  {CHL HP Upstream Pharmacy discussion:[phone]}  Plan  Continue {CHL HP Upstream Pharmacy OBSJG:[id number]}   OTCs/Health Maintenance   Patient is currently {CHL Controlled/Uncontrolled:930 461 9398} on the following medications: . Aspirin EC 81 mg 1 tablet at bedtime . Biotin 5 mg 1 capsule daily . Calcium + Vit D 600 mg / 400 units 1 tablet twice daily . CoQ10 100 mg 1 capsule daily . Glucosamine-Chondroitin 2 tablets daily . Loperamide 2 mg 1 tablet every morning  We discussed:  ***  Plan  Continue {CHL HP Upstream Pharmacy LYYTK:[id number]}   Vaccines   Reviewed and discussed patient's vaccination history.    Immunization History  Administered Date(s) Administered  . Fluad Quad(high Dose 65+) 12/28/2018  . Influenza, High Dose Seasonal PF 12/18/2017,  12/23/2018  . Influenza,inj,Quad PF,6+ Mos 01/22/2015, 02/04/2016, 12/19/2016  . Influenza-Unspecified 04/01/2011  . Moderna SARS-COVID-2 Vaccination 04/29/2019, 05/27/2019  . Pneumococcal Conjugate-13 02/04/2016  . Pneumococcal-Unspecified 03/24/2007  . Td 03/24/2007, 09/05/2016  . Tdap 09/09/2017, 01/25/2018, 09/12/2019  . Zoster 04/14/2009  . Zoster Recombinat (Shingrix) 02/20/2017, 05/02/2017    Plan  Recommended patient receive *** vaccine in *** office.    Medication Management   Pharmacy/Benefits: [facility] [address] [facility] Adherence:  Pt endorses ***% compliance  We discussed: ***  Plan  {US Pharmacy NTZG:[id number]}   Follow up: *** month phone visit  CPP Chart prep 9:17:47 10:03:50 0:46:03

## 2019-10-26 NOTE — Telephone Encounter (Signed)
Unable to reach patient for initial CCM visit today, 10/26/19 at 11:00 AM. Patient called back this afternoon to reschedule for next month, December 02, 2019 at 2:00 PM for initial telephone visit.   Debbora Dus, PharmD Clinical Pharmacist [facility] [phone]

## 2019-10-26 NOTE — Telephone Encounter (Signed)
Spoke with patient.  Saw [facility]. Told needed hip replacement.  Does not want to have surgery at [facility] affiliated center.  Previously saw Dr Onnie Graham (shoulder) and Dr Erlinda Hong (back).   She's started riding recumbent bike at [place] with improvement in pain. Would like to defer ortho eval at this time, will let me know if changes her mind - would want to go to [facility] if needed in the future.

## 2019-11-07 ENCOUNTER — Other Ambulatory Visit: Payer: Self-pay | Admitting: Family Medicine

## 2019-11-07 NOTE — Telephone Encounter (Signed)
Rx was last refilled on 05/13/19 for #90 with 1 refill.  Patient was last seen on 09/16/19. OK to refill?

## 2019-11-08 ENCOUNTER — Other Ambulatory Visit: Payer: Self-pay | Admitting: Family Medicine

## 2019-11-08 NOTE — Telephone Encounter (Signed)
Last office visit 09/16/2019 for MDD.  Last refilled Mirtazapine 30 mg 06/14/2019 for #90 with 1 refill.  Mirtazapine not on medication list.  No future appointments with PCP.

## 2019-11-09 NOTE — Telephone Encounter (Signed)
Will refill mirtazapine 15mg  dose.

## 2019-11-11 ENCOUNTER — Other Ambulatory Visit: Payer: Self-pay | Admitting: Family Medicine

## 2019-11-11 DIAGNOSIS — F331 Major depressive disorder, recurrent, moderate: Secondary | ICD-10-CM

## 2019-11-11 NOTE — Telephone Encounter (Signed)
Last office visit 09/16/2019 for MDD.  Last refilled 09/13/2019 for #60 with 1 refill.  No future appointments with PCP.

## 2019-11-11 NOTE — Telephone Encounter (Signed)
ERx 

## 2019-11-16 ENCOUNTER — Other Ambulatory Visit: Payer: Self-pay | Admitting: Family Medicine

## 2019-11-17 NOTE — Telephone Encounter (Signed)
E-scribed levothyroxine refill.  Denied citalopram 20 mg.  Stopped by Dr. Darnell Level and started sertraline 100 mg tab daily.  (see OV, 05/13/19)

## 2019-11-22 ENCOUNTER — Other Ambulatory Visit: Payer: Self-pay

## 2019-11-22 ENCOUNTER — Encounter: Payer: Self-pay | Admitting: Podiatry

## 2019-11-22 ENCOUNTER — Ambulatory Visit: Payer: Medicare HMO | Admitting: Podiatry

## 2019-11-22 DIAGNOSIS — R200 Anesthesia of skin: Secondary | ICD-10-CM

## 2019-11-22 DIAGNOSIS — B351 Tinea unguium: Secondary | ICD-10-CM

## 2019-11-22 DIAGNOSIS — E118 Type 2 diabetes mellitus with unspecified complications: Secondary | ICD-10-CM

## 2019-11-22 NOTE — Progress Notes (Signed)
Subjective:  Patient ID: Kathleen Williamson, female    DOB: 02/11/1941,  MRN: [id number]  Chief Complaint  Patient presents with  . Nail Problem    nail trim and DFC   No complaints today  . Diabetes   79 y.o. female returns for the above complaint.  Patient presents with painful toenails x10.  She states that it hurts when she is ambulating.  She states that she has been walking a lot especially since pandemic.  She denies any other acute complaints.  Patient also has secondary complaint of right plantar foot numbness that occurs after wearing certain type of shoes.  Patient states is directly related to certain type of shoe gear that she wears.  She states it goes away and is very sporadic in nature if she is not wearing the shoes.  She denies any other acute complaints.  She has not tried anything for it.  She just wanted to discuss some treatment plans if there are any.  Objective:   There were no vitals filed for this visit. Podiatric Exam: Vascular: dorsalis pedis and posterior tibial pulses are palpable bilateral. Capillary return is immediate. Temperature gradient is WNL. Skin turgor WNL  Sensorium: Normal Semmes Weinstein monofilament test. Normal tactile sensation bilaterally. Nail Exam: Pt has thick disfigured discolored nails with subungual debris noted bilateral entire nail hallux through fifth toenails Ulcer Exam: There is no evidence of ulcer or pre-ulcerative changes or infection. Orthopedic Exam: Muscle tone and strength are WNL. No limitations in general ROM. No crepitus or effusions noted. HAV  B/L.  Hammer toes 2-5  B/L. Skin: No Porokeratosis. No infection or ulcers  Assessment & Plan:  Patient was evaluated and treated and all questions answered.  Numbness to the right side plantar foot -I explained to the patient the etiology of numbness and various treatment options were discussed.  Given that this is happening sporadically likely do some type of shoe gear that she  wears it may be putting excessive pressure to the tarsal tunnel region.  Patient does not have any Tinel's sign.  In setting of this I discussed with her shoe gear modifications very beneficial.  Patient states understanding will do that right away.  Onychomycosis with pain  -Nails palliatively debrided as below. -Educated on self-care  Bilateral xerosis to the lower extremity -I explained to the patient the etiology of xerosis and various treatment options were extensively discussed.  I explained to the patient the importance of maintaining moisturization of the skin with application of over-the-counter lotion such as Eucerin or Luciderm.  I have asked the patient to apply this twice a day.  If unable to resolve patient will benefit from prescription lotion.  Bilateral hallux valgus deformity -I discussed with the patient the etiology of bunion deformity and various treatment options associated with it.  Currently patient does not have pain and therefore she can continue offloading even with wider shoe gear modification.  Patient states understanding -I believe patient will benefit from diabetic insoles given that she has a bunion deformity bilaterally with hammertoe contracture.  I will have her see Liliane Channel to have the diabetic insoles made.  Procedure: Nail Debridement Rationale: pain  Type of Debridement: manual, sharp debridement. Instrumentation: Nail nipper, rotary burr. Number of Nails: 10  Procedures and Treatment: Consent by patient was obtained for treatment procedures. The patient understood the discussion of treatment and procedures well. All questions were answered thoroughly reviewed. Debridement of mycotic and hypertrophic toenails, 1 through 5 bilateral and  clearing of subungual debris. No ulceration, no infection noted.  Return Visit-Office Procedure: Patient instructed to return to the office for a follow up visit 3 months for continued evaluation and treatment.  Boneta Lucks,  DPM    No follow-ups on file.

## 2019-11-28 NOTE — Chronic Care Management (AMB) (Signed)
Chronic Care Management Pharmacy  Name: Kathleen Williamson  MRN: [id number] DOB: Dec 27, 1940  Chief Complaint/ HPI  Kathleen Williamson,  79 y.o. , female presents for their Initial CCM visit with the clinical pharmacist via telephone due to COVID-19 Pandemic.  PCP : Kathleen Bush, MD  Their chronic conditions include: atherosclerosis of native coronary artery, carotid stenosis, hyperlipidemia, hypothyroidism, diabetes type 2, osteoporosis, osteoarthritis, CKD, major depressive disorder.   Office Visits:  10/05/2019 - Prolia given in office.   09/16/2019 - worsening right hip pain ordered updated xrays. Depression - patient stopped counseling. Continuing current regimen of SSRI, SNRI and remeron.   08/16/2019  - Sertraline and venlafaxine do work similarly but are different families of antidepressants. She has done well on this dose venlafaxine up to now. However, given sertraline not really effective anymore, reasonable to take her off this, but I would want her to continue her venlafaxine - as suddenly stopping can cause bad discontinuation symptoms.   07/26/2019 - 3 week h/o diarrhea. Present for 3 weeks. No recent abx use, but will start by ruling out infection with GI pathogen panel. She will pick this up today. Does not sound like malabsorption issue. In h/o microscopic colitis found on colonoscopy 2016, anticipate recurrent flare of this. Discussed sertraline as possible contributor - if GI pathogen panel normal, will taper off sertraline onto another SSRI. Ok to continue pepto bismol, continue efforts towards good hydration status. Reduce sertraline to 1/2 tablet daily due to diarrhea.   06/14/2019 - follow-up on depression - decrease mirtazapine dose to 30 mg nightly due to oversedation. Encouraged continued walking out doors and social engagement.  Consult Visit:  11/22/2019 - Podiatry - nail debridement. Modifying shoe gear.   10/11/2019 [facility] - osteoarthritis of  hip. Patient counseled on need for total hip arthroplasty. Patient would like to follow-up with ortho in [place].   09/12/2019 - Urgent Care - cat bite on hand. Cellulitis - updated Tetanus and treating with Augmentin.   08/24/2019 - continue current therapy. Patient unable to tolerate pravastatin, atorvastatin and simvastatin in the past.   08/16/2019 - Podiatry - onychomycosis with pain. Bilateral xerosis to lower extremity. Ordered diabetic insoles to help with bunion. Nail debridement performed in office.   06/24/2019 - mammogram.  Medications: Outpatient Encounter Medications as of 12/02/2019  Medication Sig  . Accu-Chek FastClix Lancets MISC USE AS INSTRUCTED TO CHECK BLOOD SUGAR ONCE DAILY  . ACCU-CHEK GUIDE test strip USE AS INSTRUCTED TO CHECK BLOOD SUGAR ONCE DAILY  . aspirin EC 81 MG tablet Take 81 mg by mouth at bedtime.  . Biotin 5 MG CAPS Take 1 capsule by mouth daily.  . Blood Glucose Monitoring Suppl (ACCU-CHEK GUIDE) w/Device KIT 1 each by Does not apply route as directed. Use as instructed to check blood sugar once daily  . Calcium Carbonate-Vitamin D 600-400 MG-UNIT tablet Take 1 tablet by mouth 2 (two) times daily.  . citalopram (CELEXA) 20 MG tablet Take 20 mg by mouth in the morning.  . clonazePAM (KLONOPIN) 0.5 MG tablet TAKE 1 TABLET BY MOUTH TWICE A DAY AS NEEDED  . Coenzyme Q10 (COQ10) 100 MG CAPS Take 1 capsule by mouth at bedtime.   Marland Kitchen ezetimibe (ZETIA) 10 MG tablet TAKE 1 TABLET BY MOUTH ONCE DAILY  . levothyroxine (SYNTHROID) 50 MCG tablet TAKE 1 TABLET BY MOUTH EVERY DAY  . loperamide (IMODIUM A-D) 2 MG tablet Take 2 mg by mouth as needed.   . mirtazapine (  REMERON) 15 MG tablet TAKE 1 TABLET BY MOUTH EVERYDAY AT BEDTIME (Patient taking differently: Take 30 mg by mouth at bedtime. )  . nitroGLYCERIN (NITROLINGUAL) 0.4 MG/SPRAY spray Place 1 spray under the tongue every 5 (five) minutes as needed.  . pravastatin (PRAVACHOL) 80 MG tablet TAKE 1 TABLET BY MOUTH  EVERY DAY IN THE EVENING  . venlafaxine XR (EFFEXOR-XR) 75 MG 24 hr capsule TAKE 1 CAPSULE (75 MG TOTAL) BY MOUTH DAILY WITH BREAKFAST.  Marland Kitchen Glucosamine-Chondroitin (MOVE FREE PO) Take 2 tablets by mouth daily. (Patient not taking: Reported on 12/02/2019)  . sertraline (ZOLOFT) 100 MG tablet TAKE 1 TABLET BY MOUTH EVERY DAY (Patient not taking: Reported on 12/02/2019)   No facility-administered encounter medications on file as of 12/02/2019.   Allergies  Allergen Reactions  . Sulfa Antibiotics Nausea Only  . Crestor [Rosuvastatin Calcium] Other (See Comments)    Leg ache   SDOH Screenings   Alcohol Screen:   . Last Alcohol Screening Score (AUDIT): Not on file  Depression (PHQ2-9): Low Risk   . PHQ-2 Score: 0  Financial Resource Strain:   . Difficulty of Paying Living Expenses: Not on file  Food Insecurity: No Food Insecurity  . Worried About Charity fundraiser in the Last Year: Never true  . Ran Out of Food in the Last Year: Never true  Housing: Low Risk   . Last Housing Risk Score: 0  Physical Activity:   . Days of Exercise per Week: Not on file  . Minutes of Exercise per Session: Not on file  Social Connections:   . Frequency of Communication with Friends and Family: Not on file  . Frequency of Social Gatherings with Friends and Family: Not on file  . Attends Religious Services: Not on file  . Active Member of Clubs or Organizations: Not on file  . Attends Archivist Meetings: Not on file  . Marital Status: Not on file  Stress:   . Feeling of Stress : Not on file  Tobacco Use: Low Risk   . Smoking Tobacco Use: Never Smoker  . Smokeless Tobacco Use: Never Used  Transportation Needs: No Transportation Needs  . Lack of Transportation (Medical): No  . Lack of Transportation (Non-Medical): No     Current Diagnosis/Assessment:  Goals Addressed            This Visit's Progress   . Pharmacy Care Plan       CARE PLAN ENTRY (see longitudinal plan of care for  additional care plan information)  Current Barriers:  . Chronic Disease Management support, education, and care coordination needs related to Hyperlipidemia, Diabetes, and Depression    Hyperlipidemia Lab Results  Component Value Date/Time   LDLCALC 77 02/18/2019 09:44 AM   LDLCALC 135 06/20/2014 12:00 AM   LDLDIRECT 81.0 02/16/2018 09:24 AM   . Pharmacist Clinical Goal(s): o Over the next 90 days, patient will work with PharmD and providers to achieve LDL goal < 70 . Current regimen:  o Pravastatin 80 mg daily . Aspirin EC 81 mg daily . Zetia 10 mg daily . Nitroglycerin 0.4 mcg every 5 minutes prn . Interventions: o Comprehensive medication review completed.  o Recommend patient continue exercising in gym 3 times each week.  o Encouraged healthy diet of lean meat, vegetables, fruits and whole grains.  . Patient self care activities - Over the next 90 days, patient will: o Continue to take medications as prescribed.  o Contact provider or pharmacist with any questions.  Diabetes Lab Results  Component Value Date/Time   HGBA1C 7.1 (H) 09/16/2019 02:42 PM   HGBA1C 7.2 (H) 02/18/2019 09:44 AM   . Pharmacist Clinical Goal(s): o Over the next 90 days, patient will work with PharmD and providers to achieve A1c goal <7% . Current regimen:  o Diet and Lifestyle . Interventions: o Keep up the good work with exercise on the recumbent bike three times weekly.  o Discussed patient's diet and encouraged lean protein, vegetables, fruit and whole grains.  . Patient self care activities - Over the next 90 days, patient will: o Check blood sugar once daily, document, and provide at future appointments o Contact provider with any episodes of hypoglycemia  Depression . Pharmacist Clinical Goal(s) o Over the next 90 days, patient will work with PharmD and providers to maintain good control of depression symptoms.  . Current regimen:  Marland Kitchen Mirtazapine 30 mg daily at bedtime . Citalopram 20  mg daily . Venlafaxine XR 75 mg daily with breakfast . Clonazepam 0.5 mg bid prn . Interventions: o Patient indicates that she is managed by citalopram 20 mg daily. Dr. Bosie Clos records indicates sertraline 100 mg daily. Patient denies using sertraline at this time and states she switched back to citalopram 20 mg daily and is pleased with her response. She is out of refills and [facility]  . Patient self care activities - Over the next 90 days, patient will: o Interior and spatial designer or provider with any questions or concerns.  o Pick up citalopram if Dr. Danise Mina approves.   Medication management . Pharmacist Clinical Goal(s): o Over the next 90 days, patient will work with PharmD and providers to maintain optimal medication adherence . [facility] . Interventions o Comprehensive medication review performed. o Continue current medication management strategy . Patient self care activities - Over the next 90 days, patient will: o Focus on medication adherence by using daily pill box.  o Take medications as prescribed o Report any questions or concerns to PharmD and/or provider(s)  Initial goal documentation        Diabetes   Recent Relevant Labs: Lab Results  Component Value Date/Time   HGBA1C 7.1 (H) 09/16/2019 02:42 PM   HGBA1C 7.2 (H) 02/18/2019 09:44 AM   MICROALBUR 0.7 02/18/2019 09:44 AM   MICROALBUR <0.7 02/16/2018 10:02 AM    Kidney Function Lab Results  Component Value Date/Time   CREATININE 1.41 (H) 09/16/2019 02:42 PM   CREATININE 1.47 (H) 02/18/2019 09:44 AM   CREATININE 1.25 06/20/2014 12:00 AM   CREATININE 1.43 (H) 01/31/2014 07:25 PM   CREATININE 1.1 08/26/2011 09:27 AM   GFR 36.10 (L) 09/16/2019 02:42 PM   GFRNONAA 38 (L) 01/31/2014 07:25 PM   GFRAA 46 (L) 01/31/2014 07:25 PM   K 4.4 09/16/2019 02:42 PM   K 4.5 02/18/2019 09:44 AM   K 4.8 06/20/2014 12:00 AM   K 4.2 01/31/2014 07:25 PM   K 4.4 08/26/2011 09:27  AM    Checking BG: Weekly  Recent FBG Readings: 145 mg/dL this morning  Patient has failed these meds in past: n/a Patient is currently uncontrolled on the following medications:   accu-chek fast clix lancets daily   accu-chek guide test strips daily     Last diabetic Foot exam:  Lab Results  Component Value Date/Time   HMDIABEYEEXA No Retinopathy 05/17/2019 12:00 AM    Last diabetic Eye exam: No results found for: HMDIABFOOTEX   We discussed: diet and  exercise extensively. Patient feels bad when sugar is 120 or less. She feels best between 130-140 mg/dL. She controls this diet and lifestyle. Walks around her community or the walking track but utilizes the campus gym three times weekly to ride the recumbent bike for 30 minutes. The recumbent bike doesn't bother her hip like walking. Patient admits she is not eating right lately. It is hard to cook for 1 person. There are 2 restaurants on campus that she can order from. She doesn't have a large appetite. She loves fruit but doesn't eat enough vegetables/fish according to patient reports. Breakfast is fruit with an egg or cereal. Does not eat a lot of bread or enjoy sandwiches.   Plan  Continue control with diet and exercise   Hyperlipidemia   LDL goal < 70  Lipid Panel     Component Value Date/Time   CHOL 156 02/18/2019 0944   CHOL 206 06/20/2014 0000   TRIG 194.0 (H) 02/18/2019 0944   TRIG 155 06/20/2014 0000   HDL 39.30 02/18/2019 0944   HDL 39 06/20/2014 0000   LDLCALC 77 02/18/2019 0944   LDLCALC 135 06/20/2014 0000   LDLDIRECT 81.0 02/16/2018 0924    Hepatic Function Latest Ref Rng & Units 02/18/2019 09/29/2018 02/16/2018  Total Protein 6.0 - 8.3 g/dL 6.7 - 7.0  Albumin 3.5 - 5.2 g/dL 4.1 4.1 4.2  AST 0 - 37 U/L 11 - 14  ALT 0 - 35 U/L 9 - 13  Alk Phosphatase 39 - 117 U/L 45 - 44  Total Bilirubin 0.2 - 1.2 mg/dL 0.5 - 0.6     The 10-year ASCVD risk score Mikey Bussing DC Jr., et al., 2013) is: 34.4%   Values used to  calculate the score:     Age: 55 years     Sex: Female     Is Non-Hispanic African American: No     Diabetic: Yes     Tobacco smoker: No     Systolic Blood Pressure: 711 mmHg     Is BP treated: No     HDL Cholesterol: 39.3 mg/dL     Total Cholesterol: 156 mg/dL   Patient has failed these meds in past: niacin, rosuvastatin Patient is currently uncontrolled on the following medications:  . Aspirin EC 81 mg daily . Zetia 10 mg daily . Nitroglycerin 0.4 mcg every 5 minutes prn . Pravastatin 80 mg daily  We discussed:  diet and exercise extensively.Has had heart surgery - 5 bypasses in 2003. Patient has an intolerance to crestor listed in chart. Cardiology manages her medication and is pleased with her lipid panel per patient. Patient keeps nitroglycerin with her at all times. Encouraged healthy diet of lean protein, fruits, vegetables and whole grains. Encouraged patient's continued exercise.   Plan  Continue current medications  Hypothyroidism   Lab Results  Component Value Date/Time   TSH 0.93 02/18/2019 09:44 AM   TSH 1.69 02/16/2018 09:24 AM   FREET4 0.84 02/16/2018 09:24 AM    Patient has failed these meds in past: n/a Patient is currently controlled on the following medications:  . Levothyroxine 50 mcg daily  We discussed: Patient indicates good adherence to medication.   Plan  Continue current medications   Depression   Patient has failed these meds in past: sertraline Patient is currently controlled on the following medications:  Marland Kitchen Mirtazapine 30 mg daily at bedtime . Citalopram 20 mg daily . Venlafaxine XR 75 mg daily with breakfast . Clonazepam 0.5 mg bid prn  We discussed:  Patient states that her husband died suddenly 6 years ago. She is pleased with how she is doing lately. She tries to not dwell on things that she has no control over. She remains active at the gym three times weekly. She has friends that she speaks with regularly on campus. 2020 was a  hard year because couldn't be with her friends regularly. Has a cat that is great company to her and was born [place] the farm before she transitioned to [facility] [employer] [facility].   Patient reports she is taking citalopram with good result. Dr. Synthia Innocent notes/records indicate patient is taking sertraline. Pharmacist reaching out to Dr. Darnell Level to clarify. Patient needs refill of citalopram if continued.   Plan  Continue current medications. Contact Dr. Darnell Level to determine appropriate SSRI.     Osteopenia / Osteoporosis   Last DEXA Scan: 06/2019  T-Score total hip: -2.7   T-Score lumbar spine: -2.1    VITD  Date Value Ref Range Status  09/16/2019 35.78 30.00 - 100.00 ng/mL Final     Patient is a candidate for pharmacologic treatment due to T-Score < -2.5 in total hip   Patient has failed these meds in past: none reported Patient is currently controlled on the following medications:  . Calcium carbonate-Vitamin D 600-400 mg-unit twice daily . Prolia every 6 months since 05/19.   We discussed:  Recommend [phone] units of vitamin D daily. Recommend 1200 mg of calcium daily from dietary and supplemental sources. Recommend weight-bearing and muscle strengthening exercises for building and maintaining bone density.  Plan  Continue current medications   Health Maintenance   Patient is currently controlled on the following medications:  . Loperamide 2 mg daily every morning prn - supplementation . Coenzyme q10 100 mg daily at bedtime - supplementation . Biotin 5 mg daily - supplementation   We discussed:  Patient has periodic diarrhea resolved by prn use of loperamide.   Plan  Continue current medications  Vaccines   Reviewed and discussed patient's vaccination history.    Immunization History  Administered Date(s) Administered  . Fluad Quad(high Dose 65+) 12/28/2018  . Influenza, High Dose Seasonal PF 12/18/2017, 12/23/2018  . Influenza,inj,Quad PF,6+ Mos 01/22/2015, 02/04/2016, 12/19/2016  .  Influenza-Unspecified 04/01/2011  . Moderna SARS-COVID-2 Vaccination 04/29/2019, 05/27/2019  . Pneumococcal Conjugate-13 02/04/2016  . Pneumococcal-Unspecified 03/24/2007  . Td 03/24/2007, 09/05/2016  . Tdap 09/09/2017, 01/25/2018, 09/12/2019  . Zoster 04/14/2009  . Zoster Recombinat (Shingrix) 02/20/2017, 05/02/2017    Plan  Recommended patient receive annual flu vaccine in office.   Medication Management   [facility] Uses pill box? Yes - uses daily pill box.  Pt endorses good compliance  We discussed: Patient likes to fill up pill box daily. Denies the need for packaging or delivery. [facility] [place] [facility] [place] and is pleased.  Patient's medications are affordable at this time.   Plan  Continue current medication management strategy    Follow up: 6 month phone visit

## 2019-12-02 ENCOUNTER — Ambulatory Visit: Payer: Medicare HMO

## 2019-12-02 ENCOUNTER — Telehealth: Payer: Self-pay

## 2019-12-02 NOTE — Patient Instructions (Signed)
Visit Information  Thank you for your time discussing your medications. I look forward to working with you to achieve your health care goals. Below is a summary of what we talked about during our visit.   Goals Addressed            This Visit's Progress   . Pharmacy Care Plan       CARE PLAN ENTRY (see longitudinal plan of care for additional care plan information)  Current Barriers:  . Chronic Disease Management support, education, and care coordination needs related to Hyperlipidemia, Diabetes, and Depression    Hyperlipidemia Lab Results  Component Value Date/Time   LDLCALC 77 02/18/2019 09:44 AM   LDLCALC 135 06/20/2014 12:00 AM   LDLDIRECT 81.0 02/16/2018 09:24 AM   . Pharmacist Clinical Goal(s): o Over the next 90 days, patient will work with PharmD and providers to achieve LDL goal < 70 . Current regimen:  o Pravastatin 80 mg daily . Aspirin EC 81 mg daily . Zetia 10 mg daily . Nitroglycerin 0.4 mcg every 5 minutes prn . Interventions: o Comprehensive medication review completed.  o Recommend patient continue exercising in gym 3 times each week.  o Encouraged healthy diet of lean meat, vegetables, fruits and whole grains.  . Patient self care activities - Over the next 90 days, patient will: o Continue to take medications as prescribed.  o Contact provider or pharmacist with any questions.   Diabetes Lab Results  Component Value Date/Time   HGBA1C 7.1 (H) 09/16/2019 02:42 PM   HGBA1C 7.2 (H) 02/18/2019 09:44 AM   . Pharmacist Clinical Goal(s): o Over the next 90 days, patient will work with PharmD and providers to achieve A1c goal <7% . Current regimen:  o Diet and Lifestyle . Interventions: o Keep up the good work with exercise on the recumbent bike three times weekly.  o Discussed patient's diet and encouraged lean protein, vegetables, fruit and whole grains.  . Patient self care activities - Over the next 90 days, patient will: o Check blood sugar once  daily, document, and provide at future appointments o Contact provider with any episodes of hypoglycemia  Depression . Pharmacist Clinical Goal(s) o Over the next 90 days, patient will work with PharmD and providers to maintain good control of depression symptoms.  . Current regimen:  Marland Kitchen Mirtazapine 30 mg daily at bedtime . Citalopram 20 mg daily . Venlafaxine XR 75 mg daily with breakfast . Clonazepam 0.5 mg bid prn . Interventions: o Patient indicates that she is managed by citalopram 20 mg daily. Dr. Bosie Clos records indicates sertraline 100 mg daily. Patient denies using sertraline at this time and states she switched back to citalopram 20 mg daily and is pleased with her response. She is out of refills and [facility]  . Patient self care activities - Over the next 90 days, patient will: o Interior and spatial designer or provider with any questions or concerns.  o Pick up citalopram if Dr. Danise Mina approves.   Medication management . Pharmacist Clinical Goal(s): o Over the next 90 days, patient will work with PharmD and providers to maintain optimal medication adherence . [facility] . Interventions o Comprehensive medication review performed. o Continue current medication management strategy . Patient self care activities - Over the next 90 days, patient will: o Focus on medication adherence by using daily pill box.  o Take medications as prescribed o Report any questions or concerns to PharmD and/or provider(s)  Initial goal documentation        Ms. Surles was given information about Chronic Care Management services today including:  1. CCM service includes personalized support from designated clinical staff supervised by her physician, including individualized plan of care and coordination with other care providers 2. 24/7 contact phone numbers for assistance for urgent and routine care needs. 3. Standard insurance, coinsurance, copays  and deductibles apply for chronic care management only during months in which we provide at least 20 minutes of these services. Most insurances cover these services at 100%, however patients may be responsible for any copay, coinsurance and/or deductible if applicable. This service may help you avoid the need for more expensive face-to-face services. 4. Only one practitioner may furnish and bill the service in a calendar month. 5. The patient may stop CCM services at any time (effective at the end of the month) by phone call to the office staff.  Patient agreed to services and verbal consent obtained.   The patient verbalized understanding of instructions provided today and agreed to receive a mailed copy of patient instruction and/or educational materials. Telephone follow up appointment with pharmacy team member scheduled for: 05/2020  Sherre Poot, PharmD Clinical Pharmacist [place] [phone] (office) [phone] (mobile)

## 2019-12-02 NOTE — Progress Notes (Signed)
Patient had initial CCM call with pharmacist today. Upon review of her home medications, she is taking citalopram 20 mg daily and states that sertraline was discontinued. She reports symptoms well managed with citalopram.   She is currently out of refills of Citalopram 20 mg and would like [facility] if approved.   Thank you,  Sherre Poot, PharmD, [facility] Clinical Pharmacist [facility] [phone] (office) [phone] (mobile)

## 2019-12-03 MED ORDER — CITALOPRAM HYDROBROMIDE 20 MG PO TABS: 20.0000 mg | ORAL_TABLET | Freq: Every morning | ORAL | 1 refills | Status: DC

## 2019-12-03 NOTE — Telephone Encounter (Signed)
Not sure how sertraline was changed to citalopram but she feels doing well on current regimen - will refill citalopram.

## 2019-12-03 NOTE — Addendum Note (Signed)
Addended by: [name] on: 12/03/2019 06:55 PM   Modules accepted: Orders

## 2019-12-04 NOTE — Progress Notes (Signed)
I have collaborated with the care management provider regarding care management and care coordination activities outlined in this encounter and have reviewed this encounter including documentation in the note and care plan. I am certifying that I agree with the content of this note and encounter as supervising physician.  

## 2020-01-09 ENCOUNTER — Telehealth: Payer: Self-pay | Admitting: *Deleted

## 2020-01-09 DIAGNOSIS — M1611 Unilateral primary osteoarthritis, right hip: Secondary | ICD-10-CM

## 2020-01-09 NOTE — Telephone Encounter (Signed)
Patient called stating that she has been having hip pain for years. Patient stated that she was told when she was ready for hip surgery to call and let Dr. Danise Mina know. Patient stated that she thinks that she is ready for surgery and to have a hip replacement. Patient stated that she has been to [facility] and does not want to go back there. Patient stated that she would like to go to [facility] in [place] because she has been there before.

## 2020-01-09 NOTE — Telephone Encounter (Signed)
New referral placed to [facility].

## 2020-01-10 NOTE — Telephone Encounter (Signed)
Referral has been placed to Dr. Wynelle Link.

## 2020-01-10 NOTE — Telephone Encounter (Signed)
Pt prefers to be scheduled w/Dr. Gaynelle Arabian at [facility] if possible please.  Thank you!

## 2020-01-11 ENCOUNTER — Other Ambulatory Visit: Payer: Self-pay | Admitting: Family Medicine

## 2020-01-11 DIAGNOSIS — F331 Major depressive disorder, recurrent, moderate: Secondary | ICD-10-CM

## 2020-01-11 NOTE — Telephone Encounter (Signed)
Name of Medication: Clonazepam Name of Pharmacy: [facility] or Written Date and Quantity: 12/13/19, #60 Last Office Visit and Type: 09/16/19, MDD f/u Next Office Visit and Type: none Last Controlled Substance Agreement Date: 12/19/16 Last UDS: 12/19/16

## 2020-01-13 NOTE — Telephone Encounter (Signed)
ERx 

## 2020-01-17 DIAGNOSIS — D485 Neoplasm of uncertain behavior of skin: Secondary | ICD-10-CM | POA: Diagnosis not present

## 2020-01-17 DIAGNOSIS — L820 Inflamed seborrheic keratosis: Secondary | ICD-10-CM | POA: Diagnosis not present

## 2020-01-25 DIAGNOSIS — M1611 Unilateral primary osteoarthritis, right hip: Secondary | ICD-10-CM | POA: Diagnosis not present

## 2020-02-01 DIAGNOSIS — M1611 Unilateral primary osteoarthritis, right hip: Secondary | ICD-10-CM | POA: Diagnosis not present

## 2020-02-10 ENCOUNTER — Other Ambulatory Visit: Payer: Self-pay | Admitting: Family Medicine

## 2020-02-10 DIAGNOSIS — F331 Major depressive disorder, recurrent, moderate: Secondary | ICD-10-CM

## 2020-02-10 NOTE — Telephone Encounter (Signed)
ERx 

## 2020-02-10 NOTE — Telephone Encounter (Signed)
Name of Medication: Clonazepam Name of Pharmacy: [facility] or Written Date and Quantity: 01/13/20, #60 Last Office Visit and Type: 09/16/19, 3 mo f/u Next Office Visit and Type: none Last Controlled Substance Agreement Date: 12/19/16 Last UDS: 12/19/16

## 2020-02-27 NOTE — Progress Notes (Signed)

## 2020-02-28 ENCOUNTER — Other Ambulatory Visit: Payer: Self-pay

## 2020-02-28 ENCOUNTER — Ambulatory Visit: Payer: Medicare HMO | Admitting: Podiatry

## 2020-02-28 ENCOUNTER — Encounter: Payer: Self-pay | Admitting: Podiatry

## 2020-02-28 DIAGNOSIS — E118 Type 2 diabetes mellitus with unspecified complications: Secondary | ICD-10-CM | POA: Diagnosis not present

## 2020-02-28 DIAGNOSIS — M79675 Pain in left toe(s): Secondary | ICD-10-CM

## 2020-02-28 DIAGNOSIS — B351 Tinea unguium: Secondary | ICD-10-CM | POA: Diagnosis not present

## 2020-02-28 DIAGNOSIS — M79674 Pain in right toe(s): Secondary | ICD-10-CM

## 2020-02-28 NOTE — Progress Notes (Signed)
  Subjective:  Patient ID: Kathleen Williamson, female    DOB: 01-Jun-1940,  MRN: [id number]  Chief Complaint  Patient presents with  . Nail Problem    nail trim and DFC   No complaints today  . Diabetes   79 y.o. female returns for the above complaint.  Patient presents with painful toenails x10.  She states that it hurts when she is ambulating.  She states that she has been walking a lot especially since pandemic.  She denies any other acute complaints.    Objective:   There were no vitals filed for this visit. Podiatric Exam: Vascular: dorsalis pedis and posterior tibial pulses are palpable bilateral. Capillary return is immediate. Temperature gradient is WNL. Skin turgor WNL  Sensorium: Normal Semmes Weinstein monofilament test. Normal tactile sensation bilaterally. Nail Exam: Pt has thick disfigured discolored nails with subungual debris noted bilateral entire nail hallux through fifth toenails Ulcer Exam: There is no evidence of ulcer or pre-ulcerative changes or infection. Orthopedic Exam: Muscle tone and strength are WNL. No limitations in general ROM. No crepitus or effusions noted. HAV  B/L.  Hammer toes 2-5  B/L. Skin: No Porokeratosis. No infection or ulcers  Assessment & Plan:  Patient was evaluated and treated and all questions answered.  Numbness to the right side plantar foot -I explained to the patient the etiology of numbness and various treatment options were discussed.  Given that this is happening sporadically likely do some type of shoe gear that she wears it may be putting excessive pressure to the tarsal tunnel region.  Patient does not have any Tinel's sign.  In setting of this I discussed with her shoe gear modifications very beneficial.  Patient states understanding will do that right away.  Onychomycosis with pain  -Nails palliatively debrided as below. -Educated on self-care  Bilateral xerosis to the lower extremity -I explained to the patient the etiology of  xerosis and various treatment options were extensively discussed.  I explained to the patient the importance of maintaining moisturization of the skin with application of over-the-counter lotion such as Eucerin or Luciderm.  I have asked the patient to apply this twice a day.  If unable to resolve patient will benefit from prescription lotion.  Bilateral hallux valgus deformity -I discussed with the patient the etiology of bunion deformity and various treatment options associated with it.  Currently patient does not have pain and therefore she can continue offloading even with wider shoe gear modification.  Patient states understanding -I believe patient will benefit from diabetic insoles given that she has a bunion deformity bilaterally with hammertoe contracture.  I will have her see Liliane Channel to have the diabetic insoles made.  Procedure: Nail Debridement Rationale: pain  Type of Debridement: manual, sharp debridement. Instrumentation: Nail nipper, rotary burr. Number of Nails: 10  Procedures and Treatment: Consent by patient was obtained for treatment procedures. The patient understood the discussion of treatment and procedures well. All questions were answered thoroughly reviewed. Debridement of mycotic and hypertrophic toenails, 1 through 5 bilateral and clearing of subungual debris. No ulceration, no infection noted.  Return Visit-Office Procedure: Patient instructed to return to the office for a follow up visit 3 months for continued evaluation and treatment.  Boneta Lucks, DPM    No follow-ups on file.

## 2020-03-12 ENCOUNTER — Other Ambulatory Visit: Payer: Self-pay | Admitting: Family Medicine

## 2020-03-12 DIAGNOSIS — F331 Major depressive disorder, recurrent, moderate: Secondary | ICD-10-CM

## 2020-03-12 NOTE — Telephone Encounter (Signed)
ERx 

## 2020-03-22 DIAGNOSIS — M1611 Unilateral primary osteoarthritis, right hip: Secondary | ICD-10-CM | POA: Diagnosis not present

## 2020-04-10 ENCOUNTER — Other Ambulatory Visit: Payer: Self-pay | Admitting: Family Medicine

## 2020-04-10 ENCOUNTER — Telehealth: Payer: Self-pay

## 2020-04-10 DIAGNOSIS — F331 Major depressive disorder, recurrent, moderate: Secondary | ICD-10-CM

## 2020-04-10 NOTE — Telephone Encounter (Signed)
Name of Medication: Clonazepam Name of Pharmacy: [facility] Last Fill or Written Date and Quantity: 03/13/20, #60 Last Office Visit and Type:  09/16/19, MDD f/u Next Office Visit and Type: none Last Controlled Substance Agreement Date: 12/19/16 Last UDS: 12/19/16

## 2020-04-10 NOTE — Telephone Encounter (Signed)
Plz schedule wellness, lab and cpe visits.  

## 2020-04-13 NOTE — Telephone Encounter (Signed)
ERx 

## 2020-04-30 ENCOUNTER — Other Ambulatory Visit: Payer: Self-pay | Admitting: Family Medicine

## 2020-05-08 ENCOUNTER — Telehealth: Payer: Self-pay | Admitting: *Deleted

## 2020-05-08 NOTE — Telephone Encounter (Signed)
The nurse director at [facility] called with pt in room with her. PCP d/c her depression med a while ago and since then nurse advised me her depression is worsening and now it sever. Pt doesn't really want to do her ADLs, and staff is very worried about her, and requested that we try and have pt seen ASAP. Spoke to [employer] and advise her of the situation and she said scheduled for 12:30pm tomorrow 05/09/20 an send phone note to PCP to make sure that's okay with him.   appt scheduled and note routed to PCP

## 2020-05-08 NOTE — Telephone Encounter (Signed)
Spoke with pt asking her to bring all mood meds with her to OV tomorrow.  Pt verbalizes understanding.

## 2020-05-08 NOTE — Telephone Encounter (Signed)
Noted. Will see tomorrow.  I had not stopped any of her medications.  Please have her bring all mood medicines to appointment tomorrow.

## 2020-05-09 ENCOUNTER — Telehealth: Payer: Self-pay | Admitting: Family Medicine

## 2020-05-09 ENCOUNTER — Ambulatory Visit (INDEPENDENT_AMBULATORY_CARE_PROVIDER_SITE_OTHER): Payer: PPO | Admitting: Family Medicine

## 2020-05-09 ENCOUNTER — Encounter: Payer: Self-pay | Admitting: Family Medicine

## 2020-05-09 ENCOUNTER — Other Ambulatory Visit: Payer: Self-pay

## 2020-05-09 VITALS — BP 120/68 | HR 83 | Temp 97.6°F | Ht 62.5 in | Wt 154.2 lb

## 2020-05-09 DIAGNOSIS — F332 Major depressive disorder, recurrent severe without psychotic features: Secondary | ICD-10-CM

## 2020-05-09 DIAGNOSIS — R45851 Suicidal ideations: Secondary | ICD-10-CM

## 2020-05-09 MED ORDER — VENLAFAXINE HCL ER 150 MG PO CP24: 150.0000 mg | ORAL_CAPSULE | Freq: Every day | ORAL | 1 refills | Status: DC

## 2020-05-09 MED ORDER — CITALOPRAM HYDROBROMIDE 20 MG PO TABS: 10.0000 mg | ORAL_TABLET | Freq: Every morning | ORAL | 1 refills | Status: DC
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
## 2020-05-09 NOTE — Telephone Encounter (Signed)
Attempted to reach pt regarding referral to Dr. Nicolasa Ducking. No answer and no voicemail. Tried both phone numbers on file.

## 2020-05-09 NOTE — Telephone Encounter (Signed)
Please call Friday AM for update on depression and see where we are with status of psychiatry referral and setting up for counselor through [facility].

## 2020-05-09 NOTE — Assessment & Plan Note (Addendum)
Acute worsening of chronic major depression, situational (neighbor stressors) and seasonal (?SAD component).  Endorses SI with plan but doesn't have access to guns and contracts for safety. She will call her good friend Judson Roch today who has previously acted as support system. Advised she establish with counselor - she thinks she can do this through [facility]. Given severe depression despite current 3 drug regimen, I did recommend psychiatry referral.  In interim, will increase effexor XR to 150mg  daily and slowly taper off celexa over the next month, with possible addition of adjuvant antidepressant subsequently.  Short term f/u indicated - RTC 2 wks f/u, ER and 911 precautions extensively reviewed.

## 2020-05-09 NOTE — Patient Instructions (Addendum)
I recommend establishing with counselor. Call [facility] counselor Malon Kindle) to see how to set this up. If you need referral from [name], let us know.  I will refer you to psychiatrist for medication management assistance.  If you have worsening suicidality - call 911 or go to the ER.  Increase effexor to 150mg  once daily, drop celexa dose to 10mg  daily (cut current tablets in half). New effexor dose sent to pharmacy. Ultimately we will come off celexa fully and may add another mood medicine once off celexa.  Return in 2 weeks for follow up visit.

## 2020-05-09 NOTE — Telephone Encounter (Signed)
Noted  

## 2020-05-11 ENCOUNTER — Other Ambulatory Visit: Payer: Self-pay | Admitting: Family Medicine

## 2020-05-11 DIAGNOSIS — F331 Major depressive disorder, recurrent, moderate: Secondary | ICD-10-CM

## 2020-05-11 NOTE — Telephone Encounter (Signed)
ERx 

## 2020-05-11 NOTE — Telephone Encounter (Signed)
Spoke with pt asking for update.  Says she feels much better today.  Reports a HA last night, which thinks is possible side effect of med increase.  However, it eased of quickly.  Pt wants to express her thanks to Dr. Darnell Level for caring so much for her.

## 2020-05-11 NOTE — Telephone Encounter (Signed)
Name of Medication: Clonazepam Name of Pharmacy: [place] or Written Date and Quantity: 04/13/20, #60 Last Office Visit and Type: 05/09/20, MDD f/u Next Office Visit and Type: 05/29/20, 2 wk MDD f/u Last Controlled Substance Agreement Date: 12/19/16 Last UDS: 12/19/16, scanned

## 2020-05-15 NOTE — Telephone Encounter (Signed)
Noted! Thank you

## 2020-05-15 NOTE — Telephone Encounter (Signed)
Called and spoke with patient today in regards to the psychiatry referral. Patient states she spoke with a Counselor on campus at [facility] and was given number for [facility] to see Miguel Dibble. I asked patient if the counselor knew that patient needed to see a psychiatrist and she said she did. Patient called [facility] office and left a message for call back.  I also advised patient that Dr Darnell Level wanted patient to try Dr Nicolasa Ducking in [place] and I provided patient that number and asked her to go ahead and call after our phone call and leave them a message about getting scheduled. I asked patient to call me back once she had more information and who she is seen and if she forgets to do so I did tell patient I would also follow up with her on this.  FYI to Dr Darnell Level to update on status of this.

## 2020-05-21 NOTE — Telephone Encounter (Signed)
Great to hear.  Thank you.

## 2020-05-21 NOTE — Telephone Encounter (Signed)
Spoke with patient today. Patient is scheduled to see Otila Kluver with [facility] on 06/06/20 and is scheduled to see Dr Nicolasa Ducking on 08/01/20.

## 2020-05-22 DIAGNOSIS — E119 Type 2 diabetes mellitus without complications: Secondary | ICD-10-CM | POA: Diagnosis not present

## 2020-05-22 LAB — HM DIABETES EYE EXAM

## 2020-05-23 ENCOUNTER — Encounter: Payer: Self-pay | Admitting: Family Medicine

## 2020-05-24 ENCOUNTER — Telehealth: Payer: Self-pay

## 2020-05-24 ENCOUNTER — Telehealth: Payer: Medicare HMO

## 2020-05-24 NOTE — Chronic Care Management (AMB) (Addendum)
Chronic Care Management Pharmacy Assistant   Name: Kathleen Williamson  MRN: [id number] DOB: 1940-06-27  Reason for Encounter: Disease State- Diabetes  Patient Question:  1.  Have you seen any other providers or had any medication changes since your last visit with Debbora Dus, Pharm. D? Yes  05/29/20- Dr. Danise Mina- PCP- Stopped citalopram. 05/09/20- Dr. Danise Mina- PCP- Decreased citalopram from 20 mg daily to 10 mg daily. Increased venlafaxine from 75 mg daily to 150 mg daily. 03/22/20- Theresa Duty, PA-C- Orthopedics 02/28/20- Dr. Boneta Lucks- Podiatry 02/01/20- Dr. Suella Broad- Orthopedics 02/12/20- Gerrit Halls, PA-C Orthopedics 01/17/20- Dr. Danella Sensing - Dermatology   PCP : [name], MD  Allergies:   Allergies  Allergen Reactions   Sulfa Antibiotics Nausea Only   Crestor [Rosuvastatin Calcium] Other (See Comments)    Leg ache    Medications: Outpatient Encounter Medications as of 05/24/2020  Medication Sig   Accu-Chek FastClix Lancets MISC USE AS INSTRUCTED TO CHECK BLOOD SUGAR ONCE DAILY   ACCU-CHEK GUIDE test strip USE AS INSTRUCTED TO CHECK BLOOD SUGAR ONCE DAILY   aspirin EC 81 MG tablet Take 81 mg by mouth at bedtime.   Blood Glucose Monitoring Suppl (ACCU-CHEK GUIDE) w/Device KIT 1 each by Does not apply route as directed. Use as instructed to check blood sugar once daily   Calcium Carbonate-Vitamin D 600-400 MG-UNIT tablet Take 1 tablet by mouth 2 (two) times daily. (Patient not taking: Reported on 05/09/2020)   citalopram (CELEXA) 20 MG tablet Take 0.5 tablets (10 mg total) by mouth in the morning.   clonazePAM (KLONOPIN) 0.5 MG tablet TAKE 1 TABLET BY MOUTH TWICE A DAY AS NEEDED   Coenzyme Q10 (COQ10) 100 MG CAPS Take 1 capsule by mouth at bedtime.    ezetimibe (ZETIA) 10 MG tablet TAKE 1 TABLET BY MOUTH ONCE DAILY   Glucosamine-Chondroitin (MOVE FREE PO) Take 2 tablets by mouth daily.   levothyroxine (SYNTHROID) 50 MCG tablet TAKE 1 TABLET BY  MOUTH EVERY DAY   loperamide (IMODIUM A-D) 2 MG tablet Take 2 mg by mouth as needed.    mirtazapine (REMERON) 15 MG tablet TAKE 1 TABLET BY MOUTH EVERYDAY AT BEDTIME   nitroGLYCERIN (NITROLINGUAL) 0.4 MG/SPRAY spray Place 1 spray under the tongue every 5 (five) minutes as needed.   pravastatin (PRAVACHOL) 80 MG tablet TAKE 1 TABLET BY MOUTH EVERY DAY IN THE EVENING   venlafaxine XR (EFFEXOR-XR) 150 MG 24 hr capsule Take 1 capsule (150 mg total) by mouth daily with breakfast.   No facility-administered encounter medications on file as of 05/24/2020.    Current Diagnosis: Patient Active Problem List   Diagnosis Date Noted   Cat bite of right hand 09/17/2019   Osteoarthritis of right hip 09/17/2019   Fear of falling 09/15/2017   Tongue lesion 02/12/2017   Osteoarthritis 54/62/7035   Complicated grief 00/93/8182   Health maintenance examination 02/07/2016   Baker's cyst of knee 08/01/2015   Diabetes mellitus type 2, controlled, with complications ([place]) 99/37/1696   Subclavian steal syndrome    Medicare annual wellness visit, subsequent 01/31/2015   Advanced care planning/counseling discussion 01/31/2015   Carotid stenosis 01/31/2015   Vertebral artery stenosis/occlusion 01/31/2015   CKD (chronic kidney disease) stage 3, GFR 30-59 ml/min ([place]) 01/21/2015   Microscopic colitis 10/26/2014   Osteoporosis    Hypothyroidism    Dermatomyositis ([place])    MDD (major depressive disorder), recurrent severe, without psychosis ([place]) 01/12/2014   Atherosclerosis of native coronary artery of native heart without  angina pectoris 12/13/2013   Hyperlipidemia associated with type 2 diabetes mellitus ([place]) 12/13/2013   Pseudocyst of pancreas 08/29/2011    Recent Relevant Labs: Lab Results  Component Value Date/Time   HGBA1C 7.1 (H) 09/16/2019 02:42 PM   HGBA1C 7.2 (H) 02/18/2019 09:44 AM   MICROALBUR 0.7 02/18/2019 09:44 AM   MICROALBUR <0.7 02/16/2018 10:02 AM    Kidney Function Lab Results   Component Value Date/Time   CREATININE 1.41 (H) 09/16/2019 02:42 PM   CREATININE 1.47 (H) 02/18/2019 09:44 AM   CREATININE 1.25 06/20/2014 12:00 AM   CREATININE 1.43 (H) 01/31/2014 07:25 PM   CREATININE 1.1 08/26/2011 09:27 AM   GFR 36.10 (L) 09/16/2019 02:42 PM   GFRNONAA 38 (L) 01/31/2014 07:25 PM   GFRAA 46 (L) 01/31/2014 07:25 PM   Current antihyperglycemic regimen:  No pharmacotherapy  What recent interventions/DTPs have been made to improve glycemic control:  No changes  Have there been any recent hospitalizations or ED visits since last visit with CPP? No  Patient denies hypoglycemic symptoms, including Pale, Sweaty, Shaky, Hungry, Nervous/irritable and Vision changes  Patient denies hyperglycemic symptoms, including blurry vision, excessive thirst, fatigue, polyuria and weakness  How often are you checking your blood sugar? 1-2 times a week  What are your blood sugars ranging?  Fasting:  05/29/20 -141 05/22/20- 167 Before meals: N/A After meals: N/A Bedtime: N/A  On insulin? No  During the week, how often does your blood glucose drop below 70? Never states lowest it gets is 120.  Are you checking your feet daily/regularly? Yes  Adherence Review: Is the patient currently on a STATIN medication? Yes Is the patient currently on ACE/ARB medication? No Does the patient have >5 day gap between last estimated fill dates? CPP to review  Lipid Panel    Component Value Date/Time   CHOL 156 02/18/2019 0944   CHOL 206 06/20/2014 0000   TRIG 194.0 (H) 02/18/2019 0944   TRIG 155 06/20/2014 0000   HDL 39.30 02/18/2019 0944   HDL 39 06/20/2014 0000   LDLCALC 77 02/18/2019 0944   LDLCALC 135 06/20/2014 0000   LDLDIRECT 81.0 02/16/2018 0924    10-year ASCVD risk score: The 10-year ASCVD risk score Mikey Bussing DC Brooke Bonito., et al., 2013) is: 33.2%   Values used to calculate the score:     Age: 80 years     Sex: Female     Is Non-Hispanic African American: No     Diabetic: Yes      Tobacco smoker: No     Systolic Blood Pressure: 591 mmHg     Is BP treated: No     HDL Cholesterol: 39.3 mg/dL     Total Cholesterol: 156 mg/dL  Current antihyperlipidemic regimen:  Pravastatin 80 mg daily Aspirin EC 81 mg daily Zetia 10 mg daily Nitroglycerin 0.4 mcg every 5 minutes prn  Previous antihyperlipidemic medications tried: niacin, rosuvastatin  ASCVD risk enhancing conditions: age >34, DM and CKD   What recent interventions/DTPs have been made by any provider to improve Cholesterol control since last CPP Visit: No recent interventions  Any recent hospitalizations or ED visits since last visit with CPP? No   What diet changes have been made to improve Cholesterol?  No changes to diet  What exercise is being done to improve Cholesterol?  States she has not been exercising. She does have a gym where she lives but has not gone since it has been so cold. Patient states she recently bought a carriage for  her cat and is excited for warmer days so she can take her cat on walks.    Scheduled patient for follow up with Debbora Dus, Pharm. D. Patient was reminded to have all medications, supplements and any blood glucose readings available for review with Debbora Dus, Pharm. D, at her telephone visit on 07/31/20 at 9:30.   Follow-Up:  Pharmacist Review and Scheduled Follow-Up With Clinical Pharmacist  Debbora Dus, CPP notified  Margaretmary Dys, [place] [phone]  I have reviewed the care management and care coordination activities outlined in this encounter and I am certifying that I agree with the content of this note. No further action required. Patient has follow up with PCP March 2022.  Debbora Dus, PharmD Clinical Pharmacist [facility] [phone]

## 2020-05-29 ENCOUNTER — Other Ambulatory Visit: Payer: Self-pay

## 2020-05-29 ENCOUNTER — Ambulatory Visit (INDEPENDENT_AMBULATORY_CARE_PROVIDER_SITE_OTHER): Payer: PPO | Admitting: Family Medicine

## 2020-05-29 ENCOUNTER — Encounter: Payer: Self-pay | Admitting: Family Medicine

## 2020-05-29 DIAGNOSIS — F332 Major depressive disorder, recurrent severe without psychotic features: Secondary | ICD-10-CM

## 2020-05-29 MED ORDER — VENLAFAXINE HCL ER 150 MG PO CP24: 150.0000 mg | ORAL_CAPSULE | Freq: Every day | ORAL | 1 refills | Status: DC

## 2020-05-29 NOTE — Assessment & Plan Note (Signed)
Improvement noted over the past 2 weeks.  Overall doing well on current regimen.  Will completely discontinue celexa (plan was to taper off and remain on effexor and remeron).  She does have upcoming counseling appt next week and psychiatry appt for 07/2020.  Ongoing support provided. Reassess at CPE next month.

## 2020-05-29 NOTE — Progress Notes (Signed)
Patient ID: Kathleen Williamson, female    DOB: 27-May-1940, 80 y.o.   MRN: [id number]  This visit was conducted in person.  BP 134/78   Pulse 82   Temp (!) 97.5 F (36.4 C) (Temporal)   Ht 5' 2.5" (1.588 m)   Wt 152 lb 6 oz (69.1 kg)   LMP  (LMP Unknown)   SpO2 98%   BMI 27.43 kg/m    CC: 2 wk f/u visit depression Subjective:   HPI: Kathleen Williamson is a 80 y.o. female presenting on 05/29/2020 for Depression (Here for 2 wk f/u.)   See prior note for details.  Acute worsening depression with SI with plan, no SA. Situational, ?seasonal.   We changed regimen to celexa 20mg  daily 1/2 tab daily, increased effexor XR to 150mg  daily, continued remeron 15mg  daily and continued klonopin 0.5mg  BID. Plan was to eventually taper off SSRI, although she had previously tolerated SSRI/SNRI without serotonergic side effects.   She notes significant sedation with increased effexor dose.  She is overall feeling much better today.  She was able to spend quality time with her friend Judson Roch yesterday.   Sertraline may have caused microscopic colitis.   Scheduled to see Otila Kluver counselor with [facility] on 2/23 and Dr Nicolasa Ducking 4/20.      Relevant past medical, surgical, family and social history reviewed and updated as indicated. Interim medical history since our last visit reviewed. Allergies and medications reviewed and updated. Outpatient Medications Prior to Visit  Medication Sig Dispense Refill  . Accu-Chek FastClix Lancets MISC USE AS INSTRUCTED TO CHECK BLOOD SUGAR ONCE DAILY 102 each 1  . ACCU-CHEK GUIDE test strip USE AS INSTRUCTED TO CHECK BLOOD SUGAR ONCE DAILY 100 strip 1  . aspirin EC 81 MG tablet Take 81 mg by mouth at bedtime.    . Blood Glucose Monitoring Suppl (ACCU-CHEK GUIDE) w/Device KIT 1 each by Does not apply route as directed. Use as instructed to check blood sugar once daily 1 kit 0  . clonazePAM (KLONOPIN) 0.5 MG tablet TAKE 1 TABLET BY MOUTH TWICE A DAY AS NEEDED 60  tablet 0  . Coenzyme Q10 (COQ10) 100 MG CAPS Take 1 capsule by mouth at bedtime.     Marland Kitchen ezetimibe (ZETIA) 10 MG tablet TAKE 1 TABLET BY MOUTH ONCE DAILY 90 tablet 3  . Glucosamine-Chondroitin (MOVE FREE PO) Take 2 tablets by mouth daily.    Marland Kitchen levothyroxine (SYNTHROID) 50 MCG tablet TAKE 1 TABLET BY MOUTH EVERY DAY 90 tablet 1  . loperamide (IMODIUM A-D) 2 MG tablet Take 2 mg by mouth as needed.     . mirtazapine (REMERON) 15 MG tablet TAKE 1 TABLET BY MOUTH EVERYDAY AT BEDTIME 90 tablet 0  . nitroGLYCERIN (NITROLINGUAL) 0.4 MG/SPRAY spray Place 1 spray under the tongue every 5 (five) minutes as needed. 12 g prn  . pravastatin (PRAVACHOL) 80 MG tablet TAKE 1 TABLET BY MOUTH EVERY DAY IN THE EVENING 90 tablet 3  . citalopram (CELEXA) 20 MG tablet Take 0.5 tablets (10 mg total) by mouth in the morning. 30 tablet 1  . venlafaxine XR (EFFEXOR-XR) 150 MG 24 hr capsule Take 1 capsule (150 mg total) by mouth daily with breakfast. 30 capsule 1  . Calcium Carbonate-Vitamin D 600-400 MG-UNIT tablet Take 1 tablet by mouth 2 (two) times daily. (Patient not taking: No sig reported)     No facility-administered medications prior to visit.     Per HPI unless specifically indicated in  ROS section below Review of Systems Objective:  BP 134/78   Pulse 82   Temp (!) 97.5 F (36.4 C) (Temporal)   Ht 5' 2.5" (1.588 m)   Wt 152 lb 6 oz (69.1 kg)   LMP  (LMP Unknown)   SpO2 98%   BMI 27.43 kg/m   Wt Readings from Last 3 Encounters:  05/29/20 152 lb 6 oz (69.1 kg)  05/09/20 154 lb 3 oz (69.9 kg)  09/16/19 147 lb 6 oz (66.8 kg)      Physical Exam Vitals and nursing note reviewed.  Constitutional:      Appearance: Normal appearance. She is not ill-appearing.  Cardiovascular:     Rate and Rhythm: Normal rate and regular rhythm.     Pulses: Normal pulses.     Heart sounds: Normal heart sounds. No murmur heard.   Pulmonary:     Effort: Pulmonary effort is normal. No respiratory distress.     Breath  sounds: Normal breath sounds. No wheezing, rhonchi or rales.  Musculoskeletal:     Right lower leg: No edema.     Left lower leg: No edema.  Neurological:     Mental Status: She is alert.  Psychiatric:        Mood and Affect: Mood normal.        Behavior: Behavior normal.       Results for orders placed or performed in visit on 05/23/20  HM DIABETES EYE EXAM  Result Value Ref Range   HM Diabetic Eye Exam No Retinopathy No Retinopathy   Depression screen [facility] 2/9 05/29/2020 05/09/2020 09/16/2019 06/14/2019 05/13/2019  Decreased Interest 1 1 0 1 3  Down, Depressed, Hopeless 1 3 0 1 3  PHQ - 2 Score 2 4 0 2 6  Altered sleeping 1 1 0 2 1  Tired, decreased energy 1 1 0 1 3  Change in appetite 2 0 0 1 2  Feeling bad or failure about yourself  1 0 0 0 1  Trouble concentrating 0 0 0 0 1  Moving slowly or fidgety/restless 0 0 0 0 0  Suicidal thoughts 0 1 0 0 0  PHQ-9 Score 7 7 0 6 14  Difficult doing work/chores - - - - -  Some recent data might be hidden    GAD 7 : Generalized Anxiety Score 05/29/2020 05/09/2020 09/16/2019 06/14/2019  Nervous, Anxious, on Edge 1 2 0 0  Control/stop worrying 1 3 0 0  Worry too much - different things 1 3 0 0  Trouble relaxing 1 2 0 1  Restless 1 1 0 0  Easily annoyed or irritable 0 1 0 0  Afraid - awful might happen 0 0 0 0  Total GAD 7 Score 5 12 0 1   Assessment & Plan:  This visit occurred during the SARS-CoV-2 public health emergency.  Safety protocols were in place, including screening questions prior to the visit, additional usage of staff PPE, and extensive cleaning of exam room while observing appropriate contact time as indicated for disinfecting solutions.   Problem List Items Addressed This Visit    MDD (major depressive disorder), recurrent severe, without psychosis ([place])    Improvement noted over the past 2 weeks.  Overall doing well on current regimen.  Will completely discontinue celexa (plan was to taper off and remain on effexor and  remeron).  She does have upcoming counseling appt next week and psychiatry appt for 07/2020.  Ongoing support provided. Reassess at CPE next month.  Relevant Medications   venlafaxine XR (EFFEXOR-XR) 150 MG 24 hr capsule       Meds ordered this encounter  Medications  . venlafaxine XR (EFFEXOR-XR) 150 MG 24 hr capsule    Sig: Take 1 capsule (150 mg total) by mouth daily with breakfast.    Dispense:  90 capsule    Refill:  1    To replace celexa   No orders of the defined types were placed in this encounter.   Patient Instructions  I'm glad you're doing better! Let's stop celexa at this time. Continue other medicines of venlafaxine, mirtazapine, and klonopin.  I'm glad also you have upcoming appointments with counseling and psychiatry.   Follow up plan: Return if symptoms worsen or fail to improve.  [name], MD

## 2020-05-29 NOTE — Patient Instructions (Addendum)
I'm glad you're doing better! Let's stop celexa at this time. Continue other medicines of venlafaxine, mirtazapine, and klonopin.  I'm glad also you have upcoming appointments with counseling and psychiatry.

## 2020-06-06 DIAGNOSIS — F331 Major depressive disorder, recurrent, moderate: Secondary | ICD-10-CM | POA: Diagnosis not present

## 2020-06-06 DIAGNOSIS — F419 Anxiety disorder, unspecified: Secondary | ICD-10-CM | POA: Diagnosis not present

## 2020-06-11 ENCOUNTER — Other Ambulatory Visit: Payer: Self-pay | Admitting: Family Medicine

## 2020-06-11 DIAGNOSIS — E1169 Type 2 diabetes mellitus with other specified complication: Secondary | ICD-10-CM

## 2020-06-11 DIAGNOSIS — N1832 Chronic kidney disease, stage 3b: Secondary | ICD-10-CM

## 2020-06-11 DIAGNOSIS — E039 Hypothyroidism, unspecified: Secondary | ICD-10-CM

## 2020-06-11 DIAGNOSIS — F331 Major depressive disorder, recurrent, moderate: Secondary | ICD-10-CM

## 2020-06-11 DIAGNOSIS — E118 Type 2 diabetes mellitus with unspecified complications: Secondary | ICD-10-CM

## 2020-06-11 DIAGNOSIS — M810 Age-related osteoporosis without current pathological fracture: Secondary | ICD-10-CM

## 2020-06-11 DIAGNOSIS — Z1159 Encounter for screening for other viral diseases: Secondary | ICD-10-CM

## 2020-06-11 NOTE — Telephone Encounter (Signed)
Name of Medication: Clonazepam Name of Pharmacy: [facility] or Written Date and Quantity: 05/11/20 #60 Last Office Visit and Type: 05/29/20 Next Office Visit and Type: 06/18/20 Last Controlled Substance Agreement Date: 12/19/16 Last UDS:12/19/16

## 2020-06-12 ENCOUNTER — Telehealth: Payer: Self-pay

## 2020-06-12 ENCOUNTER — Other Ambulatory Visit: Payer: Self-pay

## 2020-06-12 ENCOUNTER — Ambulatory Visit: Payer: PPO

## 2020-06-12 NOTE — Telephone Encounter (Signed)
Called patient 3 times trying to complete her AWV. Patient never answered the phone. Left message on voicemail notifying patient I called and she can reschedule or provider might complete at her upcoming physical.

## 2020-06-13 ENCOUNTER — Other Ambulatory Visit: Payer: PPO

## 2020-06-13 NOTE — Telephone Encounter (Signed)
ERx 

## 2020-06-18 ENCOUNTER — Ambulatory Visit (INDEPENDENT_AMBULATORY_CARE_PROVIDER_SITE_OTHER): Payer: PPO | Admitting: Family Medicine

## 2020-06-18 ENCOUNTER — Other Ambulatory Visit: Payer: Self-pay

## 2020-06-18 ENCOUNTER — Encounter: Payer: Self-pay | Admitting: Family Medicine

## 2020-06-18 VITALS — BP 142/64 | HR 96 | Temp 97.7°F | Ht 62.25 in | Wt 150.2 lb

## 2020-06-18 DIAGNOSIS — I6502 Occlusion and stenosis of left vertebral artery: Secondary | ICD-10-CM | POA: Diagnosis not present

## 2020-06-18 DIAGNOSIS — E1169 Type 2 diabetes mellitus with other specified complication: Secondary | ICD-10-CM | POA: Diagnosis not present

## 2020-06-18 DIAGNOSIS — E785 Hyperlipidemia, unspecified: Secondary | ICD-10-CM | POA: Diagnosis not present

## 2020-06-18 DIAGNOSIS — H9191 Unspecified hearing loss, right ear: Secondary | ICD-10-CM

## 2020-06-18 DIAGNOSIS — Z1159 Encounter for screening for other viral diseases: Secondary | ICD-10-CM

## 2020-06-18 DIAGNOSIS — F332 Major depressive disorder, recurrent severe without psychotic features: Secondary | ICD-10-CM

## 2020-06-18 DIAGNOSIS — Z7189 Other specified counseling: Secondary | ICD-10-CM

## 2020-06-18 DIAGNOSIS — I6523 Occlusion and stenosis of bilateral carotid arteries: Secondary | ICD-10-CM

## 2020-06-18 DIAGNOSIS — M1611 Unilateral primary osteoarthritis, right hip: Secondary | ICD-10-CM

## 2020-06-18 DIAGNOSIS — Z0000 Encounter for general adult medical examination without abnormal findings: Secondary | ICD-10-CM | POA: Diagnosis not present

## 2020-06-18 DIAGNOSIS — E118 Type 2 diabetes mellitus with unspecified complications: Secondary | ICD-10-CM | POA: Diagnosis not present

## 2020-06-18 DIAGNOSIS — G458 Other transient cerebral ischemic attacks and related syndromes: Secondary | ICD-10-CM | POA: Diagnosis not present

## 2020-06-18 DIAGNOSIS — E039 Hypothyroidism, unspecified: Secondary | ICD-10-CM | POA: Diagnosis not present

## 2020-06-18 DIAGNOSIS — N1832 Chronic kidney disease, stage 3b: Secondary | ICD-10-CM | POA: Diagnosis not present

## 2020-06-18 DIAGNOSIS — M810 Age-related osteoporosis without current pathological fracture: Secondary | ICD-10-CM

## 2020-06-18 LAB — MICROALBUMIN / CREATININE URINE RATIO
Creatinine,U: 86.6 mg/dL
Microalb Creat Ratio: 6.6 mg/g (ref 0.0–30.0)
Microalb, Ur: 5.7 mg/dL — ABNORMAL HIGH (ref 0.0–1.9)

## 2020-06-18 LAB — COMPREHENSIVE METABOLIC PANEL
ALT: 13 U/L (ref 0–35)
AST: 12 U/L (ref 0–37)
Albumin: 4 g/dL (ref 3.5–5.2)
Alkaline Phosphatase: 69 U/L (ref 39–117)
BUN: 21 mg/dL (ref 6–23)
CO2: 26 mEq/L (ref 19–32)
Calcium: 9.8 mg/dL (ref 8.4–10.5)
Chloride: 105 mEq/L (ref 96–112)
Creatinine, Ser: 1.53 mg/dL — ABNORMAL HIGH (ref 0.40–1.20)
GFR: 32.4 mL/min — ABNORMAL LOW (ref >60.00)
Glucose, Bld: 125 mg/dL — ABNORMAL HIGH (ref 70–99)
Potassium: 4.7 mEq/L (ref 3.5–5.1)
Sodium: 140 mEq/L (ref 135–145)
Total Bilirubin: 0.4 mg/dL (ref 0.2–1.2)
Total Protein: 6.5 g/dL (ref 6.0–8.3)

## 2020-06-18 LAB — CBC WITH DIFFERENTIAL/PLATELET
Basophils Absolute: 0.1 10*3/uL (ref 0.0–0.1)
Basophils Relative: 0.9 % (ref 0.0–3.0)
Eosinophils Absolute: 0.2 10*3/uL (ref 0.0–0.7)
Eosinophils Relative: 2.1 % (ref 0.0–5.0)
HCT: 36.3 % (ref 36.0–46.0)
Hemoglobin: 12.2 g/dL (ref 12.0–15.0)
Lymphocytes Relative: 22.2 % (ref 12.0–46.0)
Lymphs Abs: 1.8 10*3/uL (ref 0.7–4.0)
MCHC: 33.5 g/dL (ref 30.0–36.0)
MCV: 88.3 fl (ref 78.0–100.0)
Monocytes Absolute: 0.5 10*3/uL (ref 0.1–1.0)
Monocytes Relative: 6.4 % (ref 3.0–12.0)
Neutro Abs: 5.6 10*3/uL (ref 1.4–7.7)
Neutrophils Relative %: 68.4 % (ref 43.0–77.0)
Platelets: 270 10*3/uL (ref 150.0–400.0)
RBC: 4.11 Mil/uL (ref 3.87–5.11)
RDW: 13.8 % (ref 11.5–15.5)
WBC: 8.2 10*3/uL (ref 4.0–10.5)

## 2020-06-18 LAB — VITAMIN D 25 HYDROXY (VIT D DEFICIENCY, FRACTURES): VITD: 29.07 ng/mL — ABNORMAL LOW (ref 30.00–100.00)

## 2020-06-18 LAB — LDL CHOLESTEROL, DIRECT: Direct LDL: 77 mg/dL

## 2020-06-18 LAB — HEMOGLOBIN A1C: Hgb A1c MFr Bld: 7.6 % — ABNORMAL HIGH (ref 4.6–6.5)

## 2020-06-18 LAB — TSH: TSH: 0.96 u[IU]/mL (ref 0.35–4.50)

## 2020-06-18 MED ORDER — LEVOTHYROXINE SODIUM 50 MCG PO TABS: 50.0000 ug | ORAL_TABLET | Freq: Every day | ORAL | 3 refills | Status: DC

## 2020-06-18 MED ORDER — MIRTAZAPINE 30 MG PO TABS: 30.0000 mg | ORAL_TABLET | Freq: Every day | ORAL | 3 refills | Status: DC

## 2020-06-18 NOTE — Assessment & Plan Note (Signed)
Update carotid US.  

## 2020-06-18 NOTE — Assessment & Plan Note (Signed)
Preventative protocols reviewed and updated unless pt declined. Discussed healthy diet and lifestyle.  

## 2020-06-18 NOTE — Assessment & Plan Note (Signed)
Has established with counselor, has upcoming psychiatry appt Nicolasa Ducking 07/2020). Notes sleeping well but ongoing depression and decreased appetite - will increase mirtazapine to 30mg  nightly. RTC 3-4 mo f/u visit. Update PHQ9/GAD7 today.

## 2020-06-18 NOTE — Progress Notes (Signed)
Patient ID: Kathleen Williamson, female    DOB: 12/12/1940, 80 y.o.   MRN: [id number]  This visit was conducted in person.  BP (!) 142/64   Pulse 96   Temp 97.7 F (36.5 C) (Temporal)   Ht 5' 2.25" (1.581 m)   Wt 150 lb 3 oz (68.1 kg)   LMP  (LMP Unknown)   SpO2 97%   BMI 27.25 kg/m    CC: CPE Subjective:   HPI: Kathleen Williamson is a 80 y.o. female presenting on 06/18/2020 for Medicare Wellness (Wants to discuss venlafaxine. )   Did not see health advisor this year.    Hearing Screening   '125Hz'$  $Remo'250Hz'KNomS$'500Hz'$'1000Hz'$'2000Hz'$'3000Hz'$'4000Hz'$'6000Hz'$'8000Hz'$   Right ear:   40 0 0  40    Left ear:   40 40 40  40    Vision Screening Comments: Last eye exam, 03/2020.  [place] Office Visit from 06/18/2020 in [place] at [facility] Total Score 1    Failed R hearing screen - she notes trouble with this. Would agree audiology referral at this time.  Fall Risk  06/18/2020 02/22/2019 02/16/2018 06/29/2017 06/22/2017  Falls in the past year? 1 0 1 No (No Data)  Comment - - fell after tripping over broken concrete on sidewalk; loss of consciousness and head trauma - no falls since last visit  Number falls in past yr: 1 - 0 - -  Injury with Fall? 0 - 1 - -  2 falls in the snow, did hit head but fortunately not major injury, was able to get up.   MDD - see prior recent notes for details. Current regimen is klonopin 0.$RemoveBefor'5mg'ByXLBMLIguDM$  bid, remeron $RemoveBefo'15mg'MGcEaxbaKTm$  nightly, and effexor $RemoveBef'150mg'LPPmdOzMQe$  daily. She has established with counselor Otila Kluver at [facility]. Pending appt with psychiatrist Dr Nicolasa Ducking 08/01/2020.   She is not fasting today.   Chronic R hip pain - seeing Dr Wynelle Link, s/p hip steroid injection with good effect. Last seen 02/2020.   Preventative: Colonoscopy 05/23/2014 microscopic colitis attributed to zoloft (Dr Wynetta Emery at [facility])  Breast cancer screening - 06/2019 WNL [facility]) Well woman with pap 03/2014 ([place]) - will not return. S/p complete hysterectomy with B oophorectomy for endometriosis.   Lung cancer screening - never smoker  DEXA scan - 01/2015 -2.9 hip, -2.2 spine. Does not want bisphosphonate.She has not been on treatment for this. She does take calcium and vitamin D regularly.Dexa 2019 - T score -2.6 (improvement). Prolia started 08/2017.  DEXA 06/2019: T score -2.7 L hip, -2.1 spine.  Flu shotyearly  COVID vaccine Moderna 04/2019, 05/2019, booster 02/2020  Td - 2008, Tdap 2019 Pneumovax2008, prevnar 2017 zostavax- 2011 shingrix - 02/2017, 04/2017 Advanced directive discussion -has completed throughattorney. Will bring [name] copy. Very difficult to complete since husband's passing. Thinking of getting [place] involved.  Seat belt use discussed  Sunscreen use discussed, no changing moles on skin  Non smoker Alcohol - none Dentist q6 mo  Eye exam yearly  Bowel - no constipation  Bladder - some urge incontinence   Lives alone at [facility]. Husband of 20 yrs died suddenly 2013-09-26.  No children, no siblings.  Close friends Bethena Roys and Ace Gins nearby, cousin in [place]. Activity: going [place] Diet: poor - lots of frozen dinners, eating out      Relevant past medical, surgical, family and social history reviewed and updated as indicated. Interim medical history since our last visit reviewed. Allergies  and medications reviewed and updated. Outpatient Medications Prior to Visit  Medication Sig Dispense Refill  . Accu-Chek FastClix Lancets MISC USE AS INSTRUCTED TO CHECK BLOOD SUGAR ONCE DAILY 102 each 1  . ACCU-CHEK GUIDE test strip USE AS INSTRUCTED TO CHECK BLOOD SUGAR ONCE DAILY 100 strip 1  . aspirin EC 81 MG tablet Take 81 mg by mouth at bedtime.    . Blood Glucose Monitoring Suppl (ACCU-CHEK GUIDE) w/Device KIT 1 each by Does not apply route as directed. Use as instructed to check blood sugar once daily 1 kit 0  . Calcium Carbonate-Vitamin D 600-400 MG-UNIT tablet Take 1 tablet by mouth 2 (two) times daily.    . clonazePAM (KLONOPIN) 0.5  MG tablet TAKE 1 TABLET BY MOUTH TWICE A DAY AS NEEDED 60 tablet 0  . Coenzyme Q10 (COQ10) 100 MG CAPS Take 1 capsule by mouth at bedtime.     Marland Kitchen ezetimibe (ZETIA) 10 MG tablet TAKE 1 TABLET BY MOUTH ONCE DAILY 90 tablet 3  . Glucosamine-Chondroitin (MOVE FREE PO) Take 2 tablets by mouth daily.    Marland Kitchen loperamide (IMODIUM A-D) 2 MG tablet Take 2 mg by mouth as needed.     . nitroGLYCERIN (NITROLINGUAL) 0.4 MG/SPRAY spray Place 1 spray under the tongue every 5 (five) minutes as needed. 12 g prn  . pravastatin (PRAVACHOL) 80 MG tablet TAKE 1 TABLET BY MOUTH EVERY DAY IN THE EVENING 90 tablet 3  . venlafaxine XR (EFFEXOR-XR) 150 MG 24 hr capsule Take 1 capsule (150 mg total) by mouth daily with breakfast. 90 capsule 1  . levothyroxine (SYNTHROID) 50 MCG tablet TAKE 1 TABLET BY MOUTH EVERY DAY 90 tablet 1  . mirtazapine (REMERON) 15 MG tablet TAKE 1 TABLET BY MOUTH EVERYDAY AT BEDTIME 90 tablet 0   No facility-administered medications prior to visit.     Per HPI unless specifically indicated in ROS section below Review of Systems  Constitutional: Negative for activity change, appetite change, chills, fatigue, fever and unexpected weight change.  HENT: Negative for hearing loss.   Eyes: Negative for visual disturbance.  Respiratory: Negative for cough, chest tightness, shortness of breath and wheezing.   Cardiovascular: Negative for chest pain, palpitations and leg swelling.  Gastrointestinal: Negative for abdominal distention, abdominal pain, blood in stool, constipation, diarrhea, nausea and vomiting.  Genitourinary: Negative for difficulty urinating and hematuria.  Musculoskeletal: Negative for arthralgias, myalgias and neck pain.  Skin: Negative for rash.  Neurological: Negative for dizziness, seizures, syncope and headaches.  Hematological: Negative for adenopathy. Does not bruise/bleed easily.  Psychiatric/Behavioral: Positive for dysphoric mood. The patient is not nervous/anxious.     Objective:  BP (!) 142/64   Pulse 96   Temp 97.7 F (36.5 C) (Temporal)   Ht 5' 2.25" (1.581 m)   Wt 150 lb 3 oz (68.1 kg)   LMP  (LMP Unknown)   SpO2 97%   BMI 27.25 kg/m   Wt Readings from Last 3 Encounters:  06/18/20 150 lb 3 oz (68.1 kg)  05/29/20 152 lb 6 oz (69.1 kg)  05/09/20 154 lb 3 oz (69.9 kg)      Physical Exam Vitals and nursing note reviewed.  Constitutional:      General: She is not in acute distress.    Appearance: Normal appearance. She is well-developed and well-nourished. She is not ill-appearing.  HENT:     Head: Normocephalic and atraumatic.     Right Ear: Hearing, tympanic membrane, ear canal and external ear normal.  Left Ear: Hearing, tympanic membrane, ear canal and external ear normal.     Mouth/Throat:     Mouth: Oropharynx is clear and moist and mucous membranes are normal.     Pharynx: No posterior oropharyngeal edema.  Eyes:     General: No scleral icterus.    Extraocular Movements: Extraocular movements intact and EOM normal.     Conjunctiva/sclera: Conjunctivae normal.     Pupils: Pupils are equal, round, and reactive to light.  Neck:     Thyroid: No thyroid mass or thyromegaly.     Vascular: No carotid bruit.  Cardiovascular:     Rate and Rhythm: Normal rate and regular rhythm.     Pulses: Normal pulses and intact distal pulses.          Radial pulses are 2+ on the right side and 2+ on the left side.     Heart sounds: Normal heart sounds. No murmur heard.   Pulmonary:     Effort: Pulmonary effort is normal. No respiratory distress.     Breath sounds: Normal breath sounds. No wheezing, rhonchi or rales.  Abdominal:     General: Abdomen is flat. Bowel sounds are normal. There is no distension.     Palpations: Abdomen is soft. There is no mass.     Tenderness: There is no abdominal tenderness. There is no guarding or rebound.     Hernia: No hernia is present.  Musculoskeletal:        General: No edema. Normal range of motion.      Cervical back: Normal range of motion and neck supple.     Right lower leg: No edema.     Left lower leg: No edema.  Lymphadenopathy:     Cervical: No cervical adenopathy.  Skin:    General: Skin is warm and dry.     Findings: No rash.  Neurological:     General: No focal deficit present.     Mental Status: She is alert and oriented to person, place, and time.     Comments:  CN grossly intact, station and gait intact Recall 2/3, 2/3 with cue Calculation 5/5 serial 3s  Psychiatric:        Mood and Affect: Mood and affect and mood normal.        Behavior: Behavior normal.        Thought Content: Thought content normal.        Judgment: Judgment normal.       Lab Results  Component Value Date   TSH 0.93 02/18/2019    Results for orders placed or performed in visit on 05/23/20  HM DIABETES EYE EXAM  Result Value Ref Range   HM Diabetic Eye Exam No Retinopathy No Retinopathy   Depression screen [facility] 2/9 06/18/2020 05/29/2020 05/09/2020 09/16/2019 06/14/2019  Decreased Interest 0 1 1 0 1  Down, Depressed, Hopeless 1 1 3  0 1  PHQ - 2 Score 1 2 4  0 2  Altered sleeping 0 1 1 0 2  Tired, decreased energy 0 1 1 0 1  Change in appetite 1 2 0 0 1  Feeling bad or failure about yourself  0 1 0 0 0  Trouble concentrating 0 0 0 0 0  Moving slowly or fidgety/restless 0 0 0 0 0  Suicidal thoughts 0 0 1 0 0  PHQ-9 Score 2 7 7  0 6  Difficult doing work/chores - - - - -  Some recent data might be hidden  GAD 7 : Generalized Anxiety Score 06/18/2020 05/29/2020 05/09/2020 09/16/2019  Nervous, Anxious, on Edge 1 1 2  0  Control/stop worrying 1 1 3  0  Worry too much - different things 0 1 3 0  Trouble relaxing 1 1 2  0  Restless 0 1 1 0  Easily annoyed or irritable 0 0 1 0  Afraid - awful might happen 0 0 0 0  Total GAD 7 Score 3 5 12  0   Assessment & Plan:  This visit occurred during the SARS-CoV-2 public health emergency.  Safety protocols were in place, including screening questions prior to  the visit, additional usage of staff PPE, and extensive cleaning of exam room while observing appropriate contact time as indicated for disinfecting solutions.   Problem List Items Addressed This Visit    Vertebral artery stenosis/occlusion    Update carotid US      Subclavian steal syndrome    BP bilateral arms equal.       Osteoporosis    Recent DEXA stable, continue prolia injections.       Osteoarthritis of right hip    Now sees Dr Wynelle Link. Appreciate ortho care.       Medicare annual wellness visit, subsequent - Primary    I have personally reviewed the Medicare Annual Wellness questionnaire and have noted 1. The patient's medical and social history 2. Their use of alcohol, tobacco or illicit drugs 3. Their current medications and supplements 4. The patient's functional ability including ADL's, fall risks, home safety risks and hearing or visual impairment. Cognitive function has been assessed and addressed as indicated.  5. Diet and physical activity 6. Evidence for depression or mood disorders The patients weight, height, BMI have been recorded in the chart. I have made referrals, counseling and provided education to the patient based on review of the above and I have provided the pt with a written personalized care plan for preventive services. Provider list updated.. See scanned questionairre as needed for further documentation. Reviewed preventative protocols and updated unless pt declined.       MDD (major depressive disorder), recurrent severe, without psychosis ([place])    Has established with counselor, has upcoming psychiatry appt Nicolasa Ducking 07/2020). Notes sleeping well but ongoing depression and decreased appetite - will increase mirtazapine to 30mg  nightly. RTC 3-4 mo f/u visit. Update PHQ9/GAD7 today.       Relevant Medications   mirtazapine (REMERON) 30 MG tablet   Hypothyroidism    Continue low dose levothyroxine. Check TSH.      Relevant Medications    levothyroxine (SYNTHROID) 50 MCG tablet   Hyperlipidemia associated with type 2 diabetes mellitus (HCC)    Chronic, stable on pravastatin and ezetimibe.  Not fasting today - will just check dLDL.  The 10-year ASCVD risk score Mikey Bussing DC Brooke Bonito., et al., 2013) is: 41.1%   Values used to calculate the score:     Age: 89 years     Sex: Female     Is Non-Hispanic African American: No     Diabetic: Yes     Tobacco smoker: No     Systolic Blood Pressure: 323 mmHg     Is BP treated: No     HDL Cholesterol: 39.3 mg/dL     Total Cholesterol: 156 mg/dL       Relevant Orders   LDL Cholesterol, Direct   Health maintenance examination    Preventative protocols reviewed and updated unless pt declined. Discussed healthy diet and lifestyle.  Diabetes mellitus type 2, controlled, with complications ([place])    Update A1c. Diet controlled.       CKD (chronic kidney disease) stage 3, GFR 30-59 ml/min (HCC)    Update labs.       Carotid stenosis    No bruit appreciated today.  Update [name].       Relevant Orders   VAS US CAROTID   Advanced care planning/counseling discussion    Has completed at home. Asked to bring [name] copy.        Other Visit Diagnoses    Need for hepatitis C screening test       Hearing loss of right ear, unspecified hearing loss type       Relevant Orders   Ambulatory referral to Audiology       Meds ordered this encounter  Medications  . mirtazapine (REMERON) 30 MG tablet    Sig: Take 1 tablet (30 mg total) by mouth at bedtime.    Dispense:  90 tablet    Refill:  3    Note new dose  . levothyroxine (SYNTHROID) 50 MCG tablet    Sig: Take 1 tablet (50 mcg total) by mouth daily.    Dispense:  90 tablet    Refill:  3   Orders Placed This Encounter  Procedures  . LDL Cholesterol, Direct  . Ambulatory referral to Audiology    Referral Priority:   Routine    Referral Type:   Audiology Exam    Referral Reason:   Specialty Services Required    Number of Visits  Requested:   1    Patient instructions: Labs today  We will refer you to audiologist.  Bring [name] a copy of your advanced directive.  Call to schedule mammogram at your convenience: [facility] in [place] [phone] Increase remeron (mirtazapine) to $RemoveBeforeDE'30mg'nnnsfnGFEPgWgNs$  nightly - this may help mood and appetite.  Return as needed or in 3-4 months for follow up visit.   Follow up plan: Return in about 4 months (around 10/18/2020) for follow up visit.  [name], MD

## 2020-06-18 NOTE — Assessment & Plan Note (Signed)
Update A1c. Diet controlled.

## 2020-06-18 NOTE — Assessment & Plan Note (Signed)
Update labs.  

## 2020-06-18 NOTE — Assessment & Plan Note (Signed)
Chronic, stable on pravastatin and ezetimibe.  Not fasting today - will just check dLDL.  The 10-year ASCVD risk score Mikey Bussing DC Brooke Bonito., et al., 2013) is: 41.1%   Values used to calculate the score:     Age: 80 years     Sex: Female     Is Non-Hispanic African American: No     Diabetic: Yes     Tobacco smoker: No     Systolic Blood Pressure: 824 mmHg     Is BP treated: No     HDL Cholesterol: 39.3 mg/dL     Total Cholesterol: 156 mg/dL

## 2020-06-18 NOTE — Assessment & Plan Note (Signed)
No bruit appreciated today.  Update [name].

## 2020-06-18 NOTE — Patient Instructions (Addendum)
Labs today  We will refer you to audiologist.  Bring [name] a copy of your advanced directive.  Call to schedule mammogram at your convenience: [facility] in [place] [phone] Increase remeron (mirtazapine) to 30mg  nightly - this may help mood and appetite.  Return as needed or in 3-4 months for follow up visit.   Health Maintenance After Age 79 After age 29, you are at a higher risk for certain long-term diseases and infections as well as injuries from falls. Falls are a major cause of broken bones and head injuries in people who are older than age 65. Getting regular preventive care can help to keep you healthy and well. Preventive care includes getting regular testing and making lifestyle changes as recommended by your health care provider. Talk with your health care provider about:  Which screenings and tests you should have. A screening is a test that checks for a disease when you have no symptoms.  A diet and exercise plan that is right for you. What should I know about screenings and tests to prevent falls? Screening and testing are the best ways to find a health problem early. Early diagnosis and treatment give you the best chance of managing medical conditions that are common after age 67. Certain conditions and lifestyle choices may make you more likely to have a fall. Your health care provider may recommend:  Regular vision checks. Poor vision and conditions such as cataracts can make you more likely to have a fall. If you wear glasses, make sure to get your prescription updated if your vision changes.  Medicine review. Work with your health care provider to regularly review all of the medicines you are taking, including over-the-counter medicines. Ask your health care provider about any side effects that may make you more likely to have a fall. Tell your health care provider if any medicines that you take make you feel dizzy or sleepy.  Osteoporosis screening. Osteoporosis  is a condition that causes the bones to get weaker. This can make the bones weak and cause them to break more easily.  Blood pressure screening. Blood pressure changes and medicines to control blood pressure can make you feel dizzy.  Strength and balance checks. Your health care provider may recommend certain tests to check your strength and balance while standing, walking, or changing positions.  Foot health exam. Foot pain and numbness, as well as not wearing proper footwear, can make you more likely to have a fall.  Depression screening. You may be more likely to have a fall if you have a fear of falling, feel emotionally low, or feel unable to do activities that you used to do.  Alcohol use screening. Using too much alcohol can affect your balance and may make you more likely to have a fall. What actions can I take to lower my risk of falls? General instructions  Talk with your health care provider about your risks for falling. Tell your health care provider if: ? You fall. Be sure to tell your health care provider about all falls, even ones that seem minor. ? You feel dizzy, sleepy, or off-balance.  Take over-the-counter and prescription medicines only as told by your health care provider. These include any supplements.  Eat a healthy diet and maintain a healthy weight. A healthy diet includes low-fat dairy products, low-fat (lean) meats, and fiber from whole grains, beans, and lots of fruits and vegetables. Home safety  Remove any tripping hazards, such as rugs, cords, and clutter.  Install safety equipment such as grab bars in bathrooms and safety rails on stairs.  Keep rooms and walkways well-lit. Activity  Follow a regular exercise program to stay fit. This will help you maintain your balance. Ask your health care provider what types of exercise are appropriate for you.  If you need a cane or walker, use it as recommended by your health care provider.  Wear supportive shoes  that have nonskid soles.   Lifestyle  Do not drink alcohol if your health care provider tells you not to drink.  If you drink alcohol, limit how much you have: ? 0-1 drink a day for women. ? 0-2 drinks a day for men.  Be aware of how much alcohol is in your drink. In the [place] one drink equals one typical bottle of beer (12 oz), one-half glass of wine (5 oz), or one shot of hard liquor (1 oz).  Do not use any products that contain nicotine or tobacco, such as cigarettes and e-cigarettes. If you need help quitting, ask your health care provider. Summary  Having a healthy lifestyle and getting preventive care can help to protect your health and wellness after age 61.  Screening and testing are the best way to find a health problem early and help you avoid having a fall. Early diagnosis and treatment give you the best chance for managing medical conditions that are more common for people who are older than age 29.  Falls are a major cause of broken bones and head injuries in people who are older than age 2. Take precautions to prevent a fall at home.  Work with your health care provider to learn what changes you can make to improve your health and wellness and to prevent falls. This information is not intended to replace advice given to you by your health care provider. Make sure you discuss any questions you have with your health care provider. Document Revised: 07/22/2018 Document Reviewed: 02/11/2017 Elsevier Patient Education  2021 [employer].

## 2020-06-18 NOTE — Assessment & Plan Note (Signed)
Recent DEXA stable, continue prolia injections.

## 2020-06-18 NOTE — Assessment & Plan Note (Addendum)
Continue low dose levothyroxine. Check TSH.

## 2020-06-18 NOTE — Assessment & Plan Note (Signed)
BP bilateral arms equal.

## 2020-06-18 NOTE — Assessment & Plan Note (Signed)
Now sees Dr Wynelle Link. Appreciate ortho care.

## 2020-06-18 NOTE — Assessment & Plan Note (Signed)
Has completed at home. Asked to bring [name] copy.

## 2020-06-18 NOTE — Assessment & Plan Note (Signed)

## 2020-06-19 ENCOUNTER — Telehealth: Payer: Self-pay

## 2020-06-19 LAB — HEPATITIS C ANTIBODY
Hepatitis C Ab: NONREACTIVE
SIGNAL TO CUT-OFF: 0.07 (ref <1.00)

## 2020-06-19 NOTE — Telephone Encounter (Signed)
Contacted patient to let her know that the Prolia injection would be $280 OOP. Patient stated that she wanted to wait a couple of days until she gets her finances sorted. Patient stated that she will call back to schedule appointments needed for Prolia injection.

## 2020-06-24 ENCOUNTER — Other Ambulatory Visit: Payer: Self-pay | Admitting: Family Medicine

## 2020-06-24 MED ORDER — RYBELSUS 3 MG PO TABS: 3.0000 mg | ORAL_TABLET | Freq: Every day | ORAL | 6 refills | Status: DC

## 2020-06-25 ENCOUNTER — Other Ambulatory Visit: Payer: Self-pay | Admitting: Family Medicine

## 2020-06-25 ENCOUNTER — Other Ambulatory Visit: Payer: Self-pay

## 2020-06-25 ENCOUNTER — Ambulatory Visit
Admission: RE | Admit: 2020-06-25 | Discharge: 2020-06-25 | Disposition: A | Discharge status: Home / Self Care | Payer: PPO | Source: Ambulatory Visit | Attending: Family Medicine | Admitting: Family Medicine

## 2020-06-25 DIAGNOSIS — I6523 Occlusion and stenosis of bilateral carotid arteries: Secondary | ICD-10-CM

## 2020-06-25 DIAGNOSIS — R55 Syncope and collapse: Secondary | ICD-10-CM | POA: Insufficient documentation

## 2020-06-25 DIAGNOSIS — I6502 Occlusion and stenosis of left vertebral artery: Secondary | ICD-10-CM | POA: Diagnosis not present

## 2020-06-25 DIAGNOSIS — F419 Anxiety disorder, unspecified: Secondary | ICD-10-CM | POA: Diagnosis not present

## 2020-06-25 DIAGNOSIS — F331 Major depressive disorder, recurrent, moderate: Secondary | ICD-10-CM | POA: Diagnosis not present

## 2020-07-04 DIAGNOSIS — H903 Sensorineural hearing loss, bilateral: Secondary | ICD-10-CM | POA: Diagnosis not present

## 2020-07-06 NOTE — Telephone Encounter (Signed)
Called and LVM for patient to return call to office.  

## 2020-07-06 NOTE — Telephone Encounter (Signed)
Labs were obtained on 3/7 and NV scheduled for 4/5. Patient agreeable to pay $285 OOP.

## 2020-07-07 ENCOUNTER — Other Ambulatory Visit: Payer: Self-pay | Admitting: Cardiology

## 2020-07-07 ENCOUNTER — Encounter: Payer: Self-pay | Admitting: Family Medicine

## 2020-07-07 DIAGNOSIS — H903 Sensorineural hearing loss, bilateral: Secondary | ICD-10-CM | POA: Insufficient documentation

## 2020-07-11 NOTE — Telephone Encounter (Signed)
Ok to proceed with prolia.

## 2020-07-11 NOTE — Telephone Encounter (Signed)
Ca is normal and CrCl is 32.58 Sending msg to provider for approval to give prolia due to CrCl.

## 2020-07-12 ENCOUNTER — Telehealth: Payer: Self-pay

## 2020-07-12 ENCOUNTER — Other Ambulatory Visit: Payer: Self-pay | Admitting: Family Medicine

## 2020-07-12 ENCOUNTER — Other Ambulatory Visit: Payer: PPO

## 2020-07-12 DIAGNOSIS — F331 Major depressive disorder, recurrent, moderate: Secondary | ICD-10-CM

## 2020-07-12 NOTE — Telephone Encounter (Signed)
Pt called triage line asking to speak to [facility]. Started having diarrhea on Tuesday 3-29. Also feeling weak and sweaty. She took Imodium to stop the diarrhea. Pt fell asleep Tuesday night without taking her medications. When she woke up Wednesday morning her blood sugar was over 200.She is not taking Rybelsus as her med list states. She called EMT on [facility] campus to check her. He advised her to go to the ER but pt declined.  She felt better yesterday. Woke up this morning feeling weak and sweaty but no diarrhea.   Had a bite of food about 20 minutes of this message: blood sugar 162. Cannot check her blood pressure at home.    I suggested she go to the ER because she could be dehydrated. She said is drinking lots of water and will try to eat more today. I asked her that if she got worse would she go to the ER. She said she would call EMT on campus 1st.    Asking to see Dr Darnell Level tomorrow. Will forward to [facility] in Dr Synthia Innocent absence.

## 2020-07-12 NOTE — Telephone Encounter (Signed)
Ok to add pt tomorrow at 12:30?

## 2020-07-12 NOTE — Telephone Encounter (Signed)
Name of Medication: Clonazepam Name of Pharmacy: [facility] or Written Date and Quantity: 06/13/20, #60 Last Office Visit and Type: 06/18/20, AWV Next Office Visit and Type: 10/19/20, 3-4 mo f/u Last Controlled Substance Agreement Date: 12/19/16 Last UDS: 12/19/16

## 2020-07-12 NOTE — Telephone Encounter (Signed)
Ok to see tomorrow at 12:30.  Would ensure she has not missed effexor dose as that can cause bad  discontinuation symptoms.

## 2020-07-12 NOTE — Telephone Encounter (Signed)
Noted  

## 2020-07-13 NOTE — Telephone Encounter (Signed)
Spoke with pt asking if she has missed any Effexor doses.  Pt denies missing any.  Offered 12:30 OV today but declines stating she does not feel like going anywhere today and that the diarrhea is better.  She has not been bothered with it since last night. FYI to Dr. Darnell Level.

## 2020-07-13 NOTE — Telephone Encounter (Signed)
ERx 

## 2020-07-17 ENCOUNTER — Other Ambulatory Visit: Payer: Self-pay

## 2020-07-17 ENCOUNTER — Ambulatory Visit: Payer: PPO

## 2020-07-23 ENCOUNTER — Other Ambulatory Visit: Payer: Self-pay | Admitting: Family Medicine

## 2020-07-24 ENCOUNTER — Other Ambulatory Visit: Payer: Self-pay

## 2020-07-24 ENCOUNTER — Ambulatory Visit (INDEPENDENT_AMBULATORY_CARE_PROVIDER_SITE_OTHER): Payer: PPO

## 2020-07-24 DIAGNOSIS — M810 Age-related osteoporosis without current pathological fracture: Secondary | ICD-10-CM

## 2020-07-24 MED ORDER — DENOSUMAB 60 MG/ML ~~LOC~~ SOSY
60.0000 mg | PREFILLED_SYRINGE | Freq: Once | SUBCUTANEOUS | Status: AC
  Administered 2020-07-24: 60 mg via SUBCUTANEOUS

## 2020-07-24 NOTE — Progress Notes (Signed)
Per orders of Dr. Gutierrez, injection of Prolia, given by Sahaana Weitman G Kenyana Husak. Patient tolerated injection well.  

## 2020-07-26 ENCOUNTER — Other Ambulatory Visit: Payer: Self-pay | Admitting: Family Medicine

## 2020-07-26 DIAGNOSIS — M25551 Pain in right hip: Secondary | ICD-10-CM | POA: Diagnosis not present

## 2020-07-27 ENCOUNTER — Emergency Department
Admission: EM | Admit: 2020-07-27 | Discharge: 2020-07-28 | Disposition: A | Discharge status: Home / Self Care | Payer: PPO | Attending: Emergency Medicine | Admitting: Emergency Medicine

## 2020-07-27 DIAGNOSIS — Z20822 Contact with and (suspected) exposure to covid-19: Secondary | ICD-10-CM | POA: Diagnosis not present

## 2020-07-27 DIAGNOSIS — J9811 Atelectasis: Secondary | ICD-10-CM | POA: Diagnosis not present

## 2020-07-27 DIAGNOSIS — R6889 Other general symptoms and signs: Secondary | ICD-10-CM | POA: Diagnosis not present

## 2020-07-27 DIAGNOSIS — N183 Chronic kidney disease, stage 3 unspecified: Secondary | ICD-10-CM | POA: Diagnosis not present

## 2020-07-27 DIAGNOSIS — Z743 Need for continuous supervision: Secondary | ICD-10-CM | POA: Diagnosis not present

## 2020-07-27 DIAGNOSIS — E039 Hypothyroidism, unspecified: Secondary | ICD-10-CM | POA: Diagnosis not present

## 2020-07-27 DIAGNOSIS — Z951 Presence of aortocoronary bypass graft: Secondary | ICD-10-CM | POA: Diagnosis not present

## 2020-07-27 DIAGNOSIS — Z79899 Other long term (current) drug therapy: Secondary | ICD-10-CM | POA: Insufficient documentation

## 2020-07-27 DIAGNOSIS — R739 Hyperglycemia, unspecified: Secondary | ICD-10-CM

## 2020-07-27 DIAGNOSIS — I2510 Atherosclerotic heart disease of native coronary artery without angina pectoris: Secondary | ICD-10-CM | POA: Insufficient documentation

## 2020-07-27 DIAGNOSIS — R531 Weakness: Secondary | ICD-10-CM | POA: Diagnosis not present

## 2020-07-27 DIAGNOSIS — Q740 Other congenital malformations of upper limb(s), including shoulder girdle: Secondary | ICD-10-CM | POA: Diagnosis not present

## 2020-07-27 DIAGNOSIS — Z7982 Long term (current) use of aspirin: Secondary | ICD-10-CM | POA: Insufficient documentation

## 2020-07-27 DIAGNOSIS — M2578 Osteophyte, vertebrae: Secondary | ICD-10-CM | POA: Diagnosis not present

## 2020-07-27 DIAGNOSIS — E1165 Type 2 diabetes mellitus with hyperglycemia: Secondary | ICD-10-CM | POA: Insufficient documentation

## 2020-07-27 DIAGNOSIS — W19XXXA Unspecified fall, initial encounter: Secondary | ICD-10-CM | POA: Diagnosis not present

## 2020-07-27 DIAGNOSIS — R0902 Hypoxemia: Secondary | ICD-10-CM | POA: Diagnosis not present

## 2020-07-27 DIAGNOSIS — I6529 Occlusion and stenosis of unspecified carotid artery: Secondary | ICD-10-CM | POA: Diagnosis not present

## 2020-07-27 DIAGNOSIS — S0990XA Unspecified injury of head, initial encounter: Secondary | ICD-10-CM | POA: Insufficient documentation

## 2020-07-27 DIAGNOSIS — M47812 Spondylosis without myelopathy or radiculopathy, cervical region: Secondary | ICD-10-CM | POA: Diagnosis not present

## 2020-07-27 DIAGNOSIS — R9082 White matter disease, unspecified: Secondary | ICD-10-CM | POA: Diagnosis not present

## 2020-07-27 DIAGNOSIS — D72829 Elevated white blood cell count, unspecified: Secondary | ICD-10-CM | POA: Diagnosis not present

## 2020-07-27 MED ORDER — SODIUM CHLORIDE 0.9 % IV BOLUS (SEPSIS)
500.0000 mL | Freq: Once | INTRAVENOUS | Status: AC
  Administered 2020-07-28: 500 mL via INTRAVENOUS

## 2020-07-28 ENCOUNTER — Emergency Department: Payer: PPO

## 2020-07-28 DIAGNOSIS — M47812 Spondylosis without myelopathy or radiculopathy, cervical region: Secondary | ICD-10-CM | POA: Diagnosis not present

## 2020-07-28 DIAGNOSIS — E1165 Type 2 diabetes mellitus with hyperglycemia: Secondary | ICD-10-CM | POA: Diagnosis not present

## 2020-07-28 DIAGNOSIS — I6529 Occlusion and stenosis of unspecified carotid artery: Secondary | ICD-10-CM | POA: Diagnosis not present

## 2020-07-28 DIAGNOSIS — Q740 Other congenital malformations of upper limb(s), including shoulder girdle: Secondary | ICD-10-CM | POA: Diagnosis not present

## 2020-07-28 DIAGNOSIS — S0990XA Unspecified injury of head, initial encounter: Secondary | ICD-10-CM | POA: Diagnosis not present

## 2020-07-28 DIAGNOSIS — M2578 Osteophyte, vertebrae: Secondary | ICD-10-CM | POA: Diagnosis not present

## 2020-07-28 DIAGNOSIS — R531 Weakness: Secondary | ICD-10-CM | POA: Diagnosis not present

## 2020-07-28 DIAGNOSIS — J9811 Atelectasis: Secondary | ICD-10-CM | POA: Diagnosis not present

## 2020-07-28 DIAGNOSIS — I2510 Atherosclerotic heart disease of native coronary artery without angina pectoris: Secondary | ICD-10-CM | POA: Diagnosis not present

## 2020-07-28 DIAGNOSIS — D72829 Elevated white blood cell count, unspecified: Secondary | ICD-10-CM | POA: Diagnosis not present

## 2020-07-28 DIAGNOSIS — R9082 White matter disease, unspecified: Secondary | ICD-10-CM | POA: Diagnosis not present

## 2020-07-28 LAB — TROPONIN I (HIGH SENSITIVITY)
Troponin I (High Sensitivity): 6 ng/L (ref <18)
Troponin I (High Sensitivity): 7 ng/L (ref <18)

## 2020-07-28 LAB — BASIC METABOLIC PANEL
Anion gap: 8 (ref 5–15)
BUN: 29 mg/dL — ABNORMAL HIGH (ref 8–23)
CO2: 21 mmol/L — ABNORMAL LOW (ref 22–32)
Calcium: 7.4 mg/dL — ABNORMAL LOW (ref 8.9–10.3)
Chloride: 97 mmol/L — ABNORMAL LOW (ref 98–111)
Creatinine, Ser: 1.14 mg/dL — ABNORMAL HIGH (ref 0.44–1.00)
GFR, Estimated: 49 mL/min — ABNORMAL LOW (ref >60)
Glucose, Bld: 232 mg/dL — ABNORMAL HIGH (ref 70–99)
Potassium: 4.2 mmol/L (ref 3.5–5.1)
Sodium: 126 mmol/L — ABNORMAL LOW (ref 135–145)

## 2020-07-28 LAB — CBC WITH DIFFERENTIAL/PLATELET
Abs Immature Granulocytes: 0.08 10*3/uL — ABNORMAL HIGH (ref 0.00–0.07)
Basophils Absolute: 0 10*3/uL (ref 0.0–0.1)
Basophils Relative: 0 %
Eosinophils Absolute: 0 10*3/uL (ref 0.0–0.5)
Eosinophils Relative: 0 %
HCT: 29.9 % — ABNORMAL LOW (ref 36.0–46.0)
Hemoglobin: 10 g/dL — ABNORMAL LOW (ref 12.0–15.0)
Immature Granulocytes: 1 %
Lymphocytes Relative: 10 %
Lymphs Abs: 1.4 10*3/uL (ref 0.7–4.0)
MCH: 28.9 pg (ref 26.0–34.0)
MCHC: 33.4 g/dL (ref 30.0–36.0)
MCV: 86.4 fL (ref 80.0–100.0)
Monocytes Absolute: 0.7 10*3/uL (ref 0.1–1.0)
Monocytes Relative: 5 %
Neutro Abs: 11.4 10*3/uL — ABNORMAL HIGH (ref 1.7–7.7)
Neutrophils Relative %: 84 %
Platelets: 265 10*3/uL (ref 150–400)
RBC: 3.46 MIL/uL — ABNORMAL LOW (ref 3.87–5.11)
RDW: 13 % (ref 11.5–15.5)
WBC: 13.6 10*3/uL — ABNORMAL HIGH (ref 4.0–10.5)
nRBC: 0 % (ref 0.0–0.2)

## 2020-07-28 LAB — T4, FREE: Free T4: 0.85 ng/dL (ref 0.61–1.12)

## 2020-07-28 LAB — RESP PANEL BY RT-PCR (FLU A&B, COVID) ARPGX2
Influenza A by PCR: NEGATIVE
Influenza B by PCR: NEGATIVE
SARS Coronavirus 2 by RT PCR: NEGATIVE

## 2020-07-28 LAB — URINALYSIS, ROUTINE W REFLEX MICROSCOPIC
Bilirubin Urine: NEGATIVE
Glucose, UA: NEGATIVE mg/dL
Hgb urine dipstick: NEGATIVE
Ketones, ur: NEGATIVE mg/dL
Leukocytes,Ua: NEGATIVE
Nitrite: NEGATIVE
Protein, ur: NEGATIVE mg/dL
Specific Gravity, Urine: 1.002 — ABNORMAL LOW (ref 1.005–1.030)
pH: 7 (ref 5.0–8.0)

## 2020-07-28 LAB — CBG MONITORING, ED: Glucose-Capillary: 222 mg/dL — ABNORMAL HIGH (ref 70–99)

## 2020-07-28 LAB — TSH: TSH: 0.346 u[IU]/mL — ABNORMAL LOW (ref 0.350–4.500)

## 2020-07-28 NOTE — Discharge Instructions (Addendum)
Your work-up was unrevealing other than a slightly elevated white blood cell count (13,000), glucose (232) and a slightly low sodium (128) count.  Your TSH (0.346) was also low today.  I do recommend follow-up with your primary care physician in 1 to 2 weeks to have your lab work rechecked.  Your Covid and flu test were negative.  Your chest x-ray was clear.  Your urine showed no sign of infection.  CT of your head and cervical spine showed no injury from your recent fall.  You may take Tylenol 1000 mg every 6 hours as needed for the soreness in your neck.

## 2020-07-28 NOTE — ED Provider Notes (Addendum)
[facility] Emergency Department Provider Note  ____________________________________________   Event Date/Time   First MD Initiated Contact with Patient 07/27/20 2338     (approximate)  I have reviewed the triage vital signs and the nursing notes.   HISTORY  Chief Complaint Weakness (Pt arrives to ED from home (independent living) c/o generalized weakness x 2 wks, pt c/o glf 5 days ago, denies loc, c/o pain in neck. Pt also c/o hypergylcemia bs 315 by ems.)    HPI Kathleen Williamson is a 80 y.o. female with history of CAD status post CABG, chronic kidney disease, prediabetes, hyperlipidemia, hypothyroidism who presents to the emergency department with EMS from [facility] independent living facility with complaints of generalized weakness for 2 weeks.  States that she did have a fall last night where she thinks she slipped on something on the bathroom floor as her legs went out from underneath her and she hit her head on the wall.  No loss of consciousness.  Is complaining of soreness at the posterior lower neck.  No other back pain.  Not on blood thinners.  Denies numbness, tingling or focal weakness.  No recent fevers, cough, vomiting, diarrhea, chest pain, shortness of breath, bloody stools, melena.  States that she called out for assistance tonight because she felt too weak to even get to the bathroom.  She lives alone and normally ambulates without assistance.  When staff arrived to evaluate her they checked her blood sugar and it was 315.  She is not on any medications for diabetes.        Past Medical History:  Diagnosis Date  . Arthritis    fingers  . CAD (coronary artery disease) 2003   MI s/p 5v CABG  . Carotid stenosis    [place]  . CKD (chronic kidney disease) stage 3, GFR 30-59 ml/min (HCC)   . Dermatomyositis [facility])    saw Dr Estanislado Pandy, improved on its own  . Diabetes mellitus without complication ([place])   . Forehead laceration 09/15/2017  . History  of chicken pox   . History of measles   . History of migraine none since 2003  . HLD (hyperlipidemia)   . Hypothyroidism   . MDD (major depressive disorder), recurrent episode, moderate ([place])    h/o SI after lost husband unexpectedly 2015  . Microscopic colitis 05/2014   by colonoscopy, lymphocytic, zoloft related  . Osteoporosis    DEXA 03/2013 T -3.1, DEXA 10//2016 -2.9 hip, -2.2 spine  . Pancreatic cyst    Dr Ralene Ok, no f/u needed  . Prediabetes 2014  . Subclavian steal syndrome   . Urine incontinence     Patient Active Problem List   Diagnosis Date Noted  . Sensorineural hearing loss, bilateral 07/07/2020  . Osteoarthritis of right hip 09/17/2019  . Fear of falling 09/15/2017  . Tongue lesion 02/12/2017  . Osteoarthritis 12/19/2016  . Complicated grief 85/27/7824  . Health maintenance examination 02/07/2016  . Baker's cyst of knee 08/01/2015  . Diabetes mellitus type 2, controlled, with complications ([place]) 23/53/6144  . Subclavian steal syndrome   . Medicare annual wellness visit, subsequent 01/31/2015  . Advanced care planning/counseling discussion 01/31/2015  . Carotid stenosis 01/31/2015  . Vertebral artery stenosis/occlusion 01/31/2015  . CKD (chronic kidney disease) stage 3, GFR 30-59 ml/min (HCC) 01/21/2015  . Microscopic colitis 10/26/2014  . Osteoporosis   . Hypothyroidism   . Dermatomyositis ([place])   . MDD (major depressive disorder), recurrent severe, without psychosis ([place]) 01/12/2014  .  Atherosclerosis of native coronary artery of native heart without angina pectoris 12/13/2013  . Hyperlipidemia associated with type 2 diabetes mellitus ([place]) 12/13/2013  . Pseudocyst of pancreas 08/29/2011    Past Surgical History:  Procedure Laterality Date  . APPENDECTOMY  1997   with hysterectomy  . [place]   lower  . BREAST LUMPECTOMY Left 1977   benign  . COLONOSCOPY WITH PROPOFOL N/A 05/23/2014   microscopic colitis - Garlan Fair, MD  .  CORONARY ARTERY BYPASS GRAFT  2003   x 5 v  . ESOPHAGOGASTRODUODENOSCOPY (EGD) WITH PROPOFOL N/A 05/23/2014   WNL Garlan Fair, MD  . [place] Right 1990's  . TOOTH EXTRACTION  yrs ago  . TOTAL ABDOMINAL HYSTERECTOMY W/ BILATERAL SALPINGOOPHORECTOMY  1997   endometriosis    Prior to Admission medications   Medication Sig Start Date End Date Taking? Authorizing Provider  Accu-Chek FastClix Lancets MISC USE AS INSTRUCTED TO CHECK BLOOD SUGAR ONCE DAILY 10/13/19   [name], MD  ACCU-CHEK GUIDE test strip USE AS INSTRUCTED TO CHECK BLOOD SUGAR ONCE DAILY 10/10/19   [name], MD  aspirin EC 81 MG tablet Take 81 mg by mouth at bedtime.    [provider]  Blood Glucose Monitoring Suppl (ACCU-CHEK GUIDE) w/Device KIT 1 each by Does not apply route as directed. Use as instructed to check blood sugar once daily 07/14/19   [name], MD  Calcium Carbonate-Vitamin D 600-400 MG-UNIT tablet Take 1 tablet by mouth 2 (two) times daily.    [provider]  clonazePAM (KLONOPIN) 0.5 MG tablet TAKE 1 TABLET BY MOUTH TWICE A DAY AS NEEDED 07/13/20   [name], MD  Coenzyme Q10 (COQ10) 100 MG CAPS Take 1 capsule by mouth at bedtime.     [provider]  ezetimibe (ZETIA) 10 MG tablet TAKE 1 TABLET BY MOUTH ONCE DAILY 07/09/20   Jerline Pain, MD  Glucosamine-Chondroitin (MOVE FREE PO) Take 2 tablets by mouth daily.    [provider]  levothyroxine (SYNTHROID) 50 MCG tablet Take 1 tablet (50 mcg total) by mouth daily. 06/18/20   [name], MD  loperamide (IMODIUM A-D) 2 MG tablet Take 2 mg by mouth as needed.     [provider]  mirtazapine (REMERON) 30 MG tablet Take 1 tablet (30 mg total) by mouth at bedtime. 06/18/20   [name], MD  nitroGLYCERIN (NITROLINGUAL) 0.4 MG/SPRAY spray Place 1 spray under the tongue every 5 (five) minutes as needed. 05/23/16   Jerline Pain, MD  pravastatin (PRAVACHOL) 80 MG tablet  TAKE 1 TABLET BY MOUTH EVERY DAY IN THE EVENING 08/15/19   Burtis Junes, NP  Semaglutide (RYBELSUS) 3 MG TABS Take 3 mg by mouth daily. 06/24/20   [name], MD  venlafaxine XR (EFFEXOR-XR) 150 MG 24 hr capsule Take 1 capsule (150 mg total) by mouth daily with breakfast. 05/29/20   [name], MD    Allergies Sulfa antibiotics and Crestor [rosuvastatin calcium]  Family History  Problem Relation Age of Onset  . CAD Father 22  . CAD Mother 82  . Diabetes Mother   . Rheum arthritis Mother   . Cancer Maternal Aunt        breast  . CAD Other        strong paternal side  . Stroke Neg Hx     Social History Social History   Tobacco Use  . Smoking status: Never Smoker  . Smokeless  tobacco: Never Used  Vaping Use  . Vaping Use: Never used  Substance Use Topics  . Alcohol use: No  . Drug use: No    Review of Systems Constitutional: No fever. Eyes: No visual changes. ENT: No sore throat. Cardiovascular: Denies chest pain. Respiratory: Denies shortness of breath. Gastrointestinal: No nausea, vomiting, diarrhea. Genitourinary: Negative for dysuria. Musculoskeletal: Negative for back pain. Skin: Negative for rash. Neurological: Negative for focal weakness or numbness.  ____________________________________________   PHYSICAL EXAM:  VITAL SIGNS: ED Triage Vitals  Enc Vitals Group     BP 07/27/20 2346 (!) 162/62     Pulse Rate 07/27/20 2346 (!) 59     Resp 07/27/20 2346 (!) 21     Temp 07/27/20 2346 98.7 F (37.1 C)     Temp Source 07/27/20 2346 Oral     SpO2 07/27/20 2346 100 %     Weight 07/27/20 2346 150 lb 2.1 oz (68.1 kg)     Height 07/27/20 2346 _0  (1.575 m)     Head Circumference --      Peak Flow --      Pain Score 07/27/20 2345 5     Pain Loc --      Pain Edu? --      Excl. in [place]? --    CONSTITUTIONAL: Alert and oriented and responds appropriately to questions. Well-appearing; well-nourished HEAD: Normocephalic, atraumatic EYES:  Conjunctivae clear, pupils appear equal, EOM appear intact ENT: normal nose; moist mucous membranes NECK: Supple, normal ROM, patient does have midline spinal tenderness at the lower cervical spine without step-off or deformity CARD: RRR; S1 and S2 appreciated; no murmurs, no clicks, no rubs, no gallops RESP: Normal chest excursion without splinting or tachypnea; breath sounds clear and equal bilaterally; no wheezes, no rhonchi, no rales, no hypoxia or respiratory distress, speaking full sentences ABD/GI: Normal bowel sounds; non-distended; soft, non-tender, no rebound, no guarding, no peritoneal signs, no hepatosplenomegaly BACK: The back appears normal no midline spinal tenderness or step-off or deformity; + kyphosis EXT: Normal ROM in all joints; no deformity noted, no edema; no cyanosis, no calf tenderness or calf swelling, no bony tenderness, compartments soft, extremities warm and well perfused, no tenderness to palpation over the hips bilaterally, no leg length discrepancy SKIN: Normal color for age and race; warm; no rash on exposed skin NEURO: Moves all extremities equally, strength 5 -/5 in all 4 extremities, patient intermittently has poor effort, reports normal sensation diffusely, cranial nerves II to XII intact, normal speech PSYCH: The patient's mood and manner are appropriate.  ____________________________________________   LABS (all labs ordered are listed, but only abnormal results are displayed)  Labs Reviewed  CBC WITH DIFFERENTIAL/PLATELET - Abnormal; Notable for the following components:      Result Value   WBC 13.6 (*)    RBC 3.46 (*)    Hemoglobin 10.0 (*)    HCT 29.9 (*)    Neutro Abs 11.4 (*)    Abs Immature Granulocytes 0.08 (*)    All other components within normal limits  BASIC METABOLIC PANEL - Abnormal; Notable for the following components:   Sodium 126 (*)    Chloride 97 (*)    CO2 21 (*)    Glucose, Bld 232 (*)    BUN 29 (*)    Creatinine, Ser 1.14  (*)    Calcium 7.4 (*)    GFR, Estimated 49 (*)    All other components within normal limits  URINALYSIS, ROUTINE W REFLEX MICROSCOPIC -  Abnormal; Notable for the following components:   Color, Urine COLORLESS (*)    APPearance CLEAR (*)    Specific Gravity, Urine 1.002 (*)    All other components within normal limits  CBG MONITORING, ED - Abnormal; Notable for the following components:   Glucose-Capillary 222 (*)    All other components within normal limits  RESP PANEL BY RT-PCR (FLU A&B, COVID) ARPGX2  TSH  TROPONIN I (HIGH SENSITIVITY)  TROPONIN I (HIGH SENSITIVITY)   ____________________________________________  EKG   EKG Interpretation  Date/Time:  Friday July 27 2020 23:42:44 EDT Ventricular Rate:  57 PR Interval:  175 QRS Duration: 96 QT Interval:  443 QTC Calculation: 432 R Axis:   65 Text Interpretation: Sinus rhythm Abnrm T, consider ischemia, anterolateral lds No significant change since last tracing Confirmed by Pryor Curia [phone]) on 07/27/2020 11:54:06 PM       ____________________________________________  RADIOLOGY Jessie Foot Mikiyah Glasner, personally viewed and evaluated these images (plain radiographs) as part of my medical decision making, as well as reviewing the written report by the radiologist.  ED MD interpretation: CT head and cervical spine show no acute abnormality.  Chest x-ray clear.  Official radiology report(s): DG Chest 2 View  Result Date: 07/28/2020 CLINICAL DATA:  Generalized weakness, leukocytosis EXAM: CHEST - 2 VIEW COMPARISON:  CT 05/17/2013, radiograph 05/02/2009 FINDINGS: Low volumes and atelectasis. No consolidation, features of edema, pneumothorax, or effusion. Accessory azygos fissure noted, anatomic variant. Prior sternotomy and CABG. The aorta is calcified. The remaining cardiomediastinal contours are unremarkable. Pulmonary vascularity is normally distributed. No acute osseous or soft tissue abnormality. Telemetry leads overlie the  chest. IMPRESSION: Low volumes and atelectasis. No other acute cardiopulmonary abnormality. Prior sternotomy and CABG. Aortic Atherosclerosis (ICD10-I70.0). Electronically Signed   By: Lovena Le M.D.   On: 07/28/2020 00:56   CT Head Wo Contrast  Result Date: 07/28/2020 CLINICAL DATA:  Fall EXAM: CT HEAD WITHOUT CONTRAST CT CERVICAL SPINE WITHOUT CONTRAST TECHNIQUE: Multidetector CT imaging of the head and cervical spine was performed following the standard protocol without intravenous contrast. Multiplanar CT image reconstructions of the cervical spine were also generated. COMPARISON:  CT head 09/09/2017, cervical spine radiograph 02/18/2012 FINDINGS: CT HEAD FINDINGS Brain: No evidence of acute infarction, hemorrhage, hydrocephalus, extra-axial collection, visible mass lesion or mass effect. Symmetric prominence of the ventricles, cisterns and sulci compatible with moderate parenchymal volume loss. Patchy and confluent areas of white matter hypoattenuation are most compatible with chronic microvascular angiopathy. Vascular: Atherosclerotic calcification of the carotid siphons. No hyperdense vessel. Skull: No calvarial fracture or suspicious osseous lesion. No scalp swelling or hematoma. Sinuses/Orbits: Paranasal sinuses and mastoid air cells are predominantly clear. Included orbital structures are unremarkable. Other: None. CT CERVICAL SPINE FINDINGS Alignment: Stabilization collar absent at the time of examination. Reversal of the normal cervical lordosis centered at C3 with stepwise retrolisthesis C3-C6 of 2-3 mm at each level. Finding is unchanged from comparison radiography 02/18/2012. Favored to be on a chronic, degenerative basis. No evidence of traumatic listhesis. No abnormally widened, perched or jumped facets. Normal alignment of the craniocervical and atlantoaxial articulations. Skull base and vertebrae: No acute skull base fracture. No vertebral body fracture or height loss. Normal bone  mineralization. Calcific pannus formation posterior to the dens and at the interspinous interval C2-C3 nonspecific but can be seen with CPPD or rheumatoid arthropathy. Soft tissues and spinal canal: No pre or paravertebral fluid or swelling. No visible canal hematoma. Airways patent. Bilateral cervical carotid atherosclerosis. Disc levels: Multilevel intervertebral  disc height loss with spondylitic endplate changes. Larger disc osteophyte complexes are present C3-4, C4-5 and C5-6 resulting in mild canal stenosis. Multilevel uncinate spurring and facet hypertrophic changes result in mild-to-moderate multilevel neural foraminal narrowing as well. Upper chest: Accessory azygos fissure. No acute abnormality in the upper chest or imaged lung apices. Proximal great vessel calcification. Other: Normal thyroid. IMPRESSION: 1. No acute intracranial abnormality. No scalp swelling or calvarial fracture. 2. Moderate parenchymal volume loss and chronic microvascular ischemic white matter disease. 3. No acute fracture or traumatic listhesis of the cervical spine. 4. Reversal of the normal cervical lordosis with stepwise retrolisthesis C3-C6 of 2-3 mm at each level. Favored to be on a chronic, degenerative basis. Additional multilevel degenerative changes of the cervical spine as described above. 5. Calcific pannus formation posterior to the dens and at the interspinous interval C2-C3, nonspecific but can be seen with CPPD or rheumatoid arthropathy. 6. Bilateral cervical carotid and intracranial atherosclerosis. Electronically Signed   By: Lovena Le M.D.   On: 07/28/2020 00:39   CT Cervical Spine Wo Contrast  Result Date: 07/28/2020 CLINICAL DATA:  Fall EXAM: CT HEAD WITHOUT CONTRAST CT CERVICAL SPINE WITHOUT CONTRAST TECHNIQUE: Multidetector CT imaging of the head and cervical spine was performed following the standard protocol without intravenous contrast. Multiplanar CT image reconstructions of the cervical spine were  also generated. COMPARISON:  CT head 09/09/2017, cervical spine radiograph 02/18/2012 FINDINGS: CT HEAD FINDINGS Brain: No evidence of acute infarction, hemorrhage, hydrocephalus, extra-axial collection, visible mass lesion or mass effect. Symmetric prominence of the ventricles, cisterns and sulci compatible with moderate parenchymal volume loss. Patchy and confluent areas of white matter hypoattenuation are most compatible with chronic microvascular angiopathy. Vascular: Atherosclerotic calcification of the carotid siphons. No hyperdense vessel. Skull: No calvarial fracture or suspicious osseous lesion. No scalp swelling or hematoma. Sinuses/Orbits: Paranasal sinuses and mastoid air cells are predominantly clear. Included orbital structures are unremarkable. Other: None. CT CERVICAL SPINE FINDINGS Alignment: Stabilization collar absent at the time of examination. Reversal of the normal cervical lordosis centered at C3 with stepwise retrolisthesis C3-C6 of 2-3 mm at each level. Finding is unchanged from comparison radiography 02/18/2012. Favored to be on a chronic, degenerative basis. No evidence of traumatic listhesis. No abnormally widened, perched or jumped facets. Normal alignment of the craniocervical and atlantoaxial articulations. Skull base and vertebrae: No acute skull base fracture. No vertebral body fracture or height loss. Normal bone mineralization. Calcific pannus formation posterior to the dens and at the interspinous interval C2-C3 nonspecific but can be seen with CPPD or rheumatoid arthropathy. Soft tissues and spinal canal: No pre or paravertebral fluid or swelling. No visible canal hematoma. Airways patent. Bilateral cervical carotid atherosclerosis. Disc levels: Multilevel intervertebral disc height loss with spondylitic endplate changes. Larger disc osteophyte complexes are present C3-4, C4-5 and C5-6 resulting in mild canal stenosis. Multilevel uncinate spurring and facet hypertrophic changes  result in mild-to-moderate multilevel neural foraminal narrowing as well. Upper chest: Accessory azygos fissure. No acute abnormality in the upper chest or imaged lung apices. Proximal great vessel calcification. Other: Normal thyroid. IMPRESSION: 1. No acute intracranial abnormality. No scalp swelling or calvarial fracture. 2. Moderate parenchymal volume loss and chronic microvascular ischemic white matter disease. 3. No acute fracture or traumatic listhesis of the cervical spine. 4. Reversal of the normal cervical lordosis with stepwise retrolisthesis C3-C6 of 2-3 mm at each level. Favored to be on a chronic, degenerative basis. Additional multilevel degenerative changes of the cervical spine as described above. 5.  Calcific pannus formation posterior to the dens and at the interspinous interval C2-C3, nonspecific but can be seen with CPPD or rheumatoid arthropathy. 6. Bilateral cervical carotid and intracranial atherosclerosis. Electronically Signed   By: Lovena Le M.D.   On: 07/28/2020 00:39    ____________________________________________   PROCEDURES  Procedure(s) performed (including Critical Care):  Procedures   ____________________________________________   INITIAL IMPRESSION / ASSESSMENT AND PLAN / ED COURSE  As part of my medical decision making, I reviewed the following data within the [place] notes reviewed and incorporated, Labs reviewed , EKG interpreted , Old EKG reviewed, Old chart reviewed, Radiograph reviewed  and Notes from prior ED visits         Patient here with generalized weakness and a fall that occurred yesterday.  Differential includes anemia, electrolyte derangement, ACS, UTI, dehydration, thyroid dysfunction, hyperglycemia, DKA.  No focal neurologic deficits to suggest CVA.  Will obtain CT of the head and cervical spine given fall yesterday.  Will obtain labs, urine.  EKG shows no new ischemic change.  ED PROGRESS  Patient's  work-up has been unrevealing.  She does have a mild leukocytosis but is afebrile and has no symptoms of infection.  Chest x-ray is clear.  Urine does not appear infected today.  She has had 2 normal flat troponins.  She does have mild hyponatremia, corrected for hyperglycemia is 128.  She has received normal saline here in the ED.  Creatinine elevated but this is her baseline.  Her glucose is 232 and she is not on any medications for diabetes.  Bicarb 21 but normal anion gap and no ketones.  I do not think she is in DKA.  Blood sugar has improved on its own compared to what it was at home.  Have advised her to follow-up with her primary care physician for this.  Her Covid and flu tests are negative.  CT head and cervical spine showed no acute traumatic injury.  She reports feeling much better and has been able to ambulate without difficulty.  I feel she is safe to be discharged back home.  She is comfortable with this plan.   At this time, I do not feel there is any life-threatening condition present. I have reviewed, interpreted and discussed all results (EKG, imaging, lab, urine as appropriate) and exam findings with patient/family. I have reviewed nursing notes and appropriate previous records.  I feel the patient is safe to be discharged home without further emergent workup and can continue workup as an outpatient as needed. Discussed usual and customary return precautions. Patient/family verbalize understanding and are comfortable with this plan.  Outpatient follow-up has been provided as needed. All questions have been answered.   Patient's TSH is also come back slightly low today.  I added on a free T4 given I am not concerned for thyroid storm or thyrotoxicosis, I feel she can be discharged and have this followed up as an outpatient.  Patient's T4 has come back at a normal range. ____________________________________________   FINAL CLINICAL IMPRESSION(S) / ED DIAGNOSES  Final diagnoses:   Generalized weakness  Hyperglycemia  Injury of head, initial encounter     ED Discharge Orders    None      *Please note:  ADDILEE NEU was evaluated in Emergency Department on 07/28/2020 for the symptoms described in the history of present illness. She was evaluated in the context of the global COVID-19 pandemic, which necessitated consideration that the patient might be at  risk for infection with the SARS-CoV-2 virus that causes COVID-19. Institutional protocols and algorithms that pertain to the evaluation of patients at risk for COVID-19 are in a state of rapid change based on information released by regulatory bodies including the CDC and federal and state organizations. These policies and algorithms were followed during the patient's care in the ED.  Some ED evaluations and interventions may be delayed as a result of limited staffing during and the pandemic.*   Note:  This document was prepared using Dragon voice recognition software and may include unintentional dictation errors.   Modesta Sammons, Delice Bison, DO 07/28/20 0352    Rohail Klees, Delice Bison, DO 07/28/20 0404    Junious Ragone, Delice Bison, DO 07/28/20 [phone]

## 2020-07-30 ENCOUNTER — Telehealth: Payer: Self-pay

## 2020-07-30 NOTE — Chronic Care Management (AMB) (Addendum)
    Chronic Care Management Pharmacy Assistant   Name: PIETRINA JAGODZINSKI  MRN: [id number] DOB: January 12, 1941  Reason for Encounter: Reminder Call   Conditions to be addressed/monitored: DMII  Recent office visit 06/18/2020 - Dr.Gutierrez/PCP - Increase mirtazapine to 30 mg daily   Hospital visits:  None in previous 6 months  Medications: Outpatient Encounter Medications as of 07/30/2020  Medication Sig   Accu-Chek FastClix Lancets MISC USE AS INSTRUCTED TO CHECK BLOOD SUGAR ONCE DAILY   ACCU-CHEK GUIDE test strip USE AS INSTRUCTED TO CHECK BLOOD SUGAR ONCE DAILY   aspirin EC 81 MG tablet Take 81 mg by mouth at bedtime.   Blood Glucose Monitoring Suppl (ACCU-CHEK GUIDE) w/Device KIT 1 each by Does not apply route as directed. Use as instructed to check blood sugar once daily   Calcium Carbonate-Vitamin D 600-400 MG-UNIT tablet Take 1 tablet by mouth 2 (two) times daily.   clonazePAM (KLONOPIN) 0.5 MG tablet TAKE 1 TABLET BY MOUTH TWICE A DAY AS NEEDED   Coenzyme Q10 (COQ10) 100 MG CAPS Take 1 capsule by mouth at bedtime.    ezetimibe (ZETIA) 10 MG tablet TAKE 1 TABLET BY MOUTH ONCE DAILY   Glucosamine-Chondroitin (MOVE FREE PO) Take 2 tablets by mouth daily.   levothyroxine (SYNTHROID) 50 MCG tablet Take 1 tablet (50 mcg total) by mouth daily.   loperamide (IMODIUM A-D) 2 MG tablet Take 2 mg by mouth as needed.    mirtazapine (REMERON) 30 MG tablet Take 1 tablet (30 mg total) by mouth at bedtime.   nitroGLYCERIN (NITROLINGUAL) 0.4 MG/SPRAY spray Place 1 spray under the tongue every 5 (five) minutes as needed.   pravastatin (PRAVACHOL) 80 MG tablet TAKE 1 TABLET BY MOUTH EVERY DAY IN THE EVENING   Semaglutide (RYBELSUS) 3 MG TABS Take 3 mg by mouth daily.   venlafaxine XR (EFFEXOR-XR) 150 MG 24 hr capsule Take 1 capsule (150 mg total) by mouth daily with breakfast.   No facility-administered encounter medications on file as of 07/30/2020.    SHARIECE VIVEIROS was contacted to remind her of  her upcoming telephone visit with Debbora Dus on 07/31/2020 at 9:30am.  she was reminded to have all medications, supplements and any blood glucose and blood pressure readings available for review at appointment.  Are you having any problems with your medications? Reviewed medication list with patient and she does not have any Semaglutide 54m. Requested refill for patient.   What concerns would you like to discuss with the pharmacist? The patient states that her visit to the ED 07/27/2020 was due to high BG   Star Rating Drugs: Medication:  Last Fill: Day Supply Pravastatin 877m3/10/2020 90ds semaglutide 65m28m3/13/2022 30ds  MicDebbora DusPP notified  VelAvel SensorCMEagle Villagesistant [phone] have reviewed the care management and care coordination activities outlined in this encounter and I am certifying that I agree with the content of this note. No further action required.  MicDebbora DusharmD Clinical Pharmacist [facility] at [phone]

## 2020-07-31 ENCOUNTER — Other Ambulatory Visit: Payer: Self-pay

## 2020-07-31 ENCOUNTER — Ambulatory Visit (INDEPENDENT_AMBULATORY_CARE_PROVIDER_SITE_OTHER): Payer: PPO

## 2020-07-31 ENCOUNTER — Telehealth: Payer: Self-pay

## 2020-07-31 DIAGNOSIS — E118 Type 2 diabetes mellitus with unspecified complications: Secondary | ICD-10-CM

## 2020-07-31 DIAGNOSIS — E1169 Type 2 diabetes mellitus with other specified complication: Secondary | ICD-10-CM | POA: Diagnosis not present

## 2020-07-31 DIAGNOSIS — F332 Major depressive disorder, recurrent severe without psychotic features: Secondary | ICD-10-CM | POA: Diagnosis not present

## 2020-07-31 DIAGNOSIS — E785 Hyperlipidemia, unspecified: Secondary | ICD-10-CM

## 2020-07-31 MED ORDER — RYBELSUS 3 MG PO TABS: 3.0000 mg | ORAL_TABLET | Freq: Every day | ORAL | 3 refills | Status: DC

## 2020-07-31 NOTE — Progress Notes (Signed)
Chronic Care Management Pharmacy Note  08/01/2020 Name:  Kathleen Williamson MRN:  [id number] DOB:  02/05/1941  Subjective: Kathleen Williamson is an 80 y.o. year old female who is a primary patient of [name], MD.  The CCM team was consulted for assistance with disease management and care coordination needs.    Engaged with patient by telephone for follow up visit in response to provider referral for pharmacy case management and/or care coordination services.   Consent to Services:  The patient was given information about Chronic Care Management services, agreed to services, and gave verbal consent prior to initiation of services.  Please see initial visit note for detailed documentation.   Patient Care Team: [name], MD as PCP - General (Family Medicine) Jerline Pain, MD as PCP - Cardiology (Cardiology) Dingeldein, Remo Lipps, MD as Referring Physician (Ophthalmology) Debbora Dus, [facility] as Pharmacist (Pharmacist)  Patient health concerns: She is lonely and would like to have more to do. She wants to volunteer at the [employer]. Concerned about high BG. Also reports imbalance and falls lately. She usually falls backwards. See recent ED visit.   Recent office visits: 06/24/20 - PCP Lab notes - Plz notify kidneys remain impaired but stable (stage 3 kidney disease). We will continue to watch. Sugar elevated with A1c up to 7.6% - in uncontrolled diabetes range - recommend she price out rybelsus medicine sent to pharmacy. It slows down stomach emptying and lowers blood sugar. Let us know if too expensive. Ensure no fmhx thyroid cancer. Liver, blood counts, cholesterol and thyroid returned ok.  06/18/2020 - Dr. Danise Mina, PCP - INCREASE Mirtazapine from 15 to 30 mg daily at bedtime 05/29/20 - Dr. Danise Mina, PCP - STOP Celexa, Continue venlafaxine and mirtazapine.  05/09/20- Dr. Danise Mina- PCP- Decreased citalopram from 20 mg daily to 10 mg daily. Increased venlafaxine from 75 mg daily  to 150 mg daily  Recent consult visits: 03/22/20- Theresa Duty, PA-C- Orthopedics 02/28/20- Dr. Boneta Lucks- Podiatry 02/01/20- Dr. Suella Broad- Orthopedics 02/12/20- Gerrit Halls, PA-C Orthopedics 01/17/20- Dr. Danella Sensing - Dermatology  [facility] visits: ED - 07/27/20 - Generalized weakness x 2 weeks, fall. BG 315, not on any medications for diabetes. Work up unremarkable. Follow up with PCP for diabetes management.  Objective:  Lab Results  Component Value Date   CREATININE 1.14 (H) 07/27/2020   BUN 29 (H) 07/27/2020   GFR 32.40 (L) 06/18/2020   GFRNONAA 49 (L) 07/27/2020   GFRAA 46 (L) 01/31/2014   NA 126 (L) 07/27/2020   K 4.2 07/27/2020   CALCIUM 7.4 (L) 07/27/2020   CO2 21 (L) 07/27/2020   GLUCOSE 232 (H) 07/27/2020    Lab Results  Component Value Date/Time   HGBA1C 7.6 (H) 06/18/2020 10:55 AM   HGBA1C 7.1 (H) 09/16/2019 02:42 PM   GFR 32.40 (L) 06/18/2020 10:55 AM   GFR 36.10 (L) 09/16/2019 02:42 PM   MICROALBUR 5.7 (H) 06/18/2020 10:55 AM   MICROALBUR 0.7 02/18/2019 09:44 AM    Last diabetic Eye exam:  Lab Results  Component Value Date/Time   HMDIABEYEEXA No Retinopathy 05/22/2020 12:00 AM    Last diabetic Foot exam:  05/26/2018 normal   Lab Results  Component Value Date   CHOL 156 02/18/2019   HDL 39.30 02/18/2019   LDLCALC 77 02/18/2019   LDLDIRECT 77.0 06/18/2020   TRIG 194.0 (H) 02/18/2019   CHOLHDL 4 02/18/2019    Hepatic Function Latest Ref Rng & Units 06/18/2020 02/18/2019 09/29/2018  Total Protein 6.0 -  8.3 g/dL 6.5 6.7 -  Albumin 3.5 - 5.2 g/dL 4.0 4.1 4.1  AST 0 - 37 U/L 12 11 -  ALT 0 - 35 U/L 13 9 -  Alk Phosphatase 39 - 117 U/L 69 45 -  Total Bilirubin 0.2 - 1.2 mg/dL 0.4 0.5 -    Lab Results  Component Value Date/Time   TSH 0.346 (L) 07/28/2020 12:26 AM   TSH 0.96 06/18/2020 10:55 AM   TSH 0.93 02/18/2019 09:44 AM   FREET4 0.85 07/28/2020 02:21 AM   FREET4 0.84 02/16/2018 09:24 AM    CBC Latest Ref Rng & Units 07/27/2020  06/18/2020 02/18/2019  WBC 4.0 - 10.5 K/uL 13.6(H) 8.2 6.7  Hemoglobin 12.0 - 15.0 g/dL 10.0(L) 12.2 12.2  Hematocrit 36.0 - 46.0 % 29.9(L) 36.3 37.2  Platelets 150 - 400 K/uL 265 270.0 253.0    Lab Results  Component Value Date/Time   VD25OH 29.07 (L) 06/18/2020 10:55 AM   VD25OH 35.78 09/16/2019 02:42 PM    Clinical ASCVD: Yes  The 10-year ASCVD risk score Mikey Bussing DC Jr., et al., 2013) is: 46%   Values used to calculate the score:     Age: 72 years     Sex: Female     Is Non-Hispanic African American: No     Diabetic: Yes     Tobacco smoker: No     Systolic Blood Pressure: 161 mmHg     Is BP treated: No     HDL Cholesterol: 39.3 mg/dL     Total Cholesterol: 156 mg/dL    Depression screen [facility] 2/9 06/18/2020 05/29/2020 05/09/2020  Decreased Interest 0 1 1  Down, Depressed, Hopeless 1 1 3   PHQ - 2 Score 1 2 4   Altered sleeping 0 1 1  Tired, decreased energy 0 1 1  Change in appetite 1 2 0  Feeling bad or failure about yourself  0 1 0  Trouble concentrating 0 0 0  Moving slowly or fidgety/restless 0 0 0  Suicidal thoughts 0 0 1  PHQ-9 Score 2 7 7   Difficult doing work/chores - - -  Some recent data might be hidden    Social History   Tobacco Use  Smoking Status Never Smoker  Smokeless Tobacco Never Used   BP Readings from Last 3 Encounters:  07/28/20 (!) 149/65  06/18/20 (!) 142/64  05/29/20 134/78   Pulse Readings from Last 3 Encounters:  07/28/20 61  06/18/20 96  05/29/20 82   Wt Readings from Last 3 Encounters:  07/27/20 150 lb 2.1 oz (68.1 kg)  06/18/20 150 lb 3 oz (68.1 kg)  05/29/20 152 lb 6 oz (69.1 kg)   BMI Readings from Last 3 Encounters:  07/27/20 27.46 kg/m  06/18/20 27.25 kg/m  05/29/20 27.43 kg/m    Assessment/Interventions: Review of patient past medical history, allergies, medications, health status, including review of consultants reports, laboratory and other test data, was performed as part of comprehensive evaluation and provision of  chronic care management services.   SDOH:  (Social Determinants of Health) assessments and interventions performed: Yes SDOH Interventions   Flowsheet Row Most Recent Value  SDOH Interventions   Financial Strain Interventions Intervention Not Indicated  [Meds affordable]     SDOH Screenings   Alcohol Screen: Not on file  Depression (PHQ2-9): Low Risk   . PHQ-2 Score: 2  Financial Resource Strain: Low Risk   . Difficulty of Paying Living Expenses: Not very hard  Food Insecurity: No Food Insecurity  . Worried  About Running Out of Food in the Last Year: Never true  . Ran Out of Food in the Last Year: Never true  Housing: Low Risk   . Last Housing Risk Score: 0  Physical Activity: Not on file  Social Connections: Not on file  Stress: Not on file  Tobacco Use: Low Risk   . Smoking Tobacco Use: Never Smoker  . Smokeless Tobacco Use: Never Used  Transportation Needs: No Transportation Needs  . Lack of Transportation (Medical): No  . Lack of Transportation (Non-Medical): No    CCM Care Plan  Allergies  Allergen Reactions  . Sulfa Antibiotics Nausea Only  . Crestor [Rosuvastatin Calcium] Other (See Comments)    Leg ache    Medications Reviewed Today    Reviewed by Debbora Dus, [facility] (Pharmacist) on 08/01/20 at 1533  Med List Status: <None>  Medication Order Taking? Sig Documenting Provider Last Dose Status Informant  Accu-Chek FastClix Lancets MISC [id number] Yes USE AS INSTRUCTED TO CHECK BLOOD SUGAR ONCE DAILY [name], MD Taking Active   ACCU-CHEK GUIDE test strip [id number] Yes USE AS INSTRUCTED TO CHECK BLOOD SUGAR ONCE DAILY [name], MD Taking Active   aspirin EC 81 MG tablet [id number] Yes Take 81 mg by mouth at bedtime. [provider] Taking Active Self  Blood Glucose Monitoring Suppl (ACCU-CHEK GUIDE) w/Device KIT [id number] Yes 1 each by Does not apply route as directed. Use as instructed to check blood sugar once daily [name], MD  Taking Active   Calcium Carbonate-Vitamin D 600-400 MG-UNIT tablet [id number] No Take 1 tablet by mouth 2 (two) times daily.  Patient not taking: Reported on 07/31/2020   [provider] Not Taking Active   clonazePAM (KLONOPIN) 0.5 MG tablet [id number] Yes TAKE 1 TABLET BY MOUTH TWICE A DAY AS NEEDED [name], MD Taking Active   Coenzyme Q10 (COQ10) 100 MG CAPS [id number] Yes Take 1 capsule by mouth at bedtime.  [provider] Taking Active Self  ezetimibe (ZETIA) 10 MG tablet [id number] Yes Take 1 tablet (10 mg total) by mouth daily. Jerline Pain, MD Taking Active   levothyroxine (SYNTHROID) 50 MCG tablet [id number] Yes Take 1 tablet (50 mcg total) by mouth daily. [name], MD Taking Active   loperamide (IMODIUM A-D) 2 MG tablet [id number] Yes Take 2 mg by mouth as needed.  [provider] Taking Active   mirtazapine (REMERON) 30 MG tablet [id number] Yes Take 1 tablet (30 mg total) by mouth at bedtime.  Patient taking differently: Take 30 mg by mouth at bedtime. Pt reports taking 1/2 tablet of 30 mg (15 mg) daily 07/31/20   [name], MD Taking Active Self  nitroGLYCERIN (NITROLINGUAL) 0.4 MG/SPRAY spray [id number] Yes Place 1 spray under the tongue every 5 (five) minutes as needed. Jerline Pain, MD Taking Active   pravastatin (PRAVACHOL) 80 MG tablet [id number] Yes TAKE 1 TABLET BY MOUTH EVERY DAY IN THE Demetrius Charity, MD Taking Active   Semaglutide (RYBELSUS) 3 MG TABS [id number] No Take 3 mg by mouth daily.  Patient not taking: Reported on 07/31/2020   [name], MD Not Taking Active   venlafaxine XR (EFFEXOR-XR) 150 MG 24 hr capsule [id number] Yes Take 1 capsule (150 mg total) by mouth daily with breakfast.  Patient taking differently: Take 150 mg by mouth daily with breakfast. Pt reports taking 2 tablet (300 mg) daily 07/31/20   [name], MD Taking Active Self  Patient Active Problem List   Diagnosis Date  Noted  . Sensorineural hearing loss, bilateral 07/07/2020  . Osteoarthritis of right hip 09/17/2019  . Fear of falling 09/15/2017  . Tongue lesion 02/12/2017  . Osteoarthritis 12/19/2016  . Complicated grief 13/24/4010  . Health maintenance examination 02/07/2016  . Baker's cyst of knee 08/01/2015  . Diabetes mellitus type 2, controlled, with complications ([place]) 27/25/3664  . Subclavian steal syndrome   . Medicare annual wellness visit, subsequent 01/31/2015  . Advanced care planning/counseling discussion 01/31/2015  . Carotid stenosis 01/31/2015  . Vertebral artery stenosis/occlusion 01/31/2015  . CKD (chronic kidney disease) stage 3, GFR 30-59 ml/min (HCC) 01/21/2015  . Microscopic colitis 10/26/2014  . Osteoporosis   . Hypothyroidism   . Dermatomyositis ([place])   . MDD (major depressive disorder), recurrent severe, without psychosis ([place]) 01/12/2014  . Atherosclerosis of native coronary artery of native heart without angina pectoris 12/13/2013  . Hyperlipidemia associated with type 2 diabetes mellitus ([place]) 12/13/2013  . Pseudocyst of pancreas 08/29/2011    Immunization History  Administered Date(s) Administered  . Fluad Quad(high Dose 65+) 12/28/2018  . Influenza, High Dose Seasonal PF 12/18/2017, 12/23/2018, 01/23/2020  . Influenza,inj,Quad PF,6+ Mos 01/22/2015, 02/04/2016, 12/19/2016  . Influenza-Unspecified 04/01/2011  . Moderna SARS-COV2 Booster Vaccination 02/24/2020  . Moderna Sars-Covid-2 Vaccination 04/29/2019, 05/27/2019  . Pneumococcal Conjugate-13 02/04/2016  . Pneumococcal-Unspecified 03/24/2007  . Td 03/24/2007, 09/05/2016  . Tdap 09/09/2017, 01/25/2018, 09/12/2019  . Zoster 04/14/2009  . Zoster Recombinat (Shingrix) 02/20/2017, 05/02/2017    Conditions to be addressed/monitored:  Hyperlipidemia, Diabetes and Depression  Care Plan : [place]  Updates made by Debbora Dus, [facility] since 08/01/2020 12:00 AM    Problem: CHL AMB "PATIENT-SPECIFIC  PROBLEM"     Long-Range Goal: [place]   Start Date: 08/01/2020  Priority: High  Note:    Current Barriers:  . Suboptimal control - Depression, diabetes  . Taking medications differently than prescribed  Pharmacist Clinical Goal(s):  Marland Kitchen Patient will achieve adherence to monitoring guidelines and medication adherence to achieve therapeutic efficacy through collaboration with PharmD and provider.  Interventions: . 1:1 collaboration with [name], MD regarding development and update of comprehensive plan of care as evidenced by provider attestation and co-signature . Inter-disciplinary care team collaboration (see longitudinal plan of care) . Comprehensive medication review performed; medication list updated in electronic medical record  Hyperlipidemia/CAD: (LDL goal < 70) -Controlled - adequate, LDL 77   -Current treatment: ? Pravastatin 80 mg daily  Aspirin EC 81 mg daily  Zetia 10 mg daily  Nitroglycerin 0.4 mcg every 5 minutes prn -Medications previously tried: Crestor, niacin  -Educated on Cholesterol goals;  -Recommended to continue current medication  Diabetes (A1c goal <7%) -Not ideally controlled - A1c 7.6% -Pt never started Rybelsus 06/2020, she does not recall reason. Check price for patient.  -Current medications: Marland Kitchen Rybelsus 3 mg - 1 tablet daily (not taking) -Medications previously tried: none -Current home glucose readings - checking once weekly, before breakfast  . fasting glucose: 140-160s . post prandial glucose: none  -Denies hypoglycemic/hyperglycemic symptoms -Educated on A1c and blood sugar goals; Benefits of routine self-monitoring of blood sugar; -Counseled to check feet daily and get yearly eye exams - up to date -Recommended to continue current medication; Check price on Rybelsus for patient.   Depression/Anxiety (Goal: Control symptoms) -Not ideally controlled - per patient report -Current treatment:  Mirtazapine 30 mg -  1 daily at bedtime (patient taking 1/2 tablet daily -  15 mg)  Venlafaxine XR 150 mg - 1 daily with breakfast (pt taking 2 daily - 300 mg)  Clonazepam 0.5 mg - 1 tablet twice daily PRN -Medications previously tried/failed: Citalopram -We discussed lifestyle goals:  - Pt to ask office at [facility] if she can serve in [place] again  - Pt to call [employer] to inquire about volunteer service -Educated on Benefits of medication for symptom control Benefits of cognitive-behavioral therapy with or without medication -Recommended to continue current medication - take Mirtazapine and venlafaxine as prescribed.  Patient Goals/Self-Care Activities . Patient will:  - focus on medication adherence by utilizing adherence packaging  Follow Up Plan: Telephone follow up appointment with care management team member scheduled for: 3 months  Medication Assistance: None required.  Patient affirms current coverage meets needs.      Star Rating Drugs: Medication:                Last Fill:         Day Supply Pravastatin 38m        06/18/2020          90ds  Patient's preferred pharmacy is:  [id number] [place], [id number]: [phone]: [phone] [place] [id number]: [phone]: [phone] [place] [address] [place]one: [phone]: [phone] pill box? No - keeps them in a medicine bag Pt endorses good compliance  We discussed: Benefits of medication synchronization, packaging and delivery as well as enhanced pharmacist oversight with [facility]. Patient decided to: Continue current medication management strategy   Verbal consent obtained for [facility] enhanced pharmacy services (medication  synchronization, adherence packaging, delivery coordination). A medication sync plan was created to allow patient to get all medications delivered once every 30 to 90 days per patient preference. Patient understands they have freedom to choose pharmacy and clinical pharmacist will coordinate care between all prescribers and [facility].  Care Plan and Follow Up Patient Decision:  Patient agrees to Care Plan and Follow-up.  MiDebbora DusPharmD Clinical Pharmacist [facility] at [facility]3917 [phone]

## 2020-07-31 NOTE — Telephone Encounter (Signed)
E-scribed refill to [facility].

## 2020-07-31 NOTE — Telephone Encounter (Signed)
-----   Message from [place], [place] sent at 07/30/2020 11:55 AM EDT ----- Regarding: out of medication Contact: [phone] Spoke with the patient this am for a reminder call with Debbora Dus tomorrow am 9:30am  And the pt. Tells me she is out of Ryblesus 3mg . 1 a day , needs refill, and is requesting this from [facility] due to they deliver, and they are suppose to be coming to her house today   Avel Sensor, [place]

## 2020-07-31 NOTE — Progress Notes (Addendum)
    Chronic Care Management Pharmacy Assistant   Name: Kathleen Williamson  MRN: [id number] DOB: May 04, 1940  Reason for Encounter: [facility] Onboard Process  Medications: Outpatient Encounter Medications as of 07/31/2020  Medication Sig  . Accu-Chek FastClix Lancets MISC USE AS INSTRUCTED TO CHECK BLOOD SUGAR ONCE DAILY  . ACCU-CHEK GUIDE test strip USE AS INSTRUCTED TO CHECK BLOOD SUGAR ONCE DAILY  . aspirin EC 81 MG tablet Take 81 mg by mouth at bedtime.  . Blood Glucose Monitoring Suppl (ACCU-CHEK GUIDE) w/Device KIT 1 each by Does not apply route as directed. Use as instructed to check blood sugar once daily  . Calcium Carbonate-Vitamin D 600-400 MG-UNIT tablet Take 1 tablet by mouth 2 (two) times daily. (Patient not taking: Reported on 07/31/2020)  . clonazePAM (KLONOPIN) 0.5 MG tablet TAKE 1 TABLET BY MOUTH TWICE A DAY AS NEEDED  . Coenzyme Q10 (COQ10) 100 MG CAPS Take 1 capsule by mouth at bedtime.   Marland Kitchen ezetimibe (ZETIA) 10 MG tablet TAKE 1 TABLET BY MOUTH ONCE DAILY  . levothyroxine (SYNTHROID) 50 MCG tablet Take 1 tablet (50 mcg total) by mouth daily.  Marland Kitchen loperamide (IMODIUM A-D) 2 MG tablet Take 2 mg by mouth as needed.   . mirtazapine (REMERON) 30 MG tablet Take 1 tablet (30 mg total) by mouth at bedtime.  . nitroGLYCERIN (NITROLINGUAL) 0.4 MG/SPRAY spray Place 1 spray under the tongue every 5 (five) minutes as needed.  . pravastatin (PRAVACHOL) 80 MG tablet TAKE 1 TABLET BY MOUTH EVERY DAY IN THE EVENING  . Semaglutide (RYBELSUS) 3 MG TABS Take 3 mg by mouth daily.  Marland Kitchen venlafaxine XR (EFFEXOR-XR) 150 MG 24 hr capsule Take 1 capsule (150 mg total) by mouth daily with breakfast.   No facility-administered encounter medications on file as of 07/31/2020.    Following appointment with Debbora Dus on 07/31/20 patient requested to onboard with [facility]. Medication count was completed and onboarding form was completed. [facility]  patient's medications for delivery, med sync, and adherence packaging from [facility].  Requested refill from Dr. Marlou Porch office of Zetia and Pravastatin to be sent to [facility].  Follow-Up:  Comptroller and Pharmacist Review  Debbora Dus, CPP notified  Margaretmary Dys, [place] Pharmacy Assistant [phone]

## 2020-08-01 ENCOUNTER — Other Ambulatory Visit: Payer: Self-pay

## 2020-08-01 MED ORDER — EZETIMIBE 10 MG PO TABS: 10.0000 mg | ORAL_TABLET | Freq: Every day | ORAL | 0 refills | Status: DC

## 2020-08-01 MED ORDER — PRAVASTATIN SODIUM 80 MG PO TABS: ORAL_TABLET | ORAL | 0 refills | Status: DC

## 2020-08-01 NOTE — Patient Instructions (Signed)
Dear Kathleen Williamson,  Below is a summary of the goals we discussed during our follow up appointment on August 01, 2020. Please contact me anytime with questions or concerns.   Visit Information  Patient Care Plan: CCM Pharmacy Care Plan    Problem Identified: CHL AMB "PATIENT-SPECIFIC PROBLEM"     Long-Range Goal: [place]   Start Date: 08/01/2020  Priority: High  Note:    Current Barriers:  . Suboptimal control - Depression, diabetes  . Taking medications differently than prescribed  Pharmacist Clinical Goal(s):  Marland Kitchen Patient will achieve adherence to monitoring guidelines and medication adherence to achieve therapeutic efficacy through collaboration with PharmD and provider.  Interventions: . 1:1 collaboration with [name], MD regarding development and update of comprehensive plan of care as evidenced by provider attestation and co-signature . Inter-disciplinary care team collaboration (see longitudinal plan of care) . Comprehensive medication review performed; medication list updated in electronic medical record  Hyperlipidemia/CAD: (LDL goal < 70) -Controlled - adequate, LDL 77   -Current treatment: ? Pravastatin 80 mg daily  Aspirin EC 81 mg daily  Zetia 10 mg daily  Nitroglycerin 0.4 mcg every 5 minutes prn -Medications previously tried: Crestor, niacin  -Educated on Cholesterol goals;  -Recommended to continue current medication  Diabetes (A1c goal <7%) -Not ideally controlled - A1c 7.6% -Pt never started Rybelsus 06/2020, she does not recall reason. Check price for patient.  -Current medications: Marland Kitchen Rybelsus 3 mg - 1 tablet daily (not taking) -Medications previously tried: none -Current home glucose readings - checking once weekly, before breakfast  . fasting glucose: 140-160s . post prandial glucose: none  -Denies hypoglycemic/hyperglycemic symptoms -Educated on A1c and blood sugar goals; Benefits of routine self-monitoring of blood  sugar; -Counseled to check feet daily and get yearly eye exams - up to date -Recommended to continue current medication; Check price on Rybelsus for patient.   Depression/Anxiety (Goal: Control symptoms) -Not ideally controlled - per patient report -Current treatment:  Mirtazapine 30 mg - 1 daily at bedtime (patient taking 1/2 tablet daily - 15 mg)  Venlafaxine XR 150 mg - 1 daily with breakfast (pt taking 2 daily - 300 mg)  Clonazepam 0.5 mg - 1 tablet twice daily PRN -Medications previously tried/failed: Citalopram -We discussed lifestyle goals:  - Pt to ask office at [facility] if she can serve in [place] again  - Pt to call [employer] to inquire about volunteer service -Educated on Benefits of medication for symptom control Benefits of cognitive-behavioral therapy with or without medication -Recommended to continue current medication - take Mirtazapine and venlafaxine as prescribed.  Patient Goals/Self-Care Activities . Patient will:  - focus on medication adherence by utilizing adherence packaging  Follow Up Plan: Telephone follow up appointment with care management team member scheduled for: 3 months  Medication Assistance: None required.  Patient affirms current coverage meets needs.       The patient verbalized understanding of instructions, educational materials, and care plan provided today and agreed to receive a mailed copy of patient instructions, educational materials, and care plan.   Verbal consent obtained for [facility] enhanced pharmacy services (medication synchronization, adherence packaging, delivery coordination). A medication sync plan was created to allow patient to get all medications delivered once every 30 to 90 days per patient preference. Patient understands they have freedom to choose pharmacy and clinical pharmacist will coordinate care between all prescribers and [facility].  Debbora Dus, PharmD Clinical  Pharmacist [facility] at  [facility] [phone]   Basics of Medicine Management Taking your medicines correctly is an important part of managing or preventing medical problems. Make sure you know what disease or condition your medicine is treating, and how and when to take it. If you do not take your medicine correctly, it may not work well and may cause unpleasant side effects, including serious health problems. What should I do when I am taking medicines?  Read all the labels and inserts that come with your medicines. Review the information often.  Talk with your pharmacist if you get a refill and notice a change in the size, color, or shape of your medicines.  Know the potential side effects for each medicine that you take.  Try to get all your medicines from the same pharmacy. The pharmacist will have all your information and will understand how your medicines will affect each other (interact).  Tell your health care provider about all your medicines, including over-the-counter medicines, vitamins, and herbal or dietary supplements. He or she will make sure that nothing will interact with any of your prescribed medicines.   How can I take my medicines safely?  Take medicines only as told by your health care provider. ? Do not take more of your medicine than instructed. ? Do not take anyone else's medicines. ? Do not share your medicines with others. ? Do not stop taking your medicines unless your health care provider tells you to do so. ? You may need to avoid alcohol or certain foods or liquids when taking certain medicines. Follow your health care provider's instructions.  Do not split, mash, or chew your medicines unless your health care provider tells you to do so. Tell your health care provider if you have trouble swallowing your medicines.  For liquid medicine, use the dosing container that was provided. How should I organize my medicines? Know your  medicines  Know what each of your medicines looks like. This includes size, color, and shape. Tell your health care provider if you are having trouble recognizing all the medicines that you are taking.  If you cannot tell your medicines apart because they look similar, keep them in original bottles.  If you cannot read the labels on the bottles, tell your pharmacist to put your medicines in containers with large print.  Review your medicines and your schedule with family members, a friend, or a caregiver. Use a pill organizer  Use a tool to organize your medicine schedule. Tools include a weekly pillbox, a written chart, a notebook, or a calendar.  Your tool should help you remember the following things about each medicine: ? The name of the medicine. ? The amount (dose) to take. ? The schedule. This is the day and time the medicine should be taken. ? The appearance. This includes color, shape, size, and stamp. ? How to take your medicines. This includes instructions to take them with food, without food, with fluids, or with other medicines.  Create reminders for taking your medicines. Use sticky notes, or alarms on your watch, mobile device, or phone calendar.  You may choose to use a more advanced management system. These systems have storage, alarms, and visual and audio prompts.  Some medicines can be taken on an "as-needed" basis. These include medicines for nausea or pain. If you take an as-needed medicine, write down the name and dose, as well as the date and time that you took it.   How should I plan for travel?  Take  your pillbox, medicines, and organization system with you when traveling.  Have your medicines refilled before you travel. This will ensure that you do not run out of your medicines while you are away from home.  Always carry an updated list of your medicines with you. If there is an emergency, a first responder can quickly see what medicines you are taking.  Do  not pack your medicines in checked luggage in case your luggage is lost or delayed.  If any of your medicines is considered a controlled substance, make sure you bring a letter from your health care provider with you. How should I store and discard my medicines? For safe storage:  Store medicines in a cool, dry area away from light, or as directed by your health care provider. Do not store medicines in the bathroom. Heat and humidity will affect them.  Do not store your medicines with other chemicals, or with medicines for pets or other household members.  Keep medicines away from children and pets. Do not leave them on counters or bedside tables. Store them in high cabinets or on high shelves. For safe disposal:  Check expiration dates regularly. Do not take expired medicines. Discard medicines that are older than the expiration date.  Learn a safe way to dispose of your medicines. You may: ? Use a local government, hospital, or pharmacy medicine-take-back program. ? Mix the medicines with inedible substances, put them in a sealed bag or empty container, and throw them in the trash. What should I remember?  Tell your health care provider if you: ? Experience side effects. ? Have new symptoms. ? Have other concerns about taking your medicines.  Review your medicines regularly with your health care provider. Other medicines, diet, medical conditions, weight changes, and daily habits can all affect how medicines work. Ask if you need to continue taking each medicine, and discuss how well each one is working.  Refill your medicines early to avoid running out of them.  In case of an accidental overdose, call your local [place] at [phone] or visit your local emergency department immediately. This is important. Summary  Taking your medicines correctly is an important part of managing or preventing medical problems.  You need to make sure that you understand what you  are taking a medicine for, as well as how and when you need to take it.  Know your medicines and use a pill organizer to help you take your medicines correctly.  In case of an accidental overdose, call your local [place] at [phone] or visit your local emergency department immediately. This is important. This information is not intended to replace advice given to you by your health care provider. Make sure you discuss any questions you have with your health care provider. Document Revised: 03/26/2017 Document Reviewed: 03/26/2017 Elsevier Patient Education  2021 [employer].

## 2020-08-02 NOTE — Progress Notes (Signed)
Encounter details: CCM Time Spent       Value Time User   Time spent with patient (minutes)  130 08/01/2020  3:39 PM Debbora Dus, [facility]   Time spent performing Chart review  30 08/01/2020  3:39 PM Debbora Dus, [facility]   Total time (minutes)  160 08/01/2020  3:39 PM Debbora Dus, RPH      Moderate to High Complex Decision Making       Value Time User   Moderate to High complex decision making  Yes 08/01/2020  3:39 PM Debbora Dus, [facility]      CCM Services: This encounter meets complex CCM services and moderate to high decision making.  Prior to outreach and patient consent for Chronic Care Management, I referred this patient for services after reviewing the nominated patient list or from a personal encounter with the patient.  I have personally reviewed this encounter including the documentation in this note and have collaborated with the care management provider regarding care management and care coordination activities to include development and update of the comprehensive care plan. I am certifying that I agree with the content of this note and encounter as supervising physician.

## 2020-08-03 ENCOUNTER — Telehealth: Payer: Self-pay

## 2020-08-03 NOTE — Telephone Encounter (Signed)
Per Debbora Dus, Pharmacist, patient is requesting diabetes diet education be mailed to her. Educational information provided from [employer] and mailed to patient. Called patient and informed her that information was being mailed to her and that if she had any additional questions that she could reach out. Patient verbalized understanding.

## 2020-08-08 DIAGNOSIS — F419 Anxiety disorder, unspecified: Secondary | ICD-10-CM | POA: Diagnosis not present

## 2020-08-08 DIAGNOSIS — F331 Major depressive disorder, recurrent, moderate: Secondary | ICD-10-CM | POA: Diagnosis not present

## 2020-08-13 DIAGNOSIS — M1611 Unilateral primary osteoarthritis, right hip: Secondary | ICD-10-CM | POA: Diagnosis not present

## 2020-08-23 ENCOUNTER — Telehealth: Payer: PPO

## 2020-08-24 ENCOUNTER — Telehealth: Payer: Self-pay

## 2020-08-24 ENCOUNTER — Telehealth: Payer: Self-pay | Admitting: *Deleted

## 2020-08-24 NOTE — Telephone Encounter (Signed)
Tried to reach out to pt to schedule a pre op appt. No answer or vm came on. Pt's procedure is not until 11/14/20, though schedules are booking up very quickly. I wanted to offer to the pt an appt with Melina Copa, [facility] 08/29/20 @ 2:15 for pre op clearance.

## 2020-08-24 NOTE — Telephone Encounter (Signed)
   [place] Pre-operative Risk Assessment    Patient Name: Kathleen Williamson  DOB: 28-Nov-1940  MRN: [id number]   HEARTCARE STAFF: - Please ensure there is not already an duplicate clearance open for this procedure. - Under Visit Info/Reason for Call, type in Other and utilize the format Clearance MM/DD/YY or Clearance TBD. Do not use dashes or single digits. - If request is for dental extraction, please clarify the # of teeth to be extracted.  Request for surgical clearance:  1. What type of surgery is being performed? RIGHT TOTAL HIP ARTHROPLASTY-ANTERIOR   2. When is this surgery scheduled? 11/14/20   3. What type of clearance is required (medical clearance vs. Pharmacy clearance to hold med vs. Both)? MEDICAL  4. Are there any medications that need to be held prior to surgery and how long? ASA    5. Practice name and name of physician performing surgery? [facility]; DR. FRANK ALUISIO   6. What is the office phone number? [phone]   7.   What is the office fax number? [phone]  8.   Anesthesia type (None, local, MAC, general) ? CHOICE    Julaine Hua 08/24/2020, 2:11 PM  _________________________________________________________________   (provider comments below)

## 2020-08-24 NOTE — Telephone Encounter (Signed)
   Name: ARMIE MOREN  DOB: [birth year]  MRN: [id number]  Primary Cardiologist: Candee Furbish, MD  Chart reviewed as part of pre-operative protocol coverage. Because of Jessenya S Matheney's past medical history and time since last visit, she will require a follow-up visit in order to better assess preoperative cardiovascular risk.  Pre-op covering staff: - Please schedule appointment and call patient to inform them. If patient already had an upcoming appointment within acceptable timeframe, please add "pre-op clearance" to the appointment notes so provider is aware. - Please contact requesting surgeon's office via preferred method (i.e, phone, fax) to inform them of need for appointment prior to surgery.  If applicable, this message will also be routed to pharmacy pool and/or primary cardiologist for input on holding anticoagulant/antiplatelet agent as requested below so that this information is available to the clearing provider at time of patient's appointment.   Loel Dubonnet, NP  08/24/2020, 2:17 PM

## 2020-08-24 NOTE — Telephone Encounter (Addendum)
Received faxed surgical clearance form from [facility].    Spoke with pt scheduling pre-op eval on 10/01/20 at 11:30.   [Placed form in basket on Lisa's desk.]

## 2020-08-27 NOTE — Telephone Encounter (Signed)
S/w pt and who is agreeable to pre op appt. Pt has been scheduled to see Dr. Marlou Porch 08/31/20 @ 10:20. Pt is grateful for the call and our help. I will forward clearance note to MD for upcoming appt as well as FYI to requesting office pt has appt.

## 2020-08-28 ENCOUNTER — Telehealth: Payer: Self-pay

## 2020-08-28 NOTE — Progress Notes (Addendum)
Coordinated delivery of Rybelsus 3mg  - 1 tablet daily from [facility] on 08/29/20 to patient's residence.  Debbora Dus, CPP notified  Avel Sensor, [place] Assistant [phone]

## 2020-08-28 NOTE — Telephone Encounter (Signed)
Dr. Marlou Porch This patient will see you 08/31/20 for surgical clearance. Please send your office note to the surgeon.  Phone note will be removed from Pre-Op Pool. Richardson Dopp, PA-C    08/28/2020 9:20 AM

## 2020-08-28 NOTE — Telephone Encounter (Addendum)
Patient called about Rybelsus refill. She has been taking it daily in the morning for past month. She has 3-4 tablets remaining. Sugars are running "good", checks glucose daily before breakfast. Reports average BG 150. Not keeping a BG log.   She reports severe depression. She continues medications mirtazapine 15 mg (1 tablet daily) and venlafaxine XR 150 mg (1 tablet 150 mg daily). She states she has not been able to get in touch with the nurse at her assisted living facility to discuss volunteering. She feels very lonely. Crying often. She has not reached out to [employer]. Very little motivation for life. She follows up with her therapist routinely. Next appt last week of this month. We discussed increasing her mirtazapine to 30 mg as recommended by PCP at last visit. CCM will call weekly this month to check in on her.  Other - States she cannot take vitamin D3/calcium due to burping/burning in throat. She sees cardiologist on Friday.  Plan: -Take mirtazapine 30 mg daily as prescribed. -Refill Rybelsus - coordinate delivery from [facility] for 08/29/20 -CMA call weekly for next 4 weeks for depression review  Debbora Dus, PharmD Clinical Pharmacist [facility] at [facility] [phone]

## 2020-08-29 ENCOUNTER — Ambulatory Visit: Payer: PPO | Admitting: Family Medicine

## 2020-08-31 ENCOUNTER — Ambulatory Visit: Payer: PPO | Admitting: Cardiology

## 2020-08-31 ENCOUNTER — Other Ambulatory Visit: Payer: Self-pay

## 2020-08-31 ENCOUNTER — Encounter: Payer: Self-pay | Admitting: *Deleted

## 2020-08-31 ENCOUNTER — Encounter: Payer: Self-pay | Admitting: Cardiology

## 2020-08-31 VITALS — BP 130/70 | HR 97 | Ht 62.0 in | Wt 145.0 lb

## 2020-08-31 DIAGNOSIS — E782 Mixed hyperlipidemia: Secondary | ICD-10-CM

## 2020-08-31 DIAGNOSIS — I2510 Atherosclerotic heart disease of native coronary artery without angina pectoris: Secondary | ICD-10-CM | POA: Diagnosis not present

## 2020-08-31 DIAGNOSIS — Z01810 Encounter for preprocedural cardiovascular examination: Secondary | ICD-10-CM

## 2020-08-31 NOTE — Patient Instructions (Signed)
Medication Instructions: The current medical regimen is effective;  continue present plan and medications.  *If you need a refill on your cardiac medications before your next appointment, please call your pharmacy*  Testing/Procedures: Your physician has requested that you have a lexiscan myoview. For further information please visit [email or web address]. Please follow instruction sheet, as given.  Follow-Up: At [facility], you and your health needs are our priority.  As part of our continuing mission to provide you with exceptional heart care, we have created designated Provider Care Teams.  These Care Teams include your primary Cardiologist (physician) and Advanced Practice Providers (APPs -  Physician Assistants and Nurse Practitioners) who all work together to provide you with the care you need, when you need it.  We recommend signing up for the patient portal called "MyChart".  Sign up information is provided on this After Visit Summary.  MyChart is used to connect with patients for Virtual Visits (Telemedicine).  Patients are able to view lab/test results, encounter notes, upcoming appointments, etc.  Non-urgent messages can be sent to your provider as well.   To learn more about what you can do with MyChart, go to [email or web address].    Your next appointment:   1 year(s)  The format for your next appointment:   In Person  Provider:   Mark Skains, MD   Thank you for choosing [facility]!!    

## 2020-08-31 NOTE — Progress Notes (Signed)
Cardiology Office Note:    Date:  08/31/2020   ID:  Kathleen Williamson, DOB 03/20/1941, MRN [id number]  PCP:  [name], MD   [facility] Providers Cardiologist:  Candee Furbish, MD     Referring MD: [name], MD    History of Present Illness:    Kathleen Williamson is a 80 y.o. female here for follow up for coronary artery disease post CABG. 2003 (CABG-LIMA-LAD, free right IMA to PDA,SVG-diagonal, SVG-OM 2-OM 5 03/2002), carotid artery disease, dermatomyositis, depression, hyperlipidemia, hypothyroidism here for followup. Former patient of Dr. Leonia Reeves. Has a Baker's cyst on left knee. This has limited her walking. [employer].  Her husband died in 2013-10-01 had grieving. Lives at [facility] down on [address]  Enjoys it there. Has a cat named Saki. She is struggled in the past with her grief. She is without any family. No children.  Today, she is doing reasonably well, she is ready to undergo her total right hip arthroplasty procedure. She has fallen 3 times in the past 3-4 weeks. She is unaware of why she continues to fall frequently. She denies having any dizziness prior to falling. She notes losing her balance before her fall and she typically continues to stumble backwards. She denies having any exertional shortness of breath, chest pain, tightness, or pressure. She has no PND, orthopnea, or syncopal episodes.   Past Medical History:  Diagnosis Date  . Arthritis    fingers  . CAD (coronary artery disease) 2003   MI s/p 5v CABG  . Carotid stenosis    Kathleen Williamson  . CKD (chronic kidney disease) stage 3, GFR 30-59 ml/min (HCC)   . Dermatomyositis [facility])    saw Dr Estanislado Pandy, improved on its own  . Diabetes mellitus without complication ([place])   . Forehead laceration 09/15/2017  . History of chicken pox   . History of measles   . History of migraine none since 2003  . HLD (hyperlipidemia)   . Hypothyroidism   . MDD (major depressive disorder), recurrent episode,  moderate ([place])    h/o SI after lost husband unexpectedly 2015  . Microscopic colitis 05/2014   by colonoscopy, lymphocytic, zoloft related  . Osteoporosis    DEXA 03/2013 T -3.1, DEXA 10//2016 -2.9 hip, -2.2 spine  . Pancreatic cyst    Dr Ralene Ok, no f/u needed  . Prediabetes 2014  . Subclavian steal syndrome   . Urine incontinence     Past Surgical History:  Procedure Laterality Date  . APPENDECTOMY  1997   with hysterectomy  . [place]   lower  . BREAST LUMPECTOMY Left 1977   benign  . COLONOSCOPY WITH PROPOFOL N/A 05/23/2014   microscopic colitis - Garlan Fair, MD  . CORONARY ARTERY BYPASS GRAFT  2003   x 5 v  . ESOPHAGOGASTRODUODENOSCOPY (EGD) WITH PROPOFOL N/A 05/23/2014   WNL Garlan Fair, MD  . [place] Right 1990's  . TOOTH EXTRACTION  yrs ago  . TOTAL ABDOMINAL HYSTERECTOMY W/ BILATERAL SALPINGOOPHORECTOMY  1997   endometriosis    Current Medications: Current Meds  Medication Sig  . Accu-Chek FastClix Lancets MISC USE AS INSTRUCTED TO CHECK BLOOD SUGAR ONCE DAILY  . ACCU-CHEK GUIDE test strip USE AS INSTRUCTED TO CHECK BLOOD SUGAR ONCE DAILY  . aspirin EC 81 MG tablet Take 81 mg by mouth at bedtime.  . Blood Glucose Monitoring Suppl (ACCU-CHEK GUIDE) w/Device KIT 1 each by Does not apply route as  directed. Use as instructed to check blood sugar once daily  . Calcium Carbonate-Vitamin D 600-400 MG-UNIT tablet Take 1 tablet by mouth 2 (two) times daily.  . clonazePAM (KLONOPIN) 0.5 MG tablet TAKE 1 TABLET BY MOUTH TWICE A DAY AS NEEDED  . Coenzyme Q10 (COQ10) 100 MG CAPS Take 1 capsule by mouth at bedtime.   Marland Kitchen ezetimibe (ZETIA) 10 MG tablet Take 1 tablet (10 mg total) by mouth daily.  Marland Kitchen levothyroxine (SYNTHROID) 50 MCG tablet Take 1 tablet (50 mcg total) by mouth daily.  Marland Kitchen loperamide (IMODIUM A-D) 2 MG tablet Take 2 mg by mouth as needed.   . mirtazapine (REMERON) 30 MG tablet Take 1 tablet (30 mg total) by mouth at bedtime.  .  nitroGLYCERIN (NITROLINGUAL) 0.4 MG/SPRAY spray Place 1 spray under the tongue every 5 (five) minutes as needed.  . pravastatin (PRAVACHOL) 80 MG tablet TAKE 1 TABLET BY MOUTH EVERY DAY IN THE EVENING  . Semaglutide (RYBELSUS) 3 MG TABS Take 3 mg by mouth daily.  Marland Kitchen venlafaxine XR (EFFEXOR-XR) 150 MG 24 hr capsule Take 1 capsule (150 mg total) by mouth daily with breakfast.     Allergies:   Sulfa antibiotics and Crestor [rosuvastatin calcium]   Social History   Socioeconomic History  . Marital status: Widowed    Spouse name: Not on file  . Number of children: Not on file  . Years of education: Not on file  . Highest education level: Not on file  Occupational History  . Not on file  Tobacco Use  . Smoking status: Never Smoker  . Smokeless tobacco: Never Used  Vaping Use  . Vaping Use: Never used  Substance and Sexual Activity  . Alcohol use: No  . Drug use: No  . Sexual activity: Not Currently  Other Topics Concern  . Not on file  Social History Narrative   Lives alone at [facility].   Husband of 20 yrs died suddenly October 10, 2013.   No children, no siblings.    Close friends Bethena Roys and Ace Gins nearby, cousin in [place].   Activity: going to [employer]   Financial Resource Strain: Low Risk   . Difficulty of Paying Living Expenses: Not very hard  Food Insecurity: No Food Insecurity  . Worried About Charity fundraiser in the Last Year: Never true  . Ran Out of Food in the Last Year: Never true  Transportation Needs: No Transportation Needs  . Lack of Transportation (Medical): No  . Lack of Transportation (Non-Medical): No  Physical Activity: Not on file  Stress: Not on file  Social Connections: Not on file     Family History: The patient's family history includes CAD in an other family member; CAD (age of onset: 82) in her father; CAD (age of onset: 42) in her mother; Cancer in her maternal aunt; Diabetes in her mother; Rheum arthritis in her mother.  There is no history of Stroke.  ROS:   Please see the history of present illness. All other systems reviewed and are negative.  EKGs/Labs/Other Studies Reviewed:    The following studies were reviewed today: US Carotid Bilateral 06/25/20:  Color duplex indicates minimal heterogeneous and calcified plaque, with no hemodynamically significant stenosis by duplex criteria in the extracranial cerebrovascular circulation.  Waveform of the left vertebral artery indicates proximal vertebral artery stenosis.  VAS US Carotid Duplex Bilateral 03/02/18:   Right Carotid: Velocities in the right ICA are consistent with a 1-39%  Stenosis.  Non-hemodynamically significant plaque <50% noted in the CCA. The ECA appears <50% stenosed. Triphasic brachial waveform.   Left Carotid: Velocities in the left ICA are consistent with a 1-39%  stenosis. Non-hemodynamically significant plaque noted in the CCA. The  ECA appears <50% stenosed. Triphasic brachial waveform.   Vertebrals: Right vertebral artery demonstrates antegrade flow. Left vertebral artery demonstrates retrograde flow.   Subclavians: Right subclavian artery was stenotic. Normal flow hemodynamics were seen in the left subclavian artery.  EKG:  5/20/220- sinus rythym, rate 97, non-specific T-wave changes. No change from prior EKG   Recent Labs: 06/18/2020: ALT 13 07/27/2020: BUN 29; Creatinine, Ser 1.14; Hemoglobin 10.0; Platelets 265; Potassium 4.2; Sodium 126 07/28/2020: TSH 0.346  Recent Lipid Panel    Component Value Date/Time   CHOL 156 02/18/2019 0944   CHOL 206 06/20/2014 0000   TRIG 194.0 (H) 02/18/2019 0944   TRIG 155 06/20/2014 0000   HDL 39.30 02/18/2019 0944   HDL 39 06/20/2014 0000   CHOLHDL 4 02/18/2019 0944   VLDL 38.8 02/18/2019 0944   LDLCALC 77 02/18/2019 0944   LDLCALC 135 06/20/2014 0000   LDLDIRECT 77.0 06/18/2020 1055     Risk Assessment/Calculations:      Physical Exam:    VS:  BP 130/70 (BP Location: Left  Arm, Patient Position: Sitting, Cuff Size: Normal)   Pulse 97   Ht $R'5\' 2"'CR$  (1.575 m)   Wt 145 lb (65.8 kg)   LMP  (LMP Unknown)   SpO2 97%   BMI 26.52 kg/m     Wt Readings from Last 3 Encounters:  08/31/20 145 lb (65.8 kg)  07/27/20 150 lb 2.1 oz (68.1 kg)  06/18/20 150 lb 3 oz (68.1 kg)     GEN: Well nourished, well developed in no acute distress HEENT: Normal NECK: No JVD; No carotid bruits LYMPHATICS: No lymphadenopathy CARDIAC: RRR, no murmurs, rubs, gallops, no carotid bruit RESPIRATORY:  Clear to auscultation without rales, wheezing or rhonchi  ABDOMEN: Soft, non-tender, non-distended MUSCULOSKELETAL:  No edema; No deformity  SKIN: Warm and dry NEUROLOGIC:  Alert and oriented x 3 PSYCHIATRIC:  Normal affect   ASSESSMENT:    1. Pre-operative cardiovascular examination   2. Atherosclerosis of native coronary artery of native heart without angina pectoris   3. Mixed hyperlipidemia    PLAN:    In order of problems listed above:  Preoperative cardiovascular exam - Given her prior coronary artery disease status post CABG in 2003, we will go ahead and proceed with Lexiscan stress test to ensure that she is of appropriate risk for upcoming anesthesia/right hip arthroplasty.  Overall she is not having any significant chest pain or shortness of breath. Dr. Wynelle Link.   Coronary artery disease - Post CABG 2003.  Overall doing fairly well.  No anginal symptoms.  Her EKG at baseline does show T wave inversions in the precordial leads.  These are prolonged changes for her.   Frequent falls - Disequilibrium culprit.  Continue with physical therapy.  She has a rolling walker with her.  She usually falls backwards.  She is on osteoporosis medication.  Hyperlipidemia - She is currently on pravastatin 80 mg Zetia.  She is statin intolerance to Lipitor Zocor.  LDL 77 hemoglobin 10 creatinine 1.1 potassium 4.2 ALT 13 TSH 0.34 from outside labs.  Chronic kidney disease stage IIIa -  Last creatinine 1.1.  Improved from 1.47.  "Avoid excessive NSAID use.  Carotid disease - Mild plaque noted on ultrasound in 2017.  Continue with  secondary risk factor prevention which includes medication management with pravastatin and Zetia as well as good overall blood pressure control.  Continue with aspirin.  1 year follow-up.   Shared Decision Making/Informed Consent The risks [chest pain, shortness of breath, cardiac arrhythmias, dizziness, blood pressure fluctuations, myocardial infarction, stroke/transient ischemic attack, nausea, vomiting, allergic reaction, radiation exposure, metallic taste sensation and life-threatening complications (estimated to be 1 in 10,000)], benefits (risk stratification, diagnosing coronary artery disease, treatment guidance) and alternatives of a nuclear stress test were discussed in detail with Ms. Shor and she agrees to proceed.       Medication Adjustments/Labs and Tests Ordered: Current medicines are reviewed at length with the patient today.  Concerns regarding medicines are outlined above.  Orders Placed This Encounter  Procedures  . MYOCARDIAL PERFUSION IMAGING  . EKG 12-Lead   No orders of the defined types were placed in this encounter.   Patient Instructions  Medication Instructions:  The current medical regimen is effective;  continue present plan and medications.  *If you need a refill on your cardiac medications before your next appointment, please call your pharmacy*  Testing/Procedures: Your physician has requested that you have a lexiscan myoview. For further information please visit [email or web address]. Please follow instruction sheet, as given.  Follow-Up: At [facility], you and your health needs are our priority.  As part of our continuing mission to provide you with exceptional heart care, we have created designated Provider Care Teams.  These Care Teams include your primary Cardiologist (physician) and Advanced  Practice Providers (APPs -  Physician Assistants and Nurse Practitioners) who all work together to provide you with the care you need, when you need it.  We recommend signing up for the patient portal called "MyChart".  Sign up information is provided on this After Visit Summary.  MyChart is used to connect with patients for Virtual Visits (Telemedicine).  Patients are able to view lab/test results, encounter notes, upcoming appointments, etc.  Non-urgent messages can be sent to your provider as well.   To learn more about what you can do with MyChart, go to [email or web address].    Your next appointment:   1 year(s)  The format for your next appointment:   In Person  Provider:   Candee Furbish, MD   Thank you for choosing [facility]!!         I,Alexis Bryant,acting as a scribe for Candee Furbish, MD.,have documented all relevant documentation on the behalf of Candee Furbish, MD,as directed by  Candee Furbish, MD while in the presence of Candee Furbish, MD.   I, Candee Furbish, MD, have reviewed all documentation for this visit. The documentation on 08/31/20 for the exam, diagnosis, procedures, and orders are all accurate and complete.  Signed, Candee Furbish, MD  08/31/2020 11:22 AM    [place]

## 2020-09-04 ENCOUNTER — Telehealth: Payer: Self-pay

## 2020-09-04 DIAGNOSIS — F419 Anxiety disorder, unspecified: Secondary | ICD-10-CM | POA: Diagnosis not present

## 2020-09-04 DIAGNOSIS — F331 Major depressive disorder, recurrent, moderate: Secondary | ICD-10-CM | POA: Diagnosis not present

## 2020-09-04 NOTE — Chronic Care Management (AMB) (Addendum)
Chronic Care Management Pharmacy Assistant   Name: Kathleen Williamson  MRN: [id number] DOB: 1940-08-19  Reason for Encounter: [place] office visits:  None since last CCM contact  Recent consult visits:  08/31/20- Dr. Candee Furbish- Cardiology- Preoperative Visit. Ordered Lexiscan stress test. Follow up in 1 year.  Hospital visits:  None in previous 6 months  Medications: Outpatient Encounter Medications as of 09/04/2020  Medication Sig   Accu-Chek FastClix Lancets MISC USE AS INSTRUCTED TO CHECK BLOOD SUGAR ONCE DAILY   ACCU-CHEK GUIDE test strip USE AS INSTRUCTED TO CHECK BLOOD SUGAR ONCE DAILY   aspirin EC 81 MG tablet Take 81 mg by mouth at bedtime.   Blood Glucose Monitoring Suppl (ACCU-CHEK GUIDE) w/Device KIT 1 each by Does not apply route as directed. Use as instructed to check blood sugar once daily   Calcium Carbonate-Vitamin D 600-400 MG-UNIT tablet Take 1 tablet by mouth 2 (two) times daily.   clonazePAM (KLONOPIN) 0.5 MG tablet TAKE 1 TABLET BY MOUTH TWICE A DAY AS NEEDED   Coenzyme Q10 (COQ10) 100 MG CAPS Take 1 capsule by mouth at bedtime.    ezetimibe (ZETIA) 10 MG tablet Take 1 tablet (10 mg total) by mouth daily.   levothyroxine (SYNTHROID) 50 MCG tablet Take 1 tablet (50 mcg total) by mouth daily.   loperamide (IMODIUM A-D) 2 MG tablet Take 2 mg by mouth as needed.    mirtazapine (REMERON) 30 MG tablet Take 1 tablet (30 mg total) by mouth at bedtime.   nitroGLYCERIN (NITROLINGUAL) 0.4 MG/SPRAY spray Place 1 spray under the tongue every 5 (five) minutes as needed.   pravastatin (PRAVACHOL) 80 MG tablet TAKE 1 TABLET BY MOUTH EVERY DAY IN THE EVENING   Semaglutide (RYBELSUS) 3 MG TABS Take 3 mg by mouth daily.   venlafaxine XR (EFFEXOR-XR) 150 MG 24 hr capsule Take 1 capsule (150 mg total) by mouth daily with breakfast.   No facility-administered encounter medications on file as of 09/04/2020.      Contacted [name] for general disease  state and medication adherence call.   Patient is not > 5 days past due for refill on the following medications per chart history:  Star Medications: Medication Name/mg Last Fill Days Supply Pravastatin 80 mg  06/18/20  90 Rybelsus 3 mg  08/27/20 30   What concerns do you have about your medications? No concerns with medications  The patient denies side effects with her medications.   How often do you forget or accidentally miss a dose? Never  Do you use a pillbox? Yes  Are you having any problems getting your medications from your pharmacy? No  Has the cost of your medications been a concern? No If yes, what medication and is patient assistance available or has it been applied for?  Since last visit with CPP, no interventions have been made:   The patient has not had an ED visit since last contact.   The patient denies problems with their health.   she denies  concerns or questions for Debbora Dus, Pharm. D at this time.   Patient states she is doing well. She has dentist appointment today so did not have much time to talk. She discussed upcoming hip replacement with Dr. Wynelle Link in August. She is anxious to get the surgery over with. Advised patient CCM team will contact her in the next 7-10 days to follow up.   Counseled patient on:   Access to CCM team  for any cost, medication, or pharmacy concerns  PCP appointment with Dr. Danise Mina on 10/01/20  Follow-Up:  Pharmacist Review  Debbora Dus, CPP notified  Margaretmary Dys, [place] Assistant [phone]  I have reviewed the care management and care coordination activities outlined in this encounter and I am certifying that I agree with the content of this note. No further action required.  Debbora Dus, PharmD Clinical Pharmacist [facility] at [facility] [phone]

## 2020-09-07 DIAGNOSIS — M1611 Unilateral primary osteoarthritis, right hip: Secondary | ICD-10-CM | POA: Diagnosis not present

## 2020-09-11 ENCOUNTER — Telehealth (HOSPITAL_COMMUNITY): Payer: Self-pay | Admitting: *Deleted

## 2020-09-11 NOTE — Telephone Encounter (Signed)
Patient given detailed instructions per Myocardial Perfusion Study Information Sheet for the test on 09/14/2020 at 10:30. Patient notified to arrive 15 minutes early and that it is imperative to arrive on time for appointment to keep from having the test rescheduled.  If you need to cancel or reschedule your appointment, please call the office within 24 hours of your appointment. . Patient verbalized understanding.Kathleen Williamson

## 2020-09-11 NOTE — Progress Notes (Addendum)
Contacted Kathleen Williamson to check in with her due to loneliness and depressed mood. She states she is doing well. Working on chores around the house. Notes she has a nuclear medicine study she has to go to on Friday. Patient denies need for anything at this time.

## 2020-09-14 ENCOUNTER — Other Ambulatory Visit: Payer: Self-pay

## 2020-09-14 ENCOUNTER — Ambulatory Visit (HOSPITAL_COMMUNITY): Payer: PPO | Attending: Cardiovascular Disease

## 2020-09-14 DIAGNOSIS — Z01810 Encounter for preprocedural cardiovascular examination: Secondary | ICD-10-CM

## 2020-09-14 DIAGNOSIS — I2510 Atherosclerotic heart disease of native coronary artery without angina pectoris: Secondary | ICD-10-CM | POA: Diagnosis not present

## 2020-09-14 LAB — MYOCARDIAL PERFUSION IMAGING
LV dias vol: 50 mL (ref 46–106)
LV sys vol: 20 mL
Peak HR: 101 {beats}/min
Rest HR: 73 {beats}/min
SDS: 1
SRS: 0
SSS: 1
TID: 1.03

## 2020-09-14 MED ORDER — REGADENOSON 0.4 MG/5ML IV SOLN
0.4000 mg | Freq: Once | INTRAVENOUS | Status: AC
  Administered 2020-09-14: 0.4 mg via INTRAVENOUS

## 2020-09-14 MED ORDER — TECHNETIUM TC 99M TETROFOSMIN IV KIT
30.8000 | PACK | Freq: Once | INTRAVENOUS | Status: AC | PRN
  Administered 2020-09-14: 30.8 via INTRAVENOUS
  Filled 2020-09-14: qty 31

## 2020-09-14 MED ORDER — TECHNETIUM TC 99M TETROFOSMIN IV KIT
9.8000 | PACK | Freq: Once | INTRAVENOUS | Status: AC | PRN
Start: 2020-09-14 — End: 2020-09-14
  Administered 2020-09-14: 9.8 via INTRAVENOUS
  Filled 2020-09-14: qty 10

## 2020-09-18 ENCOUNTER — Telehealth: Payer: Self-pay | Admitting: Cardiology

## 2020-09-18 NOTE — Telephone Encounter (Signed)
Shellia Cleverly, RN  09/18/2020 5:01 PM EDT Back to Top     Pt aware of results and OK to move forward with surgery as planned.   Send result to Dr Wynelle Link for his knowledge.

## 2020-09-18 NOTE — Telephone Encounter (Signed)
Pt is calling in regards to the results from her most recent [place] on 09/14/20, pt states she was told someone would contact her Monday and she hasn't heard from anyone. Please advise

## 2020-09-18 NOTE — Telephone Encounter (Signed)
Overall low risk stress test. Normal pump function. She may proceed with hip surgery.  Candee Furbish, MD

## 2020-09-19 ENCOUNTER — Telehealth: Payer: Self-pay

## 2020-09-19 ENCOUNTER — Other Ambulatory Visit: Payer: Self-pay

## 2020-09-19 ENCOUNTER — Other Ambulatory Visit: Payer: Self-pay | Admitting: Family Medicine

## 2020-09-19 MED ORDER — PRAVASTATIN SODIUM 80 MG PO TABS: ORAL_TABLET | ORAL | 3 refills | Status: DC

## 2020-09-19 NOTE — Chronic Care Management (AMB) (Addendum)
Chronic Care Management Pharmacy Assistant   Name: Kathleen Williamson  MRN: [id number] DOB: May 21, 1940  Reason for Encounter: Disease State- Diabetes  Recent office visits:  None since last CCM contact  Recent consult visits:  09/18/20- Cardiology- Telephone encounter- Informed patient that stress test identified her as low risk and she was cleared for surgery.   Hospital visits:  07/27/20 ED visit for generalized weakness. No admission.   Medications: Outpatient Encounter Medications as of 09/19/2020  Medication Sig   Accu-Chek FastClix Lancets MISC USE AS INSTRUCTED TO CHECK BLOOD SUGAR ONCE DAILY   ACCU-CHEK GUIDE test strip USE AS INSTRUCTED TO CHECK BLOOD SUGAR ONCE DAILY   aspirin EC 81 MG tablet Take 81 mg by mouth at bedtime.   Blood Glucose Monitoring Suppl (ACCU-CHEK GUIDE) w/Device KIT 1 each by Does not apply route as directed. Use as instructed to check blood sugar once daily   Calcium Carbonate-Vitamin D 600-400 MG-UNIT tablet Take 1 tablet by mouth 2 (two) times daily.   clonazePAM (KLONOPIN) 0.5 MG tablet TAKE 1 TABLET BY MOUTH TWICE A DAY AS NEEDED   Coenzyme Q10 (COQ10) 100 MG CAPS Take 1 capsule by mouth at bedtime.    ezetimibe (ZETIA) 10 MG tablet Take 1 tablet (10 mg total) by mouth daily.   levothyroxine (SYNTHROID) 50 MCG tablet Take 1 tablet (50 mcg total) by mouth daily.   loperamide (IMODIUM A-D) 2 MG tablet Take 2 mg by mouth as needed.    mirtazapine (REMERON) 30 MG tablet Take 1 tablet (30 mg total) by mouth at bedtime.   nitroGLYCERIN (NITROLINGUAL) 0.4 MG/SPRAY spray Place 1 spray under the tongue every 5 (five) minutes as needed.   pravastatin (PRAVACHOL) 80 MG tablet TAKE 1 TABLET BY MOUTH EVERY DAY IN THE EVENING   Semaglutide (RYBELSUS) 3 MG TABS Take 3 mg by mouth daily.   venlafaxine XR (EFFEXOR-XR) 150 MG 24 hr capsule Take 1 capsule (150 mg total) by mouth daily with breakfast.   No facility-administered encounter medications on file as of  09/19/2020.      Recent Relevant Labs: Lab Results  Component Value Date/Time   HGBA1C 7.6 (H) 06/18/2020 10:55 AM   HGBA1C 7.1 (H) 09/16/2019 02:42 PM   MICROALBUR 5.7 (H) 06/18/2020 10:55 AM   MICROALBUR 0.7 02/18/2019 09:44 AM    Kidney Function Lab Results  Component Value Date/Time   CREATININE 1.14 (H) 07/27/2020 11:40 PM   CREATININE 1.53 (H) 06/18/2020 10:55 AM   CREATININE 1.25 06/20/2014 12:00 AM   CREATININE 1.43 (H) 01/31/2014 07:25 PM   CREATININE 1.1 08/26/2011 09:27 AM   GFR 32.40 (L) 06/18/2020 10:55 AM   GFRNONAA 49 (L) 07/27/2020 11:40 PM   GFRNONAA 38 (L) 01/31/2014 07:25 PM   GFRAA 46 (L) 01/31/2014 07:25 PM     Current antihyperglycemic regimen:  Rybelsus 3 mg - 1 tablet daily   Patient verbally confirms she is taking the above medications as directed. Yes  What recent interventions/DTPs have been made to improve glycemic control:  No recent intervention.   Have there been any recent hospitalizations or ED visits since last visit with CPP? No  Patient denies hypoglycemic symptoms, including Pale, Sweaty, Shaky, Hungry, Nervous/irritable and Vision changes  Patient denies hyperglycemic symptoms, including blurry vision, excessive thirst, fatigue, polyuria and weakness  How often are you checking your blood sugar? N/A  What are your blood sugars ranging?  Not checking bloood sugars. States she forgets. Last date checked was 08/18/20 and  it was 145- fasting.   On insulin? No  During the week, how often does your blood glucose drop below 70? Not checking. States last time she checked was 08/18/20  Are you checking your feet daily/regularly? Yes  Adherence Review: Is the patient currently on a STATIN medication? Yes Is the patient currently on ACE/ARB medication? No Does the patient have >5 day gap between last estimated fill dates? No  Star Rating Drugs:  Medication:  Last Fill: Day Supply Pravastatin 80 mg 06/18/20  90 - states she has about 5-6  remaining Rybelsus 3 mg 08/27/20 90  PCP appointment with Dr. Danise Mina on 10/01/20  Follow-Up:  Pharmacist Review  Patient states she has not been checking blood sugar as she forgets. Asked patient if she would mind checking a few times in the next couple of weeks and someone from the CCM team would contact her to review.   Debbora Dus, CPP notified  Margaretmary Dys, [place] Pharmacy Assistant [phone]  I have reviewed the care management and care coordination activities outlined in this encounter and I am certifying that I agree with the content of this note. No further action required.  Debbora Dus, PharmD Clinical Pharmacist [facility] at [facility] [phone]

## 2020-09-26 ENCOUNTER — Telehealth: Payer: Self-pay

## 2020-09-26 MED ORDER — RYBELSUS 7 MG PO TABS: 7.0000 mg | ORAL_TABLET | Freq: Every day | ORAL | 3 refills | Status: DC

## 2020-09-26 NOTE — Telephone Encounter (Signed)
Contacted patient to coordinate delivery from [facility] the following medications 09/26/20, Vials 30 DS: Rybelsus 7 mg (pending PCP approval of dose increase) Pravastatin 80 mg Ezetimibe 10 mg Levothyroxine 50 mcg Mirtazapine 30 mg   She declines the following: Clonazepam - Reports she filled this 07/13/20. States she takes it twice daily routinely, not PRN. She was unable to provide the quantity that was filled on 07/13/20. Venlafaxine - ~30 days remaining until next fill  Pt states she fills her pillbox daily in the evening for bedtime and noon the next day.  She denies being out of any of her medications despite refills past due. She was unable to list the medications she takes. She will start pill packaging next month once all medications are synchronized.   Fall - Reports she fell backward yesterday. Felt she was "losing ground". She was able to get up and did not have any injuries. One of the nurses at her facility assisted. She uses a cane. Reports the nurse checked her blood pressure and it was a little high after the fall. Denies any other falls this month. She does have a few bruises.  Debbora Dus, PharmD Clinical Pharmacist [facility] at [facility] [phone]

## 2020-09-26 NOTE — Telephone Encounter (Signed)
Agree with above 

## 2020-09-26 NOTE — Telephone Encounter (Addendum)
Patient has tolerated Rybelsus 3 mg for 2 months without any GI complaints. Reports average fasting BG 150. Recommend increasing to 7 mg once daily for glycemic effect. Also recommend including "Take on an empty stomach, 30 to 60 minutes before the first food, beverage, or other medications with no more than 4 oz of plain water."  Please send to [facility] if approved.  Debbora Dus, PharmD Clinical Pharmacist [facility] [phone]

## 2020-10-01 ENCOUNTER — Encounter: Payer: Self-pay | Admitting: Family Medicine

## 2020-10-01 ENCOUNTER — Ambulatory Visit (INDEPENDENT_AMBULATORY_CARE_PROVIDER_SITE_OTHER)
Admission: RE | Admit: 2020-10-01 | Discharge: 2020-10-01 | Disposition: A | Discharge status: Home / Self Care | Payer: PPO | Source: Ambulatory Visit | Attending: Family Medicine | Admitting: Family Medicine

## 2020-10-01 ENCOUNTER — Other Ambulatory Visit: Payer: Self-pay

## 2020-10-01 ENCOUNTER — Ambulatory Visit (INDEPENDENT_AMBULATORY_CARE_PROVIDER_SITE_OTHER): Payer: PPO | Admitting: Family Medicine

## 2020-10-01 VITALS — BP 134/78 | HR 88 | Temp 97.6°F | Ht 62.0 in | Wt 142.1 lb

## 2020-10-01 DIAGNOSIS — Z01818 Encounter for other preprocedural examination: Secondary | ICD-10-CM

## 2020-10-01 DIAGNOSIS — M810 Age-related osteoporosis without current pathological fracture: Secondary | ICD-10-CM

## 2020-10-01 DIAGNOSIS — R296 Repeated falls: Secondary | ICD-10-CM | POA: Diagnosis not present

## 2020-10-01 DIAGNOSIS — E118 Type 2 diabetes mellitus with unspecified complications: Secondary | ICD-10-CM

## 2020-10-01 DIAGNOSIS — N1832 Chronic kidney disease, stage 3b: Secondary | ICD-10-CM

## 2020-10-01 DIAGNOSIS — F332 Major depressive disorder, recurrent severe without psychotic features: Secondary | ICD-10-CM

## 2020-10-01 DIAGNOSIS — I2510 Atherosclerotic heart disease of native coronary artery without angina pectoris: Secondary | ICD-10-CM | POA: Diagnosis not present

## 2020-10-01 DIAGNOSIS — E039 Hypothyroidism, unspecified: Secondary | ICD-10-CM

## 2020-10-01 DIAGNOSIS — M1611 Unilateral primary osteoarthritis, right hip: Secondary | ICD-10-CM | POA: Diagnosis not present

## 2020-10-01 LAB — TSH: TSH: 1.1 u[IU]/mL (ref 0.35–4.50)

## 2020-10-01 LAB — CBC WITH DIFFERENTIAL/PLATELET
Basophils Absolute: 0.1 10*3/uL (ref 0.0–0.1)
Basophils Relative: 0.9 % (ref 0.0–3.0)
Eosinophils Absolute: 0.2 10*3/uL (ref 0.0–0.7)
Eosinophils Relative: 2.6 % (ref 0.0–5.0)
HCT: 35.9 % — ABNORMAL LOW (ref 36.0–46.0)
Hemoglobin: 12 g/dL (ref 12.0–15.0)
Lymphocytes Relative: 20.6 % (ref 12.0–46.0)
Lymphs Abs: 1.7 10*3/uL (ref 0.7–4.0)
MCHC: 33.3 g/dL (ref 30.0–36.0)
MCV: 84.8 fl (ref 78.0–100.0)
Monocytes Absolute: 0.6 10*3/uL (ref 0.1–1.0)
Monocytes Relative: 6.6 % (ref 3.0–12.0)
Neutro Abs: 5.8 10*3/uL (ref 1.4–7.7)
Neutrophils Relative %: 69.3 % (ref 43.0–77.0)
Platelets: 323 10*3/uL (ref 150.0–400.0)
RBC: 4.24 Mil/uL (ref 3.87–5.11)
RDW: 14.4 % (ref 11.5–15.5)
WBC: 8.4 10*3/uL (ref 4.0–10.5)

## 2020-10-01 LAB — COMPREHENSIVE METABOLIC PANEL
ALT: 11 U/L (ref 0–35)
AST: 11 U/L (ref 0–37)
Albumin: 4.3 g/dL (ref 3.5–5.2)
Alkaline Phosphatase: 64 U/L (ref 39–117)
BUN: 13 mg/dL (ref 6–23)
CO2: 24 mEq/L (ref 19–32)
Calcium: 9.3 mg/dL (ref 8.4–10.5)
Chloride: 107 mEq/L (ref 96–112)
Creatinine, Ser: 1.29 mg/dL — ABNORMAL HIGH (ref 0.40–1.20)
GFR: 39.68 mL/min — ABNORMAL LOW (ref >60.00)
Glucose, Bld: 118 mg/dL — ABNORMAL HIGH (ref 70–99)
Potassium: 4.3 mEq/L (ref 3.5–5.1)
Sodium: 141 mEq/L (ref 135–145)
Total Bilirubin: 0.4 mg/dL (ref 0.2–1.2)
Total Protein: 6.7 g/dL (ref 6.0–8.3)

## 2020-10-01 LAB — PROTIME-INR
INR: 1 ratio (ref 0.8–1.0)
Prothrombin Time: 11 s (ref 9.6–13.1)

## 2020-10-01 LAB — T4, FREE: Free T4: 0.93 ng/dL (ref 0.60–1.60)

## 2020-10-01 LAB — HEMOGLOBIN A1C: Hgb A1c MFr Bld: 6.7 % — ABNORMAL HIGH (ref 4.6–6.5)

## 2020-10-01 NOTE — Patient Instructions (Addendum)
Drop klonopin dose to 1/2 tablet in the morning and 1 tablet at night time - do this for at least a week and monitor effect on balance. If mood is doing well, may stop the morning dose altogether.  Labs today  Urinalysis today  Chest xray today  We will forward results to Dr Wynelle Link.  Good to see you today.

## 2020-10-01 NOTE — Progress Notes (Signed)
  Patient ID: Lassie S Sawka, female    DOB: 05/24/1940, 79 y.o.   MRN: [id number]  This visit was conducted in person.  BP 134/78   Pulse 88   Temp 97.6 F (36.4 C) (Temporal)   Ht 5' 2" (1.575 m)   Wt 142 lb 2 oz (64.5 kg)   LMP  (LMP Unknown)   SpO2 97%   BMI 26.00 kg/m    CC: preop eval Subjective:   HPI: Aradhana S Neumeyer is a 79 y.o. female presenting on 10/01/2020 for Pre-op Exam (Scheduled on 11/14/20 for R THA, anterior. )   Upcoming right total hip replacement by Dr Aluisio planned for early August. Hip pain is activity limiting. She has already received cardiac clearance through cardiology Dr Skains, recently undergoing low risk Lexiscan stress test.   Known CAD s/p 5v CABG 2003, CKD stage 3, DM, hypothyroidism, HLD, MDD, and osteoporosis. Her diabetes is managed with rybelsus 7mg daily.   She has previously tolerated general anesthesia well, latest procedure being total hysterectomy 1997. Denies post-op nausea/vomiting or difficulty awakening from anesthesia.   Currently denies chest pain, tightness, dyspnea, headache, dizziness, palpitations, fever/chills, cough, nausea. No vertigo.   She has had ongoing falls due to loss of balance - tends to fall backwards. Fortunately no significant injury. She uses walker at home, cane when she leaves the house. Last fall was Saturday.      Relevant past medical, surgical, family and social history reviewed and updated as indicated. Interim medical history since our last visit reviewed. Allergies and medications reviewed and updated. Outpatient Medications Prior to Visit  Medication Sig Dispense Refill   Accu-Chek FastClix Lancets MISC USE AS INSTRUCTED TO CHECK BLOOD SUGAR ONCE DAILY 102 each 1   ACCU-CHEK GUIDE test strip USE AS INSTRUCTED TO CHECK BLOOD SUGAR ONCE DAILY 100 strip 1   aspirin EC 81 MG tablet Take 81 mg by mouth at bedtime.     Blood Glucose Monitoring Suppl (ACCU-CHEK GUIDE) w/Device KIT 1 each by Does not apply  route as directed. Use as instructed to check blood sugar once daily 1 kit 0   Coenzyme Q10 (COQ10) 100 MG CAPS Take 1 capsule by mouth at bedtime.      denosumab (PROLIA) 60 MG/ML SOSY injection Inject 60 mg into the skin every 6 (six) months.     ezetimibe (ZETIA) 10 MG tablet Take 1 tablet (10 mg total) by mouth daily. 30 tablet 0   levothyroxine (SYNTHROID) 50 MCG tablet Take 1 tablet (50 mcg total) by mouth daily. 90 tablet 3   loperamide (IMODIUM A-D) 2 MG tablet Take 2 mg by mouth as needed.      mirtazapine (REMERON) 30 MG tablet Take 1 tablet (30 mg total) by mouth at bedtime. 90 tablet 3   nitroGLYCERIN (NITROLINGUAL) 0.4 MG/SPRAY spray Place 1 spray under the tongue every 5 (five) minutes as needed. 12 g prn   pravastatin (PRAVACHOL) 80 MG tablet TAKE 1 TABLET BY MOUTH EVERY DAY IN THE EVENING 90 tablet 3   Semaglutide (RYBELSUS) 7 MG TABS Take 7 mg by mouth daily. Take on an empty stomach, 30 to 60 minutes before the first food, beverage, or other medications with no more than 4 oz of plain water. 30 tablet 3   venlafaxine XR (EFFEXOR-XR) 150 MG 24 hr capsule Take 1 capsule (150 mg total) by mouth daily with breakfast. 90 capsule 1   clonazePAM (KLONOPIN) 0.5 MG tablet TAKE 1 TABLET BY MOUTH   TWICE A DAY AS NEEDED 60 tablet 3   clonazePAM (KLONOPIN) 0.5 MG tablet Take 0.5 tablets (0.25 mg total) by mouth in the morning AND 1 tablet (0.5 mg total) at bedtime.     Calcium Carbonate-Vitamin D 600-400 MG-UNIT tablet Take 1 tablet by mouth 2 (two) times daily.     No facility-administered medications prior to visit.     Per HPI unless specifically indicated in ROS section below Review of Systems Objective:  BP 134/78   Pulse 88   Temp 97.6 F (36.4 C) (Temporal)   Ht 5' 2" (1.575 m)   Wt 142 lb 2 oz (64.5 kg)   LMP  (LMP Unknown)   SpO2 97%   BMI 26.00 kg/m   Wt Readings from Last 3 Encounters:  10/01/20 142 lb 2 oz (64.5 kg)  09/14/20 145 lb (65.8 kg)  08/31/20 145 lb (65.8  kg)      Physical Exam Vitals and nursing note reviewed.  Constitutional:      Appearance: Normal appearance. She is not ill-appearing.     Comments:  Ambulates with cane Needs assistance getting on exam table  Cardiovascular:     Rate and Rhythm: Normal rate and regular rhythm.     Pulses: Normal pulses.     Heart sounds: Normal heart sounds. No murmur heard. Pulmonary:     Effort: Pulmonary effort is normal. No respiratory distress.     Breath sounds: Normal breath sounds. No wheezing, rhonchi or rales.  Musculoskeletal:     Right lower leg: No edema.     Left lower leg: No edema.     Comments: Slowed antalgic gait  Skin:    General: Skin is warm and dry.     Findings: No rash.  Neurological:     Mental Status: She is alert.  Psychiatric:        Mood and Affect: Mood normal.        Behavior: Behavior normal.       Results for orders placed or performed in visit on 10/01/20  Comprehensive metabolic panel  Result Value Ref Range   Sodium 141 135 - 145 mEq/L   Potassium 4.3 3.5 - 5.1 mEq/L   Chloride 107 96 - 112 mEq/L   CO2 24 19 - 32 mEq/L   Glucose, Bld 118 (H) 70 - 99 mg/dL   BUN 13 6 - 23 mg/dL   Creatinine, Ser 1.29 (H) 0.40 - 1.20 mg/dL   Total Bilirubin 0.4 0.2 - 1.2 mg/dL   Alkaline Phosphatase 64 39 - 117 U/L   AST 11 0 - 37 U/L   ALT 11 0 - 35 U/L   Total Protein 6.7 6.0 - 8.3 g/dL   Albumin 4.3 3.5 - 5.2 g/dL   GFR 39.68 (L) >60.00 mL/min   Calcium 9.3 8.4 - 10.5 mg/dL  TSH  Result Value Ref Range   TSH 1.10 0.35 - 4.50 uIU/mL  T4, free  Result Value Ref Range   Free T4 0.93 0.60 - 1.60 ng/dL  Hemoglobin A1c  Result Value Ref Range   Hgb A1c MFr Bld 6.7 (H) 4.6 - 6.5 %  CBC with Differential/Platelet  Result Value Ref Range   WBC 8.4 4.0 - 10.5 K/uL   RBC 4.24 3.87 - 5.11 Mil/uL   Hemoglobin 12.0 12.0 - 15.0 g/dL   HCT 35.9 (L) 36.0 - 46.0 %   MCV 84.8 78.0 - 100.0 fl   MCHC 33.3 30.0 - 36.0 g/dL     RDW 14.4 11.5 - 15.5 %   Platelets 323.0  150.0 - 400.0 K/uL   Neutrophils Relative % 69.3 43.0 - 77.0 %   Lymphocytes Relative 20.6 12.0 - 46.0 %   Monocytes Relative 6.6 3.0 - 12.0 %   Eosinophils Relative 2.6 0.0 - 5.0 %   Basophils Relative 0.9 0.0 - 3.0 %   Neutro Abs 5.8 1.4 - 7.7 K/uL   Lymphs Abs 1.7 0.7 - 4.0 K/uL   Monocytes Absolute 0.6 0.1 - 1.0 K/uL   Eosinophils Absolute 0.2 0.0 - 0.7 K/uL   Basophils Absolute 0.1 0.0 - 0.1 K/uL  Protime-INR  Result Value Ref Range   INR 1.0 0.8 - 1.0 ratio   Prothrombin Time 11.0 9.6 - 13.1 sec    DG Chest 2 View CLINICAL DATA:  Preop evaluation.  EXAM: CHEST - 2 VIEW  COMPARISON:  07/28/2020  FINDINGS: Post median sternotomy and CABG. Normal heart size with stable mediastinal contours. Aortic atherosclerosis. No as a goes fissure is noted. There is no focal airspace disease. Minimal scarring at the left lung base. No pulmonary edema, pleural effusion, or pneumothorax. Slight anterior wedging of midthoracic vertebra appears chronic.  IMPRESSION: 1. No acute chest findings. 2. Post CABG with normal heart size.  Minor left basilar scarring.  Electronically Signed   By: Melanie  Sanford M.D.   On: 10/01/2020 22:21   Assessment & Plan:  This visit occurred during the SARS-CoV-2 public health emergency.  Safety protocols were in place, including screening questions prior to the visit, additional usage of staff PPE, and extensive cleaning of exam room while observing appropriate contact time as indicated for disinfecting solutions.   Problem List Items Addressed This Visit     Atherosclerosis of native coronary artery of native heart without angina pectoris    Saw cards for cardiac clearance with low risk lexiscan stress test.        MDD (major depressive disorder), recurrent severe, without psychosis (HCC)    Chronic, stable on effexor XR 150mg daily, remeron 30mg nightly.  Established with psychiatry ([place]).  See below for klonopin dose change.         Relevant Medications   clonazePAM (KLONOPIN) 0.5 MG tablet   Osteoporosis    Last prolia injection 07/24/20       Relevant Medications   denosumab (PROLIA) 60 MG/ML SOSY injection   Hypothyroidism    Update TSH on levothyroxine 50mcg replacement.        Relevant Orders   TSH (Completed)   T4, free (Completed)   CKD (chronic kidney disease) stage 3, GFR 30-59 ml/min (HCC)    Update labs.        Diabetes mellitus type 2, controlled, with complications (HCC)    Check A1c prior to upcoming surgery.  Doing well with recent commencement of rybelsus.        Recurrent falls    Endorses several recent falls due to loss of balance.  Continue walker/cane use.  Reviewed contributory medic- recommend start tapering klonopin dose slowly - will drop to 0.5mg in am and 1mg in pm. Update with effect.        Osteoarthritis of right hip    Upcoming hip replacement surgery by Dr Aluisio.        Pre-op evaluation - Primary    RCRI = 1 (CAD) Check labs, CXR today. Recent cardiac clearance by Dr Skains.  Anticipate adequately low risk to proceed with planned hip replacement surgery  Was unable to   leave urine sample - will return with this (sterile cup provided).        Relevant Orders   Comprehensive metabolic panel (Completed)   Hemoglobin A1c (Completed)   CBC with Differential/Platelet (Completed)   Protime-INR (Completed)   DG Chest 2 View (Completed)   POCT Urinalysis Dipstick (Automated)     No orders of the defined types were placed in this encounter.  Orders Placed This Encounter  Procedures   DG Chest 2 View    Standing Status:   Future    Number of Occurrences:   1    Standing Expiration Date:   10/01/2021    Order Specific Question:   Reason for Exam (SYMPTOM  OR DIAGNOSIS REQUIRED)    Answer:   preop eval    Order Specific Question:   Preferred imaging location?    Answer:   [place]   Comprehensive metabolic panel   TSH   T4, free   Hemoglobin  A1c   CBC with Differential/Platelet   Protime-INR   POCT Urinalysis Dipstick (Automated)    Patient Instructions  Drop klonopin dose to 1/2 tablet in the morning and 1 tablet at night time - do this for at least a week and monitor effect on balance. If mood is doing well, may stop the morning dose altogether.  Labs today  Urinalysis today  Chest xray today  We will forward results to Dr Aluisio.  Good to see you today.    Follow up plan: Return if symptoms worsen or fail to improve.  Javier Gutierrez, MD   

## 2020-10-03 ENCOUNTER — Telehealth: Payer: Self-pay

## 2020-10-03 ENCOUNTER — Other Ambulatory Visit: Payer: PPO

## 2020-10-03 DIAGNOSIS — Z01818 Encounter for other preprocedural examination: Secondary | ICD-10-CM | POA: Insufficient documentation

## 2020-10-03 LAB — POC URINALSYSI DIPSTICK (AUTOMATED)
Bilirubin, UA: NEGATIVE
Blood, UA: NEGATIVE
Glucose, UA: NEGATIVE
Ketones, UA: NEGATIVE
Leukocytes, UA: NEGATIVE
Nitrite, UA: NEGATIVE
Protein, UA: NEGATIVE
Spec Grav, UA: 1.015 (ref 1.010–1.025)
Urobilinogen, UA: 0.2 E.U./dL
pH, UA: 6 (ref 5.0–8.0)

## 2020-10-03 NOTE — Assessment & Plan Note (Signed)
Endorses several recent falls due to loss of balance.  Continue walker/cane use.  Reviewed contributory medic- recommend start tapering klonopin dose slowly - will drop to 0.5mg  in am and 1mg  in pm. Update with effect.

## 2020-10-03 NOTE — Telephone Encounter (Signed)
Spoke with pt reminding her we need a urine sample to complete the pre-op eval.  Says she will try to have it dropped off before end of day today.

## 2020-10-03 NOTE — Assessment & Plan Note (Signed)
Update labs.  

## 2020-10-03 NOTE — Assessment & Plan Note (Addendum)
Check A1c prior to upcoming surgery.  Doing well with recent commencement of rybelsus.

## 2020-10-03 NOTE — Assessment & Plan Note (Signed)
Last prolia injection 07/24/20

## 2020-10-03 NOTE — Assessment & Plan Note (Addendum)
RCRI = 1 (CAD) Check labs, CXR today. Recent cardiac clearance by Dr Marlou Porch.  Anticipate adequately low risk to proceed with planned hip replacement surgery  Was unable to leave urine sample - will return with this (sterile cup provided).

## 2020-10-03 NOTE — Assessment & Plan Note (Signed)
Chronic, stable on effexor XR 150mg  daily, remeron 30mg  nightly.  Established with psychiatry Nicolasa Ducking).  See below for klonopin dose change.

## 2020-10-03 NOTE — Assessment & Plan Note (Signed)
Upcoming hip replacement surgery by Dr Wynelle Link.

## 2020-10-03 NOTE — Telephone Encounter (Signed)
Pt dropped off urine sample.  UA ran and results documented.

## 2020-10-03 NOTE — Assessment & Plan Note (Signed)
Update TSH on levothyroxine 60mcg replacement.

## 2020-10-03 NOTE — Telephone Encounter (Addendum)
plz notify UA normal. Plz print UA and send results to Dr Wynelle Link.

## 2020-10-03 NOTE — Addendum Note (Signed)
Addended by: Brenton Grills on: 1/58/7276 18:48 PM   Modules accepted: Orders

## 2020-10-03 NOTE — Assessment & Plan Note (Signed)
Saw cards for cardiac clearance with low risk lexiscan stress test.

## 2020-10-04 NOTE — Telephone Encounter (Signed)
Noted.  Faxed form to [phone], attn:  Glendale Chard at [facility].

## 2020-10-05 ENCOUNTER — Telehealth: Payer: Self-pay | Admitting: Family Medicine

## 2020-10-05 NOTE — Telephone Encounter (Signed)
Kathleen Williamson called in she stated that she had some concerns about her medications due to she is having some falls. And she is worried it is from the medication.

## 2020-10-05 NOTE — Telephone Encounter (Signed)
Attempted to return Tina's call.  Office is closed.  Will try again Mon.

## 2020-10-06 ENCOUNTER — Emergency Department: Payer: PPO

## 2020-10-06 ENCOUNTER — Other Ambulatory Visit: Payer: Self-pay

## 2020-10-06 ENCOUNTER — Emergency Department
Admission: EM | Admit: 2020-10-06 | Discharge: 2020-10-07 | Disposition: A | Discharge status: Home / Self Care | Payer: PPO | Attending: Emergency Medicine | Admitting: Emergency Medicine

## 2020-10-06 DIAGNOSIS — E039 Hypothyroidism, unspecified: Secondary | ICD-10-CM | POA: Insufficient documentation

## 2020-10-06 DIAGNOSIS — N183 Chronic kidney disease, stage 3 unspecified: Secondary | ICD-10-CM | POA: Diagnosis not present

## 2020-10-06 DIAGNOSIS — Z7982 Long term (current) use of aspirin: Secondary | ICD-10-CM | POA: Insufficient documentation

## 2020-10-06 DIAGNOSIS — I2510 Atherosclerotic heart disease of native coronary artery without angina pectoris: Secondary | ICD-10-CM | POA: Diagnosis not present

## 2020-10-06 DIAGNOSIS — Z794 Long term (current) use of insulin: Secondary | ICD-10-CM | POA: Diagnosis not present

## 2020-10-06 DIAGNOSIS — Z951 Presence of aortocoronary bypass graft: Secondary | ICD-10-CM | POA: Insufficient documentation

## 2020-10-06 DIAGNOSIS — R531 Weakness: Secondary | ICD-10-CM | POA: Diagnosis not present

## 2020-10-06 DIAGNOSIS — Z79899 Other long term (current) drug therapy: Secondary | ICD-10-CM | POA: Insufficient documentation

## 2020-10-06 DIAGNOSIS — R9431 Abnormal electrocardiogram [ECG] [EKG]: Secondary | ICD-10-CM | POA: Diagnosis not present

## 2020-10-06 DIAGNOSIS — E1169 Type 2 diabetes mellitus with other specified complication: Secondary | ICD-10-CM | POA: Diagnosis not present

## 2020-10-06 DIAGNOSIS — E1122 Type 2 diabetes mellitus with diabetic chronic kidney disease: Secondary | ICD-10-CM | POA: Insufficient documentation

## 2020-10-06 DIAGNOSIS — R410 Disorientation, unspecified: Secondary | ICD-10-CM | POA: Diagnosis not present

## 2020-10-06 DIAGNOSIS — R4182 Altered mental status, unspecified: Secondary | ICD-10-CM | POA: Diagnosis not present

## 2020-10-06 LAB — COMPREHENSIVE METABOLIC PANEL
ALT: 15 U/L (ref 0–44)
AST: 25 U/L (ref 15–41)
Albumin: 3.9 g/dL (ref 3.5–5.0)
Alkaline Phosphatase: 64 U/L (ref 38–126)
Anion gap: 8 (ref 5–15)
BUN: 11 mg/dL (ref 8–23)
CO2: 22 mmol/L (ref 22–32)
Calcium: 8.8 mg/dL — ABNORMAL LOW (ref 8.9–10.3)
Chloride: 107 mmol/L (ref 98–111)
Creatinine, Ser: 1.33 mg/dL — ABNORMAL HIGH (ref 0.44–1.00)
GFR, Estimated: 41 mL/min — ABNORMAL LOW (ref >60)
Glucose, Bld: 132 mg/dL — ABNORMAL HIGH (ref 70–99)
Potassium: 3.8 mmol/L (ref 3.5–5.1)
Sodium: 137 mmol/L (ref 135–145)
Total Bilirubin: 0.6 mg/dL (ref 0.3–1.2)
Total Protein: 7.2 g/dL (ref 6.5–8.1)

## 2020-10-06 LAB — DIFFERENTIAL
Abs Immature Granulocytes: 0.02 10*3/uL (ref 0.00–0.07)
Basophils Absolute: 0.1 10*3/uL (ref 0.0–0.1)
Basophils Relative: 1 %
Eosinophils Absolute: 0.3 10*3/uL (ref 0.0–0.5)
Eosinophils Relative: 3 %
Immature Granulocytes: 0 %
Lymphocytes Relative: 33 %
Lymphs Abs: 2.9 10*3/uL (ref 0.7–4.0)
Monocytes Absolute: 0.6 10*3/uL (ref 0.1–1.0)
Monocytes Relative: 7 %
Neutro Abs: 4.9 10*3/uL (ref 1.7–7.7)
Neutrophils Relative %: 56 %

## 2020-10-06 LAB — CBC
HCT: 35.2 % — ABNORMAL LOW (ref 36.0–46.0)
Hemoglobin: 11.1 g/dL — ABNORMAL LOW (ref 12.0–15.0)
MCH: 27.7 pg (ref 26.0–34.0)
MCHC: 31.5 g/dL (ref 30.0–36.0)
MCV: 87.8 fL (ref 80.0–100.0)
Platelets: 297 10*3/uL (ref 150–400)
RBC: 4.01 MIL/uL (ref 3.87–5.11)
RDW: 13.6 % (ref 11.5–15.5)
WBC: 8.8 10*3/uL (ref 4.0–10.5)
nRBC: 0 % (ref 0.0–0.2)

## 2020-10-06 LAB — CBG MONITORING, ED: Glucose-Capillary: 139 mg/dL — ABNORMAL HIGH (ref 70–99)

## 2020-10-06 LAB — T4, FREE: Free T4: 1.03 ng/dL (ref 0.61–1.12)

## 2020-10-06 LAB — TROPONIN I (HIGH SENSITIVITY): Troponin I (High Sensitivity): 4 ng/L (ref <18)

## 2020-10-06 LAB — PROTIME-INR
INR: 1 (ref 0.8–1.2)
Prothrombin Time: 12.7 seconds (ref 11.4–15.2)

## 2020-10-06 LAB — TSH: TSH: 1.378 u[IU]/mL (ref 0.350–4.500)

## 2020-10-06 LAB — APTT: aPTT: 29 seconds (ref 24–36)

## 2020-10-06 NOTE — ED Provider Notes (Signed)
Emergency Medicine Provider Triage Evaluation Note  Kathleen Williamson , a 80 y.o. female  was evaluated in triage via EMS from [facility]. A friend called out to EMS for regards of her "not acting herself". When asked why shes here, patient states "because I was sick". When asked to elaborate, she reports weakness, being unable to get out of bed this AM on her own as well as not being confused. Unknown last known well specifically, but patient believes she started feeling not well this morning. Denies recent fever or illness. She continuously reports she wants to go home, and then will state she doesn't know why she's here.  Review of Systems  Positive: Weakness, difficulty finding words Negative: Fever, headache, chest pain  Physical Exam  BP (!) 200/108 (BP Location: Left Arm)   Pulse 74   Temp 98.1 F (36.7 C) (Oral)   Resp 18   LMP  (LMP Unknown)   SpO2 99%  Gen:   Awake, no distress; oriented to self and mildly to situation; Resp:  Normal effort  MSK:   Moves extremities without difficulty  Other:  Cranial nerves II-XII without clear deficit, no drift. Grip strength symmetric.   Medical Decision Making  Medically screening exam initiated at 6:09 PM.  Appropriate orders placed.  Kathleen Williamson was informed that the remainder of the evaluation will be completed by another provider, this initial triage assessment does not replace that evaluation, and the importance of remaining in the ED until their evaluation is complete.    Marlana Salvage, PA 10/06/20 1814    Lucrezia Starch, MD 10/07/20 2206

## 2020-10-06 NOTE — ED Triage Notes (Signed)
Patient arrived by EMS from [facility] independent living. Friend called 911 due to patients AMS. All that was relayed to EMS was that friend reported she was "acting weird"

## 2020-10-06 NOTE — ED Notes (Signed)
Pt given warm blanket, updated on wait time

## 2020-10-06 NOTE — ED Triage Notes (Signed)
Pt via EMS from [facility] independent living, friend called out for AMS. Pt is oriented to self only. Pt does not know why she is here. Denies weakness. Denies pain.

## 2020-10-06 NOTE — ED Provider Notes (Signed)
[facility] Emergency Department Provider Note  ____________________________________________   Event Date/Time   First MD Initiated Contact with Patient 10/06/20 2146     (approximate)  I have reviewed the triage vital signs and the nursing notes.   HISTORY  Chief Complaint Altered Mental Status    HPI Kathleen Williamson is a 80 y.o. female with coronary disease, CKD, hypothyroidism who comes in with concerns for altered mental status.  Patient comes from [facility].  A friend called out to EMS for rides of her not acting herself.  Patient reports being weak and unable to get a bed this AM.  Per triage.  Patient states that she feels like her normal self in triage.  She states that she felt hot and went to her friend's room and they stated that she was not acting her normal self.  She states that she had not eaten food and that she thinks that it was just because of her sugars being low.  She states that she feels in her normal self at this time.  She denies any other symptoms and is consistently requesting for it to go home.  She has been ambulating around the emergency room.  She denies any chest pain, shortness of breath, abdominal pain, urinary symptoms.  She continues to request to go back to her facility.           Past Medical History:  Diagnosis Date   Arthritis    fingers   CAD (coronary artery disease) 2003   MI s/p 5v CABG   Carotid stenosis    [place]   CKD (chronic kidney disease) stage 3, GFR 30-59 ml/min (HCC)    Dermatomyositis ([place])    saw Dr Estanislado Pandy, improved on its own   Diabetes mellitus without complication ([place])    Forehead laceration 09/15/2017   History of chicken pox    History of measles    History of migraine none since 2003   HLD (hyperlipidemia)    Hypothyroidism    MDD (major depressive disorder), recurrent episode, moderate ([place])    h/o SI after lost husband unexpectedly 2015   Microscopic colitis 05/2014   by  colonoscopy, lymphocytic, zoloft related   Osteoporosis    DEXA 03/2013 T -3.1, DEXA 10//2016 -2.9 hip, -2.2 spine   Pancreatic cyst    Dr Ralene Ok, no f/u needed   Prediabetes 2014   Subclavian steal syndrome    Urine incontinence     Patient Active Problem List   Diagnosis Date Noted   Pre-op evaluation 10/03/2020   Sensorineural hearing loss, bilateral 07/07/2020   Osteoarthritis of right hip 09/17/2019   Recurrent falls 09/15/2017   Tongue lesion 02/12/2017   Osteoarthritis 14/43/1540   Complicated grief 08/67/6195   Health maintenance examination 02/07/2016   Baker's cyst of knee 08/01/2015   Diabetes mellitus type 2, controlled, with complications ([place]) 09/32/6712   Subclavian steal syndrome    Medicare annual wellness visit, subsequent 01/31/2015   Advanced care planning/counseling discussion 01/31/2015   Carotid stenosis 01/31/2015   Vertebral artery stenosis/occlusion 01/31/2015   CKD (chronic kidney disease) stage 3, GFR 30-59 ml/min ([place]) 01/21/2015   Microscopic colitis 10/26/2014   Osteoporosis    Hypothyroidism    Dermatomyositis ([place])    MDD (major depressive disorder), recurrent severe, without psychosis ([place]) 01/12/2014   Atherosclerosis of native coronary artery of native heart without angina pectoris 12/13/2013   Hyperlipidemia associated with type 2 diabetes mellitus ([place]) 12/13/2013   Pseudocyst  of pancreas 08/29/2011    Past Surgical History:  Procedure Laterality Date   APPENDECTOMY  1997   with hysterectomy   BACK SURGERY  1981   lower   BREAST LUMPECTOMY Left 1977   benign   COLONOSCOPY WITH PROPOFOL N/A 05/23/2014   microscopic colitis - Garlan Fair, MD   CORONARY ARTERY BYPASS GRAFT  2003   x 5 v   ESOPHAGOGASTRODUODENOSCOPY (EGD) WITH PROPOFOL N/A 05/23/2014   WNL Garlan Fair, MD   ROTATOR CUFF REPAIR Right [phone]   TOOTH EXTRACTION  yrs ago   TOTAL ABDOMINAL HYSTERECTOMY W/ BILATERAL SALPINGOOPHORECTOMY  1997   endometriosis     Prior to Admission medications   Medication Sig Start Date End Date Taking? Authorizing Provider  Accu-Chek FastClix Lancets MISC USE AS INSTRUCTED TO CHECK BLOOD SUGAR ONCE DAILY 10/13/19   [name], MD  ACCU-CHEK GUIDE test strip USE AS INSTRUCTED TO CHECK BLOOD SUGAR ONCE DAILY 10/10/19   [name], MD  aspirin EC 81 MG tablet Take 81 mg by mouth at bedtime.    [provider]  Blood Glucose Monitoring Suppl (ACCU-CHEK GUIDE) w/Device KIT 1 each by Does not apply route as directed. Use as instructed to check blood sugar once daily 07/14/19   [name], MD  clonazePAM (KLONOPIN) 0.5 MG tablet Take 0.5 tablets (0.25 mg total) by mouth in the morning AND 1 tablet (0.5 mg total) at bedtime. 10/01/20   [name], MD  Coenzyme Q10 (COQ10) 100 MG CAPS Take 1 capsule by mouth at bedtime.     [provider]  denosumab (PROLIA) 60 MG/ML SOSY injection Inject 60 mg into the skin every 6 (six) months. 10/03/20   [name], MD  ezetimibe (ZETIA) 10 MG tablet Take 1 tablet (10 mg total) by mouth daily. 08/01/20   Jerline Pain, MD  levothyroxine (SYNTHROID) 50 MCG tablet Take 1 tablet (50 mcg total) by mouth daily. 06/18/20   [name], MD  loperamide (IMODIUM A-D) 2 MG tablet Take 2 mg by mouth as needed.     [provider]  mirtazapine (REMERON) 30 MG tablet Take 1 tablet (30 mg total) by mouth at bedtime. 06/18/20   [name], MD  nitroGLYCERIN (NITROLINGUAL) 0.4 MG/SPRAY spray Place 1 spray under the tongue every 5 (five) minutes as needed. 05/23/16   Jerline Pain, MD  pravastatin (PRAVACHOL) 80 MG tablet TAKE 1 TABLET BY MOUTH EVERY DAY IN THE EVENING 09/19/20   Jerline Pain, MD  Semaglutide (RYBELSUS) 7 MG TABS Take 7 mg by mouth daily. Take on an empty stomach, 30 to 60 minutes before the first food, beverage, or other medications with no more than 4 oz of plain water. 09/26/20   [name], MD  venlafaxine XR  (EFFEXOR-XR) 150 MG 24 hr capsule Take 1 capsule (150 mg total) by mouth daily with breakfast. 05/29/20   [name], MD    Allergies Sulfa antibiotics and Crestor [rosuvastatin calcium]  Family History  Problem Relation Age of Onset   CAD Father 88   CAD Mother 39   Diabetes Mother    Rheum arthritis Mother    Cancer Maternal Aunt        breast   CAD Other        strong paternal side   Stroke Neg Hx     Social History Social History   Tobacco Use   Smoking status: Never   Smokeless tobacco: Never  Vaping Use  Vaping Use: Never used  Substance Use Topics   Alcohol use: No   Drug use: No      Review of Systems Constitutional: No fever/chills, feeling cold weakness Eyes: No visual changes. ENT: No sore throat. Cardiovascular: Denies chest pain. Respiratory: Denies shortness of breath. Gastrointestinal: No abdominal pain.  No nausea, no vomiting.  No diarrhea.  No constipation. Genitourinary: Negative for dysuria. Musculoskeletal: Negative for back pain. Skin: Negative for rash. Neurological: Negative for headaches, focal weakness or numbness. All other ROS negative ____________________________________________   PHYSICAL EXAM:  VITAL SIGNS: ED Triage Vitals [10/06/20 1802]  Enc Vitals Group     BP (!) 200/108     Pulse Rate 74     Resp 18     Temp 98.1 F (36.7 C)     Temp Source Oral     SpO2 99 %     Weight      Height      Head Circumference      Peak Flow      Pain Score      Pain Loc      Pain Edu?      Excl. in [place]?     Constitutional: Alert and oriented x2 does not know the year.  Well appearing and in no acute distress. Eyes: Conjunctivae are normal. EOMI. Head: Atraumatic. Nose: No congestion/rhinnorhea. Mouth/Throat: Mucous membranes are moist.   Neck: No stridor. Trachea Midline. FROM Cardiovascular: Normal rate, regular rhythm. Grossly normal heart sounds.  Good peripheral circulation. Respiratory: Normal respiratory effort.   No retractions. Lungs CTAB. Gastrointestinal: Soft and nontender. No distention. No abdominal bruits.  Musculoskeletal: No lower extremity tenderness nor edema.  No joint effusions. Neurologic:  Normal speech and language. No gross focal neurologic deficits are appreciated.  Cranial 2 through 12 are intact.  Equal strength in arms and legs.  No sensation changes. Skin:  Skin is warm, dry and intact. No rash noted. Psychiatric: Mood and affect are normal. Speech and behavior are normal. GU: Deferred   ____________________________________________   LABS (all labs ordered are listed, but only abnormal results are displayed)  Labs Reviewed  CBC - Abnormal; Notable for the following components:      Result Value   Hemoglobin 11.1 (*)    HCT 35.2 (*)    All other components within normal limits  COMPREHENSIVE METABOLIC PANEL - Abnormal; Notable for the following components:   Glucose, Bld 132 (*)    Creatinine, Ser 1.33 (*)    Calcium 8.8 (*)    GFR, Estimated 41 (*)    All other components within normal limits  CBG MONITORING, ED - Abnormal; Notable for the following components:   Glucose-Capillary 139 (*)    All other components within normal limits  PROTIME-INR  APTT  DIFFERENTIAL  URINALYSIS, COMPLETE (UACMP) WITH MICROSCOPIC  I-STAT CREATININE, ED   ____________________________________________   ED ECG REPORT I, Vanessa [place], the attending physician, personally viewed and interpreted this ECG.  Normal sinus rate of 77, no ST elevation, T wave version V2, normal intervals ____________________________________________  RADIOLOGY   Official radiology report(s): CT HEAD WO CONTRAST  Result Date: 10/06/2020 CLINICAL DATA:  Altered mental status. EXAM: CT HEAD WITHOUT CONTRAST TECHNIQUE: Contiguous axial images were obtained from the base of the skull through the vertex without intravenous contrast. COMPARISON:  July 28, 2020 FINDINGS: Brain: There is mild cerebral atrophy  with widening of the extra-axial spaces and ventricular dilatation. There are areas of decreased attenuation within  the white matter tracts of the supratentorial brain, consistent with microvascular disease changes. Vascular: No hyperdense vessel or unexpected calcification. Skull: Normal. Negative for fracture or focal lesion. Sinuses/Orbits: No acute finding. Other: None. IMPRESSION: 1. Generalized cerebral atrophy. 2. No acute intracranial abnormality. Electronically Signed   By: Virgina Norfolk M.D.   On: 10/06/2020 19:55    ____________________________________________   PROCEDURES  Procedure(s) performed (including Critical Care):  Procedures   ____________________________________________   INITIAL IMPRESSION / ASSESSMENT AND PLAN / ED COURSE  Kathleen Williamson was evaluated in Emergency Department on 10/06/2020 for the symptoms described in the history of present illness. She was evaluated in the context of the global COVID-19 pandemic, which necessitated consideration that the patient might be at risk for infection with the SARS-CoV-2 virus that causes COVID-19. Institutional protocols and algorithms that pertain to the evaluation of patients at risk for COVID-19 are in a state of rapid change based on information released by regulatory bodies including the CDC and federal and state organizations. These policies and algorithms were followed during the patient's care in the ED.    Patient herself is denying any symptoms and she states that she feels that her normal baseline self.  She is alert and oriented x2 does not know the year but this can still be patient's baseline.  She shows me the card of a business contact of a Animal nutritionist at [facility] she states that she used to work with him at [employer].  She does not have any family listed in the emergency contact I offered to call them to get collateral information but she stated that she did not want me to wake them up.  I  also offered to call her friend.  She again did not want me to call them.  She has been ambulatory does not appear to be in any discomfort and appears to be acting her normal self.  Again asked tried to explain to patient that I did want her to go home and have something bad happening to her but she continues to be insistent that she would like to go home.  Given her reassuring labs and CT imaging I think that this is reasonable do not see any reason to hold her which I feel that she is probably at her baseline self given she is able to have entire conversation with me and seems to think that this was from her sugars being low  Labs are reassuring other than her kidney function which is around her baseline at 1.33  Thyroid test a cardiac marker was negative.  Patient continues to insist that she would like to go home.  She tolerated p.o., is ambulated around the room therefore will discharge  Patient did let me call her friend Horris Latino. She is going to come to the emergency room make sure that she is acting her baseline self.  She does report over the past few weeks a little bit of confusion here there but nothing acutely changed today.  I explained to them that given the cerebral atrophy that she could have some changes consistent with memory issues from dementia and that they can follow-up with neurology.  If Horris Latino gets here and she states that this is an acute change from her baseline then obviously we can talk to the hospital team for admission but suspect that this is more likely a chronic process.  She seems to be able to take care of herself and ambulate and therefore  you should be able to go back to her facility        ____________________________________________   FINAL CLINICAL IMPRESSION(S) / ED DIAGNOSES   Final diagnoses:  Confusion  Type 2 diabetes mellitus with other specified complication, unspecified whether long term insulin use ([place])      MEDICATIONS GIVEN DURING THIS  VISIT:  Medications - No data to display   ED Discharge Orders     None        Note:  This document was prepared using Dragon voice recognition software and may include unintentional dictation errors.    Vanessa Platter, MD 10/07/20 [phone]

## 2020-10-07 NOTE — Discharge Instructions (Addendum)
Your work-up was reassuring.  No evidence of heart attack, severe dehydration, thyroid dysfunction.  Return to the ER for fevers, worsening confusion or any other concerns.  Her CT scan is as below.  She could be started to have some dementia or other cognitive process and she can follow-up with neurology.  If she is going to take a while to get in with them she should also talk to her primary care doctor.   IMPRESSION: 1. Generalized cerebral atrophy. 2. No acute intracranial abnormality

## 2020-10-07 NOTE — ED Notes (Signed)
Pts friend is here and they report that the pt is at her base line. Pt was ambulated in hall with one person assist. Brief was changed and pt placed in scrubs because of her clothes having urine on them

## 2020-10-08 NOTE — Telephone Encounter (Signed)
Lvm returning Tina's call.

## 2020-10-09 NOTE — Telephone Encounter (Signed)
Lvm returning Tina's call.

## 2020-10-09 NOTE — Telephone Encounter (Signed)
Kathleen Williamson returning call.  States she was initially calling to discuss pt's meds.  However, she spoke with pt recently and was told she was hospitalized due to confusion and that her mind was all over the place.  They also discussed pt's multiple falls.   Kathleen Williamson is trying to speak with [facility] to see what kind of "wrap around" services are available for pt and to see if pt gets assistance taking her meds.   Kathleen Williamson is wondering if Remeron, venlafaxine and clonazepam may be too sedating for pt in reference to age and medical conditions.  Says pt still drives herself to her appts with her Kathleen Williamson).   Kathleen Williamson just wanted to touch base with Dr. Darnell Level to make sure everyone is on the same page.  Says he can call anytime with any questions at [phone].  Kathleen Williamson is aware Dr. Darnell Level is out of office.

## 2020-10-10 ENCOUNTER — Telehealth: Payer: Self-pay

## 2020-10-10 NOTE — Chronic Care Management (AMB) (Addendum)
Chronic Care Management Pharmacy Assistant   Name: Kathleen Williamson  MRN: [id number] DOB: [birth year]  Reason for Encounter: Medication Adherence and Delivery Coordination  Recent office visits:  10/01/20 - Dr.Gutierrez PCP - labs ordered chest xray, drop klonopin dose to 1/2 tablet in the morning and 1 tablet at night time  Recent consult visits:  08/31/20 - Cardiology - no medication changes   Hospital visits:  10/06/20 [facility] ED altered mental status no medication changes  Medications: Outpatient Encounter Medications as of 10/10/2020  Medication Sig   Accu-Chek FastClix Lancets MISC USE AS INSTRUCTED TO CHECK BLOOD SUGAR ONCE DAILY   ACCU-CHEK GUIDE test strip USE AS INSTRUCTED TO CHECK BLOOD SUGAR ONCE DAILY   aspirin EC 81 MG tablet Take 81 mg by mouth at bedtime.   Blood Glucose Monitoring Suppl (ACCU-CHEK GUIDE) w/Device KIT 1 each by Does not apply route as directed. Use as instructed to check blood sugar once daily   clonazePAM (KLONOPIN) 0.5 MG tablet Take 0.5 tablets (0.25 mg total) by mouth in the morning AND 1 tablet (0.5 mg total) at bedtime.   Coenzyme Q10 (COQ10) 100 MG CAPS Take 1 capsule by mouth at bedtime.    denosumab (PROLIA) 60 MG/ML SOSY injection Inject 60 mg into the skin every 6 (six) months.   ezetimibe (ZETIA) 10 MG tablet Take 1 tablet (10 mg total) by mouth daily.   levothyroxine (SYNTHROID) 50 MCG tablet Take 1 tablet (50 mcg total) by mouth daily.   loperamide (IMODIUM A-D) 2 MG tablet Take 2 mg by mouth as needed.    mirtazapine (REMERON) 30 MG tablet Take 1 tablet (30 mg total) by mouth at bedtime.   nitroGLYCERIN (NITROLINGUAL) 0.4 MG/SPRAY spray Place 1 spray under the tongue every 5 (five) minutes as needed.   pravastatin (PRAVACHOL) 80 MG tablet TAKE 1 TABLET BY MOUTH EVERY DAY IN THE EVENING   Semaglutide (RYBELSUS) 7 MG TABS Take 7 mg by mouth daily. Take on an empty stomach, 30 to 60 minutes before the first food, beverage, or other medications  with no more than 4 oz of plain water.   venlafaxine XR (EFFEXOR-XR) 150 MG 24 hr capsule Take 1 capsule (150 mg total) by mouth daily with breakfast.   No facility-administered encounter medications on file as of 10/10/2020.   BP Readings from Last 3 Encounters:  10/07/20 123/66  10/01/20 134/78  08/31/20 130/70    Lab Results  Component Value Date   HGBA1C 6.7 (H) 10/01/2020     Patient obtains medications through Adherence Packaging  30 Days   Last adherence delivery date: onboarding and trying to get med sync    Patient is due for pill pack delivery on: 10/23/2020  Spoke with patient on 10/11/2020 reviewed medications and coordinated delivery.  This delivery to include: Adherence Packaging  30 Days  Packs:  Ezetimibe 10 mg - take 1 tablet breakfast Mirtazapine 15 mg - take 1 tablet bedtime (reports currently taking 15 mg not 30 mg, requested prescription from PCP) Levothyroxine 50 mcg - take 1 tablet breakfast Venlafaxine XR 150 mg - take 1 tablet breakfast  PRN vials: Clonazepam 0.79m take 1/2 tablet 2 times daily as needed (she has enough supply on hand as of 10/11/20)  Patient declined the following medications this month: Pravastatin 80 mg - take 1 tablet evening meal (30 day supply on hand) Rybelsus 3 mg take - 1 tablet before breakfast (reports currently taking 3 mg, not 7 mg, decided  to stop for now per PCP) Rybelsus 61m take 1 tablet breakfast (pt reports she is still taking 3 mg daily) Calcium 600+ D3 take 1 tablet breakfast (not taking, unable to tolerate due to upset stomach) OTC CoQ10 mg - 1 tablet daily (30 day supply on hand) OTC Aspirin 81 mg - 1 tablet daily (30 day supply on hand)  Refills requested from PCP include: Venlafaxine XR 1522mtake 1 tablet with breakfast  Confirmed delivery date of 10/23/2020, advised patient that MiSharyn Lullill contact her the morning of delivery.  No BG readings available   Follow-Up:  Coordination of Enhanced Pharmacy  Services and Pharmacist Review  MiDebbora DusCPP notified  VeAvel SensorCCCallimontharmacy Assistant [phone] have reviewed the care management and care coordination activities outlined in this encounter and I am certifying that I agree with the content of this note. See telephone note. Attempted to contact soEducation officer, museumt [employer] discuss bubble packs. Left VM for return call.  MiDebbora DusPharmD Clinical Pharmacist [facility] [phone]

## 2020-10-10 NOTE — Telephone Encounter (Addendum)
Tried to call back, unable to reach [place]. Plz notify Otila Kluver we will review meds with patient at upcoming appt next week. We had already started slow taper of klonopin to see if this would help.  I agree increased assistance at [facility] would be useful, let us know what she finds out, or if we need to do anything to facilitate this.

## 2020-10-11 ENCOUNTER — Telehealth: Payer: Self-pay

## 2020-10-11 NOTE — Telephone Encounter (Signed)
Lvm asking Otila Kluver to call back.  Need to relay Dr. Synthia Innocent message.

## 2020-10-11 NOTE — Telephone Encounter (Signed)
Refill needed for venlafaxine to [facility]. Last filled 07/30/20 90 DS - due 10/23/20 to start adherence packaging.  Debbora Dus, PharmD Clinical Pharmacist [facility] at [facility] [phone]

## 2020-10-12 ENCOUNTER — Telehealth: Payer: Self-pay

## 2020-10-12 MED ORDER — MIRTAZAPINE 15 MG PO TABS: 15.0000 mg | ORAL_TABLET | Freq: Every day | ORAL | 6 refills | Status: DC

## 2020-10-12 MED ORDER — VENLAFAXINE HCL ER 150 MG PO CP24: 150.0000 mg | ORAL_CAPSULE | Freq: Every day | ORAL | 1 refills | Status: DC

## 2020-10-12 NOTE — Telephone Encounter (Signed)
May continue mirtazapine 15mg  nightly.  Let's hold rybelsus for now.

## 2020-10-12 NOTE — Telephone Encounter (Signed)
Called patient to review meds. She will start bubble packs from [facility] on 10/23/20. Will start with 30 DS in case medications need to be changed. See note on 10/11/20 for full medication delivery summary. I have scheduled a visit on 7/12 at [facility] to delivery her medications and explain the bubble packs.  PCP consult -  --States she is still taking mirtazapine 15 mg daily and has not increased to 30 mg. Please advise which dose you would like her to continue on. We need an updated prescription if she will continue 15 mg daily.  -She also states she is still taking Rybelsus 3 mg, not 7 mg. It seems there are adherence concerns as she should be out of the 3 mg dose by now. Since 3 mg dose has not demonstrated glycemic effects in trials, I am not sure if we should discontinue it altogether, or go ahead and increase to 7 mg. Please advise.   Lab Results  Component Value Date/Time   HGBA1C 6.7 (H) 10/01/2020 12:11 PM   HGBA1C 7.6 (H) 06/18/2020 10:55 AM    Debbora Dus, PharmD Clinical Pharmacist [facility] at [facility] [phone]

## 2020-10-12 NOTE — Telephone Encounter (Signed)
E-scribed refill 

## 2020-10-12 NOTE — Telephone Encounter (Signed)
Kathleen Williamson returning call.   States, per Laurena Spies (phn # [phone]), social worker at [facility], will work on putting pt's meds in blister packs.  Also, getting dietician and nutrition and pop-in home care to discuss medication reminders.

## 2020-10-16 ENCOUNTER — Other Ambulatory Visit: Payer: Self-pay | Admitting: Cardiology

## 2020-10-19 ENCOUNTER — Ambulatory Visit: Payer: PPO | Admitting: Family Medicine

## 2020-10-20 ENCOUNTER — Telehealth: Payer: Self-pay | Admitting: Family Medicine

## 2020-10-20 NOTE — Telephone Encounter (Signed)
Pt missed appt on Friday. She does need OV especially with recent ER visit. Plz call to reschedule.

## 2020-10-23 ENCOUNTER — Ambulatory Visit (INDEPENDENT_AMBULATORY_CARE_PROVIDER_SITE_OTHER): Payer: PPO

## 2020-10-23 DIAGNOSIS — E785 Hyperlipidemia, unspecified: Secondary | ICD-10-CM

## 2020-10-23 DIAGNOSIS — E118 Type 2 diabetes mellitus with unspecified complications: Secondary | ICD-10-CM

## 2020-10-23 DIAGNOSIS — F332 Major depressive disorder, recurrent severe without psychotic features: Secondary | ICD-10-CM | POA: Diagnosis not present

## 2020-10-23 DIAGNOSIS — E1169 Type 2 diabetes mellitus with other specified complication: Secondary | ICD-10-CM

## 2020-10-24 ENCOUNTER — Telehealth: Payer: Self-pay

## 2020-10-24 ENCOUNTER — Ambulatory Visit: Payer: PPO | Admitting: Family Medicine

## 2020-10-24 NOTE — Telephone Encounter (Signed)
Patient called to report cloudy urine for the past few days. She is not having any burning or pain, just heaviness. Denies any other symptoms. She was informed to call back if symptoms worsen and that her information would be sent to nurse for triage.

## 2020-10-24 NOTE — Telephone Encounter (Signed)
Spoke with patient. We do not have any openings tomorrow and I offered patient to be scheduled at one of our other [place] locations tomorrow or I can schedule for Friday but I let patient know that it should be taking care of sooner than later. Patient wanted to wait and think about this. I did suggest going to [facility] in [place] at least first thing in the morning to have her urine checked. Patient verbalized understanding and will keep [name] updated. FYI to Dr Darnell Level

## 2020-10-25 NOTE — Telephone Encounter (Signed)
Please call for update on possible UTI symptoms.

## 2020-10-26 ENCOUNTER — Telehealth: Payer: Self-pay

## 2020-10-26 ENCOUNTER — Encounter: Payer: Self-pay | Admitting: Emergency Medicine

## 2020-10-26 ENCOUNTER — Ambulatory Visit
Admission: EM | Admit: 2020-10-26 | Discharge: 2020-10-26 | Disposition: A | Discharge status: Other Acute Inpatient Hospital | Payer: PPO

## 2020-10-26 ENCOUNTER — Emergency Department
Admission: EM | Admit: 2020-10-26 | Discharge: 2020-10-26 | Disposition: A | Discharge status: Home / Self Care | Payer: PPO | Attending: Emergency Medicine | Admitting: Emergency Medicine

## 2020-10-26 ENCOUNTER — Other Ambulatory Visit: Payer: Self-pay

## 2020-10-26 DIAGNOSIS — I10 Essential (primary) hypertension: Secondary | ICD-10-CM | POA: Diagnosis not present

## 2020-10-26 DIAGNOSIS — E785 Hyperlipidemia, unspecified: Secondary | ICD-10-CM | POA: Insufficient documentation

## 2020-10-26 DIAGNOSIS — Z79899 Other long term (current) drug therapy: Secondary | ICD-10-CM | POA: Insufficient documentation

## 2020-10-26 DIAGNOSIS — R410 Disorientation, unspecified: Secondary | ICD-10-CM | POA: Diagnosis not present

## 2020-10-26 DIAGNOSIS — Z951 Presence of aortocoronary bypass graft: Secondary | ICD-10-CM | POA: Diagnosis not present

## 2020-10-26 DIAGNOSIS — N390 Urinary tract infection, site not specified: Secondary | ICD-10-CM | POA: Diagnosis not present

## 2020-10-26 DIAGNOSIS — B9689 Other specified bacterial agents as the cause of diseases classified elsewhere: Secondary | ICD-10-CM | POA: Insufficient documentation

## 2020-10-26 DIAGNOSIS — R350 Frequency of micturition: Secondary | ICD-10-CM | POA: Diagnosis present

## 2020-10-26 DIAGNOSIS — F05 Delirium due to known physiological condition: Secondary | ICD-10-CM

## 2020-10-26 DIAGNOSIS — Z7982 Long term (current) use of aspirin: Secondary | ICD-10-CM | POA: Insufficient documentation

## 2020-10-26 DIAGNOSIS — N3000 Acute cystitis without hematuria: Secondary | ICD-10-CM | POA: Diagnosis not present

## 2020-10-26 DIAGNOSIS — I2510 Atherosclerotic heart disease of native coronary artery without angina pectoris: Secondary | ICD-10-CM | POA: Diagnosis not present

## 2020-10-26 DIAGNOSIS — E1169 Type 2 diabetes mellitus with other specified complication: Secondary | ICD-10-CM | POA: Insufficient documentation

## 2020-10-26 DIAGNOSIS — R8290 Unspecified abnormal findings in urine: Secondary | ICD-10-CM | POA: Diagnosis not present

## 2020-10-26 DIAGNOSIS — N183 Chronic kidney disease, stage 3 unspecified: Secondary | ICD-10-CM | POA: Diagnosis not present

## 2020-10-26 DIAGNOSIS — R0902 Hypoxemia: Secondary | ICD-10-CM | POA: Diagnosis not present

## 2020-10-26 DIAGNOSIS — E039 Hypothyroidism, unspecified: Secondary | ICD-10-CM | POA: Diagnosis not present

## 2020-10-26 DIAGNOSIS — R0689 Other abnormalities of breathing: Secondary | ICD-10-CM | POA: Diagnosis not present

## 2020-10-26 LAB — URINALYSIS, COMPLETE (UACMP) WITH MICROSCOPIC
Bilirubin Urine: NEGATIVE
Glucose, UA: NEGATIVE mg/dL
Ketones, ur: NEGATIVE mg/dL
Nitrite: POSITIVE — AB
Protein, ur: NEGATIVE mg/dL
Specific Gravity, Urine: 1.003 — ABNORMAL LOW (ref 1.005–1.030)
Squamous Epithelial / HPF: NONE SEEN (ref 0–5)
WBC, UA: >50 WBC/hpf — ABNORMAL HIGH (ref 0–5)
pH: 6 (ref 5.0–8.0)

## 2020-10-26 MED ORDER — CEFTRIAXONE SODIUM 1 G IJ SOLR
500.0000 mg | Freq: Once | INTRAMUSCULAR | Status: AC
  Administered 2020-10-26: 500 mg via INTRAMUSCULAR
  Filled 2020-10-26: qty 10

## 2020-10-26 MED ORDER — CEPHALEXIN 500 MG PO CAPS: 500.0000 mg | ORAL_CAPSULE | Freq: Two times a day (BID) | ORAL | 0 refills | Status: AC

## 2020-10-26 NOTE — Telephone Encounter (Signed)
SW at [facility] [place], called back and she confirmed patient will be taken to Urgent Care at 11 AM.  She will also assist with transportation to PCP visit on Tuesday at 2 PM if patient is still unable to drive.   Debbora Dus, PharmD Clinical Pharmacist [facility] at [facility] [phone]

## 2020-10-26 NOTE — ED Provider Notes (Signed)
[facility] ([place]) Emergency Department Provider Note ____________________________________________   Event Date/Time   First MD Initiated Contact with Patient 10/26/20 1624     (approximate)  I have reviewed the triage vital signs and the nursing notes.   HISTORY  Chief Complaint Urinary Frequency  HPI Kathleen Williamson is a 80 y.o. female with history as listed below presents to the emergency department for treatment and evaluation of urinary frequency. She had been evaluated at urgent care, but was unable to provide a urine specimen. She was sent here for further evaluation and to ensure no evidence of urosepsis.    Past Medical History:  Diagnosis Date   Arthritis    fingers   CAD (coronary artery disease) 2003   MI s/p 5v CABG   Carotid stenosis    [place]   CKD (chronic kidney disease) stage 3, GFR 30-59 ml/min (HCC)    Dermatomyositis ([place])    saw Dr Estanislado Pandy, improved on its own   Diabetes mellitus without complication ([place])    Forehead laceration 09/15/2017   History of chicken pox    History of measles    History of migraine none since 2003   HLD (hyperlipidemia)    Hypothyroidism    MDD (major depressive disorder), recurrent episode, moderate ([place])    h/o SI after lost husband unexpectedly 2015   Microscopic colitis 05/2014   by colonoscopy, lymphocytic, zoloft related   Osteoporosis    DEXA 03/2013 T -3.1, DEXA 10//2016 -2.9 hip, -2.2 spine   Pancreatic cyst    Dr Ralene Ok, no f/u needed   Prediabetes 2014   Subclavian steal syndrome    Urine incontinence     Patient Active Problem List   Diagnosis Date Noted   Pre-op evaluation 10/03/2020   Sensorineural hearing loss, bilateral 07/07/2020   Osteoarthritis of right hip 09/17/2019   Recurrent falls 09/15/2017   Tongue lesion 02/12/2017   Osteoarthritis 93/73/4287   Complicated grief 68/02/5725   Health maintenance examination 02/07/2016   Baker's cyst of knee 08/01/2015   Diabetes  mellitus type 2, controlled, with complications ([place]) 20/35/5974   Subclavian steal syndrome    Medicare annual wellness visit, subsequent 01/31/2015   Advanced care planning/counseling discussion 01/31/2015   Carotid stenosis 01/31/2015   Vertebral artery stenosis/occlusion 01/31/2015   CKD (chronic kidney disease) stage 3, GFR 30-59 ml/min ([place]) 01/21/2015   Microscopic colitis 10/26/2014   Osteoporosis    Hypothyroidism    Dermatomyositis ([place])    MDD (major depressive disorder), recurrent severe, without psychosis ([place]) 01/12/2014   Atherosclerosis of native coronary artery of native heart without angina pectoris 12/13/2013   Hyperlipidemia associated with type 2 diabetes mellitus ([place]) 12/13/2013   Pseudocyst of pancreas 08/29/2011    Past Surgical History:  Procedure Laterality Date   APPENDECTOMY  1997   with hysterectomy   [place]   lower   BREAST LUMPECTOMY Left 1977   benign   COLONOSCOPY WITH PROPOFOL N/A 05/23/2014   microscopic colitis - Garlan Fair, MD   CORONARY ARTERY BYPASS GRAFT  2003   x 5 v   ESOPHAGOGASTRODUODENOSCOPY (EGD) WITH PROPOFOL N/A 05/23/2014   WNL Garlan Fair, MD   ROTATOR CUFF REPAIR Right [phone]   TOOTH EXTRACTION  yrs ago   TOTAL ABDOMINAL HYSTERECTOMY W/ BILATERAL SALPINGOOPHORECTOMY  1997   endometriosis    Prior to Admission medications   Medication Sig Start Date End Date Taking? Authorizing Provider  cephALEXin (KEFLEX) 500 MG capsule  Take 1 capsule (500 mg total) by mouth 2 (two) times daily for 7 days. 10/26/20 11/02/20 Yes Elijiah Mickley B, FNP  Accu-Chek FastClix Lancets MISC USE AS INSTRUCTED TO CHECK BLOOD SUGAR ONCE DAILY Patient not taking: No sig reported 10/13/19   [name], MD  ACCU-CHEK GUIDE test strip USE AS INSTRUCTED TO CHECK BLOOD SUGAR ONCE DAILY Patient not taking: No sig reported 10/10/19   [name], MD  aspirin EC 81 MG tablet Take 81 mg by mouth at bedtime.    [provider]  Blood Glucose Monitoring Suppl (ACCU-CHEK GUIDE) w/Device KIT 1 each by Does not apply route as directed. Use as instructed to check blood sugar once daily Patient not taking: No sig reported 07/14/19   [name], MD  clonazePAM (KLONOPIN) 0.5 MG tablet Take 0.5 tablets (0.25 mg total) by mouth in the morning AND 1 tablet (0.5 mg total) at bedtime. Patient not taking: Reported on 10/26/2020 10/01/20   [name], MD  Coenzyme Q10 (COQ10) 100 MG CAPS Take 1 capsule by mouth at bedtime.     [provider]  denosumab (PROLIA) 60 MG/ML SOSY injection Inject 60 mg into the skin every 6 (six) months. 10/03/20   [name], MD  ezetimibe (ZETIA) 10 MG tablet TAKE ONE TABLET BY MOUTH ONCE DAILY 10/18/20   Jerline Pain, MD  levothyroxine (SYNTHROID) 50 MCG tablet Take 1 tablet (50 mcg total) by mouth daily. 06/18/20   [name], MD  loperamide (IMODIUM A-D) 2 MG tablet Take 2 mg by mouth as needed.  Patient not taking: No sig reported    [provider]  mirtazapine (REMERON) 15 MG tablet Take 1 tablet (15 mg total) by mouth at bedtime. 10/12/20   [name], MD  nitroGLYCERIN (NITROLINGUAL) 0.4 MG/SPRAY spray Place 1 spray under the tongue every 5 (five) minutes as needed. Patient not taking: No sig reported 05/23/16   Jerline Pain, MD  pravastatin (PRAVACHOL) 80 MG tablet TAKE 1 TABLET BY MOUTH EVERY DAY IN THE EVENING 09/19/20   Jerline Pain, MD  venlafaxine XR (EFFEXOR-XR) 150 MG 24 hr capsule Take 1 capsule (150 mg total) by mouth daily with breakfast. 10/12/20   [name], MD    Allergies Sulfa antibiotics and Crestor [rosuvastatin calcium]  Family History  Problem Relation Age of Onset   CAD Father 7   CAD Mother 45   Diabetes Mother    Rheum arthritis Mother    Cancer Maternal Aunt        breast   CAD Other        strong paternal side   Stroke Neg Hx     Social History Social History   Tobacco Use   Smoking status: Never    Smokeless tobacco: Never  Vaping Use   Vaping Use: Never used  Substance Use Topics   Alcohol use: No   Drug use: No    Review of Systems  Constitutional: No fever/chills Eyes: No visual changes. ENT: No sore throat. Cardiovascular: Denies chest pain. Respiratory: Denies shortness of breath. Gastrointestinal: No abdominal pain.  No nausea, no vomiting.  No diarrhea.  No constipation. Genitourinary: Negative for dysuria. Positive for urinary frequency. Musculoskeletal: Negative for back pain. Skin: Negative for rash. Neurological: Negative for headaches, focal weakness or numbness. ____________________________________________   PHYSICAL EXAM:  VITAL SIGNS: ED Triage Vitals  Enc Vitals Group     BP 10/26/20 1304 (!) 163/71     Pulse Rate 10/26/20  1304 67     Resp 10/26/20 1304 18     Temp 10/26/20 1304 98.3 F (36.8 C)     Temp Source 10/26/20 1304 Oral     SpO2 10/26/20 1304 100 %     Weight 10/26/20 1301 130 lb (59 kg)     Height 10/26/20 1301 5' 3"  (1.6 m)     Head Circumference --      Peak Flow --      Pain Score 10/26/20 1301 0     Pain Loc --      Pain Edu? --      Excl. in [place]? --     Constitutional: Alert and oriented. Well appearing and in no acute distress. Eyes: Conjunctivae are normal.  Head: Atraumatic. Nose: No congestion/rhinnorhea. Mouth/Throat: Mucous membranes are moist.  Oropharynx non-erythematous. Neck: No stridor.   Hematological/Lymphatic/Immunilogical: No cervical lymphadenopathy. Cardiovascular: Normal rate, regular rhythm. Grossly normal heart sounds.  Good peripheral circulation. Respiratory: Normal respiratory effort.  No retractions. Lungs CTAB. Gastrointestinal: Soft and nontender. No distention. No abdominal bruits. No CVA tenderness. Genitourinary:  Musculoskeletal: No lower extremity tenderness nor edema.  No joint effusions. Neurologic:  Normal speech and language. No gross focal neurologic deficits are appreciated. No gait  instability. Skin:  Skin is warm, dry and intact. No rash noted. Psychiatric: Mood and affect are normal. Speech and behavior are normal.  ____________________________________________   LABS (all labs ordered are listed, but only abnormal results are displayed)  Labs Reviewed  URINALYSIS, COMPLETE (UACMP) WITH MICROSCOPIC - Abnormal; Notable for the following components:      Result Value   Color, Urine YELLOW (*)    APPearance HAZY (*)    Specific Gravity, Urine 1.003 (*)    Hgb urine dipstick SMALL (*)    Nitrite POSITIVE (*)    Leukocytes,Ua LARGE (*)    WBC, UA >50 (*)    Bacteria, UA MANY (*)    All other components within normal limits  URINE CULTURE   ____________________________________________  EKG  Not indicated ____________________________________________  RADIOLOGY  ED MD interpretation:    Not indicated I, Carys Malina, personally viewed and evaluated these images (plain radiographs) as part of my medical decision making, as well as reviewing the written report by the radiologist.  Official radiology report(s): No results found.  ____________________________________________   PROCEDURES  Procedure(s) performed (including Critical Care):  Procedures  ____________________________________________   INITIAL IMPRESSION / ASSESSMENT AND PLAN     80 year old female presenting to the emergency department for treatment and evaluation of urinary frequency.  While awaiting ER room assignment, patient was able to provide a urine specimen.  She does have a nitrate positive urinalysis with a large amount leukocytes, greater than 50 white blood cells, and many bacteria.  There is also small amount of hemoglobin.  Vital signs are stable.  She has no indication of sepsis with a normal temperature, normal heart rate, normal respiratory rate, slightly hypertensive blood pressure and normal O2 sat.  Plan will be to give her IM Rocephin 500 mg here and treat her  outpatient with Keflex.  Patient is aware and agreeable to the plan.  She is anxious for discharge.    ___________________________________________   FINAL CLINICAL IMPRESSION(S) / ED DIAGNOSES  Final diagnoses:  Acute cystitis without hematuria     ED Discharge Orders          Ordered    cephALEXin (KEFLEX) 500 MG capsule  2 times daily  10/26/20 Bricelyn Linares was evaluated in Emergency Department on 10/26/2020 for the symptoms described in the history of present illness. She was evaluated in the context of the global COVID-19 pandemic, which necessitated consideration that the patient might be at risk for infection with the SARS-CoV-2 virus that causes COVID-19. Institutional protocols and algorithms that pertain to the evaluation of patients at risk for COVID-19 are in a state of rapid change based on information released by regulatory bodies including the CDC and federal and state organizations. These policies and algorithms were followed during the patient's care in the ED.   Note:  This document was prepared using Dragon voice recognition software and may include unintentional dictation errors.    Victorino Dike, FNP 10/26/20 2143    Harvest Dark, MD 10/26/20 [phone]

## 2020-10-26 NOTE — Progress Notes (Signed)
Chronic Care Management Pharmacy Note  10/26/2020 Name:  Kathleen Williamson MRN:  [id number] DOB:  [birth year]  Summary: Delivered pill packs to patient's home. Discarded expired pill bottles. She will continue to take her OTC items (aspirin and coq10) from vials until next month pill packs. She affirmed understanding. Unable to find her glucometer today at home. No readings to report. She has been on Rybelsus 3 mg but will stop today per prior discussion with PCP. She has been off her clonazepam for the past week and states she is doing well of it. Declines interest in restarting at this time.  Recommendations: Patient agreed to include OTCs in pill pack next month so that all of her meds are in one place. Recommend home monitoring glucose once daily in morning. Closely follow up with PCP for diabetes and anxiety since she is off clonazepam.  Plan: CCM delivery of pill packs every 30 days. CCM follow up in 30 days for med adherence. PCP visit 7/19 at 2 PM, patient aware.  Subjective: Kathleen Williamson is an 80 y.o. year old female who is a primary patient of [name], MD.  The CCM team was consulted for assistance with disease management and care coordination needs.    Engaged with patient face to face at her home for follow up visit in response to provider referral for pharmacy case management and/or care coordination services.   Consent to Services:  The patient was given information about Chronic Care Management services, agreed to services, and gave verbal consent prior to initiation of services.  Please see initial visit note for detailed documentation.   Patient Care Team: [name], MD as PCP - General (Family Medicine) Jerline Pain, MD as PCP - Cardiology (Cardiology) Dingeldein, Remo Lipps, MD as Referring Physician (Ophthalmology) Debbora Dus, [facility] as Pharmacist (Pharmacist)  Patient health concerns: She denies health concerns today and is doing well.  Recent  office visits: 10/01/20- PCP - Presented for pre-op eval, Drop klonopin dose to 1/2 tablet in the morning and 1 tablet at night time - do this for at least a week and monitor effect on balance. If mood is doing well, may stop the morning dose altogether.  Recent consult visits: None since last CCM visit   Hospital visits: ED - 10/06/20 - Confusion, thought to be chronic process, follow up with neurology ED - 07/27/20 - Generalized weakness x 2 weeks, fall. BG 315, not on any medications for diabetes. Work up unremarkable. Follow up with PCP for diabetes management.  Objective:  Lab Results  Component Value Date   CREATININE 1.33 (H) 10/06/2020   BUN 11 10/06/2020   GFR 39.68 (L) 10/01/2020   GFRNONAA 41 (L) 10/06/2020   GFRAA 46 (L) 01/31/2014   NA 137 10/06/2020   K 3.8 10/06/2020   CALCIUM 8.8 (L) 10/06/2020   CO2 22 10/06/2020   GLUCOSE 132 (H) 10/06/2020    Lab Results  Component Value Date/Time   HGBA1C 6.7 (H) 10/01/2020 12:11 PM   HGBA1C 7.6 (H) 06/18/2020 10:55 AM   GFR 39.68 (L) 10/01/2020 12:11 PM   GFR 32.40 (L) 06/18/2020 10:55 AM   MICROALBUR 5.7 (H) 06/18/2020 10:55 AM   MICROALBUR 0.7 02/18/2019 09:44 AM    Last diabetic Eye exam:  Lab Results  Component Value Date/Time   HMDIABEYEEXA No Retinopathy 05/22/2020 12:00 AM    Last diabetic Foot exam:  09/27/2018 normal   Lab Results  Component Value Date   CHOL 156 02/18/2019  HDL 39.30 02/18/2019   LDLCALC 77 02/18/2019   LDLDIRECT 77.0 06/18/2020   TRIG 194.0 (H) 02/18/2019   CHOLHDL 4 02/18/2019    Hepatic Function Latest Ref Rng & Units 10/06/2020 10/01/2020 06/18/2020  Total Protein 6.5 - 8.1 g/dL 7.2 6.7 6.5  Albumin 3.5 - 5.0 g/dL 3.9 4.3 4.0  AST 15 - 41 U/L _0 ALT 0 - 44 U/L _1 Alk Phosphatase 38 - 126 U/L 64 64 69  Total Bilirubin 0.3 - 1.2 mg/dL 0.6 0.4 0.4    Lab Results  Component Value Date/Time   TSH 1.378 10/06/2020 06:06 PM   TSH 1.10 10/01/2020 12:11 PM   TSH 0.346  (L) 07/28/2020 12:26 AM   TSH 0.96 06/18/2020 10:55 AM   FREET4 1.03 10/06/2020 06:06 PM   FREET4 0.93 10/01/2020 12:11 PM    CBC Latest Ref Rng & Units 10/06/2020 10/01/2020 07/27/2020  WBC 4.0 - 10.5 K/uL 8.8 8.4 13.6(H)  Hemoglobin 12.0 - 15.0 g/dL 11.1(L) 12.0 10.0(L)  Hematocrit 36.0 - 46.0 % 35.2(L) 35.9(L) 29.9(L)  Platelets 150 - 400 K/uL 297 323.0 265    Lab Results  Component Value Date/Time   VD25OH 29.07 (L) 06/18/2020 10:55 AM   VD25OH 35.78 09/16/2019 02:42 PM    Clinical ASCVD: Yes  The 10-year ASCVD risk score Mikey Bussing DC Jr., et al., 2013) is: 34.5%   Values used to calculate the score:     Age: 80 years     Sex: Female     Is Non-Hispanic African American: No     Diabetic: Yes     Tobacco smoker: No     Systolic Blood Pressure: 443 mmHg     Is BP treated: No     HDL Cholesterol: 39.3 mg/dL     Total Cholesterol: 156 mg/dL    Depression screen [facility] 2/9 06/18/2020 05/29/2020 05/09/2020  Decreased Interest 0 1 1  Down, Depressed, Hopeless _2 PHQ - 2 Score _3 Altered sleeping 0 1 1  Tired, decreased energy 0 1 1  Change in appetite 1 2 0  Feeling bad or failure about yourself  0 1 0  Trouble concentrating 0 0 0  Moving slowly or fidgety/restless 0 0 0  Suicidal thoughts 0 0 1  PHQ-9 Score _4 Difficult doing work/chores - - -  Some recent data might be hidden    Social History   Tobacco Use  Smoking Status Never  Smokeless Tobacco Never   BP Readings from Last 3 Encounters:  10/07/20 123/66  10/01/20 134/78  08/31/20 130/70   Pulse Readings from Last 3 Encounters:  10/07/20 66  10/01/20 88  08/31/20 97   Wt Readings from Last 3 Encounters:  10/01/20 142 lb 2 oz (64.5 kg)  09/14/20 145 lb (65.8 kg)  08/31/20 145 lb (65.8 kg)   BMI Readings from Last 3 Encounters:  10/01/20 26.00 kg/m  09/14/20 26.52 kg/m  08/31/20 26.52 kg/m    Assessment/Interventions: Review of patient past medical history, allergies, medications, health  status, including review of consultants reports, laboratory and other test data, was performed as part of comprehensive evaluation and provision of chronic care management services.   SDOH:  (Social Determinants of Health) assessments and interventions performed: Yes SDOH Interventions    Flowsheet Row Most Recent Value  SDOH Interventions   Financial Strain Interventions Intervention Not Indicated      SDOH Screenings   Alcohol Screen:  Not on file  Depression (PHQ2-9): Low Risk    PHQ-2 Score: 2  Financial Resource Strain: Low Risk    Difficulty of Paying Living Expenses: Not very hard  Food Insecurity: No Food Insecurity   Worried About Charity fundraiser in the Last Year: Never true   Ran Out of Food in the Last Year: Never true  Housing: Low Risk    Last Housing Risk Score: 0  Physical Activity: Not on file  Social Connections: Not on file  Stress: Not on file  Tobacco Use: Low Risk    Smoking Tobacco Use: Never   Smokeless Tobacco Use: Never  Transportation Needs: No Transportation Needs   Lack of Transportation (Medical): No   Lack of Transportation (Non-Medical): No    CCM Care Plan  Allergies  Allergen Reactions   Sulfa Antibiotics Nausea Only   Crestor [Rosuvastatin Calcium] Other (See Comments)    Leg ache    Medications Reviewed Today     Reviewed by Debbora Dus, [facility] (Pharmacist) on 10/26/20 at [phone]  Med List Status: <None>   Medication Order Taking? Sig Documenting Provider Last Dose Status Informant  Accu-Chek FastClix Lancets MISC [id number] No USE AS INSTRUCTED TO CHECK BLOOD SUGAR ONCE DAILY  Patient not taking: Reported on 10/26/2020   [name], MD Not Taking Active   ACCU-CHEK GUIDE test strip [id number] No USE AS INSTRUCTED TO CHECK BLOOD SUGAR ONCE DAILY  Patient not taking: Reported on 10/26/2020   [name], MD Not Taking Active   aspirin EC 81 MG tablet [id number] Yes Take 81 mg by mouth at bedtime. [provider]  Taking Active Self  Blood Glucose Monitoring Suppl (ACCU-CHEK GUIDE) w/Device KIT [id number] No 1 each by Does not apply route as directed. Use as instructed to check blood sugar once daily  Patient not taking: Reported on 10/26/2020   [name], MD Not Taking Active   clonazePAM [facility]) 0.5 MG tablet [id number] No Take 0.5 tablets (0.25 mg total) by mouth in the morning AND 1 tablet (0.5 mg total) at bedtime.  Patient not taking: Reported on 10/26/2020   [name], MD Not Taking Active   Coenzyme Q10 (COQ10) 100 MG CAPS [id number] Yes Take 1 capsule by mouth at bedtime.  [provider] Taking Active Self  denosumab (PROLIA) 60 MG/ML SOSY injection [id number] Yes Inject 60 mg into the skin every 6 (six) months. [name], MD Taking Active   ezetimibe (ZETIA) 10 MG tablet [id number] Yes TAKE ONE TABLET BY MOUTH ONCE DAILY Jerline Pain, MD Taking Active   levothyroxine (SYNTHROID) 50 MCG tablet [id number] Yes Take 1 tablet (50 mcg total) by mouth daily. [name], MD Taking Active   loperamide (IMODIUM A-D) 2 MG tablet [id number] No Take 2 mg by mouth as needed.   Patient not taking: Reported on 10/26/2020   [provider] Not Taking Active   mirtazapine (REMERON) 15 MG tablet [id number] Yes Take 1 tablet (15 mg total) by mouth at bedtime. [name], MD Taking Active   nitroGLYCERIN (NITROLINGUAL) 0.4 MG/SPRAY spray [id number] No Place 1 spray under the tongue every 5 (five) minutes as needed.  Patient not taking: Reported on 10/26/2020   Jerline Pain, MD Not Taking Active   pravastatin (PRAVACHOL) 80 MG tablet [id number] Yes TAKE 1 TABLET BY MOUTH EVERY DAY IN THE Demetrius Charity, MD Taking Active   venlafaxine XR (EFFEXOR-XR) 150 MG 24 hr capsule [id number] Yes Take 1 capsule (150 mg total)  by mouth daily with breakfast. [name], MD Taking Active             Patient Active Problem List   Diagnosis Date Noted   Pre-op  evaluation 10/03/2020   Sensorineural hearing loss, bilateral 07/07/2020   Osteoarthritis of right hip 09/17/2019   Recurrent falls 09/15/2017   Tongue lesion 02/12/2017   Osteoarthritis 27/09/2374   Complicated grief 28/31/5176   Health maintenance examination 02/07/2016   Baker's cyst of knee 08/01/2015   Diabetes mellitus type 2, controlled, with complications ([place]) 16/10/3708   Subclavian steal syndrome    Medicare annual wellness visit, subsequent 01/31/2015   Advanced care planning/counseling discussion 01/31/2015   Carotid stenosis 01/31/2015   Vertebral artery stenosis/occlusion 01/31/2015   CKD (chronic kidney disease) stage 3, GFR 30-59 ml/min (HCC) 01/21/2015   Microscopic colitis 10/26/2014   Osteoporosis    Hypothyroidism    Dermatomyositis ([place])    MDD (major depressive disorder), recurrent severe, without psychosis ([place]) 01/12/2014   Atherosclerosis of native coronary artery of native heart without angina pectoris 12/13/2013   Hyperlipidemia associated with type 2 diabetes mellitus ([place]) 12/13/2013   Pseudocyst of pancreas 08/29/2011    Immunization History  Administered Date(s) Administered   Fluad Quad(high Dose 65+) 12/28/2018   Influenza, High Dose Seasonal PF 12/18/2017, 12/23/2018, 01/23/2020   Influenza,inj,Quad PF,6+ Mos 01/22/2015, 02/04/2016, 12/19/2016   Influenza-Unspecified 04/01/2011   Moderna SARS-COV2 Booster Vaccination 02/24/2020   Moderna Sars-Covid-2 Vaccination 04/29/2019, 05/27/2019   Pneumococcal Conjugate-13 02/04/2016   Pneumococcal-Unspecified 03/24/2007   Td 03/24/2007, 09/05/2016   Tdap 09/09/2017, 01/25/2018, 09/12/2019   Zoster Recombinat (Shingrix) 02/20/2017, 05/02/2017   Zoster, Live 04/14/2009    Conditions to be addressed/monitored:  Hyperlipidemia, Diabetes and Depression  Patient Care Plan: CCM Pharmacy Care Plan     Problem Identified: CHL AMB "PATIENT-SPECIFIC PROBLEM"      Long-Range Goal: [place]    Start Date: 08/01/2020  Priority: High  Note:   Current Barriers:   Medication adherence/confusion  Pharmacist Clinical Goal(s):   Patient will achieve adherence to monitoring guidelines and medication adherence to achieve therapeutic efficacy through collaboration with PharmD and provider.  Interventions:  1:1 collaboration with [name], MD regarding development and update of comprehensive plan of care as evidenced by provider attestation and co-signature  Inter-disciplinary care team collaboration (see longitudinal plan of care)  Comprehensive medication review performed; medication list updated in electronic medical record   Medication Adherence -Not ideal - multiple excess pill bottles on hand. Patient unable to state names of medications or directions. -Delivered pill packs today in hopes this will help. Her pack start day is 10/25/20.  Current medications: Breakfast -OTC Aspirin 81 mg daily (vial) -OTC CoQ10 100 mg daily (vial) -Levothyroxine 50 mcg daily (packs) -Venlafaxine XR 150 mg daily (packs) -Ezetimibe 10 mg daily (packs) Bedtime -Mirtazapine 15 mg daily (packs) -Pravastatin 80 mg daily (vial)  I wrote down exactly how to take her medications as above, she affirmed understanding. Multiple old prescription bottles were discarded to prevent confusion. She had excess of several medications indicated likely missed doses.  Plan: By next month all of patient's medications including OTCs will be in her pill packaging to help with adherence.   Hyperlipidemia/CAD: (LDL goal < 70) -Controlled - adequate, LDL 77   Update 10/23/20, pt appears to take below medications as prescribed. -Current treatment: Pravastatin 80 mg daily at bedtime Zetia 10 mg daily at breakfast OTC QoQ10 100 mg daily OTC Aspirin EC 81 mg  daily -Medications previously tried: Crestor, niacin  -Indicated for low dose aspirin  - ASCVD  -Recommended to continue current medication  Diabetes  (A1c goal <7%) -Controlled - A1c 6.8% -Rybelsus discontinued today 10/23/20. She had been taking the 3 mg dose for about 2 months.She never increased to the 7 mg dose, although she had an unopened 7 mg bottle on hand. Her A1c did improve during this time from 7.6 to 6.8%. However, to simplify regimen, reduce cost, and 3 mg dose lacking any clinical evidence, will trial off for now (PCP approved per telephone note). If A1c increases, consider restarting at 3 mg dose or alternative treatments. -Current medications:  None -Medications previously tried: none, avoided metformin due to GFR -Current home glucose readings - she was unable to locate her glucose monitor during out visit today -Due for annual foot exam. Eye exam up to date. -Benefits of routine self-monitoring of blood sugar.  -Recommended to limit carbohydrates. Remain off medication for now.  Depression/Anxiety (Goal: Control symptoms) -Controlled, patient has been doing a lot better lately -Current treatment: Mirtazapine 15 mg - 1 daily at bedtime  Venlafaxine XR 150 mg - 1 daily with breakfast  Clonazepam 0.5 mg - 1/2 tablet in morning and 1 in evening as needed (not taking) -Medications previously tried/failed: Citalopram -Adherence - Patient consistently took 1/2 tablet mirtazapine 30 mg despite dose increase several months ago so we changed her prescription back to 15 mg at bedtime for her pill packs. She is taking venlafaxine as prescribed. She does seem to miss doses often based on fill history, quantity on hand. - She also was completely out of her clonazepam today, empty bottle on hand. Reports she has been out for about 1 week and is doing well. This was recently reduced to 1/2 in AM and 1 in PM but I do not think she was able to make this change due to her current bottle on hand still said 1 in am and 1 in PM, difficult with self-admin of medications. -Recommended to continue current medications with venlafaxine and mirtazapine  in pill packs. She will remain off clonazepam for now. If we decide to restart this, will need to put in packs to ensure correct admin.   Patient Goals/Self-Care Activities  Patient will:  - focus on medication adherence by utilizing adherence packaging  Follow Up Plan: Telephone follow up appointment with care management team member scheduled for: 30 days    Medication Assistance: None required.  Patient affirms current coverage meets needs.   Patient's preferred pharmacy is: [facility] - [place], [place] - [address] [place] [address] Phone: [phone] Fax: [phone]  Starting adherence packaging on 10/25/20, delivered today.  Care Plan and Follow Up Patient Decision:  Patient agrees to Care Plan and Follow-up.  Debbora Dus, PharmD Clinical Pharmacist [place] [facility] at [facility] [phone]

## 2020-10-26 NOTE — Discharge Instructions (Addendum)
Your current condition warrants further evaluation and/or treatment which exceed services available to you in this urgent care setting. I have discussed with you your currrent condition and the need for further evaluation and/or treatment in an emergency department setting. In response to my medical recommendation, you have opted to go to the emergency department at: [facility] via EMS transport.

## 2020-10-26 NOTE — Telephone Encounter (Addendum)
Looks like pt currently in ER.  Chart reviewed - she stopped medications in last 2 days including antidepressants and BP meds. Has been off klonopin for 10 days per Michelle's note - last refilled 6/20. Anticipate stopping meds contributing to current symptoms.

## 2020-10-26 NOTE — ED Notes (Signed)
Patient is being discharged from the Urgent Care and sent to the Emergency Department via EMS . Per Erasmo Downer NP, patient is in need of higher level of care due to Urinary Symptoms and Confusion. Patient is aware and verbalizes understanding of plan of care.  Vitals:   10/26/20 1144  BP: (!) 170/76  Pulse: 84  Resp: 18  Temp: 98.3 F (36.8 C)  SpO2: 98%

## 2020-10-26 NOTE — Telephone Encounter (Signed)
Kathleen Williamson we do not. I am also sending this to Dr Darnell Level. She would need to be seen urgently at ER today I would advise.

## 2020-10-26 NOTE — Telephone Encounter (Signed)
Patient contacted me at 8:37 AM today. Reports confusion. She is not taking her medications. She reports family member told her the "4 antidepressants" she is on is causing the confusion so she stopped all of her medications (except OTCs - aspirin and CoQ10). She asked if I could come out to her house today but I am unable. I encouraged her to resume her medications as prescribed. Reviewed that she is prescribed 2 antidepressants and has been doing well on these prior to this episode. Confusion likely associated with not taking her medications. Also of note, she is completely out of her clonazepam and has been for > 1 week (verified during home visit on 7/12) so this is not due to over-medication, but potentially under-medication/withdrawal. Once she is back on her maintenance meds, we can re-eval and see if she needs the clonazepam since she has already been off this for 10+ days.   Due to recent cloudy urine report earlier this week and now confusion, recommend Urgent Care Visit to rule out UTI. Coordinated with social worker at [facility] for transportation to Urgent Care today. See telephone note on 10/24/20 for full detail.   Debbora Dus, PharmD Clinical Pharmacist [place] [facility] at [facility] [phone]

## 2020-10-26 NOTE — ED Triage Notes (Signed)
Patient c/o cloudy urine x 1 week.   Patient denies ABD pain, Back Pain, and Dysuria.   Patient is alert and oriented x 3.  Patient appears confused upon assessment.    Patient denies any medications used for symptoms.

## 2020-10-26 NOTE — ED Triage Notes (Signed)
Kathleen Williamson the director from [facility] is here with pt, She and the pt report that she is at [place] and alert and oriented times 4. She states that she went to urgent care because her urine was cloudy thought she may be starting with a UTI, she states that they would not give her water to help her get a sample and wanted to send her to the hospital to rule out uro sepsis.

## 2020-10-26 NOTE — Discharge Instructions (Addendum)
Follow up with primary care in about 10 days for a repeat urinalysis.  Return to the ER for symptoms that change or worsen if unable to schedule an appiontment.

## 2020-10-26 NOTE — ED Triage Notes (Signed)
Pt in via EMS from [place]. Pt resides at [facility]. Pt came from [facility] for possible UTI. Pt with cloudy urine. Pt denies pain. 98.3 temp. Pt not able to provide urine at [facility] so sent to the ED.

## 2020-10-26 NOTE — Telephone Encounter (Addendum)
Patient called me this morning. She is very confused, feels disoriented. She drove yesterday and got lost, was unable to find her way home. She did not take any of her medications yesterday or today. She states she is unable to drive. I'm going to call social worker at [facility] to see if they have transportation and can take her to Urgent Care, unless there is an opening here today?

## 2020-10-26 NOTE — Telephone Encounter (Signed)
Cherise, Education officer, museum, at [employer] called. States patient was unable to void right away at the urgent care so they sent her to the ED. ED is very backed up and unable to see patient any time soon. She wants to know if any appointments have opened up this afternoon.   Debbora Dus, PharmD Clinical Pharmacist [place] [facility] at [facility] [phone]

## 2020-10-26 NOTE — ED Notes (Signed)
Pt given water to obtain a UA

## 2020-10-26 NOTE — Telephone Encounter (Signed)
Spoke with Laurena Spies (phone # [phone]) at [facility]. She will coordinate transportation to the [facility]gent [facility] today and is calling to check on patient.  Debbora Dus, PharmD Clinical Pharmacist [place] [facility] at [facility] [phone]

## 2020-10-26 NOTE — Telephone Encounter (Signed)
Also of note, I visited patient in her home on 7/12 and dropped off her pill packs, reviewed all of her medications and discarded all of her old prescription bottles. She affirmed understanding of how to take pill packs at that time.

## 2020-10-26 NOTE — Patient Instructions (Signed)
Dear Kathleen Williamson,  Below is a summary of the goals we discussed during our follow up appointment on October 23, 2020. Please contact me anytime with questions or concerns.   Visit Information  Patient Care Plan: CCM Pharmacy Care Plan     Problem Identified: CHL AMB "PATIENT-SPECIFIC PROBLEM"      Long-Range Goal: [place]   Start Date: 08/01/2020  Priority: High  Note:   Current Barriers:   Medication adherence/confusion  Pharmacist Clinical Goal(s):   Patient will achieve adherence to monitoring guidelines and medication adherence to achieve therapeutic efficacy through collaboration with PharmD and provider.  Interventions:  1:1 collaboration with [name], MD regarding development and update of comprehensive plan of care as evidenced by provider attestation and co-signature  Inter-disciplinary care team collaboration (see longitudinal plan of care)  Comprehensive medication review performed; medication list updated in electronic medical record   Medication Adherence -Not ideal - multiple excess pill bottles on hand. Patient unable to state names of medications or directions. -Delivered pill packs today in hopes this will help. Her pack start day is 10/25/20.  Current medications: Breakfast -OTC Aspirin 81 mg daily (vial) -OTC CoQ10 100 mg daily (vial) -Levothyroxine 50 mcg daily (packs) -Venlafaxine XR 150 mg daily (packs) -Ezetimibe 10 mg daily (packs) Bedtime -Mirtazapine 15 mg daily (packs) -Pravastatin 80 mg daily (vial)  I wrote down exactly how to take her medications as above, she affirmed understanding. Multiple old prescription bottles were discarded to prevent confusion. She had excess of several medications indicated likely missed doses.  Plan: By next month all of patient's medications including OTCs will be in her pill packaging to help with adherence.   Hyperlipidemia/CAD: (LDL goal < 70) -Controlled - adequate, LDL 77   Update  10/23/20, pt appears to take below medications as prescribed. -Current treatment: Pravastatin 80 mg daily at bedtime Zetia 10 mg daily at breakfast OTC QoQ10 100 mg daily OTC Aspirin EC 81 mg daily -Medications previously tried: Crestor, niacin  -Indicated for low dose aspirin  - ASCVD  -Recommended to continue current medication  Diabetes (A1c goal <7%) -Controlled - A1c 6.8% -Rybelsus discontinued today 10/23/20. She had been taking the 3 mg dose for about 2 months.She never increased to the 7 mg dose, although she had an unopened 7 mg bottle on hand. Her A1c did improve during this time from 7.6 to 6.8%. However, to simplify regimen, reduce cost, and 3 mg dose lacking any clinical evidence, will trial off for now (PCP approved per telephone note). If A1c increases, consider restarting at 3 mg dose or alternative treatments. -Current medications:  None -Medications previously tried: none, avoided metformin due to GFR -Current home glucose readings - she was unable to locate her glucose monitor during out visit today -Due for annual foot exam. Eye exam up to date. -Benefits of routine self-monitoring of blood sugar.  -Recommended to limit carbohydrates. Remain off medication for now.  Depression/Anxiety (Goal: Control symptoms) -Controlled, patient has been doing a lot better lately -Current treatment: Mirtazapine 15 mg - 1 daily at bedtime  Venlafaxine XR 150 mg - 1 daily with breakfast  Clonazepam 0.5 mg - 1/2 tablet in morning and 1 in evening as needed (not taking) -Medications previously tried/failed: Citalopram -Adherence - Patient consistently took 1/2 tablet mirtazapine 30 mg despite dose increase several months ago so we changed her prescription back to 15 mg at bedtime for her pill packs. She is taking venlafaxine as prescribed. She does  seem to miss doses often based on fill history, quantity on hand. - She also was completely out of her clonazepam today, empty bottle on hand.  Reports she has been out for about 1 week and is doing well. This was recently reduced to 1/2 in AM and 1 in PM but I do not think she was able to make this change due to her current bottle on hand still said 1 in am and 1 in PM, difficult with self-admin of medications. -Recommended to continue current medications with venlafaxine and mirtazapine in pill packs. She will remain off clonazepam for now. If we decide to restart this, will need to put in packs to ensure correct admin.   Patient Goals/Self-Care Activities  Patient will:  - focus on medication adherence by utilizing adherence packaging  Follow Up Plan: Telephone follow up appointment with care management team member scheduled for: 30 days      The patient verbalized understanding of instructions, educational materials, and care plan provided today and declined offer to receive copy of patient instructions, educational materials, and care plan.   Debbora Dus, PharmD Clinical Pharmacist [facility] [phone]

## 2020-10-26 NOTE — ED Provider Notes (Signed)
Subjective:    Kathleen Williamson is a very pleasant 80 y.o. female who presents with concerns for UTI due to cloudy urine for the last 1 week.  Patient is not a good historian and reports feeling "off".  Patient states that she is "a little out of sorts".  She does not report any abdominal pain, dysuria, frequency or urgency of urination. No unilateral back pain, vomiting, fever, vaginal discharge.  Past medical history, past surgical history, current medications reviewed.  Allergies: is allergic to sulfa antibiotics and crestor [rosuvastatin calcium].  Review of Systems See HPI   Objective:     Vitals:   10/26/20 1144  BP: (!) 170/76  Pulse: 84  Resp: 18  Temp: 98.3 F (36.8 C)  SpO2: 98%     General: Appears well-developed and well-nourished. No acute distress.  Cardiovascular: Normal rate. Pulm/Chest: No respiratory distress. Abdominal: Soft, non-distended, and non-tender without rebound or guarding. Bowel tones normotonic in all quadrants. No masses or hepatosplenomegaly. No CVAT.  Neurological: Alert and oriented x 3. Skin: Skin is warm and dry.  Psychiatric: Normal mood, affect, behavior. Confused.   Laboratory:  Orders Placed This Encounter  Procedures   POCT urinalysis dipstick   No results found for any visits on 10/26/20.   Assessment:   1. Acute confusional state  2. Cloudy urine  No orders of the defined types were placed in this encounter.    Plan:   MDM: Patient presents with concerns for UTI due to cloudy urine for the last 1 week.  Patient is not a good historian and reports feeling "off".  Patient states that she is "a little out of sorts".  She does not report any abdominal pain, dysuria, frequency or urgency of urination. No unilateral back pain, vomiting, fever, vaginal discharge.  Chart review completed.  Patient is unable to give a urine specimen in clinic today and is quite confused without being able to give an accurate medical history to the  provider.  The patient's blood pressure in clinic today is elevated to 170/76 and the patient was transported here from her care facility and left the patient without a caregiver present to help obtain information.  I did call and speak with the caregiver who was unable to return to clinic to take the patient to the hospital for further investigation of potential urosepsis given her current state.  Caregiver advised that we needed to call and have a nonemergent EMS transport for the patient to the hospital.  Patient will present to the emergency department for further investigation.  Stable on discharge.    Discharge Instructions      Your current condition warrants further evaluation and/or treatment which exceed services available to you in this urgent care setting. I have discussed with you your currrent condition and the need for further evaluation and/or treatment in an emergency department setting. In response to my medical recommendation, you have opted to go to the emergency department at: [facility] via EMS transport.       Serafina Royals, FNP-C 10/26/20  A copy of these instructions was provided to the patient or responsible parent/guardian, who expressed understanding and agreed with the treatment plan.  All questions addressed.  This note was partially made with the aid of speech-to-text dictation; typographical errors are not intentional.    Serafina Royals, [place] 10/26/20 1215

## 2020-10-26 NOTE — Telephone Encounter (Signed)
We do not have any openings. Please have patient go to Urgent Care. Thank you.

## 2020-10-26 NOTE — Telephone Encounter (Signed)
They will wait at ED. Thanks!

## 2020-10-29 ENCOUNTER — Other Ambulatory Visit: Payer: Self-pay

## 2020-10-29 ENCOUNTER — Emergency Department
Admission: EM | Admit: 2020-10-29 | Discharge: 2020-10-29 | Disposition: A | Discharge status: Home / Self Care | Payer: PPO | Attending: Emergency Medicine | Admitting: Emergency Medicine

## 2020-10-29 DIAGNOSIS — E039 Hypothyroidism, unspecified: Secondary | ICD-10-CM | POA: Insufficient documentation

## 2020-10-29 DIAGNOSIS — R350 Frequency of micturition: Secondary | ICD-10-CM | POA: Diagnosis present

## 2020-10-29 DIAGNOSIS — N399 Disorder of urinary system, unspecified: Secondary | ICD-10-CM | POA: Insufficient documentation

## 2020-10-29 DIAGNOSIS — Z7982 Long term (current) use of aspirin: Secondary | ICD-10-CM | POA: Diagnosis not present

## 2020-10-29 DIAGNOSIS — E119 Type 2 diabetes mellitus without complications: Secondary | ICD-10-CM | POA: Diagnosis not present

## 2020-10-29 DIAGNOSIS — Z79899 Other long term (current) drug therapy: Secondary | ICD-10-CM | POA: Diagnosis not present

## 2020-10-29 DIAGNOSIS — Z955 Presence of coronary angioplasty implant and graft: Secondary | ICD-10-CM | POA: Diagnosis not present

## 2020-10-29 DIAGNOSIS — I2510 Atherosclerotic heart disease of native coronary artery without angina pectoris: Secondary | ICD-10-CM | POA: Insufficient documentation

## 2020-10-29 DIAGNOSIS — N183 Chronic kidney disease, stage 3 unspecified: Secondary | ICD-10-CM | POA: Diagnosis not present

## 2020-10-29 DIAGNOSIS — R39198 Other difficulties with micturition: Secondary | ICD-10-CM

## 2020-10-29 LAB — URINALYSIS, COMPLETE (UACMP) WITH MICROSCOPIC
Bacteria, UA: NONE SEEN
Bilirubin Urine: NEGATIVE
Glucose, UA: NEGATIVE mg/dL
Hgb urine dipstick: NEGATIVE
Ketones, ur: NEGATIVE mg/dL
Leukocytes,Ua: NEGATIVE
Nitrite: NEGATIVE
Protein, ur: NEGATIVE mg/dL
Specific Gravity, Urine: 1.011 (ref 1.005–1.030)
pH: 6 (ref 5.0–8.0)

## 2020-10-29 NOTE — ED Triage Notes (Addendum)
Patient has a UTI. Seen through ED on Friday and has been unable to get Rx to [facility].  C/O worsening symptoms.  Prescribed Cephalexin 500 mg BID x 7 days.

## 2020-10-29 NOTE — ED Provider Notes (Signed)
[facility] Emergency Department Provider Note ____________________________________________   Event Date/Time   First MD Initiated Contact with Patient 10/29/20 1645     (approximate)  I have reviewed the triage vital signs and the nursing notes.   HISTORY  Chief Complaint Dysuria  HPI Kathleen Williamson is a 80 y.o. female with history of CAD, CKD, diabetes and remaining history as listed below presents to the emergency department for treatment and evaluation of decrease in frequency of urination and suprapubic tenderness.  She was evaluated here few days ago and diagnosed with a UTI.  She was unable to get her medication and symptoms have gotten worse.  Visitor at bedside states that the medication arrived to the apartment just as they were leaving to come to the emergency department.  Patient denies known fever or back pain..         Past Medical History:  Diagnosis Date   Arthritis    fingers   CAD (coronary artery disease) 2003   MI s/p 5v CABG   Carotid stenosis    [place]   CKD (chronic kidney disease) stage 3, GFR 30-59 ml/min (HCC)    Dermatomyositis ([place])    saw Dr Estanislado Pandy, improved on its own   Diabetes mellitus without complication ([place])    Forehead laceration 09/15/2017   History of chicken pox    History of measles    History of migraine none since 2003   HLD (hyperlipidemia)    Hypothyroidism    MDD (major depressive disorder), recurrent episode, moderate ([place])    h/o SI after lost husband unexpectedly 2015   Microscopic colitis 05/2014   by colonoscopy, lymphocytic, zoloft related   Osteoporosis    DEXA 03/2013 T -3.1, DEXA 10//2016 -2.9 hip, -2.2 spine   Pancreatic cyst    Dr Ralene Ok, no f/u needed   Prediabetes 2014   Subclavian steal syndrome    Urine incontinence     Patient Active Problem List   Diagnosis Date Noted   Pre-op evaluation 10/03/2020   Sensorineural hearing loss, bilateral 07/07/2020   Osteoarthritis of  right hip 09/17/2019   Recurrent falls 09/15/2017   Tongue lesion 02/12/2017   Osteoarthritis 40/98/1191   Complicated grief 47/82/9562   Health maintenance examination 02/07/2016   Baker's cyst of knee 08/01/2015   Diabetes mellitus type 2, controlled, with complications ([place]) 13/11/6576   Subclavian steal syndrome    Medicare annual wellness visit, subsequent 01/31/2015   Advanced care planning/counseling discussion 01/31/2015   Carotid stenosis 01/31/2015   Vertebral artery stenosis/occlusion 01/31/2015   CKD (chronic kidney disease) stage 3, GFR 30-59 ml/min ([place]) 01/21/2015   Microscopic colitis 10/26/2014   Osteoporosis    Hypothyroidism    Dermatomyositis ([place])    MDD (major depressive disorder), recurrent severe, without psychosis ([place]) 01/12/2014   Atherosclerosis of native coronary artery of native heart without angina pectoris 12/13/2013   Hyperlipidemia associated with type 2 diabetes mellitus ([place]) 12/13/2013   Pseudocyst of pancreas 08/29/2011    Past Surgical History:  Procedure Laterality Date   APPENDECTOMY  1997   with hysterectomy   BACK SURGERY  1981   lower   BREAST LUMPECTOMY Left 1977   benign   COLONOSCOPY WITH PROPOFOL N/A 05/23/2014   microscopic colitis - Garlan Fair, MD   CORONARY ARTERY BYPASS GRAFT  2003   x 5 v   ESOPHAGOGASTRODUODENOSCOPY (EGD) WITH PROPOFOL N/A 05/23/2014   WNL Garlan Fair, MD   ROTATOR CUFF REPAIR Right  1990's   TOOTH EXTRACTION  yrs ago   TOTAL ABDOMINAL HYSTERECTOMY W/ BILATERAL SALPINGOOPHORECTOMY  1997   endometriosis    Prior to Admission medications   Medication Sig Start Date End Date Taking? Authorizing Provider  Accu-Chek FastClix Lancets MISC USE AS INSTRUCTED TO CHECK BLOOD SUGAR ONCE DAILY Patient not taking: No sig reported 10/13/19   [name], MD  ACCU-CHEK GUIDE test strip USE AS INSTRUCTED TO CHECK BLOOD SUGAR ONCE DAILY Patient not taking: No sig reported 10/10/19   [name], MD   aspirin EC 81 MG tablet Take 81 mg by mouth at bedtime.    [provider]  Blood Glucose Monitoring Suppl (ACCU-CHEK GUIDE) w/Device KIT 1 each by Does not apply route as directed. Use as instructed to check blood sugar once daily Patient not taking: No sig reported 07/14/19   [name], MD  cephALEXin (KEFLEX) 500 MG capsule Take 1 capsule (500 mg total) by mouth 2 (two) times daily for 7 days. 10/26/20 11/02/20  Concepcion Kirkpatrick, Dessa Phi, FNP  clonazePAM (KLONOPIN) 0.5 MG tablet Take 0.5 tablets (0.25 mg total) by mouth in the morning AND 1 tablet (0.5 mg total) at bedtime. Patient not taking: Reported on 10/26/2020 10/01/20   [name], MD  Coenzyme Q10 (COQ10) 100 MG CAPS Take 1 capsule by mouth at bedtime.     [provider]  denosumab (PROLIA) 60 MG/ML SOSY injection Inject 60 mg into the skin every 6 (six) months. 10/03/20   [name], MD  ezetimibe (ZETIA) 10 MG tablet TAKE ONE TABLET BY MOUTH ONCE DAILY 10/18/20   Jerline Pain, MD  levothyroxine (SYNTHROID) 50 MCG tablet Take 1 tablet (50 mcg total) by mouth daily. 06/18/20   [name], MD  loperamide (IMODIUM A-D) 2 MG tablet Take 2 mg by mouth as needed.  Patient not taking: No sig reported    [provider]  mirtazapine (REMERON) 15 MG tablet Take 1 tablet (15 mg total) by mouth at bedtime. 10/12/20   [name], MD  nitroGLYCERIN (NITROLINGUAL) 0.4 MG/SPRAY spray Place 1 spray under the tongue every 5 (five) minutes as needed. Patient not taking: No sig reported 05/23/16   Jerline Pain, MD  pravastatin (PRAVACHOL) 80 MG tablet TAKE 1 TABLET BY MOUTH EVERY DAY IN THE EVENING 09/19/20   Jerline Pain, MD  venlafaxine XR (EFFEXOR-XR) 150 MG 24 hr capsule Take 1 capsule (150 mg total) by mouth daily with breakfast. 10/12/20   [name], MD    Allergies Sulfa antibiotics and Crestor [rosuvastatin calcium]  Family History  Problem Relation Age of Onset   CAD Father 40   CAD  Mother 63   Diabetes Mother    Rheum arthritis Mother    Cancer Maternal Aunt        breast   CAD Other        strong paternal side   Stroke Neg Hx     Social History Social History   Tobacco Use   Smoking status: Never   Smokeless tobacco: Never  Vaping Use   Vaping Use: Never used  Substance Use Topics   Alcohol use: No   Drug use: No    Review of Systems  Constitutional: No fever/chills Eyes: No visual changes. ENT: No sore throat. Cardiovascular: Denies chest pain. Respiratory: Denies shortness of breath. Gastrointestinal: Positive for suprapubic abdominal pain.  No nausea, no vomiting.  No diarrhea.  No constipation. Genitourinary: Positive for dysuria.  Positive for decrease in  urinary frequency Musculoskeletal: Negative for back pain. Skin: Negative for rash. Neurological: Negative for headaches, focal weakness or numbness. ____________________________________________   PHYSICAL EXAM:  VITAL SIGNS: ED Triage Vitals  Enc Vitals Group     BP 10/29/20 1540 (!) 139/57     Pulse Rate 10/29/20 1540 72     Resp 10/29/20 1540 16     Temp 10/29/20 1540 98.6 F (37 C)     Temp Source 10/29/20 1540 Oral     SpO2 10/29/20 1540 99 %     Weight 10/29/20 1537 132 lb 0.9 oz (59.9 kg)     Height 10/29/20 1537 5' 3"  (1.6 m)     Head Circumference --      Peak Flow --      Pain Score 10/29/20 1537 0     Pain Loc --      Pain Edu? --      Excl. in [place]? --     Constitutional: Alert and oriented. Well appearing and in no acute distress. Eyes: Conjunctivae are normal. PERRL. EOMI. Head: Atraumatic. Nose: No congestion/rhinnorhea. Mouth/Throat: Mucous membranes are moist.  Oropharynx non-erythematous. Neck: No stridor.   Hematological/Lymphatic/Immunilogical: No cervical lymphadenopathy. Cardiovascular: Normal rate, regular rhythm. Grossly normal heart sounds.  Good peripheral circulation. Respiratory: Normal respiratory effort.  No retractions. Lungs  CTAB. Gastrointestinal: Soft and nontender. No distention. No abdominal bruits. No CVA tenderness. Genitourinary:  Musculoskeletal: No lower extremity tenderness nor edema.  No joint effusions. Neurologic:  Normal speech and language. No gross focal neurologic deficits are appreciated. No gait instability. Skin:  Skin is warm, dry and intact. No rash noted. Psychiatric: Mood and affect are normal. Speech and behavior are normal.  ____________________________________________   LABS (all labs ordered are listed, but only abnormal results are displayed)  Labs Reviewed  URINALYSIS, COMPLETE (UACMP) WITH MICROSCOPIC - Abnormal; Notable for the following components:      Result Value   Color, Urine YELLOW (*)    APPearance CLEAR (*)    All other components within normal limits   ____________________________________________  EKG  Not indicated ____________________________________________  RADIOLOGY  ED MD interpretation:    Not indicated I, Bodi Palmeri, personally viewed and evaluated these images (plain radiographs) as part of my medical decision making, as well as reviewing the written report by the radiologist.  Official radiology report(s): No results found.  ____________________________________________   PROCEDURES  Procedure(s) performed (including Critical Care):  Procedures  ____________________________________________   INITIAL IMPRESSION / ASSESSMENT AND PLAN     80 year old female presenting to the emergency department for evaluation after being diagnosed with a urinary tract infection 3 days ago.  At that time it she was given an injection of Rocephin and a prescription for Keflex was submitted to her pharmacy.  She has not been able to take any of the medication.  DIFFERENTIAL DIAGNOSIS  Acute cystitis, pyelonephritis, urinary retention  ED COURSE  Bladder scan 353 per NT. Patient states that she is unable to urinate and has not urinated since  before daylight this morning. Will have RN/NT do in and out cath for urine specimen for likely worsening acute cystitis.   Urine shows significant improvement from that collected on Friday. Since she received her antibiotic this afternoon, will have her complete the dose and follow up with primary care.    ___________________________________________   FINAL CLINICAL IMPRESSION(S) / ED DIAGNOSES  Final diagnoses:  Urinary dysfunction     ED Discharge Orders     None  LANESHA AZZARO was evaluated in Emergency Department on 10/29/2020 for the symptoms described in the history of present illness. She was evaluated in the context of the global COVID-19 pandemic, which necessitated consideration that the patient might be at risk for infection with the SARS-CoV-2 virus that causes COVID-19. Institutional protocols and algorithms that pertain to the evaluation of patients at risk for COVID-19 are in a state of rapid change based on information released by regulatory bodies including the CDC and federal and state organizations. These policies and algorithms were followed during the patient's care in the ED.   Note:  This document was prepared using Dragon voice recognition software and may include unintentional dictation errors.    Victorino Dike, FNP 10/29/20 1940    Lucrezia Starch, MD 10/29/20 2136

## 2020-10-29 NOTE — Discharge Instructions (Addendum)
Urine looks better today than last Friday. No need for change in antibiotics or injection of antibiotic today.  Take the antibiotic as prescribed.  Follow up with primary care for symptoms that are not improving over the next few days.

## 2020-10-29 NOTE — TOC Initial Note (Signed)
Transition of Care [facility]) - Initial/Assessment Note    Patient Details  Name: Kathleen Williamson MRN: [id number] Date of Birth: 27-May-1940  Transition of Care [facility]) CM/SW Contact:    Adelene Amas, [place] Phone Number: 10/29/2020, 4:04 PM  Clinical Narrative:                  Patient from [facility].       Patient Goals and CMS Choice        Expected Discharge Plan and Services                                                Prior Living Arrangements/Services                       Activities of Daily Living      Permission Sought/Granted                  Emotional Assessment              Admission diagnosis:  unable to void Patient Active Problem List   Diagnosis Date Noted   Pre-op evaluation 10/03/2020   Sensorineural hearing loss, bilateral 07/07/2020   Osteoarthritis of right hip 09/17/2019   Recurrent falls 09/15/2017   Tongue lesion 02/12/2017   Osteoarthritis 47/42/5956   Complicated grief 38/75/6433   Health maintenance examination 02/07/2016   Baker's cyst of knee 08/01/2015   Diabetes mellitus type 2, controlled, with complications ([place]) 29/51/8841   Subclavian steal syndrome    Medicare annual wellness visit, subsequent 01/31/2015   Advanced care planning/counseling discussion 01/31/2015   Carotid stenosis 01/31/2015   Vertebral artery stenosis/occlusion 01/31/2015   CKD (chronic kidney disease) stage 3, GFR 30-59 ml/min ([place]) 01/21/2015   Microscopic colitis 10/26/2014   Osteoporosis    Hypothyroidism    Dermatomyositis ([place])    MDD (major depressive disorder), recurrent severe, without psychosis ([place]) 01/12/2014   Atherosclerosis of native coronary artery of native heart without angina pectoris 12/13/2013   Hyperlipidemia associated with type 2 diabetes mellitus ([place]) 12/13/2013   Pseudocyst of pancreas 08/29/2011   PCP:  [name], MD Pharmacy:   [facility] - [place], [place] - [address] [place] [address] Phone: [phone] Fax: [phone]  [id number] - [address], [place] [address] Phone: [phone] Fax: [phone] - [place], [place] [address] Phone: [phone] Fax: [phone]     Social Determinants of Health (SDOH) Interventions    Readmission Risk Interventions No flowsheet data found.

## 2020-10-30 ENCOUNTER — Encounter: Payer: Self-pay | Admitting: Family Medicine

## 2020-10-30 ENCOUNTER — Other Ambulatory Visit: Payer: Self-pay

## 2020-10-30 ENCOUNTER — Ambulatory Visit (INDEPENDENT_AMBULATORY_CARE_PROVIDER_SITE_OTHER): Payer: PPO | Admitting: Family Medicine

## 2020-10-30 VITALS — BP 140/76 | HR 70 | Temp 98.6°F | Ht 63.0 in | Wt 138.0 lb

## 2020-10-30 DIAGNOSIS — N390 Urinary tract infection, site not specified: Secondary | ICD-10-CM

## 2020-10-30 DIAGNOSIS — R296 Repeated falls: Secondary | ICD-10-CM | POA: Diagnosis not present

## 2020-10-30 DIAGNOSIS — F332 Major depressive disorder, recurrent severe without psychotic features: Secondary | ICD-10-CM | POA: Diagnosis not present

## 2020-10-30 DIAGNOSIS — R197 Diarrhea, unspecified: Secondary | ICD-10-CM

## 2020-10-30 MED ORDER — CLONAZEPAM 0.5 MG PO TABS: 0.5000 mg | ORAL_TABLET | Freq: Every day | ORAL | 0 refills | Status: DC | PRN

## 2020-10-30 NOTE — Patient Instructions (Addendum)
Change klonopin to use 1 tablet only as needed for anxiety (sent to [facility]).  Finish keflex antibiotic course. Pass by lab for stool kit rule out infection.

## 2020-10-30 NOTE — Progress Notes (Signed)
Patient ID: Kathleen Williamson, female    DOB: [birth year], 80 y.o.   MRN: [id number]  This visit was conducted in person.  BP 140/76   Pulse 70   Temp 98.6 F (37 C) (Temporal)   Ht $R'5\' 3"'Qu$  (1.6 m)   Wt 138 lb (62.6 kg)   LMP  (LMP Unknown)   SpO2 96%   BMI 24.45 kg/m    CC: ER f/u visit  Subjective:   HPI: Kathleen Williamson is a 80 y.o. female presenting on 10/30/2020 for Follow-up (3 month fu depression.  )   Here with [name].   Several recent UCC and ER visits due to AMS in setting of UTI. Initially treated with rocephin $RemoveBefor'500mg'owJDaZiOvvXq$  IM 10/26/2020, was unable to fill keflex antibiotic over weekend ([facility]) so returned to ER on 10/29/2020 with urinary retention s/p I/O cath. Has now started keflex course and is feeling better, drinking plenty of water.   By the way - notes ongoing diarrhea over the past few weeks x1-2/day - with mucous but no blood. No associated nausea or abdominal pain. Has been managing with imodium or pepto bismol with benefit. No recent abx prior to this weekend. Notes certain diet choices exacerbate diarrhea - ie tomato soup.   She was also seen 10/06/2020 at ER with weakness and AMS. Head CT reassuringly ok (generalized atrophy) as well as labs.   Upcoming total hip replacement ([facility]). Taking tylenol for pain.   She has started using [facility] with pill packs with benefit. Feels this will help taking meds correct. She denies stopping medications suddenly last week. She has been out of klonopin for the past 1+ week, previously had tapered dose to 0.$RemoveB'25mg'AZMTnouG$  in am and 0.$Remove'5mg'Dxvzsjk$  in pm.      Relevant past medical, surgical, family and social history reviewed and updated as indicated. Interim medical history since our last visit reviewed. Allergies and medications reviewed and updated. Outpatient Medications Prior to Visit  Medication Sig Dispense Refill   Accu-Chek FastClix Lancets MISC USE AS INSTRUCTED TO CHECK BLOOD SUGAR ONCE DAILY 102 each 1   ACCU-CHEK  GUIDE test strip USE AS INSTRUCTED TO CHECK BLOOD SUGAR ONCE DAILY 100 strip 1   aspirin EC 81 MG tablet Take 81 mg by mouth at bedtime.     Blood Glucose Monitoring Suppl (ACCU-CHEK GUIDE) w/Device KIT 1 each by Does not apply route as directed. Use as instructed to check blood sugar once daily 1 kit 0   cephALEXin (KEFLEX) 500 MG capsule Take 1 capsule (500 mg total) by mouth 2 (two) times daily for 7 days. 14 capsule 0   Coenzyme Q10 (COQ10) 100 MG CAPS Take 1 capsule by mouth at bedtime.      denosumab (PROLIA) 60 MG/ML SOSY injection Inject 60 mg into the skin every 6 (six) months.     ezetimibe (ZETIA) 10 MG tablet TAKE ONE TABLET BY MOUTH ONCE DAILY 30 tablet 8   levothyroxine (SYNTHROID) 50 MCG tablet Take 1 tablet (50 mcg total) by mouth daily. 90 tablet 3   loperamide (IMODIUM A-D) 2 MG tablet Take 2 mg by mouth as needed.     mirtazapine (REMERON) 15 MG tablet Take 1 tablet (15 mg total) by mouth at bedtime. 30 tablet 6   nitroGLYCERIN (NITROLINGUAL) 0.4 MG/SPRAY spray Place 1 spray under the tongue every 5 (five) minutes as needed. 12 g prn   pravastatin (PRAVACHOL) 80 MG tablet TAKE 1 TABLET BY MOUTH EVERY DAY IN  THE EVENING 90 tablet 3   RYBELSUS 3 MG TABS Take 1 tablet by mouth every morning.     venlafaxine XR (EFFEXOR-XR) 150 MG 24 hr capsule Take 1 capsule (150 mg total) by mouth daily with breakfast. 90 capsule 1   clonazePAM (KLONOPIN) 0.5 MG tablet Take 0.5 tablets (0.25 mg total) by mouth in the morning AND 1 tablet (0.5 mg total) at bedtime.     No facility-administered medications prior to visit.     Per HPI unless specifically indicated in ROS section below Review of Systems  Objective:  BP 140/76   Pulse 70   Temp 98.6 F (37 C) (Temporal)   Ht $R'5\' 3"'ES$  (1.6 m)   Wt 138 lb (62.6 kg)   LMP  (LMP Unknown)   SpO2 96%   BMI 24.45 kg/m   Wt Readings from Last 3 Encounters:  10/30/20 138 lb (62.6 kg)  10/29/20 132 lb 0.9 oz (59.9 kg)  10/26/20 130 lb (59 kg)       Physical Exam Vitals and nursing note reviewed.  Constitutional:      Appearance: Normal appearance. She is not ill-appearing.  Cardiovascular:     Rate and Rhythm: Normal rate and regular rhythm.     Pulses: Normal pulses.     Heart sounds: Normal heart sounds. No murmur heard. Pulmonary:     Effort: Pulmonary effort is normal. No respiratory distress.     Breath sounds: Normal breath sounds. No wheezing, rhonchi or rales.  Abdominal:     General: There is no distension.     Palpations: Abdomen is soft.     Tenderness: There is no abdominal tenderness. There is no guarding or rebound.  Musculoskeletal:     Right lower leg: No edema.     Left lower leg: No edema.  Skin:    General: Skin is warm and dry.     Findings: No rash.  Neurological:     Mental Status: She is alert.  Psychiatric:        Mood and Affect: Mood normal.        Behavior: Behavior normal.     Comments: Difficulty with med rec      Results for orders placed or performed during the hospital encounter of 10/29/20  Urinalysis, Complete w Microscopic Urine, Clean Catch  Result Value Ref Range   Color, Urine YELLOW (A) YELLOW   APPearance CLEAR (A) CLEAR   Specific Gravity, Urine 1.011 1.005 - 1.030   pH 6.0 5.0 - 8.0   Glucose, UA NEGATIVE NEGATIVE mg/dL   Hgb urine dipstick NEGATIVE NEGATIVE   Bilirubin Urine NEGATIVE NEGATIVE   Ketones, ur NEGATIVE NEGATIVE mg/dL   Protein, ur NEGATIVE NEGATIVE mg/dL   Nitrite NEGATIVE NEGATIVE   Leukocytes,Ua NEGATIVE NEGATIVE   RBC / HPF 0-5 0 - 5 RBC/hpf   WBC, UA 0-5 0 - 5 WBC/hpf   Bacteria, UA NONE SEEN NONE SEEN   Squamous Epithelial / LPF 0-5 0 - 5   Mucus PRESENT    Hyaline Casts, UA PRESENT    Lab Results  Component Value Date   CREATININE 1.33 (H) 10/06/2020   BUN 11 10/06/2020   NA 137 10/06/2020   K 3.8 10/06/2020   CL 107 10/06/2020   CO2 22 10/06/2020    Lab Results  Component Value Date   HGBA1C 6.7 (H) 10/01/2020    Assessment & Plan:   This visit occurred during the SARS-CoV-2 public health emergency.  Safety protocols  were in place, including screening questions prior to the visit, additional usage of staff PPE, and extensive cleaning of exam room while observing appropriate contact time as indicated for disinfecting solutions.   Problem List Items Addressed This Visit     MDD (major depressive disorder), recurrent severe, without psychosis ([place])    Reviewed antidepressant regimen - she continues effexor XR $RemoveBe'150mg'CpdJgoKOD$  daily and remeron $RemoveBef'15mg'WNFZHhSeGh$  nightly. She has been out of klonopin - which may have contributed to AMS as well as UTI. Given overall stable period and she desires to stay off as many medications as possible, will change klonopin to 0.$RemoveBefor'5mg'UBfmrpehGnmY$  QD PRN anxiety. Extensively reviewed med changes with pt and sister in law [place].        Relevant Medications   clonazePAM (KLONOPIN) 0.5 MG tablet   Recurrent falls    No more falls since klonopin dose was tapered.        Diarrhea    Endorses several weeks of loose stools managing with imodium or pepto bismol. Will send GI pathogen panel r/o infection.        Relevant Orders   Gastrointestinal Pathogen Panel PCR   Complicated UTI (urinary tract infection) - Primary    UTI complicated by AMS.  Had difficulty filling keflex over weekend resulting in return visit to ER on Monday.  Now has keflex at home- has started taking. Advised she should finish antibiotic course, let us know if recurrent symptoms. In interim drink plenty of fluids and rest. Pt agrees with plan.          Meds ordered this encounter  Medications   clonazePAM (KLONOPIN) 0.5 MG tablet    Sig: Take 1 tablet (0.5 mg total) by mouth daily as needed for anxiety (sedation precautions).    Dispense:  30 tablet    Refill:  0    Orders Placed This Encounter  Procedures   Gastrointestinal Pathogen Panel PCR    Standing Status:   Future    Standing Expiration Date:   10/08/2021     Patient Instructions   Change klonopin to use 1 tablet only as needed for anxiety (sent to [facility]).  Finish keflex antibiotic course. Pass by lab for stool kit rule out infection.   Follow up plan: Return if symptoms worsen or fail to improve.  [name], MD

## 2020-10-31 ENCOUNTER — Encounter: Payer: Self-pay | Admitting: Family Medicine

## 2020-10-31 DIAGNOSIS — N390 Urinary tract infection, site not specified: Secondary | ICD-10-CM

## 2020-10-31 HISTORY — DX: Urinary tract infection, site not specified: N39.0

## 2020-10-31 NOTE — Assessment & Plan Note (Signed)
UTI complicated by AMS.  Had difficulty filling keflex over weekend resulting in return visit to ER on Monday.  Now has keflex at home- has started taking. Advised she should finish antibiotic course, let us know if recurrent symptoms. In interim drink plenty of fluids and rest. Pt agrees with plan.

## 2020-10-31 NOTE — Assessment & Plan Note (Signed)
Endorses several weeks of loose stools managing with imodium or pepto bismol. Will send GI pathogen panel r/o infection.

## 2020-10-31 NOTE — Assessment & Plan Note (Signed)
Reviewed antidepressant regimen - she continues effexor XR 150mg  daily and remeron 15mg  nightly. She has been out of klonopin - which may have contributed to AMS as well as UTI. Given overall stable period and she desires to stay off as many medications as possible, will change klonopin to 0.5mg  QD PRN anxiety. Extensively reviewed med changes with pt and sister in law [place].

## 2020-10-31 NOTE — Assessment & Plan Note (Signed)
No more falls since klonopin dose was tapered.

## 2020-11-01 ENCOUNTER — Other Ambulatory Visit (INDEPENDENT_AMBULATORY_CARE_PROVIDER_SITE_OTHER): Payer: PPO

## 2020-11-01 DIAGNOSIS — R197 Diarrhea, unspecified: Secondary | ICD-10-CM | POA: Diagnosis not present

## 2020-11-05 NOTE — Patient Instructions (Addendum)
DUE TO COVID-19 ONLY ONE VISITOR IS ALLOWED TO COME WITH YOU AND STAY IN THE WAITING ROOM ONLY DURING PRE OP AND PROCEDURE DAY OF SURGERY. THE 2 VISITORS  MAY VISIT WITH YOU AFTER SURGERY IN YOUR PRIVATE ROOM DURING VISITING HOURS ONLY!  YOU NEED TO HAVE A COVID 19 TEST ON_8/1______ '@__Between'$  8-3_____, THIS TEST MUST BE DONE BEFORE SURGERY,  [place] procedure is scheduled on: 11/14/20   Report to [facility]  Entrance   Report to admitting at   9:20 AM     Call this number if you have problems the morning of surgery [place], NO CHEWING GUM [place].   No food after midnight.    You may have clear liquid until 8:30 AM.    At 8:00 AM drink pre surgery drink.   Nothing by mouth after 8:30 AM.    Take these medicines the morning of surgery with A SIP OF WATER: Clonazepam, Effexor-XR, Levothyroxine                                 You may not have any metal on your body including hair pins and              piercings  Do not wear jewelry, make-up, lotions, powders or perfumes, deodorant             Do not wear nail polish on your fingernails.  Do not shave  48 hours prior to surgery.                 Do not bring valuables to the hospital. [place].  Contacts, dentures or bridgework may not be worn into surgery.                   Please read over the following fact sheets you were given: _____________________________________________________________________             [facility] - Preparing for Surgery Before surgery, you can play an important role.  Because skin is not sterile, your skin needs to be as free of germs as possible.  You can reduce the number of germs on your skin by washing with CHG (chlorahexidine gluconate) soap before surgery.  CHG is an antiseptic cleaner which kills germs  and bonds with the skin to continue killing germs even after washing. Please DO NOT use if you have an allergy to CHG or antibacterial soaps.  If your skin becomes reddened/irritated stop using the CHG and inform your nurse when you arrive at Short Stay. Do not shave (including legs and underarms) for at least 48 hours prior to the first CHG shower.   Please follow these instructions carefully:  1.  Shower with CHG Soap the night before surgery and the  morning of Surgery.  2.  If you choose to wash your hair, wash your hair first as usual with your  normal  shampoo.  3.  After you shampoo, rinse your hair and body thoroughly to remove the  shampoo.  4.  Use CHG as you would any other liquid soap.  You can apply chg directly  to the skin and wash                       Gently with a scrungie or clean washcloth.  5.  Apply the CHG Soap to your body ONLY FROM THE NECK DOWN.   Do not use on face/ open                           Wound or open sores. Avoid contact with eyes, ears mouth and genitals (private parts).                       Wash face,  Genitals (private parts) with your normal soap.             6.  Wash thoroughly, paying special attention to the area where your surgery  will be performed.  7.  Thoroughly rinse your body with warm water from the neck down.  8.  DO NOT shower/wash with your normal soap after using and rinsing off  the CHG Soap.             9.  Pat yourself dry with a clean towel.            10.  Wear clean pajamas.            11.  Place clean sheets on your bed the night of your first shower and do not  sleep with pets. Day of Surgery : Do not apply any lotions/deodorants the morning of surgery.  Please wear clean clothes to the hospital/surgery center.  FAILURE TO FOLLOW THESE INSTRUCTIONS MAY RESULT IN THE CANCELLATION OF YOUR SURGERY PATIENT SIGNATURE_________________________________  NURSE  SIGNATURE__________________________________  ________________________________________________________________________   Kathleen Williamson  An incentive spirometer is a tool that can help keep your lungs clear and active. This tool measures how well you are filling your lungs with each breath. Taking long deep breaths may help reverse or decrease the chance of developing breathing (pulmonary) problems (especially infection) following: A long period of time when you are unable to move or be active. BEFORE THE PROCEDURE  If the spirometer includes an indicator to show your best effort, your nurse or respiratory therapist will set it to a desired goal. If possible, sit up straight or lean slightly forward. Try not to slouch. Hold the incentive spirometer in an upright position. INSTRUCTIONS FOR USE  Sit on the edge of your bed if possible, or sit up as far as you can in bed or on a chair. Hold the incentive spirometer in an upright position. Breathe out normally. Place the mouthpiece in your mouth and seal your lips tightly around it. Breathe in slowly and as deeply as possible, raising the piston or the ball toward the top of the column. Hold your breath for 3-5 seconds or for as long as possible. Allow the piston or ball to fall to the bottom of the column. Remove the mouthpiece from your mouth and breathe out normally. Rest for a few seconds and repeat Steps 1 through 7 at least 10 times every 1-2 hours when you are awake. Take your time and take a few normal breaths between deep breaths. The spirometer may include an indicator to show your best effort. Use the indicator as a goal to work toward during each repetition. After  each set of 10 deep breaths, practice coughing to be sure your lungs are clear. If you have an incision (the cut made at the time of surgery), support your incision when coughing by placing a pillow or rolled up towels firmly against it. Once you are able to get out of  bed, walk around indoors and cough well. You may stop using the incentive spirometer when instructed by your caregiver.  RISKS AND COMPLICATIONS Take your time so you do not get dizzy or light-headed. If you are in pain, you may need to take or ask for pain medication before doing incentive spirometry. It is harder to take a deep breath if you are having pain. AFTER USE Rest and breathe slowly and easily. It can be helpful to keep track of a log of your progress. Your caregiver can provide you with a simple table to help with this. If you are using the spirometer at home, follow these instructions: [place] IF:  You are having difficultly using the spirometer. You have trouble using the spirometer as often as instructed. Your pain medication is not giving enough relief while using the spirometer. You develop fever of 100.5 F (38.1 C) or higher. SEEK IMMEDIATE MEDICAL CARE IF:  You cough up bloody sputum that had not been present before. You develop fever of 102 F (38.9 C) or greater. You develop worsening pain at or near the incision site. MAKE SURE YOU:  Understand these instructions. Will watch your condition. Will get help right away if you are not doing well or get worse. Document Released: 08/11/2006 Document Revised: 06/23/2011 Document Reviewed: 10/12/2006 [facility] Patient Information 2014 [place], [place].  How to Manage Your Diabetes Before and After Surgery  Why is it important to control my blood sugar before and after surgery? Improving blood sugar levels before and after surgery helps healing and can limit problems. A way of improving blood sugar control is eating a healthy diet by:  Eating less sugar and carbohydrates  Increasing activity/exercise  Talking with your doctor about reaching your blood sugar goals High blood sugars (greater than 180 mg/dL) can raise your risk of infections and slow your recovery, so you will need to focus on controlling your  diabetes during the weeks before surgery. Make sure that the doctor who takes care of your diabetes knows about your planned surgery including the date and location.  How do I manage my blood sugar before surgery? Check your blood sugar at least 4 times a day, starting 2 days before surgery, to make sure that the level is not too high or low. Check your blood sugar the morning of your surgery when you wake up and every 2 hours until you get to the Short Stay unit. If your blood sugar is less than 70 mg/dL, you will need to treat for low blood sugar: Do not take insulin. Treat a low blood sugar (less than 70 mg/dL) with  cup of clear juice (cranberry or apple), 4 glucose tablets, OR glucose gel. Recheck blood sugar in 15 minutes after treatment (to make sure it is greater than 70 mg/dL). If your blood sugar is not greater than 70 mg/dL on recheck, call [phone] for further instructions. Report your blood sugar to the short stay nurse when you get to Short Stay.  If you are admitted to the hospital after surgery: Your blood sugar will be checked by the staff and you will probably be given insulin after surgery (instead of oral diabetes medicines)  to make sure you have good blood sugar levels. The goal for blood sugar control after surgery is 80-180 mg/dL.   WHAT DO I DO ABOUT MY DIABETES MEDICATION?  Do not take oral diabetes medicines (pills) the morning of surgery.  THE NIGHT BEFORE SURGERY, take     units of       insulin.       THE MORNING OF SURGERY, take   units of         insulin.  The day of surgery, do not take other diabetes injectables, including Byetta (exenatide), Bydureon (exenatide ER), Victoza (liraglutide), or Trulicity (dulaglutide).  If your CBG is greater than 220 mg/dL, you may take  of your sliding scale  (correction) dose of insulin.     ________________________________________________________________________

## 2020-11-06 ENCOUNTER — Other Ambulatory Visit: Payer: Self-pay

## 2020-11-06 ENCOUNTER — Encounter (HOSPITAL_COMMUNITY): Payer: Self-pay

## 2020-11-06 ENCOUNTER — Encounter (HOSPITAL_COMMUNITY)
Admission: RE | Admit: 2020-11-06 | Discharge: 2020-11-06 | Disposition: A | Discharge status: Home / Self Care | Payer: PPO | Source: Ambulatory Visit | Attending: Orthopedic Surgery | Admitting: Orthopedic Surgery

## 2020-11-06 DIAGNOSIS — Z01812 Encounter for preprocedural laboratory examination: Secondary | ICD-10-CM | POA: Insufficient documentation

## 2020-11-06 LAB — COMPREHENSIVE METABOLIC PANEL
ALT: 13 U/L (ref 0–44)
AST: 16 U/L (ref 15–41)
Albumin: 4.2 g/dL (ref 3.5–5.0)
Alkaline Phosphatase: 50 U/L (ref 38–126)
Anion gap: 6 (ref 5–15)
BUN: 16 mg/dL (ref 8–23)
CO2: 25 mmol/L (ref 22–32)
Calcium: 9 mg/dL (ref 8.9–10.3)
Chloride: 111 mmol/L (ref 98–111)
Creatinine, Ser: 1.29 mg/dL — ABNORMAL HIGH (ref 0.44–1.00)
GFR, Estimated: 42 mL/min — ABNORMAL LOW (ref >60)
Glucose, Bld: 121 mg/dL — ABNORMAL HIGH (ref 70–99)
Potassium: 3.3 mmol/L — ABNORMAL LOW (ref 3.5–5.1)
Sodium: 142 mmol/L (ref 135–145)
Total Bilirubin: 0.6 mg/dL (ref 0.3–1.2)
Total Protein: 7 g/dL (ref 6.5–8.1)

## 2020-11-06 LAB — CBC
HCT: 37.6 % (ref 36.0–46.0)
Hemoglobin: 11.6 g/dL — ABNORMAL LOW (ref 12.0–15.0)
MCH: 27 pg (ref 26.0–34.0)
MCHC: 30.9 g/dL (ref 30.0–36.0)
MCV: 87.6 fL (ref 80.0–100.0)
Platelets: 305 10*3/uL (ref 150–400)
RBC: 4.29 MIL/uL (ref 3.87–5.11)
RDW: 13.9 % (ref 11.5–15.5)
WBC: 8.8 10*3/uL (ref 4.0–10.5)
nRBC: 0 % (ref 0.0–0.2)

## 2020-11-06 LAB — PROTIME-INR
INR: 0.9 (ref 0.8–1.2)
Prothrombin Time: 12.6 seconds (ref 11.4–15.2)

## 2020-11-06 LAB — GLUCOSE, CAPILLARY: Glucose-Capillary: 116 mg/dL — ABNORMAL HIGH (ref 70–99)

## 2020-11-06 LAB — SURGICAL PCR SCREEN
MRSA, PCR: NEGATIVE
Staphylococcus aureus: NEGATIVE

## 2020-11-06 LAB — APTT: aPTT: 28 seconds (ref 24–36)

## 2020-11-06 NOTE — Progress Notes (Addendum)
COVID Vaccine Completed:Yes  Date COVID Vaccine completed:05/27/19, booster 02/24/20, 08/31/19 COVID vaccine manufacturer:   Moderna     PCP - Dr. Biagio Borg [facility] 10/01/20 Cardiologist - Dr. Derl Barrow [place] 08/31/20 08/24/20 -epic  Chest x-ray - 10/01/20-epic EKG - 10/08/20-epic Stress Test - 09/14/20-epic ECHO - no Cardiac Cath - CABG x5 2003 Pacemaker/ICD device last checked:NA  Sleep Study - no CPAP -   Fasting Blood Sugar - Pt can't remember Checks Blood Sugar _____ times a day.2-3 times a month  Blood Thinner Instructions:ASA 81/ [place] Aspirin Instructions:continue / Dr. Danise Mina Last Dose:  Anesthesia review: yes  Patient denies shortness of breath, fever, cough and chest pain at PAT appointment Pt isn't very active and lives at [facility] but reports no SOB. She uses a cane.  Patient verbalized understanding of instructions that were given to them at the PAT appointment. Patient was also instructed that they will need to review over the PAT instructions again at home before surgery. Yes

## 2020-11-07 LAB — GASTROINTESTINAL PATHOGEN PANEL PCR

## 2020-11-08 ENCOUNTER — Telehealth: Payer: Self-pay

## 2020-11-08 ENCOUNTER — Telehealth: Payer: Self-pay | Admitting: Radiology

## 2020-11-08 NOTE — Anesthesia Preprocedure Evaluation (Addendum)
Anesthesia Evaluation  Patient identified by MRN, date of birth, ID band Patient awake    Reviewed: Allergy & Precautions, NPO status , Patient's Chart, lab work & pertinent test results  History of Anesthesia Complications Negative for: history of anesthetic complications  Airway Mallampati: I  TM Distance: >3 FB Neck ROM: Full    Dental  (+) Dental Advisory Given,    Pulmonary neg pulmonary ROS,  Covid-19 Nucleic Acid Test Results Lab Results      Component                Value               Date                      SARSCOV2NAA                                  11/12/2020            RESULT: NEGATIVE      SARSCOV2NAA              NEGATIVE            07/28/2020              breath sounds clear to auscultation       Cardiovascular (-) angina+ CAD, + Past MI and + CABG   Rhythm:Regular   ? The left ventricular ejection fraction is normal (55-65%). ? Nuclear stress EF: 61%. ? There was no ST segment deviation noted during stress. ? No T wave inversion was noted during stress. ? The study is normal. ? This is a low risk study.   1. Normal study without ischemia or infarction. 2. Normal LVEF, 61%. 3. This is a low risk study.    Neuro/Psych PSYCHIATRIC DISORDERS Depression negative neurological ROS     GI/Hepatic negative GI ROS, Neg liver ROS,   Endo/Other  diabetesHypothyroidism   Renal/GU CRFRenal diseaseLab Results      Component                Value               Date                      CREATININE               1.29 (H)            11/06/2020           Lab Results      Component                Value               Date                      K                        3.3 (L)             11/06/2020                Musculoskeletal  (+) Arthritis ,   Abdominal   Peds  Hematology negative hematology ROS (+) Lab Results      Component  Value               Date                      WBC                       8.8                 11/06/2020                HGB                      11.6 (L)            11/06/2020                HCT                      37.6                11/06/2020                MCV                      87.6                11/06/2020                PLT                      305                 11/06/2020           Lab Results      Component                Value               Date                      INR                      0.9                 11/06/2020                INR                      1.0                 10/06/2020                INR                      1.0                 10/01/2020           ptt 29   Denies blood thinners   Anesthesia Other Findings :80 y.o. never smoker with h/o CKD Stage III, DM II (A1C 6.7 10/01/2020), CAD (CABG 2003), right hip OA scheduled for above procedure 11/14/2020 with Dr. Gaynelle Arabian.  [name] 08/31/2020. Per OV note, "Given her prior coronary artery disease status post CABG in 2003, we will go ahead and proceed with Lexiscan stress test to ensure that she is of  appropriate risk for upcoming anesthesia/right hip arthroplasty. Overall she is not having any significant chest pain or shortness of breath. Dr. Wynelle Link."  Low risk stress test 09/14/2020.   Reproductive/Obstetrics                            Anesthesia Physical Anesthesia Plan  ASA: 3  Anesthesia Plan: MAC and Spinal   Post-op Pain Management:    Induction:   PONV Risk Score and Plan: 2 and Propofol infusion and Treatment may vary due to age or medical condition  Airway Management Planned: Nasal Cannula  Additional Equipment: None  Intra-op Plan:   Post-operative Plan:   Informed Consent: I have reviewed the patients History and Physical, chart, labs and discussed the procedure including the risks, benefits and alternatives for the proposed anesthesia with the patient or authorized representative who has indicated  his/her understanding and acceptance.     Dental advisory given  Plan Discussed with: CRNA and Anesthesiologist  Anesthesia Plan Comments: (See PAT note 11/06/2020, Konrad Felix, PA-C)       Anesthesia Quick Evaluation

## 2020-11-08 NOTE — Telephone Encounter (Signed)
Patient informed that her stool test needs to be repeated. [facility] canceled due to contaminants.  Patient will pick up container from front desk.

## 2020-11-08 NOTE — Progress Notes (Addendum)
Chronic Care Management Pharmacy Assistant   Name: Kathleen Williamson  MRN: [id number] DOB: January 28, 1941  Reason for Encounter: Diabetes   Recent office visits:  10/30/20- Dr.Gutierrez PCP - UTI/ED follow up - Refill Klonopin 0.$RemoveBeforeDEI'5mg'[id number]$  take 1 tablet at bedtime. Finish out cephalexin course.  Recent consult visits:  None in previous 6 months   Hospital visits:  10/29/20 - UTI - Cephalexin   Medications: Outpatient Encounter Medications as of 11/08/2020  Medication Sig   Accu-Chek FastClix Lancets MISC USE AS INSTRUCTED TO CHECK BLOOD SUGAR ONCE DAILY   ACCU-CHEK GUIDE test strip USE AS INSTRUCTED TO CHECK BLOOD SUGAR ONCE DAILY   acetaminophen (TYLENOL) 325 MG tablet Take 650 mg by mouth every 6 (six) hours as needed for moderate pain or headache.   aspirin EC 81 MG tablet Take 81 mg by mouth at bedtime.   Blood Glucose Monitoring Suppl (ACCU-CHEK GUIDE) w/Device KIT 1 each by Does not apply route as directed. Use as instructed to check blood sugar once daily   clonazePAM (KLONOPIN) 0.5 MG tablet Take 1 tablet (0.5 mg total) by mouth daily as needed for anxiety (sedation precautions). (Patient taking differently: Take 0.5 mg by mouth 2 (two) times daily.)   Coenzyme Q10 (COQ10) 100 MG CAPS Take 100 mg by mouth at bedtime.   denosumab (PROLIA) 60 MG/ML SOSY injection Inject 60 mg into the skin every 6 (six) months.   ezetimibe (ZETIA) 10 MG tablet TAKE ONE TABLET BY MOUTH ONCE DAILY (Patient taking differently: Take 10 mg by mouth daily.)   levothyroxine (SYNTHROID) 50 MCG tablet Take 1 tablet (50 mcg total) by mouth daily.   loperamide (IMODIUM A-D) 2 MG tablet Take 2 mg by mouth 4 (four) times daily as needed for diarrhea or loose stools.   mirtazapine (REMERON) 15 MG tablet Take 1 tablet (15 mg total) by mouth at bedtime.   nitroGLYCERIN (NITROLINGUAL) 0.4 MG/SPRAY spray Place 1 spray under the tongue every 5 (five) minutes as needed. (Patient taking differently: Place 1 spray under the tongue  every 5 (five) minutes as needed for chest pain.)   pravastatin (PRAVACHOL) 80 MG tablet TAKE 1 TABLET BY MOUTH EVERY DAY IN THE EVENING (Patient taking differently: Take 80 mg by mouth every evening.)   Semaglutide (RYBELSUS) 3 MG TABS Take 3 mg by mouth daily.   venlafaxine XR (EFFEXOR-XR) 150 MG 24 hr capsule Take 1 capsule (150 mg total) by mouth daily with breakfast.   No facility-administered encounter medications on file as of 11/08/2020.     Recent Relevant Labs: Lab Results  Component Value Date/Time   HGBA1C 6.7 (H) 10/01/2020 12:11 PM   HGBA1C 7.6 (H) 06/18/2020 10:55 AM   MICROALBUR 5.7 (H) 06/18/2020 10:55 AM   MICROALBUR 0.7 02/18/2019 09:44 AM    Kidney Function Lab Results  Component Value Date/Time   CREATININE 1.29 (H) 11/06/2020 01:44 PM   CREATININE 1.33 (H) 10/06/2020 06:06 PM   CREATININE 1.25 06/20/2014 12:00 AM   CREATININE 1.43 (H) 01/31/2014 07:25 PM   CREATININE 1.1 08/26/2011 09:27 AM   GFR 39.68 (L) 10/01/2020 12:11 PM   GFRNONAA 42 (L) 11/06/2020 01:44 PM   GFRNONAA 38 (L) 01/31/2014 07:25 PM   GFRAA 46 (L) 01/31/2014 07:25 PM     Contacted patient on 11/08/20 to discuss diabetes disease state.   Current antihyperglycemic regimen: None  What diet changes have been made to improve diabetes control? The patient reports she is limiting carbohydrates   What recent interventions/DTPs  have been made to improve glycemic control:  PCP/CCM stopped Rybelsus 3mg  June 2022 to simplify medications due to the administration requirements  Have there been any recent hospitalizations or ED visits since last visit with CPP? Yes 7/18/22[facility] ED  UA no admission  no medication changes 10/26/20- [facility]  UTI IM Rocephin 500mg  and sent home with Keflex 500mg  take 1 tablet 2 times daily   Patient denies hypoglycemic symptoms, including Pale, Sweaty, Shaky, Hungry, Nervous/irritable, and Vision changes  Patient denies hyperglycemic symptoms, including  blurry vision, excessive thirst, fatigue, and polyuria  How often are you checking your blood sugar? in the morning before eating or drinking  What are your blood sugars ranging?  Fasting: 11/07/20- 145    11/08/20- 135  During the week, how often does your blood glucose drop below 70? Never  Are you checking your feet daily/regularly? Yes  Adherence Review: Is the patient currently on a STATIN medication? Yes Is the patient currently on ACE/ARB medication? No Does the patient have >5 day gap between last estimated fill dates? No    Star Rating Drugs:  Medication:  Last Fill: Day Supply Pravastatin 80mg  09/19/20  90  No appointments scheduled within the next 30 days. The patient reports she will be having total hip replacement with Dr.Aluisio Wednesday 11/14/20 and will probably be in hospital for 2-3 days.  Debbora Dus, CPP notified  Avel Sensor, [place] Assistant [phone]  I have reviewed the care management and care coordination activities outlined in this encounter and I am certifying that I agree with the content of this note. No further action required.  Debbora Dus, PharmD Clinical Pharmacist [facility] at [facility] [phone]

## 2020-11-08 NOTE — Progress Notes (Signed)
Anesthesia Chart Review   Case: [id number] Date/Time: 11/14/20 1135   Procedure: TOTAL HIP ARTHROPLASTY ANTERIOR APPROACH (Right: Hip)   Anesthesia type: Choice   Pre-op diagnosis: Right hip osteoarthritis   Location: [facility] ROOM 10 / [facility]   Surgeons: Kathleen Arabian, MD       DISCUSSION:79 y.o. never smoker with h/o CKD Stage III, DM II (A1C 6.7 10/01/2020), CAD (CABG 2003), right hip OA scheduled for above procedure 11/14/2020 with Dr. Gaynelle Williamson.   [name] 08/31/2020. Per OV note, "Given her prior coronary artery disease status post CABG in 2003, we will go ahead and proceed with Lexiscan stress test to ensure that she is of appropriate risk for upcoming anesthesia/right hip arthroplasty.  Overall she is not having any significant chest pain or shortness of breath. Dr. Wynelle Williamson."  Low risk stress test 09/14/2020.   Anticipate pt can proceed with planned procedure barring acute status change.   VS: BP (!) 173/77   Pulse 73   Temp 36.8 C (Oral)   Resp 20   Ht 5' 3" (1.6 m)   Wt 59.9 kg   LMP  (LMP Unknown)   SpO2 100%   BMI 23.38 kg/m   PROVIDERS: Kathleen Bush, MD is PCP    LABS: Labs reviewed: Acceptable for surgery. (all labs ordered are listed, but only abnormal results are displayed)  Labs Reviewed  CBC - Abnormal; Notable for the following components:      Result Value   Hemoglobin 11.6 (*)    All other components within normal limits  COMPREHENSIVE METABOLIC PANEL - Abnormal; Notable for the following components:   Potassium 3.3 (*)    Glucose, Bld 121 (*)    Creatinine, Ser 1.29 (*)    GFR, Estimated 42 (*)    All other components within normal limits  GLUCOSE, CAPILLARY - Abnormal; Notable for the following components:   Glucose-Capillary 116 (*)    All other components within normal limits  SURGICAL PCR SCREEN  PROTIME-INR  APTT  TYPE AND SCREEN     IMAGES:   EKG: 10/08/2020 Rate 77 bpm   CV: Stress Test 09/14/2020 The left  ventricular ejection fraction is normal (55-65%). Nuclear stress EF: 61%. There was no ST segment deviation noted during stress. No T wave inversion was noted during stress. The study is normal. This is a low risk study.   1. Normal study without ischemia or infarction. 2. Normal LVEF, 61%. 3. This is a low risk study. Past Medical History:  Diagnosis Date   Arthritis    fingers   CAD (coronary artery disease) 2003   MI s/p 5v CABG   Carotid stenosis    [place]   CKD (chronic kidney disease) stage 3, GFR 30-59 ml/min (HCC)    Dermatomyositis ([place])    saw Dr Kathleen Williamson, improved on its own   Diabetes mellitus without complication ([place])    Forehead laceration 09/15/2017   History of chicken pox    History of measles    History of migraine none since 2003   HLD (hyperlipidemia)    Hypothyroidism    MDD (major depressive disorder), recurrent episode, moderate ([place])    h/o SI after lost husband unexpectedly 2015   Microscopic colitis 05/2014   by colonoscopy, lymphocytic, zoloft related   Myocardial infarction ([place]) 2003   Osteoporosis    DEXA 03/2013 T -3.1, DEXA 10//2016 -2.9 hip, -2.2 spine   Pancreatic cyst    Dr Ralene Ok, no f/u needed  Prediabetes 2014   Subclavian steal syndrome    Urine incontinence     Past Surgical History:  Procedure Laterality Date   APPENDECTOMY  1997   with hysterectomy   BACK SURGERY  1981   lower   BREAST LUMPECTOMY Left 1977   benign   COLONOSCOPY WITH PROPOFOL N/A 05/23/2014   microscopic colitis - Kathleen Fair, MD   CORONARY ARTERY BYPASS GRAFT  2003   x 5 v   ESOPHAGOGASTRODUODENOSCOPY (EGD) WITH PROPOFOL N/A 05/23/2014   [facility] Kathleen Fair, MD   ROTATOR CUFF REPAIR Right [phone]   TOOTH EXTRACTION  yrs ago   TOTAL ABDOMINAL HYSTERECTOMY W/ BILATERAL SALPINGOOPHORECTOMY  1997   endometriosis    MEDICATIONS:  Accu-Chek FastClix Lancets MISC   ACCU-CHEK GUIDE test strip   acetaminophen (TYLENOL) 325 MG tablet   aspirin EC  81 MG tablet   Blood Glucose Monitoring Suppl (ACCU-CHEK GUIDE) w/Device KIT   clonazePAM (KLONOPIN) 0.5 MG tablet   Coenzyme Q10 (COQ10) 100 MG CAPS   denosumab (PROLIA) 60 MG/ML SOSY injection   ezetimibe (ZETIA) 10 MG tablet   levothyroxine (SYNTHROID) 50 MCG tablet   loperamide (IMODIUM A-D) 2 MG tablet   mirtazapine (REMERON) 15 MG tablet   nitroGLYCERIN (NITROLINGUAL) 0.4 MG/SPRAY spray   pravastatin (PRAVACHOL) 80 MG tablet   Semaglutide (RYBELSUS) 3 MG TABS   venlafaxine XR (EFFEXOR-XR) 150 MG 24 hr capsule   No current facility-administered medications for this encounter.     Kathleen Felix, PA-C [facility] [phone]

## 2020-11-09 NOTE — Addendum Note (Signed)
Addended by: Debbora Dus on: 11/09/2020 08:34 AM   Modules accepted: Orders

## 2020-11-10 ENCOUNTER — Telehealth: Payer: Self-pay

## 2020-11-10 NOTE — Telephone Encounter (Signed)
Prolia VOB initiated via [email or web address]  Last OV: 10/30/20 Next OV: not scheduled Last Prolia inj: 07/24/20 Next Prolia inj DUE: 01/24/21

## 2020-11-12 ENCOUNTER — Other Ambulatory Visit: Payer: Self-pay | Admitting: Orthopedic Surgery

## 2020-11-13 LAB — SARS CORONAVIRUS 2 (TAT 6-24 HRS): SARS Coronavirus 2: NEGATIVE

## 2020-11-14 ENCOUNTER — Other Ambulatory Visit: Payer: Self-pay

## 2020-11-14 ENCOUNTER — Other Ambulatory Visit: Payer: PPO

## 2020-11-14 ENCOUNTER — Ambulatory Visit (HOSPITAL_COMMUNITY): Payer: PPO

## 2020-11-14 ENCOUNTER — Observation Stay (HOSPITAL_COMMUNITY)
Admission: RE | Admit: 2020-11-14 | Discharge: 2020-11-16 | Disposition: A | Discharge status: Home / Self Care | Payer: PPO | Attending: Orthopedic Surgery | Admitting: Orthopedic Surgery

## 2020-11-14 ENCOUNTER — Ambulatory Visit (HOSPITAL_COMMUNITY): Payer: PPO | Admitting: Certified Registered Nurse Anesthetist

## 2020-11-14 ENCOUNTER — Encounter (HOSPITAL_COMMUNITY)
Admission: RE | Disposition: A | Discharge status: Home / Self Care | Payer: Self-pay | Source: Home / Self Care | Attending: Orthopedic Surgery

## 2020-11-14 ENCOUNTER — Ambulatory Visit (HOSPITAL_COMMUNITY): Payer: PPO | Admitting: Physician Assistant

## 2020-11-14 ENCOUNTER — Encounter (HOSPITAL_COMMUNITY): Payer: Self-pay | Admitting: Orthopedic Surgery

## 2020-11-14 ENCOUNTER — Observation Stay (HOSPITAL_COMMUNITY): Payer: PPO

## 2020-11-14 ENCOUNTER — Telehealth: Payer: Self-pay

## 2020-11-14 DIAGNOSIS — Z471 Aftercare following joint replacement surgery: Secondary | ICD-10-CM | POA: Diagnosis not present

## 2020-11-14 DIAGNOSIS — R197 Diarrhea, unspecified: Secondary | ICD-10-CM

## 2020-11-14 DIAGNOSIS — Z96641 Presence of right artificial hip joint: Secondary | ICD-10-CM | POA: Diagnosis not present

## 2020-11-14 DIAGNOSIS — I2510 Atherosclerotic heart disease of native coronary artery without angina pectoris: Secondary | ICD-10-CM | POA: Diagnosis not present

## 2020-11-14 DIAGNOSIS — M1611 Unilateral primary osteoarthritis, right hip: Principal | ICD-10-CM | POA: Insufficient documentation

## 2020-11-14 DIAGNOSIS — Z951 Presence of aortocoronary bypass graft: Secondary | ICD-10-CM | POA: Diagnosis not present

## 2020-11-14 DIAGNOSIS — Z7982 Long term (current) use of aspirin: Secondary | ICD-10-CM | POA: Diagnosis not present

## 2020-11-14 DIAGNOSIS — Z96649 Presence of unspecified artificial hip joint: Secondary | ICD-10-CM

## 2020-11-14 DIAGNOSIS — Z79899 Other long term (current) drug therapy: Secondary | ICD-10-CM | POA: Insufficient documentation

## 2020-11-14 DIAGNOSIS — E039 Hypothyroidism, unspecified: Secondary | ICD-10-CM | POA: Diagnosis not present

## 2020-11-14 DIAGNOSIS — N183 Chronic kidney disease, stage 3 unspecified: Secondary | ICD-10-CM | POA: Diagnosis not present

## 2020-11-14 DIAGNOSIS — Z20822 Contact with and (suspected) exposure to covid-19: Secondary | ICD-10-CM | POA: Diagnosis not present

## 2020-11-14 DIAGNOSIS — E1122 Type 2 diabetes mellitus with diabetic chronic kidney disease: Secondary | ICD-10-CM | POA: Diagnosis not present

## 2020-11-14 DIAGNOSIS — Z419 Encounter for procedure for purposes other than remedying health state, unspecified: Secondary | ICD-10-CM

## 2020-11-14 HISTORY — PX: TOTAL HIP ARTHROPLASTY: SHX124

## 2020-11-14 LAB — TYPE AND SCREEN
ABO/RH(D): A POS
Antibody Screen: NEGATIVE

## 2020-11-14 LAB — GLUCOSE, CAPILLARY
Glucose-Capillary: 129 mg/dL — ABNORMAL HIGH (ref 70–99)
Glucose-Capillary: 150 mg/dL — ABNORMAL HIGH (ref 70–99)
Glucose-Capillary: 230 mg/dL — ABNORMAL HIGH (ref 70–99)
Glucose-Capillary: 239 mg/dL — ABNORMAL HIGH (ref 70–99)

## 2020-11-14 LAB — ABO/RH: ABO/RH(D): A POS

## 2020-11-14 SURGERY — ARTHROPLASTY, HIP, TOTAL, ANTERIOR APPROACH
Anesthesia: Monitor Anesthesia Care | Site: Hip | Laterality: Right

## 2020-11-14 MED ORDER — NITROGLYCERIN 0.4 MG/SPRAY TL SOLN
1.0000 | Status: DC | PRN
  Filled 2020-11-14: qty 12

## 2020-11-14 MED ORDER — TRANEXAMIC ACID-NACL 1000-0.7 MG/100ML-% IV SOLN
1000.0000 mg | INTRAVENOUS | Status: AC
  Administered 2020-11-14: 1000 mg via INTRAVENOUS
  Filled 2020-11-14: qty 100

## 2020-11-14 MED ORDER — ACETAMINOPHEN 10 MG/ML IV SOLN
1000.0000 mg | Freq: Four times a day (QID) | INTRAVENOUS | Status: DC
  Administered 2020-11-14: 1000 mg via INTRAVENOUS
  Filled 2020-11-14: qty 100

## 2020-11-14 MED ORDER — CHLORHEXIDINE GLUCONATE 0.12 % MT SOLN
15.0000 mL | Freq: Once | OROMUCOSAL | Status: AC
  Administered 2020-11-14: 15 mL via OROMUCOSAL

## 2020-11-14 MED ORDER — FENTANYL CITRATE (PF) 100 MCG/2ML IJ SOLN
INTRAMUSCULAR | Status: AC
  Filled 2020-11-14: qty 2

## 2020-11-14 MED ORDER — ONDANSETRON HCL 4 MG/2ML IJ SOLN
INTRAMUSCULAR | Status: DC | PRN
  Administered 2020-11-14: 4 mg via INTRAVENOUS

## 2020-11-14 MED ORDER — LACTATED RINGERS IV SOLN: INTRAVENOUS | Status: DC

## 2020-11-14 MED ORDER — RIVAROXABAN 10 MG PO TABS
10.0000 mg | ORAL_TABLET | Freq: Every day | ORAL | Status: DC
  Administered 2020-11-15 – 2020-11-16 (×2): 10 mg via ORAL
  Filled 2020-11-14 (×2): qty 1

## 2020-11-14 MED ORDER — LABETALOL HCL 5 MG/ML IV SOLN
INTRAVENOUS | Status: AC
  Filled 2020-11-14: qty 4

## 2020-11-14 MED ORDER — HYDRALAZINE HCL 20 MG/ML IJ SOLN
INTRAMUSCULAR | Status: AC
  Filled 2020-11-14: qty 1

## 2020-11-14 MED ORDER — POVIDONE-IODINE 10 % EX SWAB
2.0000 "application " | Freq: Once | CUTANEOUS | Status: AC
  Administered 2020-11-14: 2 via TOPICAL

## 2020-11-14 MED ORDER — OXYCODONE HCL 5 MG/5ML PO SOLN: 5.0000 mg | Freq: Once | ORAL | Status: DC | PRN

## 2020-11-14 MED ORDER — MENTHOL 3 MG MT LOZG: 1.0000 | LOZENGE | OROMUCOSAL | Status: DC | PRN

## 2020-11-14 MED ORDER — LABETALOL HCL 5 MG/ML IV SOLN
5.0000 mg | INTRAVENOUS | Status: AC | PRN
  Administered 2020-11-14 (×2): 5 mg via INTRAVENOUS

## 2020-11-14 MED ORDER — METOCLOPRAMIDE HCL 5 MG/ML IJ SOLN: 5.0000 mg | Freq: Three times a day (TID) | INTRAMUSCULAR | Status: DC | PRN

## 2020-11-14 MED ORDER — MIDAZOLAM HCL 2 MG/2ML IJ SOLN
INTRAMUSCULAR | Status: AC
  Filled 2020-11-14: qty 2

## 2020-11-14 MED ORDER — VENLAFAXINE HCL ER 150 MG PO CP24
150.0000 mg | ORAL_CAPSULE | Freq: Every day | ORAL | Status: DC
  Administered 2020-11-15 – 2020-11-16 (×2): 150 mg via ORAL
  Filled 2020-11-14 (×2): qty 1

## 2020-11-14 MED ORDER — PHENYLEPHRINE HCL-NACL 20-0.9 MG/250ML-% IV SOLN
INTRAVENOUS | Status: AC
  Filled 2020-11-14: qty 250

## 2020-11-14 MED ORDER — MIRTAZAPINE 15 MG PO TABS
15.0000 mg | ORAL_TABLET | Freq: Every day | ORAL | Status: DC
  Administered 2020-11-14 – 2020-11-15 (×2): 15 mg via ORAL
  Filled 2020-11-14 (×2): qty 1

## 2020-11-14 MED ORDER — PRAVASTATIN SODIUM 20 MG PO TABS
80.0000 mg | ORAL_TABLET | Freq: Every evening | ORAL | Status: DC
  Administered 2020-11-15: 80 mg via ORAL
  Filled 2020-11-14: qty 4

## 2020-11-14 MED ORDER — BUPIVACAINE HCL 0.25 % IJ SOLN
INTRAMUSCULAR | Status: AC
  Filled 2020-11-14: qty 1

## 2020-11-14 MED ORDER — BUPIVACAINE IN DEXTROSE 0.75-8.25 % IT SOLN
INTRATHECAL | Status: DC | PRN
  Administered 2020-11-14: 1.6 mL via INTRATHECAL

## 2020-11-14 MED ORDER — BUPIVACAINE HCL (PF) 0.75 % IJ SOLN
INTRAMUSCULAR | Status: DC | PRN
  Administered 2020-11-14: 1.6 mL

## 2020-11-14 MED ORDER — ACETAMINOPHEN 500 MG PO TABS: 1000.0000 mg | ORAL_TABLET | Freq: Once | ORAL | Status: DC | PRN

## 2020-11-14 MED ORDER — INSULIN ASPART 100 UNIT/ML IJ SOLN
0.0000 [IU] | Freq: Three times a day (TID) | INTRAMUSCULAR | Status: DC
  Administered 2020-11-14: 5 [IU] via SUBCUTANEOUS
  Administered 2020-11-15: 3 [IU] via SUBCUTANEOUS
  Administered 2020-11-15 – 2020-11-16 (×3): 2 [IU] via SUBCUTANEOUS
  Administered 2020-11-16: 3 [IU] via SUBCUTANEOUS

## 2020-11-14 MED ORDER — WATER FOR IRRIGATION, STERILE IR SOLN
Status: DC | PRN
  Administered 2020-11-14: 2000 mL

## 2020-11-14 MED ORDER — HYDRALAZINE HCL 20 MG/ML IJ SOLN
10.0000 mg | Freq: Four times a day (QID) | INTRAMUSCULAR | Status: DC | PRN
  Administered 2020-11-14: 10 mg via INTRAVENOUS

## 2020-11-14 MED ORDER — CEFAZOLIN SODIUM-DEXTROSE 2-4 GM/100ML-% IV SOLN
2.0000 g | Freq: Four times a day (QID) | INTRAVENOUS | Status: AC
  Administered 2020-11-14 (×2): 2 g via INTRAVENOUS
  Filled 2020-11-14 (×2): qty 100

## 2020-11-14 MED ORDER — DEXAMETHASONE SODIUM PHOSPHATE 10 MG/ML IJ SOLN
8.0000 mg | Freq: Once | INTRAMUSCULAR | Status: AC
  Administered 2020-11-14: 10 mg via INTRAVENOUS

## 2020-11-14 MED ORDER — PHENOL 1.4 % MT LIQD: 1.0000 | OROMUCOSAL | Status: DC | PRN

## 2020-11-14 MED ORDER — BUPIVACAINE HCL 0.25 % IJ SOLN
INTRAMUSCULAR | Status: DC | PRN
  Administered 2020-11-14: 35 mL

## 2020-11-14 MED ORDER — CLONAZEPAM 0.5 MG PO TABS
0.5000 mg | ORAL_TABLET | Freq: Two times a day (BID) | ORAL | Status: DC
  Administered 2020-11-14 – 2020-11-16 (×4): 0.5 mg via ORAL
  Filled 2020-11-14 (×4): qty 1

## 2020-11-14 MED ORDER — METHOCARBAMOL 500 MG IVPB - SIMPLE MED
500.0000 mg | Freq: Four times a day (QID) | INTRAVENOUS | Status: DC | PRN
  Filled 2020-11-14: qty 50

## 2020-11-14 MED ORDER — BISACODYL 10 MG RE SUPP: 10.0000 mg | Freq: Every day | RECTAL | Status: DC | PRN

## 2020-11-14 MED ORDER — ONDANSETRON HCL 4 MG PO TABS: 4.0000 mg | ORAL_TABLET | Freq: Four times a day (QID) | ORAL | Status: DC | PRN

## 2020-11-14 MED ORDER — PROPOFOL 500 MG/50ML IV EMUL
INTRAVENOUS | Status: DC | PRN
  Administered 2020-11-14: 100 ug/kg/min via INTRAVENOUS

## 2020-11-14 MED ORDER — INSULIN ASPART 100 UNIT/ML IJ SOLN
0.0000 [IU] | Freq: Every day | INTRAMUSCULAR | Status: DC
  Administered 2020-11-14: 2 [IU] via SUBCUTANEOUS

## 2020-11-14 MED ORDER — ORAL CARE MOUTH RINSE: 15.0000 mL | Freq: Once | OROMUCOSAL | Status: AC

## 2020-11-14 MED ORDER — METHOCARBAMOL 500 MG PO TABS
500.0000 mg | ORAL_TABLET | Freq: Four times a day (QID) | ORAL | Status: DC | PRN
  Administered 2020-11-14 – 2020-11-15 (×2): 500 mg via ORAL
  Filled 2020-11-14 (×2): qty 1

## 2020-11-14 MED ORDER — HYDROCODONE-ACETAMINOPHEN 5-325 MG PO TABS
1.0000 | ORAL_TABLET | ORAL | Status: DC | PRN
  Administered 2020-11-15 – 2020-11-16 (×5): 2 via ORAL
  Filled 2020-11-14 (×5): qty 2

## 2020-11-14 MED ORDER — ONDANSETRON HCL 4 MG/2ML IJ SOLN: 4.0000 mg | Freq: Four times a day (QID) | INTRAMUSCULAR | Status: DC | PRN

## 2020-11-14 MED ORDER — OXYCODONE HCL 5 MG PO TABS: 5.0000 mg | ORAL_TABLET | Freq: Once | ORAL | Status: DC | PRN

## 2020-11-14 MED ORDER — EZETIMIBE 10 MG PO TABS
10.0000 mg | ORAL_TABLET | Freq: Every day | ORAL | Status: DC
  Administered 2020-11-15 – 2020-11-16 (×2): 10 mg via ORAL
  Filled 2020-11-14 (×2): qty 1

## 2020-11-14 MED ORDER — METOCLOPRAMIDE HCL 5 MG PO TABS: 5.0000 mg | ORAL_TABLET | Freq: Three times a day (TID) | ORAL | Status: DC | PRN

## 2020-11-14 MED ORDER — 0.9 % SODIUM CHLORIDE (POUR BTL) OPTIME
TOPICAL | Status: DC | PRN
  Administered 2020-11-14: 1000 mL

## 2020-11-14 MED ORDER — ACETAMINOPHEN 160 MG/5ML PO SOLN: 1000.0000 mg | Freq: Once | ORAL | Status: DC | PRN

## 2020-11-14 MED ORDER — DOCUSATE SODIUM 100 MG PO CAPS
100.0000 mg | ORAL_CAPSULE | Freq: Two times a day (BID) | ORAL | Status: DC
  Administered 2020-11-14 – 2020-11-16 (×4): 100 mg via ORAL
  Filled 2020-11-14 (×4): qty 1

## 2020-11-14 MED ORDER — FENTANYL CITRATE (PF) 100 MCG/2ML IJ SOLN: 25.0000 ug | INTRAMUSCULAR | Status: DC | PRN

## 2020-11-14 MED ORDER — ACETAMINOPHEN 10 MG/ML IV SOLN: 1000.0000 mg | Freq: Once | INTRAVENOUS | Status: DC | PRN

## 2020-11-14 MED ORDER — MORPHINE SULFATE (PF) 2 MG/ML IV SOLN: 0.5000 mg | INTRAVENOUS | Status: DC | PRN

## 2020-11-14 MED ORDER — HYDROCODONE-ACETAMINOPHEN 7.5-325 MG PO TABS
1.0000 | ORAL_TABLET | ORAL | Status: DC | PRN
  Administered 2020-11-14: 2 via ORAL
  Filled 2020-11-14: qty 2

## 2020-11-14 MED ORDER — CEFAZOLIN SODIUM-DEXTROSE 2-4 GM/100ML-% IV SOLN
2.0000 g | INTRAVENOUS | Status: AC
  Administered 2020-11-14: 2 g via INTRAVENOUS
  Filled 2020-11-14: qty 100

## 2020-11-14 MED ORDER — SODIUM CHLORIDE 0.9 % IV SOLN: INTRAVENOUS | Status: DC

## 2020-11-14 MED ORDER — POLYETHYLENE GLYCOL 3350 17 G PO PACK: 17.0000 g | PACK | Freq: Every day | ORAL | Status: DC | PRN

## 2020-11-14 MED ORDER — LEVOTHYROXINE SODIUM 50 MCG PO TABS
50.0000 ug | ORAL_TABLET | Freq: Every day | ORAL | Status: DC
  Administered 2020-11-15 – 2020-11-16 (×2): 50 ug via ORAL
  Filled 2020-11-14 (×2): qty 1

## 2020-11-14 MED ORDER — ACETAMINOPHEN 325 MG PO TABS: 325.0000 mg | ORAL_TABLET | Freq: Four times a day (QID) | ORAL | Status: DC | PRN

## 2020-11-14 SURGICAL SUPPLY — 43 items
BAG COUNTER SPONGE SURGICOUNT (BAG) ×2 IMPLANT
BAG DECANTER FOR FLEXI CONT (MISCELLANEOUS) IMPLANT
BAG SPEC THK2 15X12 ZIP CLS (MISCELLANEOUS)
BAG SPNG CNTER NS LX DISP (BAG) ×2
BAG ZIPLOCK 12X15 (MISCELLANEOUS) IMPLANT
BLADE SAG 18X100X1.27 (BLADE) ×2 IMPLANT
COVER PERINEAL POST (MISCELLANEOUS) ×2 IMPLANT
COVER SURGICAL LIGHT HANDLE (MISCELLANEOUS) ×2 IMPLANT
CUP ACETBLR 48 OD SECTOR II (Hips) ×1 IMPLANT
DECANTER SPIKE VIAL GLASS SM (MISCELLANEOUS) ×2 IMPLANT
DRAPE FOOT SWITCH (DRAPES) ×2 IMPLANT
DRAPE STERI IOBAN 125X83 (DRAPES) ×2 IMPLANT
DRAPE U-SHAPE 47X51 STRL (DRAPES) ×4 IMPLANT
DRSG AQUACEL AG ADV 3.5X10 (GAUZE/BANDAGES/DRESSINGS) ×2 IMPLANT
DURAPREP 26ML APPLICATOR (WOUND CARE) ×2 IMPLANT
ELECT REM PT RETURN 15FT ADLT (MISCELLANEOUS) ×2 IMPLANT
GLOVE SRG 8 PF TXTR STRL LF DI (GLOVE) ×1 IMPLANT
GLOVE SURG ENC MOIS LTX SZ6.5 (GLOVE) ×2 IMPLANT
GLOVE SURG ENC MOIS LTX SZ7 (GLOVE) ×2 IMPLANT
GLOVE SURG ENC MOIS LTX SZ8 (GLOVE) ×4 IMPLANT
GLOVE SURG UNDER POLY LF SZ7 (GLOVE) ×2 IMPLANT
GLOVE SURG UNDER POLY LF SZ8 (GLOVE) ×2
GLOVE SURG UNDER POLY LF SZ8.5 (GLOVE) IMPLANT
GOWN STRL REUS W/TWL LRG LVL3 (GOWN DISPOSABLE) ×4 IMPLANT
GOWN STRL REUS W/TWL XL LVL3 (GOWN DISPOSABLE) IMPLANT
HEAD FEM STD 28X+1.5 STRL (Hips) ×1 IMPLANT
HOLDER FOLEY CATH W/STRAP (MISCELLANEOUS) ×2 IMPLANT
KIT TURNOVER KIT A (KITS) ×2 IMPLANT
LINER MARATHON 28 48 (Hips) ×1 IMPLANT
MANIFOLD NEPTUNE II (INSTRUMENTS) ×2 IMPLANT
PACK ANTERIOR HIP CUSTOM (KITS) ×2 IMPLANT
PENCIL SMOKE EVACUATOR COATED (MISCELLANEOUS) ×2 IMPLANT
STEM FEMORAL SZ5 HIGH ACTIS (Stem) ×1 IMPLANT
STRIP CLOSURE SKIN 1/2X4 (GAUZE/BANDAGES/DRESSINGS) ×3 IMPLANT
SUT ETHIBOND NAB CT1 #1 30IN (SUTURE) ×3 IMPLANT
SUT MNCRL AB 4-0 PS2 18 (SUTURE) ×2 IMPLANT
SUT STRATAFIX 0 PDS 27 VIOLET (SUTURE) ×2
SUT VIC AB 2-0 CT1 27 (SUTURE) ×4
SUT VIC AB 2-0 CT1 TAPERPNT 27 (SUTURE) ×2 IMPLANT
SUTURE STRATFX 0 PDS 27 VIOLET (SUTURE) ×1 IMPLANT
SYR 50ML LL SCALE MARK (SYRINGE) IMPLANT
TRAY FOLEY MTR SLVR 14FR STAT (SET/KITS/TRAYS/PACK) ×1 IMPLANT
TUBE SUCTION HIGH CAP CLEAR NV (SUCTIONS) ×2 IMPLANT

## 2020-11-14 NOTE — Progress Notes (Addendum)
Chronic Care Management Pharmacy Assistant   Name: Kathleen Williamson  MRN: [id number] DOB: 12-03-1940  Reason for Encounter: Medication Adherence and Delivery Coordination   Recent office visits:  11/08/20- CCM telephone call -discontinued Rybelsus per PCP consult  Recent consult visits:  None since last CCM contact  Hospital visits:  11/14/20- [facility] - Patient presented for total hip replacement surgery- admission  Medications: Outpatient Encounter Medications as of 11/14/2020  Medication Sig   Accu-Chek FastClix Lancets MISC USE AS INSTRUCTED TO CHECK BLOOD SUGAR ONCE DAILY   ACCU-CHEK GUIDE test strip USE AS INSTRUCTED TO CHECK BLOOD SUGAR ONCE DAILY   acetaminophen (TYLENOL) 325 MG tablet Take 650 mg by mouth every 6 (six) hours as needed for moderate pain or headache.   aspirin EC 81 MG tablet Take 81 mg by mouth at bedtime.   Blood Glucose Monitoring Suppl (ACCU-CHEK GUIDE) w/Device KIT 1 each by Does not apply route as directed. Use as instructed to check blood sugar once daily   clonazePAM (KLONOPIN) 0.5 MG tablet Take 1 tablet (0.5 mg total) by mouth daily as needed for anxiety (sedation precautions). (Patient taking differently: Take 0.5 mg by mouth 2 (two) times daily.)   Coenzyme Q10 (COQ10) 100 MG CAPS Take 100 mg by mouth at bedtime.   denosumab (PROLIA) 60 MG/ML SOSY injection Inject 60 mg into the skin every 6 (six) months.   ezetimibe (ZETIA) 10 MG tablet TAKE ONE TABLET BY MOUTH ONCE DAILY (Patient taking differently: Take 10 mg by mouth daily.)   levothyroxine (SYNTHROID) 50 MCG tablet Take 1 tablet (50 mcg total) by mouth daily.   loperamide (IMODIUM A-D) 2 MG tablet Take 2 mg by mouth 4 (four) times daily as needed for diarrhea or loose stools.   mirtazapine (REMERON) 15 MG tablet Take 1 tablet (15 mg total) by mouth at bedtime.   nitroGLYCERIN (NITROLINGUAL) 0.4 MG/SPRAY spray Place 1 spray under the tongue every 5 (five) minutes as needed. (Patient  taking differently: Place 1 spray under the tongue every 5 (five) minutes as needed for chest pain.)   pravastatin (PRAVACHOL) 80 MG tablet TAKE 1 TABLET BY MOUTH EVERY DAY IN THE EVENING (Patient taking differently: Take 80 mg by mouth every evening.)   venlafaxine XR (EFFEXOR-XR) 150 MG 24 hr capsule Take 1 capsule (150 mg total) by mouth daily with breakfast.   No facility-administered encounter medications on file as of 11/14/2020.   BP Readings from Last 3 Encounters:  11/06/20 (!) 173/77  10/30/20 140/76  10/29/20 136/61    Lab Results  Component Value Date   HGBA1C 6.7 (H) 10/01/2020     Last adherence delivery date: 10/23/2020      Patient is due for next adherence delivery on: 11/21/2020  Unable to speak with the patient due to she is having surgery for total hip replacement.  This delivery to include: Adherence Packaging  30 Days  Packs: Ezetimibe 10 mg - take 1 tablet breakfast Mirtazapine 15 mg - take 1 tablet bedtime  Levothyroxine 50 mcg - take 1 tablet breakfast Venlafaxine XR 150 mg - take 1 tablet breakfast Pravastatin 80 mg - take 1 tablet bedtime OTC CoQ10 100 mg -  take 1 tablet bedtime OTC aspirin 81 mg - take 1 tablet bedtime  No refill request needed from PCP.  Delivery scheduled for 11/21/2020. Unable to speak with patient to confirm date.   Debbora Dus, CPP notified  Avel Sensor, [place] Assistant [phone]  I have reviewed the care management and care coordination activities outlined in this encounter and I am certifying that I agree with the content of this note. No further action required.  Debbora Dus, PharmD Clinical Pharmacist [facility] at [facility] [phone]

## 2020-11-14 NOTE — Discharge Instructions (Addendum)
Kathleen Arabian, MD Total Joint Specialist [facility] [address], [address] [place], [place] [address] [phone]  ANTERIOR APPROACH TOTAL HIP REPLACEMENT POSTOPERATIVE DIRECTIONS     Hip Rehabilitation, Guidelines Following Surgery  The results of a hip operation are greatly improved after range of motion and muscle strengthening exercises. Follow all safety measures which are given to protect your hip. If any of these exercises cause increased pain or swelling in your joint, decrease the amount until you are comfortable again. Then slowly increase the exercises. Call your caregiver if you have problems or questions.   BLOOD CLOT PREVENTION Take a 10 mg Xarelto once a day for three weeks following surgery. Then resume one 81 mg aspirin once a day. You may resume your vitamins/supplements once you have discontinued the Xarelto. Do not take any NSAIDs (Advil, Aleve, Ibuprofen, Meloxicam, etc.) until you have discontinued the Xarelto.   HOME CARE INSTRUCTIONS  Remove items at home which could result in a fall. This includes throw rugs or furniture in walking pathways.  ICE to the affected hip as frequently as 20-30 minutes an hour and then as needed for pain and swelling. Continue to use ice on the hip for pain and swelling from surgery. You may notice swelling that will progress down to the foot and ankle. This is normal after surgery. Elevate the leg when you are not up walking on it.   Continue to use the breathing machine which will help keep your temperature down.  It is common for your temperature to cycle up and down following surgery, especially at night when you are not up moving around and exerting yourself.  The breathing machine keeps your lungs expanded and your temperature down.  DIET You may resume your previous home diet once your are discharged from the hospital.  DRESSING / WOUND CARE / SHOWERING You have an adhesive waterproof bandage over the  incision. Leave this in place until your first follow-up appointment. Once you remove this you will not need to place another bandage.  You may begin showering 3 days following surgery, but do not submerge the incision under water.  ACTIVITY For the first 3-5 days, it is important to rest and keep the operative leg elevated. You should, as a general rule, rest for 50 minutes and walk/stretch for 10 minutes per hour. After 5 days, you may slowly increase activity as tolerated.  Perform the exercises you were provided twice a day for about 15-20 minutes each session. Begin these 2 days following surgery. Walk with your walker as instructed. Use the walker until you are comfortable transitioning to a cane. Walk with the cane in the opposite hand of the operative leg. You may discontinue the cane once you are comfortable and walking steadily. Avoid periods of inactivity such as sitting longer than an hour when not asleep. This helps prevent blood clots.  Do not drive a car for 6 weeks or until released by your surgeon.  Do not drive while taking narcotics.  TED HOSE STOCKINGS Wear the elastic stockings on both legs for three weeks following surgery during the day. You may remove them at night while sleeping.  WEIGHT BEARING Weight bearing as tolerated with assist device (walker, cane, etc) as directed, use it as long as suggested by your surgeon or therapist, typically at least 4-6 weeks.  POSTOPERATIVE CONSTIPATION PROTOCOL Constipation - defined medically as fewer than three stools per week and severe constipation as less than one stool per week.  less than one stool per week.  One of the most common issues patients have following surgery is constipation.  Even if you have a regular bowel pattern at home, your normal regimen is likely to be disrupted due to multiple reasons following surgery.  Combination of anesthesia, postoperative narcotics, change in appetite and fluid intake all can  affect your bowels.  In order to avoid complications following surgery, here are some recommendations in order to help you during your recovery period.  Colace (docusate) - Pick up an over-the-counter form of Colace or another stool softener and take twice a day as long as you are requiring postoperative pain medications.  Take with a full glass of water daily.  If you experience loose stools or diarrhea, hold the colace until you stool forms back up.  If your symptoms do not get better within 1 week or if they get worse, check with your doctor. Dulcolax (bisacodyl) - Pick up over-the-counter and take as directed by the product packaging as needed to assist with the movement of your bowels.  Take with a full glass of water.  Use this product as needed if not relieved by Colace only.  MiraLax (polyethylene glycol) - Pick up over-the-counter to have on hand.  MiraLax is a solution that will increase the amount of water in your bowels to assist with bowel movements.  Take as directed and can mix with a glass of water, juice, soda, coffee, or tea.  Take if you go more than two days without a movement.Do not use MiraLax more than once per day. Call your doctor if you are still constipated or irregular after using this medication for 7 days in a row.  If you continue to have problems with postoperative constipation, please contact the office for further assistance and recommendations.  If you experience "the worst abdominal pain ever" or develop nausea or vomiting, please contact the office immediatly for further recommendations for treatment.  ITCHING  If you experience itching with your medications, try taking only a single pain pill, or even half a pain pill at a time.  You can also use Benadryl over the counter for itching or also to help with sleep.   MEDICATIONS See your medication summary on the "After Visit Summary" that the nursing staff will review with you prior to discharge.  You may have some home  medications which will be placed on hold until you complete the course of blood thinner medication.  It is important for you to complete the blood thinner medication as prescribed by your surgeon.  Continue your approved medications as instructed at time of discharge.  PRECAUTIONS If you experience chest pain or shortness of breath - call 911 immediately for transfer to the hospital emergency department.  If you develop a fever greater that 101 F, purulent drainage from wound, increased redness or drainage from wound, foul odor from the wound/dressing, or calf pain - CONTACT YOUR SURGEON.                                                   FOLLOW-UP APPOINTMENTS Make sure you keep all of your appointments after your operation with your surgeon and caregivers. You should call the office at the above phone number and make an appointment for approximately two weeks after the date of your surgery or on the   date instructed by your surgeon outlined in the "After Visit Summary".  RANGE OF MOTION AND STRENGTHENING EXERCISES  These exercises are designed to help you keep full movement of your hip joint. Follow your caregiver's or physical therapist's instructions. Perform all exercises about fifteen times, three times per day or as directed. Exercise both hips, even if you have had only one joint replacement. These exercises can be done on a training (exercise) mat, on the floor, on a table or on a bed. Use whatever works the best and is most comfortable for you. Use music or television while you are exercising so that the exercises are a pleasant break in your day. This will make your life better with the exercises acting as a break in routine you can look forward to.  Lying on your back, slowly slide your foot toward your buttocks, raising your knee up off the floor. Then slowly slide your foot back down until your leg is straight again.  Lying on your back spread your legs as far apart as you can without causing  discomfort.  Lying on your side, raise your upper leg and foot straight up from the floor as far as is comfortable. Slowly lower the leg and repeat.  Lying on your back, tighten up the muscle in the front of your thigh (quadriceps muscles). You can do this by keeping your leg straight and trying to raise your heel off the floor. This helps strengthen the largest muscle supporting your knee.  Lying on your back, tighten up the muscles of your buttocks both with the legs straight and with the knee bent at a comfortable angle while keeping your heel on the floor.   POST-OPERATIVE OPIOID TAPER INSTRUCTIONS: It is important to wean off of your opioid medication as soon as possible. If you do not need pain medication after your surgery it is ok to stop day one. Opioids include: Codeine, Hydrocodone(Norco, Vicodin), Oxycodone(Percocet, oxycontin) and hydromorphone amongst others.  Long term and even short term use of opiods can cause: Increased pain response Dependence Constipation Depression Respiratory depression And more.  Withdrawal symptoms can include Flu like symptoms Nausea, vomiting And more Techniques to manage these symptoms Hydrate well Eat regular healthy meals Stay active Use relaxation techniques(deep breathing, meditating, yoga) Do Not substitute Alcohol to help with tapering If you have been on opioids for less than two weeks and do not have pain than it is ok to stop all together.  Plan to wean off of opioids This plan should start within one week post op of your joint replacement. Maintain the same interval or time between taking each dose and first decrease the dose.  Cut the total daily intake of opioids by one tablet each day Next start to increase the time between doses. The last dose that should be eliminated is the evening dose.   IF YOU ARE TRANSFERRED TO A SKILLED REHAB FACILITY If the patient is transferred to a skilled rehab facility following release from the  hospital, a list of the current medications will be sent to the facility for the patient to continue.  When discharged from the skilled rehab facility, please have the facility set up the patient's Home Health Physical Therapy prior to being released. Also, the skilled facility will be responsible for providing the patient with their medications at time of release from the facility to include their pain medication, the muscle relaxants, and their blood thinner medication. If the patient is still at the rehab facility   up appointment, the skilled rehab facility will also need to assist the patient in arranging follow up appointment in our office and any transportation needs.  MAKE SURE YOU:  Understand these instructions.  Get help right away if you are not doing well or get worse.    DENTAL ANTIBIOTICS:  In most cases prophylactic antibiotics for Dental procdeures after total joint surgery are not necessary.  Exceptions are as follows:  1. History of prior total joint infection  2. Severely immunocompromised (Organ Transplant, cancer chemotherapy, Rheumatoid biologic meds such as [place])  3. Poorly controlled diabetes (A1C &gt; 8.0, blood glucose over 200)  If you have one of these conditions, contact your surgeon for an antibiotic prescription, prior to your dental procedure.    Pick up stool softner and laxative for home use following surgery while on pain medications. Do not submerge incision under water. Please use good hand washing techniques while changing dressing each day. May shower starting three days after surgery. Please use a clean towel to pat the incision dry following showers. Continue to use ice for pain and swelling after surgery. Do not use any lotions or creams on the incision until instructed by your surgeon.  Information on my medicine - XARELTO (Rivaroxaban)  This medication education was reviewed with me or my healthcare representative as part of my discharge  preparation.  The pharmacist that spoke with me during my hospital stay was:    Why was Xarelto prescribed for you? Xarelto was prescribed for you to reduce the risk of blood clots forming after orthopedic surgery. The medical term for these abnormal blood clots is venous thromboembolism (VTE).  What do you need to know about xarelto ? Take your Xarelto ONCE DAILY at the same time every day. You may take it either with or without food.  If you have difficulty swallowing the tablet whole, you may crush it and mix in applesauce just prior to taking your dose.  Take Xarelto exactly as prescribed by your doctor and DO NOT stop taking Xarelto without talking to the doctor who prescribed the medication.  Stopping without other VTE prevention medication to take the place of Xarelto may increase your risk of developing a clot.  After discharge, you should have regular check-up appointments with your healthcare provider that is prescribing your Xarelto.    What do you do if you miss a dose? If you miss a dose, take it as soon as you remember on the same day then continue your regularly scheduled once daily regimen the next day. Do not take two doses of Xarelto on the same day.   Important Safety Information A possible side effect of Xarelto is bleeding. You should call your healthcare provider right away if you experience any of the following: Bleeding from an injury or your nose that does not stop. Unusual colored urine (red or dark brown) or unusual colored stools (red or black). Unusual bruising for unknown reasons. A serious fall or if you hit your head (even if there is no bleeding).  Some medicines may interact with Xarelto and might increase your risk of bleeding while on Xarelto. To help avoid this, consult your healthcare provider or pharmacist prior to using any new prescription or non-prescription medications, including herbals, vitamins, non-steroidal anti-inflammatory drugs  (NSAIDs) and supplements.  This website has more information on Xarelto: [email or web address]

## 2020-11-14 NOTE — Plan of Care (Signed)
  Problem: Activity: Goal: Risk for activity intolerance will decrease Outcome: Progressing   Problem: Pain Managment: Goal: General experience of comfort will improve Outcome: Progressing   Problem: Safety: Goal: Ability to remain free from injury will improve Outcome: Progressing   

## 2020-11-14 NOTE — Op Note (Signed)
OPERATIVE REPORT- TOTAL HIP ARTHROPLASTY   PREOPERATIVE DIAGNOSIS: Osteoarthritis of the Right hip.   POSTOPERATIVE DIAGNOSIS: Osteoarthritis of the Right  hip.   PROCEDURE: Right total hip arthroplasty, anterior approach.   SURGEON: Gaynelle Arabian, MD   ASSISTANT: Theresa Duty, PA-C  ANESTHESIA:  Spinal  ESTIMATED BLOOD LOSS:-100 mL    DRAINS: Hemovac x1.   COMPLICATIONS: None   CONDITION: PACU - hemodynamically stable.   BRIEF CLINICAL NOTE: Kathleen Williamson is a 80 y.o. female who has advanced end-  stage arthritis of their Right  hip with progressively worsening pain and  dysfunction.The patient has failed nonoperative management and presents for  total hip arthroplasty.   PROCEDURE IN DETAIL: After successful administration of spinal  anesthetic, the traction boots for the [facility] bed were placed on both  feet and the patient was placed onto the [facility] bed, boots placed into the leg  holders. The Right hip was then isolated from the perineum with plastic  drapes and prepped and draped in the usual sterile fashion. ASIS and  greater trochanter were marked and a oblique incision was made, starting  at about 1 cm lateral and 2 cm distal to the ASIS and coursing towards  the anterior cortex of the femur. The skin was cut with a 10 blade  through subcutaneous tissue to the level of the fascia overlying the  tensor fascia lata muscle. The fascia was then incised in line with the  incision at the junction of the anterior third and posterior 2/3rd. The  muscle was teased off the fascia and then the interval between the TFL  and the rectus was developed. The Hohmann retractor was then placed at  the top of the femoral neck over the capsule. The vessels overlying the  capsule were cauterized and the fat on top of the capsule was removed.  A Hohmann retractor was then placed anterior underneath the rectus  femoris to give exposure to the entire anterior capsule. A T-shaped   capsulotomy was performed. The edges were tagged and the femoral head  was identified.       Osteophytes are removed off the superior acetabulum.  The femoral neck was then cut in situ with an oscillating saw. Traction  was then applied to the left lower extremity utilizing the [facility]  traction. The femoral head was then removed. Retractors were placed  around the acetabulum and then circumferential removal of the labrum was  performed. Osteophytes were also removed. Reaming starts at 45 mm to  medialize and  Increased in 2 mm increments to 47 mm. We reamed in  approximately 40 degrees of abduction, 20 degrees anteversion. A 48 mm  pinnacle acetabular shell was then impacted in anatomic position under  fluoroscopic guidance with excellent purchase. We did not need to place  any additional dome screws. A 28 mm neutral + 4 marathon liner was then  placed into the acetabular shell.       The femoral lift was then placed along the lateral aspect of the femur  just distal to the vastus ridge. The leg was  externally rotated and capsule  was stripped off the inferior aspect of the femoral neck down to the  level of the lesser trochanter, this was done with electrocautery. The femur was lifted after this was performed. The  leg was then placed in an extended and adducted position essentially delivering the femur. We also removed the capsule superiorly and the piriformis from the piriformis  fossa to gain excellent exposure of the  proximal femur. Rongeur was used to remove some cancellous bone to get  into the lateral portion of the proximal femur for placement of the  initial starter reamer. The starter broaches was placed  the starter broach  and was shown to go down the center of the canal. Broaching  with the Actis system was then performed starting at size 0  coursing  Up to size 5. A size 5 had excellent torsional and rotational  and axial stability. The trial high offset neck was then placed   with a 28 + 1.5 trial head. The hip was then reduced. We confirmed that  the stem was in the canal both on AP and lateral x-rays. It also has excellent sizing. The hip was reduced with outstanding stability through full extension and full external rotation.. AP pelvis was taken and the leg lengths were measured and found to be equal. Hip was then dislocated again and the femoral head and neck removed. The  femoral broach was removed. Size 5 Actis stem with a high offset  neck was then impacted into the femur following native anteversion. Has  excellent purchase in the canal. Excellent torsional and rotational and  axial stability. It is confirmed to be in the canal on AP and lateral  fluoroscopic views. The 28 + 1.5 metal head was placed and the hip  reduced with outstanding stability. Again AP pelvis was taken and it  confirmed that the leg lengths were equal. The wound was then copiously  irrigated with saline solution and the capsule reattached and repaired  with Ethibond suture. 30 ml of .25% Bupivicaine was  injected into the capsule and into the edge of the tensor fascia lata as well as subcutaneous tissue. The fascia overlying the tensor fascia lata was then closed with a running #1 V-Loc. Subcu was closed with interrupted 2-0 Vicryl and subcuticular running 4-0 Monocryl. Incision was cleaned  and dried. Steri-Strips and a bulky sterile dressing applied. The patient was awakened and transported to  recovery in stable condition.        Please note that a surgical assistant was a medical necessity for this procedure to perform it in a safe and expeditious manner. Assistant was necessary to provide appropriate retraction of vital neurovascular structures and to prevent femoral fracture and allow for anatomic placement of the prosthesis.  Gaynelle Arabian, M.D.

## 2020-11-14 NOTE — Anesthesia Procedure Notes (Signed)
Spinal  Patient location during procedure: OR Start time: 11/14/2020 11:11 AM End time: 11/14/2020 11:15 AM Reason for block: surgical anesthesia Staffing Performed: anesthesiologist  Anesthesiologist: Oleta Mouse, MD Preanesthetic Checklist Completed: patient identified, IV checked, risks and benefits discussed, surgical consent, monitors and equipment checked, pre-op evaluation and timeout performed Spinal Block Patient position: sitting Prep: DuraPrep Patient monitoring: heart rate, cardiac monitor, continuous pulse ox and blood pressure Approach: midline Location: L3-4 Injection technique: single-shot Needle Needle type: Pencan  Needle gauge: 24 G Needle length: 9 cm Assessment Sensory level: T6 Events: CSF return

## 2020-11-14 NOTE — Plan of Care (Signed)
  Problem: Activity: Goal: Risk for activity intolerance will decrease Outcome: Progressing   Problem: Nutrition: Goal: Adequate nutrition will be maintained Outcome: Progressing   Problem: Coping: Goal: Level of anxiety will decrease Outcome: Progressing   Problem: Pain Managment: Goal: General experience of comfort will improve Outcome: Progressing   

## 2020-11-14 NOTE — Anesthesia Postprocedure Evaluation (Signed)
Anesthesia Post Note  Patient: Kathleen Williamson  Procedure(s) Performed: TOTAL HIP ARTHROPLASTY ANTERIOR APPROACH (Right: Hip)     Patient location during evaluation: PACU Anesthesia Type: MAC and Spinal Level of consciousness: awake and alert Pain management: pain level controlled Vital Signs Assessment: post-procedure vital signs reviewed and stable Respiratory status: spontaneous breathing, nonlabored ventilation, respiratory function stable and patient connected to nasal cannula oxygen Cardiovascular status: blood pressure returned to baseline and stable Postop Assessment: no apparent nausea or vomiting Anesthetic complications: no   No notable events documented.  Last Vitals:  Vitals:   11/14/20 1530 11/14/20 1733  BP: (!) 144/68 (!) 154/82  Pulse: 67 82  Resp: 16 16  Temp: 36.4 C 36.5 C  SpO2: 99% 100%    Last Pain:  Vitals:   11/14/20 1530  TempSrc: Oral  PainSc: 0-No pain                 Trinty Marken

## 2020-11-14 NOTE — Progress Notes (Signed)
Patient admitted to room. Alert and oriented x4. No pain or discomfort. Skin dry and warm to touch without any issue.

## 2020-11-14 NOTE — H&P (Signed)
TOTAL HIP ADMISSION H&P  Patient is admitted for right total hip arthroplasty.  Subjective:  Chief Complaint: Right hip pain  HPI: Kathleen Williamson, 80 y.o. female, has a history of pain and functional disability in the right hip due to arthritis and patient has failed non-surgical conservative treatments for greater than 12 weeks to include NSAID's and/or analgesics and activity modification. Onset of symptoms was gradual, starting  several  years ago with gradually worsening course since that time. The patient noted no past surgery on the right hip. Patient currently rates pain in the right hip at 7 out of 10 with activity. Patient has worsening of pain with activity and weight bearing, pain that interfers with activities of daily living, and pain with passive range of motion. Patient has evidence of periarticular osteophytes and joint space narrowing by imaging studies. This condition presents safety issues increasing the risk of falls. There is no current active infection.  Patient Active Problem List   Diagnosis Date Noted   Complicated UTI (urinary tract infection) 10/31/2020   Pre-op evaluation 10/03/2020   Sensorineural hearing loss, bilateral 07/07/2020   Osteoarthritis of right hip 09/17/2019   Diarrhea 07/26/2019   Recurrent falls 09/15/2017   Tongue lesion 02/12/2017   Osteoarthritis 79/48/0165   Complicated grief 53/74/8270   Health maintenance examination 02/07/2016   Baker's cyst of knee 08/01/2015   Diabetes mellitus type 2, controlled, with complications ([place]) 78/67/5449   Subclavian steal syndrome    Medicare annual wellness visit, subsequent 01/31/2015   Advanced care planning/counseling discussion 01/31/2015   Carotid stenosis 01/31/2015   Vertebral artery stenosis/occlusion 01/31/2015   CKD (chronic kidney disease) stage 3, GFR 30-59 ml/min ([place]) 01/21/2015   Microscopic colitis 10/26/2014   Osteoporosis    Hypothyroidism    Dermatomyositis ([place])    MDD (major  depressive disorder), recurrent severe, without psychosis ([place]) 01/12/2014   Atherosclerosis of native coronary artery of native heart without angina pectoris 12/13/2013   Hyperlipidemia associated with type 2 diabetes mellitus ([place]) 12/13/2013   Pseudocyst of pancreas 08/29/2011    Past Medical History:  Diagnosis Date   Arthritis    fingers   CAD (coronary artery disease) 2003   MI s/p 5v CABG   Carotid stenosis    [place]   CKD (chronic kidney disease) stage 3, GFR 30-59 ml/min ([facility])    Dermatomyositis ([place])    saw Dr Estanislado Pandy, improved on its own   Diabetes mellitus without complication ([place])    Forehead laceration 09/15/2017   History of chicken pox    History of measles    History of migraine none since 2003   HLD (hyperlipidemia)    Hypothyroidism    MDD (major depressive disorder), recurrent episode, moderate ([place])    h/o SI after lost husband unexpectedly 2015   Microscopic colitis 05/2014   by colonoscopy, lymphocytic, zoloft related   Myocardial infarction ([place]) 2003   Osteoporosis    DEXA 03/2013 T -3.1, DEXA 10//2016 -2.9 hip, -2.2 spine   Pancreatic cyst    Dr Ralene Ok, no f/u needed   Prediabetes 2014   Subclavian steal syndrome    Urine incontinence     Past Surgical History:  Procedure Laterality Date   APPENDECTOMY  1997   with hysterectomy   [place]   lower   BREAST LUMPECTOMY Left 1977   benign   COLONOSCOPY WITH PROPOFOL N/A 05/23/2014   microscopic colitis - Garlan Fair, MD   CORONARY ARTERY BYPASS GRAFT  2003   x 5 v   ESOPHAGOGASTRODUODENOSCOPY (EGD) WITH PROPOFOL N/A 05/23/2014   WNL Garlan Fair, MD   ROTATOR CUFF REPAIR Right [phone]   TOOTH EXTRACTION  yrs ago   TOTAL ABDOMINAL HYSTERECTOMY W/ BILATERAL SALPINGOOPHORECTOMY  1997   endometriosis    Prior to Admission medications   Medication Sig Start Date End Date Taking? Authorizing Provider  acetaminophen (TYLENOL) 325 MG tablet Take 650 mg by mouth every 6 (six)  hours as needed for moderate pain or headache.   Yes [provider]  aspirin EC 81 MG tablet Take 81 mg by mouth at bedtime.   Yes [provider]  clonazePAM (KLONOPIN) 0.5 MG tablet Take 1 tablet (0.5 mg total) by mouth daily as needed for anxiety (sedation precautions). Patient taking differently: Take 0.5 mg by mouth 2 (two) times daily. 10/30/20  Yes [name], MD  Coenzyme Q10 (COQ10) 100 MG CAPS Take 100 mg by mouth at bedtime.   Yes [provider]  denosumab (PROLIA) 60 MG/ML SOSY injection Inject 60 mg into the skin every 6 (six) months. 10/03/20  Yes [name], MD  ezetimibe (ZETIA) 10 MG tablet TAKE ONE TABLET BY MOUTH ONCE DAILY Patient taking differently: Take 10 mg by mouth daily. 10/18/20  Yes Jerline Pain, MD  levothyroxine (SYNTHROID) 50 MCG tablet Take 1 tablet (50 mcg total) by mouth daily. 06/18/20  Yes [name], MD  loperamide (IMODIUM A-D) 2 MG tablet Take 2 mg by mouth 4 (four) times daily as needed for diarrhea or loose stools.   Yes [provider]  mirtazapine (REMERON) 15 MG tablet Take 1 tablet (15 mg total) by mouth at bedtime. 10/12/20  Yes [name], MD  nitroGLYCERIN (NITROLINGUAL) 0.4 MG/SPRAY spray Place 1 spray under the tongue every 5 (five) minutes as needed. Patient taking differently: Place 1 spray under the tongue every 5 (five) minutes as needed for chest pain. 05/23/16  Yes Jerline Pain, MD  pravastatin (PRAVACHOL) 80 MG tablet TAKE 1 TABLET BY MOUTH EVERY DAY IN THE EVENING Patient taking differently: Take 80 mg by mouth every evening. 09/19/20  Yes Jerline Pain, MD  venlafaxine XR (EFFEXOR-XR) 150 MG 24 hr capsule Take 1 capsule (150 mg total) by mouth daily with breakfast. 10/12/20  Yes [name], MD  Accu-Chek FastClix Lancets MISC USE AS INSTRUCTED TO CHECK BLOOD SUGAR ONCE DAILY 10/13/19   [name], MD  ACCU-CHEK GUIDE test strip USE AS INSTRUCTED TO CHECK BLOOD SUGAR ONCE  DAILY 10/10/19   [name], MD  Blood Glucose Monitoring Suppl (ACCU-CHEK GUIDE) w/Device KIT 1 each by Does not apply route as directed. Use as instructed to check blood sugar once daily 07/14/19   [name], MD    Allergies  Allergen Reactions   Sulfa Antibiotics Nausea Only   Crestor [Rosuvastatin Calcium] Other (See Comments)    Leg ache    Social History   Socioeconomic History   Marital status: Widowed    Spouse name: Not on file   Number of children: Not on file   Years of education: Not on file   Highest education level: Not on file  Occupational History   Not on file  Tobacco Use   Smoking status: Never   Smokeless tobacco: Never  Vaping Use   Vaping Use: Never used  Substance and Sexual Activity   Alcohol use: No   Drug use: No   Sexual activity: Not Currently  Other Topics Concern  Not on file  Social History Narrative   Lives alone at [facility]   Husband of 38 yrs died suddenly 09-28-13.   No children, no siblings.    Close friends Bethena Roys and Ace Gins nearby, cousin in [place].   Activity: going to [employer]   Financial Resource Strain: Low Risk    Difficulty of Paying Living Expenses: Not very hard  Food Insecurity: No Food Insecurity   Worried About Charity fundraiser in the Last Year: Never true   Ran Out of Food in the Last Year: Never true  Transportation Needs: No Transportation Needs   Lack of Transportation (Medical): No   Lack of Transportation (Non-Medical): No  Physical Activity: Not on file  Stress: Not on file  Social Connections: Not on file  Intimate Partner Violence: Not on file    Tobacco Use: Low Risk    Smoking Tobacco Use: Never   Smokeless Tobacco Use: Never   Social History   Substance and Sexual Activity  Alcohol Use No    Family History  Problem Relation Age of Onset   CAD Father 74   CAD Mother 47   Diabetes Mother    Rheum arthritis Mother    Cancer Maternal Aunt         breast   CAD Other        strong paternal side   Stroke Neg Hx     ROS: Constitutional: no fever, no chills, no night sweats, no significant weight loss Cardiovascular: no chest pain, no palpitations Respiratory: no cough, no shortness of breath, No COPD Gastrointestinal: no vomiting, no nausea Musculoskeletal: no swelling in Joints, Joint Pain Neurologic: no numbness, no tingling, no difficulty with balance    Objective:  Physical Exam: Well nourished and well developed.  General: Alert and oriented x3, cooperative and pleasant, no acute distress.  Head: normocephalic, atraumatic, neck supple.  Eyes: EOMI.  Respiratory: breath sounds clear in all fields, no wheezing, rales, or rhonchi. Cardiovascular: Regular rate and rhythm, no murmurs, gallops or rubs.  Abdomen: non-tender to palpation and soft, normoactive bowel sounds. Musculoskeletal:   The patient has an antalgic gait pattern favoring the right side without the use of assistive devices.     Right Hip Exam:   The range of motion: Flexion to 100 degrees, Internal Rotation is minimal, External Rotation to 20 to 30 degrees, and abduction to 30 degrees without discomfort.   There is no tenderness over the greater trochanteric bursa.     Left Hip Exam:   The range of motion: Normal without discomfort.   There is no tenderness over the greater trochanteric bursa.       The patient's sensation and motor function are intact in their lower extremities. Their distal pulses are 2+. The bilateral calves are soft and non-tender    Vital signs in last 24 hours:    Imaging Review  Radiographs- AP pelvis, AP and lateral of the right hip dated 12/2019 demonstrate bone-on-bone arthritis with subchondral cystic changes and marginal osteophytes. The left hip appears normal.  Assessment/Plan:  End stage arthritis, right hip  The patient history, physical examination, clinical judgement of the provider and imaging studies are  consistent with end stage degenerative joint disease of the right hip and total hip arthroplasty is deemed medically necessary. The treatment options including medical management, injection therapy, arthroscopy and arthroplasty were discussed at length. The risks and benefits of total hip arthroplasty were presented and  reviewed. The risks due to aseptic loosening, infection, stiffness, dislocation/subluxation, thromboembolic complications and other imponderables were discussed. The patient acknowledged the explanation, agreed to proceed with the plan and consent was signed. Patient is being admitted for inpatient treatment for surgery, pain control, PT, OT, prophylactic antibiotics, VTE prophylaxis, progressive ambulation and ADLs and discharge planning.The patient is planning to be discharged  home .   Patient's anticipated LOS is less than 2 midnights, meeting these requirements: - Lives within 1 hour of care - Has a competent adult at home to recover with post-op  - NO history of  - Chronic pain requiring opioids  - Diabetes  - Coronary Artery Disease  - Heart failure  - Heart attack  - Stroke  - DVT/VTE  - Cardiac arrhythmia  - Respiratory Failure/COPD  - Renal failure  - Anemia  - Advanced Liver disease    Therapy Plans: HEP Disposition: Home to [facility]  Planned DVT Prophylaxis: Xarelto 53m DME Needed: RW and 3-in-1 PCP: Dr. JRia Bush Cardiologist: Dr. MCandee Furbish TXA: IV Allergies: N/A Anesthesia Concerns: None BMI: 24.5 Last HgbA1c:   [facility] [address]  - Patient was instructed on what medications to stop prior to surgery. - Follow-up visit in 2 weeks with Dr. AWynelle Link- Begin physical therapy following surgery - Pre-operative lab work as pre-surgical testing - Prescriptions will be provided in hospital at time of discharge  SFenton Foy [facility] PA-C Orthopedic Surgery [facility] [place]

## 2020-11-14 NOTE — Transfer of Care (Addendum)
Immediate Anesthesia Transfer of Care Note  Patient: Kathleen Williamson  Procedure(s) Performed: TOTAL HIP ARTHROPLASTY ANTERIOR APPROACH (Right: Hip)  Patient Location: PACU  Anesthesia Type:Spinal  Level of Consciousness: sedated  Airway & Oxygen Therapy: Patient Spontanous Breathing and Patient connected to face mask oxygen  Post-op Assessment: Report given to RN and Post -op Vital signs reviewed and stable  Post vital signs: Reviewed and stable  Last Vitals:  Vitals Value Taken Time  BP 132/56   Temp    Pulse 52 11/14/20 1244  Resp 13 11/14/20 1244  SpO2 100 % 11/14/20 1244  Vitals shown include unvalidated device data.  Last Pain:  Vitals:   11/14/20 1018  TempSrc:   PainSc: 0-No pain      Patients Stated Pain Goal: 4 ([phone] [id number])  Complications: No notable events documented.

## 2020-11-14 NOTE — Evaluation (Signed)
Physical Therapy Evaluation Patient Details Name: Kathleen Williamson MRN: [id number] DOB: March 03, 1941 Today's Date: 11/14/2020   History of Present Illness  Pt is 80 yo female s/p R anterior THA on 11/14/20.  SHe has history including OA, DM, DKD, OP, MDD, back surgery, and CABG x 5  Clinical Impression  Pt is s/p THA resulting in the deficits listed below (see PT Problem List). At baseline, pt is modified independent with use of a cane.  She does live in [place] alone, reports plan is to return to the SNF at [facility].  Today, pt was limited due to feeling weak/shaky with ambulation; All VSS.  She did ambulate 5' with min A of 2 for safety. Pt expected to progress well, but may need SNFdue to lack of support at home.  Pt will benefit from skilled PT to increase their independence and safety with mobility to allow discharge to the venue listed below.      Follow Up Recommendations Follow surgeon's recommendation for DC plan and follow-up therapies (Pt reports from ILF at [facility] and plan is to return to their SNF)    Equipment Recommendations  None recommended by PT    Recommendations for Other Services       Precautions / Restrictions Precautions Precautions: Fall Restrictions Weight Bearing Restrictions: Yes RLE Weight Bearing: Weight bearing as tolerated      Mobility  Bed Mobility Overal bed mobility: Needs Assistance Bed Mobility: Supine to Sit     Supine to sit: Mod assist     General bed mobility comments: Assist to scoot buttock using bed pad, move R LE, and lift trunk    Transfers Overall transfer level: Needs assistance Equipment used: Rolling walker (2 wheeled) Transfers: Sit to/from Stand Sit to Stand: Min assist         General transfer comment: Cues for safe hand placement and min A to rise  Ambulation/Gait Ambulation/Gait assistance: Min assist;+2 safety/equipment Gait Distance (Feet): 5 Feet Assistive device: Rolling walker (2 wheeled) Gait  Pattern/deviations: Step-to pattern;Decreased weight shift to right;Decreased stride length;Shuffle Gait velocity: decreased   General Gait Details: Min A to steady with cues for sequencing and RW use; pt reports feeling shakey limiting distance  Stairs            Wheelchair Mobility    Modified Rankin (Stroke Patients Only)       Balance Overall balance assessment: Needs assistance Sitting-balance support: No upper extremity supported Sitting balance-Leahy Scale: Good     Standing balance support: Bilateral upper extremity supported Standing balance-Leahy Scale: Poor Standing balance comment: requiring RW and min A at times                             Pertinent Vitals/Pain Pain Assessment: No/denies pain    Home Living Family/patient expects to be discharged to:: Skilled nursing facility (Lives ILF at [facility] but plans to go back to their SNF) Living Arrangements: Alone Available Help at Discharge: [place] Type of Home: Independent living facility [facility]) Home Access: Level entry     Home Layout: One level Home Equipment: Environmental consultant - 2 wheels;Cane - single point      Prior Function           Comments: Pt independent with ADLs, IADLs, driving, community ambulation.  She was using a cane     Hand Dominance        Extremity/Trunk Assessment  Upper Extremity Assessment Upper Extremity Assessment: Overall WFL for tasks assessed    Lower Extremity Assessment Lower Extremity Assessment: LLE deficits/detail;RLE deficits/detail RLE Deficits / Details: ROM WFL; MMT ankle 5/5, knee 3/5, hip 1/5; expected post op changes RLE Sensation: decreased light touch (bil feet still with tingling) LLE Deficits / Details: ROM WFL: MMT 5/5 LLE Sensation: decreased light touch (bil feet still with tingling)    Cervical / Trunk Assessment Cervical / Trunk Assessment: Normal  Communication   Communication: No difficulties  Cognition  Arousal/Alertness: Awake/alert Behavior During Therapy: WFL for tasks assessed/performed Overall Cognitive Status: Within Functional Limits for tasks assessed                                        General Comments General comments (skin integrity, edema, etc.): VSS on RA    Exercises Total Joint Exercises Ankle Circles/Pumps: AROM;Both;5 reps;Seated Quad Sets: AROM;Both;5 reps;Seated   Assessment/Plan    PT Assessment Patient needs continued PT services  PT Problem List Decreased strength;Decreased mobility;Decreased safety awareness;Decreased range of motion;Decreased activity tolerance;Decreased balance;Decreased knowledge of use of DME;Pain       PT Treatment Interventions DME instruction;Therapeutic activities;Gait training;Therapeutic exercise;Patient/family education;Modalities;Stair training;Balance training;Functional mobility training    PT Goals (Current goals can be found in the Care Plan section)  Acute Rehab PT Goals Patient Stated Goal: walk; return to [facility] SNF at dc PT Goal Formulation: With patient Time For Goal Achievement: 11/28/20 Potential to Achieve Goals: Good    Frequency 7X/week   Barriers to discharge Decreased caregiver support      Co-evaluation               AM-PAC PT "6 Clicks" Mobility  Outcome Measure Help needed turning from your back to your side while in a flat bed without using bedrails?: A Little Help needed moving from lying on your back to sitting on the side of a flat bed without using bedrails?: A Lot Help needed moving to and from a bed to a chair (including a wheelchair)?: A Little Help needed standing up from a chair using your arms (e.g., wheelchair or bedside chair)?: A Little Help needed to walk in hospital room?: A Lot Help needed climbing 3-5 steps with a railing? : A Lot 6 Click Score: 15    End of Session Equipment Utilized During Treatment: Gait belt Activity Tolerance: Patient limited by  fatigue Patient left: with chair alarm set;in chair;with call bell/phone within reach;with family/visitor present Nurse Communication: Mobility status PT Visit Diagnosis: Other abnormalities of gait and mobility (R26.89);Muscle weakness (generalized) (M62.81)    Time: [id number] PT Time Calculation (min) (ACUTE ONLY): 18 min   Charges:   PT Evaluation $PT Eval Low Complexity: 1 Low          Shelley Pooley, PT Acute Rehab Services Pager [phone] [facility] [phone]   Karlton Lemon 11/14/2020, 4:47 PM

## 2020-11-14 NOTE — Interval H&P Note (Signed)
History and Physical Interval Note:  11/14/2020 9:58 AM  Kathleen Williamson  has presented today for surgery, with the diagnosis of Right hip osteoarthritis.  The various methods of treatment have been discussed with the patient and family. After consideration of risks, benefits and other options for treatment, the patient has consented to  Procedure(s): TOTAL HIP ARTHROPLASTY ANTERIOR APPROACH (Right) as a surgical intervention.  The patient's history has been reviewed, patient examined, no change in status, stable for surgery.  I have reviewed the patient's chart and labs.  Questions were answered to the patient's satisfaction.     Pilar Plate Varina Hulon

## 2020-11-15 DIAGNOSIS — M1611 Unilateral primary osteoarthritis, right hip: Secondary | ICD-10-CM | POA: Diagnosis not present

## 2020-11-15 LAB — CBC
HCT: 33.8 % — ABNORMAL LOW (ref 36.0–46.0)
Hemoglobin: 10.6 g/dL — ABNORMAL LOW (ref 12.0–15.0)
MCH: 27.2 pg (ref 26.0–34.0)
MCHC: 31.4 g/dL (ref 30.0–36.0)
MCV: 86.9 fL (ref 80.0–100.0)
Platelets: 253 10*3/uL (ref 150–400)
RBC: 3.89 MIL/uL (ref 3.87–5.11)
RDW: 13.8 % (ref 11.5–15.5)
WBC: 9.8 10*3/uL (ref 4.0–10.5)
nRBC: 0 % (ref 0.0–0.2)

## 2020-11-15 LAB — GLUCOSE, CAPILLARY
Glucose-Capillary: 152 mg/dL — ABNORMAL HIGH (ref 70–99)
Glucose-Capillary: 141 mg/dL — ABNORMAL HIGH (ref 70–99)
Glucose-Capillary: 127 mg/dL — ABNORMAL HIGH (ref 70–99)
Glucose-Capillary: 145 mg/dL — ABNORMAL HIGH (ref 70–99)

## 2020-11-15 LAB — BASIC METABOLIC PANEL
Anion gap: 8 (ref 5–15)
BUN: 14 mg/dL (ref 8–23)
CO2: 25 mmol/L (ref 22–32)
Calcium: 8.6 mg/dL — ABNORMAL LOW (ref 8.9–10.3)
Chloride: 109 mmol/L (ref 98–111)
Creatinine, Ser: 1.18 mg/dL — ABNORMAL HIGH (ref 0.44–1.00)
GFR, Estimated: 47 mL/min — ABNORMAL LOW (ref >60)
Glucose, Bld: 177 mg/dL — ABNORMAL HIGH (ref 70–99)
Potassium: 3.9 mmol/L (ref 3.5–5.1)
Sodium: 142 mmol/L (ref 135–145)

## 2020-11-15 NOTE — Progress Notes (Signed)
Physical Therapy Treatment Patient Details Name: Kathleen Williamson MRN: [id number] DOB: 01/01/1941 Today's Date: 11/15/2020    History of Present Illness Pt is 80 yo female s/p R anterior THA on 11/14/20.  SHe has history including OA, DM, DKD, OP, MDD, back surgery, and CABG x 5    PT Comments    Pt continues very cooperative and progressing slowly but steadily with mobility.  Pt up to bathroom to attempt toileting and ambulated limited distance into hall with RW, min assist and step-by-step cues for sequencing and safety.   Follow Up Recommendations  Follow surgeon's recommendation for DC plan and follow-up therapies;SNF     Equipment Recommendations  None recommended by PT    Recommendations for Other Services       Precautions / Restrictions Precautions Precautions: Fall Restrictions Weight Bearing Restrictions: No RLE Weight Bearing: Weight bearing as tolerated    Mobility  Bed Mobility Overal bed mobility: Needs Assistance Bed Mobility: Supine to Sit     Supine to sit: Mod assist     General bed mobility comments: Assist to scoot buttock using bed pad, move R LE, and lift trunk    Transfers Overall transfer level: Needs assistance Equipment used: Rolling walker (2 wheeled) Transfers: Sit to/from Stand Sit to Stand: Min assist         General transfer comment: Cues for safe hand placement and min A to rise  Ambulation/Gait Ambulation/Gait assistance: Min assist Gait Distance (Feet): 34 Feet (and additional 15' into bathroom) Assistive device: Rolling walker (2 wheeled) Gait Pattern/deviations: Step-to pattern;Decreased weight shift to right;Decreased stride length;Shuffle Gait velocity: decreased   General Gait Details: Min A to steady with cues for sequencing and RW use;   Stairs             Wheelchair Mobility    Modified Rankin (Stroke Patients Only)       Balance Overall balance assessment: Needs assistance Sitting-balance support: No  upper extremity supported Sitting balance-Leahy Scale: Good     Standing balance support: Bilateral upper extremity supported Standing balance-Leahy Scale: Poor Standing balance comment: requiring RW and min A at times                            Cognition Arousal/Alertness: Awake/alert Behavior During Therapy: WFL for tasks assessed/performed Overall Cognitive Status: Within Functional Limits for tasks assessed                                        Exercises      General Comments        Pertinent Vitals/Pain Pain Assessment: 0-10 Pain Score: 5  Pain Location: R hip/thigh Pain Descriptors / Indicators: Aching;Burning;Sore Pain Intervention(s): Limited activity within patient's tolerance;Monitored during session;Premedicated before session;Ice applied    Home Living                      Prior Function            PT Goals (current goals can now be found in the care plan section) Acute Rehab PT Goals Patient Stated Goal: walk; return to [facility] at dc PT Goal Formulation: With patient Time For Goal Achievement: 11/28/20 Potential to Achieve Goals: Good Progress towards PT goals: Progressing toward goals    Frequency    7X/week      PT  Plan Discharge plan needs to be updated    Co-evaluation              AM-PAC PT "6 Clicks" Mobility   Outcome Measure  Help needed turning from your back to your side while in a flat bed without using bedrails?: A Little Help needed moving from lying on your back to sitting on the side of a flat bed without using bedrails?: A Lot Help needed moving to and from a bed to a chair (including a wheelchair)?: A Little Help needed standing up from a chair using your arms (e.g., wheelchair or bedside chair)?: A Little Help needed to walk in hospital room?: A Little Help needed climbing 3-5 steps with a railing? : A Lot 6 Click Score: 16    End of Session Equipment Utilized During  Treatment: Gait belt Activity Tolerance: Patient limited by fatigue;Patient tolerated treatment well Patient left: with chair alarm set;in chair;with call bell/phone within reach;with family/visitor present Nurse Communication: Mobility status PT Visit Diagnosis: Other abnormalities of gait and mobility (R26.89);Muscle weakness (generalized) (M62.81)     Time: [id number] PT Time Calculation (min) (ACUTE ONLY): 24 min  Charges:  $Gait Training: 8-22 mins $Therapeutic Activity: 8-22 mins                     Debe Coder PT Acute Rehabilitation Services Pager [phone] Office [phone]    Alejandria Wessells 11/15/2020, 3:04 PM

## 2020-11-15 NOTE — Plan of Care (Signed)
  Problem: Health Behavior/Discharge Planning: Goal: Ability to manage health-related needs will improve Outcome: Progressing   Problem: Clinical Measurements: Goal: Ability to maintain clinical measurements within normal limits will improve Outcome: Progressing Goal: Will remain free from infection Outcome: Progressing Goal: Diagnostic test results will improve Outcome: Progressing   Problem: Activity: Goal: Risk for activity intolerance will decrease Outcome: Progressing   Problem: Coping: Goal: Level of anxiety will decrease Outcome: Progressing   Problem: Elimination: Goal: Will not experience complications related to bowel motility Outcome: Progressing Goal: Will not experience complications related to urinary retention Outcome: Progressing   Problem: Pain Managment: Goal: General experience of comfort will improve Outcome: Progressing   Problem: Safety: Goal: Ability to remain free from injury will improve Outcome: Progressing   Problem: Skin Integrity: Goal: Risk for impaired skin integrity will decrease Outcome: Progressing   Problem: Education: Goal: Knowledge of the prescribed therapeutic regimen will improve Outcome: Progressing Goal: Understanding of discharge needs will improve Outcome: Progressing   Problem: Activity: Goal: Ability to avoid complications of mobility impairment will improve Outcome: Progressing Goal: Ability to tolerate increased activity will improve Outcome: Progressing   Problem: Clinical Measurements: Goal: Postoperative complications will be avoided or minimized Outcome: Progressing   Problem: Pain Management: Goal: Pain level will decrease with appropriate interventions Outcome: Progressing   Problem: Skin Integrity: Goal: Will show signs of wound healing Outcome: Progressing

## 2020-11-15 NOTE — NC FL2 (Signed)
[place] LEVEL OF CARE SCREENING TOOL     IDENTIFICATION  Patient Name: Kathleen Williamson Birthdate: June 05, 1940 Sex: female Admission Date (Current Location): 11/14/2020  [facility] and [place] Number:  Herbalist and Address:  [facility],  [place], [place]      Provider Number: [id number]  Attending Physician Name and Address:  Gaynelle Arabian, MD  Relative Name and Phone Number:  Serina Cowper (relative) Ph: [phone]    Current Level of Care: Hospital Recommended Level of Care: [place] Prior Approval Number:    Date Approved/Denied:   PASRR Number: Pending  Discharge Plan: SNF    Current Diagnoses: Patient Active Problem List   Diagnosis Date Noted   Primary osteoarthritis of right hip 123XX123   Complicated UTI (urinary tract infection) 10/31/2020   Pre-op evaluation 10/03/2020   Sensorineural hearing loss, bilateral 07/07/2020   Osteoarthritis of right hip 09/17/2019   Diarrhea 07/26/2019   Recurrent falls 09/15/2017   Tongue lesion 02/12/2017   Osteoarthritis [id number]   Complicated grief 0000000   Health maintenance examination 02/07/2016   Baker's cyst of knee 08/01/2015   Diabetes mellitus type 2, controlled, with complications ([place]) 123XX123   Subclavian steal syndrome    Medicare annual wellness visit, subsequent 01/31/2015   Advanced care planning/counseling discussion 01/31/2015   Carotid stenosis 01/31/2015   Vertebral artery stenosis/occlusion 01/31/2015   CKD (chronic kidney disease) stage 3, GFR 30-59 ml/min ([place]) 01/21/2015   Microscopic colitis 10/26/2014   Osteoporosis    Hypothyroidism    Dermatomyositis ([place])    MDD (major depressive disorder), recurrent severe, without psychosis ([place]) 01/12/2014   Atherosclerosis of native coronary artery of native heart without angina pectoris 12/13/2013   Hyperlipidemia associated with type 2 diabetes mellitus ([place]) 12/13/2013    Pseudocyst of pancreas 08/29/2011    Orientation RESPIRATION BLADDER Height & Weight     Self, Time, Situation, Place  Normal Continent Weight: 132 lb (59.9 kg) Height:  '5\' 3"'$  (160 cm)  BEHAVIORAL SYMPTOMS/MOOD NEUROLOGICAL BOWEL NUTRITION STATUS      Continent Diet (Carb modified)  AMBULATORY STATUS COMMUNICATION OF NEEDS Skin   Limited Assist Verbally Surgical wounds                       Personal Care Assistance Level of Assistance  Bathing, Feeding, Dressing Bathing Assistance: Limited assistance Feeding assistance: Independent Dressing Assistance: Limited assistance     Functional Limitations Info  Sight, Hearing, Speech Sight Info: Impaired Hearing Info: Adequate Speech Info: Adequate    SPECIAL CARE FACTORS FREQUENCY  PT (By licensed PT), OT (By licensed OT)     PT Frequency: 5x's/week OT Frequency: 5x's/week            Contractures Contractures Info: Not present    Additional Factors Info  Code Status, Allergies, Insulin Sliding Scale, Psychotropic Code Status Info: Full Allergies Info: Sulfa Antibiotics, Crestor (Rosuvastatin Calcium) Psychotropic Info: Remeron, Klonopin Insulin Sliding Scale Info: See discharge summary       Current Medications (11/15/2020):  This is the current hospital active medication list Current Facility-Administered Medications  Medication Dose Route Frequency Provider Last Rate Last Admin   0.9 %  sodium chloride infusion   Intravenous Continuous Edmisten, Kristie L, PA 75 mL/hr at 11/15/20 0533 New Bag at 11/15/20 0533   acetaminophen (TYLENOL) tablet 325-650 mg  325-650 mg Oral Q6H PRN Edmisten, Kristie L, PA       bisacodyl (  DULCOLAX) suppository 10 mg  10 mg Rectal Daily PRN Edmisten, Kristie L, PA       clonazePAM (KLONOPIN) tablet 0.5 mg  0.5 mg Oral BID Edmisten, Kristie L, PA   0.5 mg at 11/15/20 0944   docusate sodium (COLACE) capsule 100 mg  100 mg Oral BID Edmisten, Kristie L, PA   100 mg at 11/15/20 0944    ezetimibe (ZETIA) tablet 10 mg  10 mg Oral Daily Edmisten, Kristie L, PA   10 mg at 11/15/20 0944   HYDROcodone-acetaminophen (NORCO) 7.5-325 MG per tablet 1-2 tablet  1-2 tablet Oral Q4H PRN Theresa Duty L, PA   2 tablet at 11/14/20 1931   HYDROcodone-acetaminophen (NORCO/VICODIN) 5-325 MG per tablet 1-2 tablet  1-2 tablet Oral Q4H PRN Edmisten, Kristie L, PA   2 tablet at 11/15/20 0945   insulin aspart (novoLOG) injection 0-15 Units  0-15 Units Subcutaneous TID WC Edmisten, Kristie L, PA   3 Units at 11/15/20 0944   insulin aspart (novoLOG) injection 0-5 Units  0-5 Units Subcutaneous QHS Edmisten, Kristie L, PA   2 Units at 11/14/20 2230   levothyroxine (SYNTHROID) tablet 50 mcg  50 mcg Oral Q0600 Edmisten, Kristie L, PA   50 mcg at 11/15/20 0530   menthol-cetylpyridinium (CEPACOL) lozenge 3 mg  1 lozenge Oral PRN Edmisten, Kristie L, PA       Or   phenol (CHLORASEPTIC) mouth spray 1 spray  1 spray Mouth/Throat PRN Edmisten, Kristie L, PA       methocarbamol (ROBAXIN) tablet 500 mg  500 mg Oral Q6H PRN Edmisten, Kristie L, PA   500 mg at 11/15/20 0530   Or   methocarbamol (ROBAXIN) 500 mg in dextrose 5 % 50 mL IVPB  500 mg Intravenous Q6H PRN Edmisten, Kristie L, PA       metoCLOPramide (REGLAN) tablet 5-10 mg  5-10 mg Oral Q8H PRN Edmisten, Kristie L, PA       Or   metoCLOPramide (REGLAN) injection 5-10 mg  5-10 mg Intravenous Q8H PRN Edmisten, Kristie L, PA       mirtazapine (REMERON) tablet 15 mg  15 mg Oral QHS Edmisten, Kristie L, PA   15 mg at 11/14/20 2241   morphine 2 MG/ML injection 0.5-1 mg  0.5-1 mg Intravenous Q2H PRN Edmisten, Kristie L, PA       nitroGLYCERIN (NITROLINGUAL) 0.4 MG/SPRAY spray 1 spray  1 spray Sublingual Q5 min PRN Edmisten, Kristie L, PA       ondansetron (ZOFRAN) tablet 4 mg  4 mg Oral Q6H PRN Edmisten, Kristie L, PA       Or   ondansetron (ZOFRAN) injection 4 mg  4 mg Intravenous Q6H PRN Edmisten, Kristie L, PA       polyethylene glycol (MIRALAX /  GLYCOLAX) packet 17 g  17 g Oral Daily PRN Edmisten, Kristie L, PA       pravastatin (PRAVACHOL) tablet 80 mg  80 mg Oral QPM Edmisten, Kristie L, PA       rivaroxaban (XARELTO) tablet 10 mg  10 mg Oral Q breakfast Edmisten, Kristie L, PA   10 mg at 11/15/20 0944   venlafaxine XR (EFFEXOR-XR) 24 hr capsule 150 mg  150 mg Oral Q breakfast Edmisten, Kristie L, PA   150 mg at 11/15/20 [id number]     Discharge Medications: Please see discharge summary for a list of discharge medications.  Relevant Imaging Results:  Relevant Lab Results:   Additional Information SSN: [id number]  [facility]  Roda Shutters, LCSW

## 2020-11-15 NOTE — Progress Notes (Signed)
Physical Therapy Treatment Patient Details Name: Kathleen Williamson MRN: [id number] DOB: 1940/10/18 Today's Date: 11/15/2020    History of Present Illness Pt is 80 yo female s/p R anterior THA on 11/14/20.  SHe has history including OA, DM, DKD, OP, MDD, back surgery, and CABG x 5    PT Comments    Pt very cooperative but with increased anxiety.  Pt performed therex program with assist and up to ambulate increased, but still limited, distance into hallway.   Pt plans dc to [facility] re[facility] prior to returning to [facility] living apartment.  This is appropriate based on pt's current assist level, anxiety and need for frequent cues for safe performance of tasks.  Follow Up Recommendations  Follow surgeon's recommendation for DC plan and follow-up therapies     Equipment Recommendations  None recommended by PT    Recommendations for Other Services       Precautions / Restrictions Precautions Precautions: Fall Restrictions Weight Bearing Restrictions: No RLE Weight Bearing: Weight bearing as tolerated    Mobility  Bed Mobility Overal bed mobility: Needs Assistance Bed Mobility: Supine to Sit     Supine to sit: Mod assist     General bed mobility comments: Assist to scoot buttock using bed pad, move R LE, and lift trunk    Transfers Overall transfer level: Needs assistance Equipment used: Rolling walker (2 wheeled) Transfers: Sit to/from Stand Sit to Stand: Min assist         General transfer comment: Cues for safe hand placement and min A to rise  Ambulation/Gait Ambulation/Gait assistance: Min assist Gait Distance (Feet): 20 Feet Assistive device: Rolling walker (2 wheeled) Gait Pattern/deviations: Step-to pattern;Decreased weight shift to right;Decreased stride length;Shuffle Gait velocity: decreased   General Gait Details: Min A to steady with cues for sequencing and RW use;   Stairs             Wheelchair Mobility    Modified Rankin (Stroke Patients Only)        Balance Overall balance assessment: Needs assistance Sitting-balance support: No upper extremity supported Sitting balance-Leahy Scale: Good     Standing balance support: Bilateral upper extremity supported Standing balance-Leahy Scale: Poor Standing balance comment: requiring RW and min A at times                            Cognition Arousal/Alertness: Awake/alert Behavior During Therapy: WFL for tasks assessed/performed Overall Cognitive Status: Within Functional Limits for tasks assessed                                        Exercises Total Joint Exercises Ankle Circles/Pumps: AROM;Both;15 reps;Supine Quad Sets: AROM;Both;10 reps;Supine Heel Slides: AAROM;Right;20 reps;Supine Hip ABduction/ADduction: AAROM;Right;15 reps;Supine    General Comments        Pertinent Vitals/Pain Pain Assessment: 0-10 Pain Score: 6  Pain Location: R hip/thigh Pain Descriptors / Indicators: Aching;Burning;Sore Pain Intervention(s): Monitored during session;Limited activity within patient's tolerance;Premedicated before session;Ice applied    Home Living                      Prior Function            PT Goals (current goals can now be found in the care plan section) Acute Rehab PT Goals Patient Stated Goal: walk; return to [facility] at  dc PT Goal Formulation: With patient Time For Goal Achievement: 11/28/20 Potential to Achieve Goals: Good Progress towards PT goals: Progressing toward goals    Frequency    7X/week      PT Plan Current plan remains appropriate    Co-evaluation              AM-PAC PT "6 Clicks" Mobility   Outcome Measure  Help needed turning from your back to your side while in a flat bed without using bedrails?: A Little Help needed moving from lying on your back to sitting on the side of a flat bed without using bedrails?: A Lot Help needed moving to and from a bed to a chair (including a  wheelchair)?: A Little Help needed standing up from a chair using your arms (e.g., wheelchair or bedside chair)?: A Little Help needed to walk in hospital room?: A Little Help needed climbing 3-5 steps with a railing? : A Lot 6 Click Score: 16    End of Session Equipment Utilized During Treatment: Gait belt Activity Tolerance: Patient limited by fatigue;Patient tolerated treatment well Patient left: with chair alarm set;in chair;with call bell/phone within reach Nurse Communication: Mobility status PT Visit Diagnosis: Other abnormalities of gait and mobility (R26.89);Muscle weakness (generalized) (M62.81)     Time: [id number] PT Time Calculation (min) (ACUTE ONLY): 33 min  Charges:  $Gait Training: 8-22 mins $Therapeutic Exercise: 8-22 mins                     Debe Coder PT Acute Rehabilitation Services Pager [phone] Office [phone]    Slate Debroux 11/15/2020, 10:36 AM

## 2020-11-15 NOTE — TOC Initial Note (Signed)
Transition of Care [facility]) - Initial/Assessment Note   Patient Details  Name: Kathleen Williamson MRN: [id number] Date of Birth: 03/29/1941  [facility] CM/SW Contact:    Sherie Don, LCSW Phone Number: 11/15/2020, 11:19 AM  Clinical Narrative: Patient is a 80 year old female who lives in independent living at [facility].  CSW met with patient and PT. Per patient, the plan is for her to discharge to [facility] for rehab and then transition back to independent living.  FL2 done; PASRR pending as additional documentation was requested and cannot be uploaded without a physician cosign ([place], 30 nursing home stay note).  CSW confirmed with Seth Bake at [facility] that patient can come for rehab pending insurance authorization, PASRR, and negative COVID test.  CSW called [facility] and spoke with Tammy to start insurance authorization for SNF and EMS transport.  TOC awaiting insurance authorization and signed documents so that PASRR can be completed.  Expected Discharge Plan: Skilled Nursing Facility Barriers to Discharge: Insurance Authorization  Patient Goals and CMS Choice Patient states their goals for this hospitalization and ongoing recovery are:: Go to [facility] for rehab [employer]gov Compare Post Acute Care list provided to:: Patient Choice offered to / list presented to : Patient  Expected Discharge Plan and Services Expected Discharge Plan: [place] In-house Referral: Clinical Social Work Post Acute Care Choice: [place] Living arrangements for the past 2 months: [place]             DME Arranged: 3-N-1, Walker rolling DME Agency: Economist spoke with at DME Agency: Pre-arranged in orthopedist's office HH Arranged: NA [place] Agency: NA  Prior Living Arrangements/Services Living arrangements for the past 2 months: [place] Lives with:: Self Patient language and need for interpreter  reviewed:: Yes Do you feel safe going back to the place where you live?: Yes      Need for Family Participation in Patient Care: No (Comment) Care giver support system in place?: Yes (comment) Criminal Activity/Legal Involvement Pertinent to Current Situation/Hospitalization: No - Comment as needed  Activities of Daily Living Home Assistive Devices/Equipment: Dentures (specify type), Eyeglasses, Cane (specify quad or straight), Walker (specify type), Bedside commode/3-in-1, Grab bars in shower, Shower chair with back, CBG Meter ADL Screening (condition at time of admission) Patient's cognitive ability adequate to safely complete daily activities?: Yes Is the patient deaf or have difficulty hearing?: No Does the patient have difficulty seeing, even when wearing glasses/contacts?: No Does the patient have difficulty concentrating, remembering, or making decisions?: No Patient able to express need for assistance with ADLs?: Yes Does the patient have difficulty dressing or bathing?: No Independently performs ADLs?: Yes (appropriate for developmental age) Does the patient have difficulty walking or climbing stairs?: No Weakness of Legs: None Weakness of Arms/Hands: None  Permission Sought/Granted Permission sought to share information with : Facility Art therapist granted to share information with : Yes, Verbal Permission Granted Permission granted to share info w AGENCY: [facility]  Emotional Assessment Appearance:: Appears stated age Attitude/Demeanor/Rapport: Engaged Affect (typically observed): Accepting Orientation: : Oriented to Self, Oriented to Place, Oriented to  Time, Oriented to Situation Alcohol / Substance Use: Not Applicable Psych Involvement: No (comment)  Admission diagnosis:  Primary osteoarthritis of right hip [M16.11] Patient Active Problem List   Diagnosis Date Noted   Primary osteoarthritis of right hip 50/35/4656   Complicated UTI (urinary  tract infection) 10/31/2020   Pre-op evaluation 10/03/2020  Sensorineural hearing loss, bilateral 07/07/2020   Osteoarthritis of right hip 09/17/2019   Diarrhea 07/26/2019   Recurrent falls 09/15/2017   Tongue lesion 02/12/2017   Osteoarthritis 16/01/9603   Complicated grief 54/12/8117   Health maintenance examination 02/07/2016   Baker's cyst of knee 08/01/2015   Diabetes mellitus type 2, controlled, with complications ([place]) 14/78/2956   Subclavian steal syndrome    Medicare annual wellness visit, subsequent 01/31/2015   Advanced care planning/counseling discussion 01/31/2015   Carotid stenosis 01/31/2015   Vertebral artery stenosis/occlusion 01/31/2015   CKD (chronic kidney disease) stage 3, GFR 30-59 ml/min ([place]) 01/21/2015   Microscopic colitis 10/26/2014   Osteoporosis    Hypothyroidism    Dermatomyositis ([place])    MDD (major depressive disorder), recurrent severe, without psychosis ([place]) 01/12/2014   Atherosclerosis of native coronary artery of native heart without angina pectoris 12/13/2013   Hyperlipidemia associated with type 2 diabetes mellitus ([place]) 12/13/2013   Pseudocyst of pancreas 08/29/2011   PCP:  [name], MD Pharmacy:   [facility] - [place], [place] - [address] [place] [address] Phone: [phone] Fax: [phone]  [id number] [address] [place]Phone: [phone]: [phone]- [place]Phone: [phone]: [phone] Readmission Risk Interventions No flowsheet data found.

## 2020-11-15 NOTE — Care Plan (Signed)
Ortho Bundle Case Management Note  Patient Details  Name: Kathleen Williamson MRN: [id number] Date of Birth: [birth year]  R THA on 11-14-20 DCP:  Return home to apt at [facility].  Has an elevator. DME:  RW and 3-in-1 ordered through [place] PT:  HEP                   DME Arranged:  Walker rolling, 3-N-1 DME Agency:  [employer]  HH Arranged:  NA [place] Agency:  NA  Additional Comments: Please contact me with any questions of if this plan should need to change.  Marianne Sofia, RN,CCM [facility]  [phone] 11/15/2020, 8:11 AM

## 2020-11-15 NOTE — Care Management (Signed)
RE: Kathleen Williamson Date of Birth: 12/03/1940 Date: 11/15/2020 MUST ID: [id number]   To Whom It May Concern:   Please be advised that the above name patient will require a short-term nursing home stay--anticipated 30 days or less rehabilitation and strengthening. The plan is for return home.

## 2020-11-16 DIAGNOSIS — E0800 Diabetes mellitus due to underlying condition with hyperosmolarity without nonketotic hyperglycemic-hyperosmolar coma (NKHHC): Secondary | ICD-10-CM | POA: Diagnosis not present

## 2020-11-16 DIAGNOSIS — R2681 Unsteadiness on feet: Secondary | ICD-10-CM | POA: Diagnosis not present

## 2020-11-16 DIAGNOSIS — E785 Hyperlipidemia, unspecified: Secondary | ICD-10-CM | POA: Diagnosis not present

## 2020-11-16 DIAGNOSIS — Z471 Aftercare following joint replacement surgery: Secondary | ICD-10-CM | POA: Diagnosis not present

## 2020-11-16 DIAGNOSIS — F329 Major depressive disorder, single episode, unspecified: Secondary | ICD-10-CM | POA: Diagnosis not present

## 2020-11-16 DIAGNOSIS — F419 Anxiety disorder, unspecified: Secondary | ICD-10-CM | POA: Diagnosis not present

## 2020-11-16 DIAGNOSIS — N183 Chronic kidney disease, stage 3 unspecified: Secondary | ICD-10-CM | POA: Diagnosis not present

## 2020-11-16 DIAGNOSIS — I252 Old myocardial infarction: Secondary | ICD-10-CM | POA: Diagnosis not present

## 2020-11-16 DIAGNOSIS — R278 Other lack of coordination: Secondary | ICD-10-CM | POA: Diagnosis not present

## 2020-11-16 DIAGNOSIS — F3341 Major depressive disorder, recurrent, in partial remission: Secondary | ICD-10-CM | POA: Diagnosis not present

## 2020-11-16 DIAGNOSIS — N1831 Chronic kidney disease, stage 3a: Secondary | ICD-10-CM | POA: Diagnosis not present

## 2020-11-16 DIAGNOSIS — E039 Hypothyroidism, unspecified: Secondary | ICD-10-CM | POA: Diagnosis not present

## 2020-11-16 DIAGNOSIS — Z4789 Encounter for other orthopedic aftercare: Secondary | ICD-10-CM | POA: Diagnosis not present

## 2020-11-16 DIAGNOSIS — E1159 Type 2 diabetes mellitus with other circulatory complications: Secondary | ICD-10-CM | POA: Diagnosis not present

## 2020-11-16 DIAGNOSIS — M810 Age-related osteoporosis without current pathological fracture: Secondary | ICD-10-CM | POA: Diagnosis not present

## 2020-11-16 DIAGNOSIS — M6281 Muscle weakness (generalized): Secondary | ICD-10-CM | POA: Diagnosis not present

## 2020-11-16 DIAGNOSIS — Z96641 Presence of right artificial hip joint: Secondary | ICD-10-CM | POA: Diagnosis not present

## 2020-11-16 DIAGNOSIS — M1611 Unilateral primary osteoarthritis, right hip: Secondary | ICD-10-CM | POA: Diagnosis not present

## 2020-11-16 DIAGNOSIS — Z741 Need for assistance with personal care: Secondary | ICD-10-CM | POA: Diagnosis not present

## 2020-11-16 DIAGNOSIS — E1122 Type 2 diabetes mellitus with diabetic chronic kidney disease: Secondary | ICD-10-CM | POA: Diagnosis not present

## 2020-11-16 DIAGNOSIS — R4189 Other symptoms and signs involving cognitive functions and awareness: Secondary | ICD-10-CM | POA: Diagnosis not present

## 2020-11-16 DIAGNOSIS — I2510 Atherosclerotic heart disease of native coronary artery without angina pectoris: Secondary | ICD-10-CM | POA: Diagnosis not present

## 2020-11-16 LAB — BASIC METABOLIC PANEL
Anion gap: 6 (ref 5–15)
Anion gap: 8 (ref 5–15)
BUN: 20 mg/dL (ref 8–23)
BUN: 17 mg/dL (ref 8–23)
CO2: 22 mmol/L (ref 22–32)
CO2: 21 mmol/L — ABNORMAL LOW (ref 22–32)
Calcium: 8.5 mg/dL — ABNORMAL LOW (ref 8.9–10.3)
Calcium: 8.2 mg/dL — ABNORMAL LOW (ref 8.9–10.3)
Chloride: 111 mmol/L (ref 98–111)
Chloride: 109 mmol/L (ref 98–111)
Creatinine, Ser: 1.17 mg/dL — ABNORMAL HIGH (ref 0.44–1.00)
Creatinine, Ser: 1.19 mg/dL — ABNORMAL HIGH (ref 0.44–1.00)
GFR, Estimated: 48 mL/min — ABNORMAL LOW (ref >60)
GFR, Estimated: 47 mL/min — ABNORMAL LOW (ref >60)
Glucose, Bld: 171 mg/dL — ABNORMAL HIGH (ref 70–99)
Glucose, Bld: 153 mg/dL — ABNORMAL HIGH (ref 70–99)
Potassium: 2.8 mmol/L — ABNORMAL LOW (ref 3.5–5.1)
Potassium: 3.9 mmol/L (ref 3.5–5.1)
Sodium: 139 mmol/L (ref 135–145)
Sodium: 138 mmol/L (ref 135–145)

## 2020-11-16 LAB — GLUCOSE, CAPILLARY
Glucose-Capillary: 153 mg/dL — ABNORMAL HIGH (ref 70–99)
Glucose-Capillary: 126 mg/dL — ABNORMAL HIGH (ref 70–99)
Glucose-Capillary: 134 mg/dL — ABNORMAL HIGH (ref 70–99)

## 2020-11-16 LAB — CBC
HCT: 32.6 % — ABNORMAL LOW (ref 36.0–46.0)
Hemoglobin: 10.1 g/dL — ABNORMAL LOW (ref 12.0–15.0)
MCH: 27.4 pg (ref 26.0–34.0)
MCHC: 31 g/dL (ref 30.0–36.0)
MCV: 88.3 fL (ref 80.0–100.0)
Platelets: 204 10*3/uL (ref 150–400)
RBC: 3.69 MIL/uL — ABNORMAL LOW (ref 3.87–5.11)
RDW: 14.2 % (ref 11.5–15.5)
WBC: 9.9 10*3/uL (ref 4.0–10.5)
nRBC: 0 % (ref 0.0–0.2)

## 2020-11-16 LAB — GASTROINTESTINAL PATHOGEN PANEL PCR
C. difficile Tox A/B, PCR: NOT DETECTED
Campylobacter, PCR: NOT DETECTED
Cryptosporidium, PCR: NOT DETECTED
E coli (ETEC) LT/ST PCR: NOT DETECTED
E coli (STEC) stx1/stx2, PCR: NOT DETECTED
E coli 0157, PCR: NOT DETECTED
Giardia lamblia, PCR: NOT DETECTED
Norovirus, PCR: NOT DETECTED
Rotavirus A, PCR: NOT DETECTED
Salmonella, PCR: NOT DETECTED
Shigella, PCR: NOT DETECTED

## 2020-11-16 LAB — RESP PANEL BY RT-PCR (FLU A&B, COVID) ARPGX2
Influenza A by PCR: NEGATIVE
Influenza B by PCR: NEGATIVE
SARS Coronavirus 2 by RT PCR: NEGATIVE

## 2020-11-16 MED ORDER — RIVAROXABAN 10 MG PO TABS: 10.0000 mg | ORAL_TABLET | Freq: Every day | ORAL | 0 refills | Status: AC

## 2020-11-16 MED ORDER — POTASSIUM CHLORIDE CRYS ER 20 MEQ PO TBCR
40.0000 meq | EXTENDED_RELEASE_TABLET | ORAL | Status: AC
  Administered 2020-11-16 (×3): 40 meq via ORAL
  Filled 2020-11-16 (×2): qty 2

## 2020-11-16 MED ORDER — METHOCARBAMOL 500 MG PO TABS: 500.0000 mg | ORAL_TABLET | Freq: Four times a day (QID) | ORAL | 0 refills | Status: DC | PRN

## 2020-11-16 MED ORDER — HYDROCODONE-ACETAMINOPHEN 5-325 MG PO TABS: 1.0000 | ORAL_TABLET | Freq: Four times a day (QID) | ORAL | 0 refills | Status: DC | PRN

## 2020-11-16 NOTE — Progress Notes (Signed)
Physical Therapy Treatment Patient Details Name: Kathleen Williamson MRN: [id number] DOB: 12/07/1940 Today's Date: 11/16/2020    History of Present Illness Pt is 80 yo female s/p R anterior THA on 11/14/20.  SHe has history including OA, DM, DKD, OP, MDD, back surgery, and CABG x 5    PT Comments    Pt continues very cooperative and up to [facility] for toileting and then ambulated short distance to recliner to perform therex program with assist.     Follow Up Recommendations  SNF;Follow surgeon's recommendation for DC plan and follow-up therapies     Equipment Recommendations  None recommended by PT    Recommendations for Other Services       Precautions / Restrictions Precautions Precautions: Fall Restrictions Weight Bearing Restrictions: No RLE Weight Bearing: Weight bearing as tolerated    Mobility  Bed Mobility Overal bed mobility: Needs Assistance Bed Mobility: Supine to Sit     Supine to sit: Min assist     General bed mobility comments: cues for sequence and use of L LE to self assist    Transfers Overall transfer level: Needs assistance Equipment used: Rolling walker (2 wheeled) Transfers: Sit to/from [facility] Sit to Stand: Min assist Stand pivot transfers: Min assist       General transfer comment: Cues for safe hand placement and min A to rise  Ambulation/Gait Ambulation/Gait assistance: Min assist Gait Distance (Feet): 8 Feet Assistive device: Rolling walker (2 wheeled) Gait Pattern/deviations: Step-to pattern;Decreased weight shift to right;Decreased stride length;Shuffle Gait velocity: decreased   General Gait Details: Increased time with cues for sequence, posture and poisiton from [employer] Mobility    Modified Rankin (Stroke Patients Only)       Balance Overall balance assessment: Needs assistance Sitting-balance support: No upper extremity supported Sitting balance-Leahy Scale: Good      Standing balance support: Bilateral upper extremity supported Standing balance-Leahy Scale: Poor Standing balance comment: requiring RW and min A at times                            Cognition Arousal/Alertness: Awake/alert Behavior During Therapy: Impulsive;WFL for tasks assessed/performed Overall Cognitive Status: Within Functional Limits for tasks assessed                                 General Comments: Apparent memory issues with pt repeating same information and questions.      Exercises Total Joint Exercises Ankle Circles/Pumps: AROM;Both;15 reps;Supine Quad Sets: AROM;Both;10 reps;Supine Heel Slides: AAROM;Right;20 reps;Supine Hip ABduction/ADduction: AAROM;Right;15 reps;Supine    General Comments        Pertinent Vitals/Pain Pain Assessment: 0-10 Pain Score: 5  Pain Location: R hip/thigh Pain Descriptors / Indicators: Aching;Burning;Sore Pain Intervention(s): Limited activity within patient's tolerance;Monitored during session;Ice applied    Home Living                      Prior Function            PT Goals (current goals can now be found in the care plan section) Acute Rehab PT Goals Patient Stated Goal: walk; return to [facility] SN[facility] at dc PT Goal Formulation: With patient Time For Goal Achievement: 11/28/20 Potential to Achieve Goals: Good Progress towards PT goals: Progressing toward goals  Frequency    7X/week      PT Plan Current plan remains appropriate    Co-evaluation              AM-PAC PT "6 Clicks" Mobility   Outcome Measure  Help needed turning from your back to your side while in a flat bed without using bedrails?: A Little Help needed moving from lying on your back to sitting on the side of a flat bed without using bedrails?: A Little Help needed moving to and from a bed to a chair (including a wheelchair)?: A Little Help needed standing up from a chair using your arms (e.g.,  wheelchair or bedside chair)?: A Little Help needed to walk in hospital room?: A Little Help needed climbing 3-5 steps with a railing? : A Lot 6 Click Score: 17    End of Session Equipment Utilized During Treatment: Gait belt Activity Tolerance: Patient tolerated treatment well Patient left: in chair;with call bell/phone within reach;with chair alarm set;with family/visitor present Nurse Communication: Mobility status PT Visit Diagnosis: Other abnormalities of gait and mobility (R26.89);Muscle weakness (generalized) (M62.81)     Time: [id number] PT Time Calculation (min) (ACUTE ONLY): 26 min  Charges:  $Gait Training: 8-22 mins $Therapeutic Exercise: 8-22 mins $Therapeutic Activity: 8-22 mins                     [place] Pager [phone] Office [phone]    Hodges Treiber 11/16/2020, 3:57 PM

## 2020-11-16 NOTE — Progress Notes (Signed)
Physical Therapy Treatment Patient Details Name: Kathleen Williamson MRN: [id number] DOB: [birth year] Today's Date: 11/16/2020    History of Present Illness Pt is 80 yo female s/p R anterior THA on 11/14/20.  SHe has history including OA, DM, DKD, OP, MDD, back surgery, and CABG x 5    PT Comments    Pt very cooperative but mildly impulsive this am and with questionable safety awareness.  Pt performed therex program and up to bathroom for toileting and hygiene and then ambulated increased distance in hall.   Follow Up Recommendations  Follow surgeon's recommendation for DC plan and follow-up therapies;SNF     Equipment Recommendations  None recommended by PT    Recommendations for Other Services       Precautions / Restrictions Precautions Precautions: Fall Restrictions Weight Bearing Restrictions: No RLE Weight Bearing: Weight bearing as tolerated    Mobility  Bed Mobility Overal bed mobility: Needs Assistance Bed Mobility: Supine to Sit     Supine to sit: Min assist     General bed mobility comments: cues for sequence and use of L LE to self assist    Transfers Overall transfer level: Needs assistance Equipment used: Rolling walker (2 wheeled) Transfers: Sit to/from Stand Sit to Stand: Min assist         General transfer comment: Cues for safe hand placement and min A to rise  Ambulation/Gait Ambulation/Gait assistance: Min assist Gait Distance (Feet): 50 Feet (and additional 15' to bathroom) Assistive device: Rolling walker (2 wheeled) Gait Pattern/deviations: Step-to pattern;Decreased weight shift to right;Decreased stride length;Shuffle Gait velocity: decreased   General Gait Details: Increased time with cues for sequence, posture and poisiton from [employer] Mobility    Modified Rankin (Stroke Patients Only)       Balance Overall balance assessment: Needs assistance Sitting-balance support: No upper extremity  supported Sitting balance-Leahy Scale: Good     Standing balance support: Bilateral upper extremity supported Standing balance-Leahy Scale: Poor Standing balance comment: requiring RW and min A at times                            Cognition Arousal/Alertness: Awake/alert Behavior During Therapy: Impulsive;WFL for tasks assessed/performed Overall Cognitive Status: Within Functional Limits for tasks assessed                                 General Comments: Apparent memory issues with pt repeating same information and questions.      Exercises Total Joint Exercises Ankle Circles/Pumps: AROM;Both;15 reps;Supine Quad Sets: AROM;Both;10 reps;Supine Heel Slides: AAROM;Right;20 reps;Supine Hip ABduction/ADduction: AAROM;Right;15 reps;Supine    General Comments        Pertinent Vitals/Pain Pain Assessment: 0-10 Pain Score: 4  Pain Location: R hip/thigh Pain Descriptors / Indicators: Aching;Burning;Sore Pain Intervention(s): Limited activity within patient's tolerance;Monitored during session;Premedicated before session    Home Living                      Prior Function            PT Goals (current goals can now be found in the care plan section) Acute Rehab PT Goals Patient Stated Goal: walk; return to [facility] at dc PT Goal Formulation: With patient Time For Goal Achievement: 11/28/20 Potential to Achieve Goals: Good  Progress towards PT goals: Progressing toward goals    Frequency    7X/week      PT Plan Discharge plan needs to be updated    Co-evaluation              AM-PAC PT "6 Clicks" Mobility   Outcome Measure  Help needed turning from your back to your side while in a flat bed without using bedrails?: A Little Help needed moving from lying on your back to sitting on the side of a flat bed without using bedrails?: A Little Help needed moving to and from a bed to a chair (including a wheelchair)?: A  Little Help needed standing up from a chair using your arms (e.g., wheelchair or bedside chair)?: A Little Help needed to walk in hospital room?: A Little Help needed climbing 3-5 steps with a railing? : A Lot 6 Click Score: 17    End of Session Equipment Utilized During Treatment: Gait belt Activity Tolerance: Patient tolerated treatment well Patient left: in chair;with call bell/phone within reach;with chair alarm set;with family/visitor present Nurse Communication: Mobility status PT Visit Diagnosis: Other abnormalities of gait and mobility (R26.89);Muscle weakness (generalized) (M62.81)     Time: [id number] PT Time Calculation (min) (ACUTE ONLY): 40 min  Charges:  $Gait Training: 8-22 mins $Therapeutic Exercise: 8-22 mins $Therapeutic Activity: 8-22 mins                     Debe Coder PT Acute Rehabilitation Services Pager [phone] Office [phone]    Kristeen Lantz 11/16/2020, 12:17 PM

## 2020-11-16 NOTE — Discharge Summary (Signed)
Physician Discharge Summary   Patient ID: Kathleen Williamson MRN: [id number] DOB/AGE: 1940-09-04 80 y.o.  Admit date: 11/14/2020 Discharge date: 11/16/20  Primary Diagnosis: Osteoarthritis, right hip   Admission Diagnoses:  Past Medical History:  Diagnosis Date   Arthritis    fingers   CAD (coronary artery disease) 2003   MI s/p 5v CABG   Carotid stenosis    [place]   CKD (chronic kidney disease) stage 3, GFR 30-59 ml/min (HCC)    Dermatomyositis ([place])    saw Dr Estanislado Pandy, improved on its own   Diabetes mellitus without complication ([place])    Forehead laceration 09/15/2017   History of chicken pox    History of measles    History of migraine none since 2003   HLD (hyperlipidemia)    Hypothyroidism    MDD (major depressive disorder), recurrent episode, moderate ([place])    h/o SI after lost husband unexpectedly 2015   Microscopic colitis 05/2014   by colonoscopy, lymphocytic, zoloft related   Myocardial infarction ([place]) 2003   Osteoporosis    DEXA 03/2013 T -3.1, DEXA 10//2016 -2.9 hip, -2.2 spine   Pancreatic cyst    Dr Ralene Ok, no f/u needed   Prediabetes 2014   Subclavian steal syndrome    Urine incontinence    Discharge Diagnoses:   Principal Problem:   Osteoarthritis of right hip Active Problems:   Primary osteoarthritis of right hip  Estimated body mass index is 23.38 kg/m as calculated from the following:   Height as of this encounter: _0  (1.6 m).   Weight as of this encounter: 59.9 kg.  Procedure:  Procedure(s) (LRB): TOTAL HIP ARTHROPLASTY ANTERIOR APPROACH (Right)   Consults: None  HPI: Kathleen Williamson is a 80 y.o. female who has advanced end-stage arthritis of their Right  hip with progressively worsening pain and dysfunction.The patient has failed nonoperative management and presents for total hip arthroplasty.  Laboratory Data: Admission on 11/14/2020  Component Date Value Ref Range Status   ABO/RH(D) 11/14/2020    Final                   Value:A  POS Performed at [facility], [place] [address], [place], [place] [address]    Glucose-Capillary 11/14/2020 129 (A) 70 - 99 mg/dL Final   Glucose reference range applies only to samples taken after fasting for at least 8 hours.   Glucose-Capillary 11/14/2020 150 (A) 70 - 99 mg/dL Final   Glucose reference range applies only to samples taken after fasting for at least 8 hours.   Comment 1 11/14/2020 Notify RN   Final   Glucose-Capillary 11/14/2020 230 (A) 70 - 99 mg/dL Final   Glucose reference range applies only to samples taken after fasting for at least 8 hours.   WBC 11/15/2020 9.8  4.0 - 10.5 K/uL Final   RBC 11/15/2020 3.89  3.87 - 5.11 MIL/uL Final   Hemoglobin 11/15/2020 10.6 (A) 12.0 - 15.0 g/dL Final   HCT 11/15/2020 33.8 (A) 36.0 - 46.0 % Final   MCV 11/15/2020 86.9  80.0 - 100.0 fL Final   MCH 11/15/2020 27.2  26.0 - 34.0 pg Final   MCHC 11/15/2020 31.4  30.0 - 36.0 g/dL Final   RDW 11/15/2020 13.8  11.5 - 15.5 % Final   Platelets 11/15/2020 253  150 - 400 K/uL Final   nRBC 11/15/2020 0.0  0.0 - 0.2 % Final   Performed at [facility], [place] [address] [place],  [place] [address]   Sodium 11/15/2020 142  135 - 145 mmol/L Final   Potassium 11/15/2020 3.9  3.5 - 5.1 mmol/L Final   Chloride 11/15/2020 109  98 - 111 mmol/L Final   CO2 11/15/2020 25  22 - 32 mmol/L Final   Glucose, Bld 11/15/2020 177 (A) 70 - 99 mg/dL Final   Glucose reference range applies only to samples taken after fasting for at least 8 hours.   BUN 11/15/2020 14  8 - 23 mg/dL Final   Creatinine, Ser 11/15/2020 1.18 (A) 0.44 - 1.00 mg/dL Final   Calcium 11/15/2020 8.6 (A) 8.9 - 10.3 mg/dL Final   GFR, Estimated 11/15/2020 47 (A) >60 mL/min Final   Comment: (NOTE) Calculated using the CKD-EPI Creatinine Equation (2021)    Anion gap 11/15/2020 8  5 - 15 Final   Performed at [facility], [place] [address] [place], [place] [address]   Glucose-Capillary  11/14/2020 239 (A) 70 - 99 mg/dL Final   Glucose reference range applies only to samples taken after fasting for at least 8 hours.   Glucose-Capillary 11/15/2020 152 (A) 70 - 99 mg/dL Final   Glucose reference range applies only to samples taken after fasting for at least 8 hours.   Glucose-Capillary 11/15/2020 141 (A) 70 - 99 mg/dL Final   Glucose reference range applies only to samples taken after fasting for at least 8 hours.   Glucose-Capillary 11/15/2020 127 (A) 70 - 99 mg/dL Final   Glucose reference range applies only to samples taken after fasting for at least 8 hours.   WBC 11/16/2020 9.9  4.0 - 10.5 K/uL Final   RBC 11/16/2020 3.69 (A) 3.87 - 5.11 MIL/uL Final   Hemoglobin 11/16/2020 10.1 (A) 12.0 - 15.0 g/dL Final   HCT 11/16/2020 32.6 (A) 36.0 - 46.0 % Final   MCV 11/16/2020 88.3  80.0 - 100.0 fL Final   MCH 11/16/2020 27.4  26.0 - 34.0 pg Final   MCHC 11/16/2020 31.0  30.0 - 36.0 g/dL Final   RDW 11/16/2020 14.2  11.5 - 15.5 % Final   Platelets 11/16/2020 204  150 - 400 K/uL Final   nRBC 11/16/2020 0.0  0.0 - 0.2 % Final   Performed at [facility], [place] [address], [place], [place] [address]   Sodium 11/16/2020 139  135 - 145 mmol/L Final   Potassium 11/16/2020 2.8 (A) 3.5 - 5.1 mmol/L Final   DELTA CHECK NOTED   Chloride 11/16/2020 111  98 - 111 mmol/L Final   CO2 11/16/2020 22  22 - 32 mmol/L Final   Glucose, Bld 11/16/2020 171 (A) 70 - 99 mg/dL Final   Glucose reference range applies only to samples taken after fasting for at least 8 hours.   BUN 11/16/2020 20  8 - 23 mg/dL Final   Creatinine, Ser 11/16/2020 1.17 (A) 0.44 - 1.00 mg/dL Final   Calcium 11/16/2020 8.5 (A) 8.9 - 10.3 mg/dL Final   GFR, Estimated 11/16/2020 48 (A) >60 mL/min Final   Comment: (NOTE) Calculated using the CKD-EPI Creatinine Equation (2021)    Anion gap 11/16/2020 6  5 - 15 Final   Performed at [facility], [place] [address], [place], [place] [address]    Glucose-Capillary 11/15/2020 145 (A) 70 - 99 mg/dL Final   Glucose reference range applies only to samples taken after fasting for at least 8 hours.  Orders Only on 11/12/2020  Component Date Value Ref Range Status   SARS Coronavirus  2 11/12/2020 RESULT: NEGATIVE   Final   Comment: RESULT: NEGATIVESARS-CoV-2 INTERPRETATION:A NEGATIVE  test result means that SARS-CoV-2 RNA was not present in the specimen above the limit of detection of this test. This does not preclude a possible SARS-CoV-2 infection and should not be used as the  sole basis for patient management decisions. Negative results must be combined with clinical observations, patient history, and epidemiological information. Optimum specimen types and timing for peak viral levels during infections caused by SARS-CoV-2  have not been determined. Collection of multiple specimens or types of specimens may be necessary to detect virus. Improper specimen collection and handling, sequence variability under primers/probes, or organism present below the limit of detection may  lead to false negative results. Positive and negative predictive values of testing are highly dependent on prevalence. False negative test results are more likely when prevalence of disease is high.The expected result is NEGATIVE.Fact S                          heet for  Healthcare Providers: LocalChronicle.no Sheet for Patients: SalonLookup.es Reference Range - Negative   Hospital Outpatient Visit on 11/06/2020  Component Date Value Ref Range Status   WBC 11/06/2020 8.8  4.0 - 10.5 K/uL Final   RBC 11/06/2020 4.29  3.87 - 5.11 MIL/uL Final   Hemoglobin 11/06/2020 11.6 (A) 12.0 - 15.0 g/dL Final   HCT 11/06/2020 37.6  36.0 - 46.0 % Final   MCV 11/06/2020 87.6  80.0 - 100.0 fL Final   MCH 11/06/2020 27.0  26.0 - 34.0 pg Final   MCHC 11/06/2020 30.9  30.0 - 36.0 g/dL Final   RDW 11/06/2020 13.9  11.5 - 15.5 %  Final   Platelets 11/06/2020 305  150 - 400 K/uL Final   nRBC 11/06/2020 0.0  0.0 - 0.2 % Final   Performed at [facility], [place] [address], [place], [place] [address]   Sodium 11/06/2020 142  135 - 145 mmol/L Final   Potassium 11/06/2020 3.3 (A) 3.5 - 5.1 mmol/L Final   Chloride 11/06/2020 111  98 - 111 mmol/L Final   CO2 11/06/2020 25  22 - 32 mmol/L Final   Glucose, Bld 11/06/2020 121 (A) 70 - 99 mg/dL Final   Glucose reference range applies only to samples taken after fasting for at least 8 hours.   BUN 11/06/2020 16  8 - 23 mg/dL Final   Creatinine, Ser 11/06/2020 1.29 (A) 0.44 - 1.00 mg/dL Final   Calcium 11/06/2020 9.0  8.9 - 10.3 mg/dL Final   Total Protein 11/06/2020 7.0  6.5 - 8.1 g/dL Final   Albumin 11/06/2020 4.2  3.5 - 5.0 g/dL Final   AST 11/06/2020 16  15 - 41 U/L Final   ALT 11/06/2020 13  0 - 44 U/L Final   Alkaline Phosphatase 11/06/2020 50  38 - 126 U/L Final   Total Bilirubin 11/06/2020 0.6  0.3 - 1.2 mg/dL Final   GFR, Estimated 11/06/2020 42 (A) >60 mL/min Final   Comment: (NOTE) Calculated using the CKD-EPI Creatinine Equation (2021)    Anion gap 11/06/2020 6  5 - 15 Final   Performed at [facility], [place] [address], [place], [place] [address]   Prothrombin Time 11/06/2020 12.6  11.4 - 15.2 seconds Final   INR 11/06/2020 0.9  0.8 - 1.2 Final   Comment: (NOTE) INR goal varies based on device and disease states. Performed at [facility], [place] [address] [place], [place] [address]    aPTT 11/06/2020 28  24 - 36 seconds Final   Performed at [facility], [place] [address], [place], [place] [address]   ABO/RH(D) 11/06/2020 A POS   Final   Antibody Screen 11/06/2020 NEG   Final   Sample Expiration 11/06/2020 11/17/2020,2359   Final   Extend sample reason 11/06/2020    Final                   Value:NO TRANSFUSIONS OR PREGNANCY IN THE PAST 3 MONTHS Performed at [place] [address], [place], [place] [address]    MRSA, PCR 11/06/2020 NEGATIVE  NEGATIVE Final   Staphylococcus aureus 11/06/2020 NEGATIVE  NEGATIVE Final   Comment: (NOTE) The Xpert SA Assay (FDA approved for NASAL specimens in patients 30 years of age and older), is one component of a comprehensive surveillance program. It is not intended to diagnose infection nor to guide or monitor treatment. Performed at [facility], [place] [address], [place], [place] [address]    Glucose-Capillary 11/06/2020 116 (A) 70 - 99 mg/dL Final   Glucose reference range applies only to samples taken after fasting for at least 8 hours.  Lab on 11/01/2020  Component Date Value Ref Range Status   Campylobacter, PCR 11/01/2020 CANCELED   Final   Comment: Unable to report. After repeat analysis, non-amplification of the internal control suggests the presence of PCR inhibitors in the patient specimen. An additional specimen should be submitted for testing if clinically warranted.  Result canceled by the ancillary.   Admission on 10/29/2020, Discharged on 10/29/2020  Component Date Value Ref Range Status   Color, Urine 10/29/2020 YELLOW (A) YELLOW Final   APPearance 10/29/2020 CLEAR (A) CLEAR Final   Specific Gravity, Urine 10/29/2020 1.011  1.005 - 1.030 Final   pH 10/29/2020 6.0  5.0 - 8.0 Final   Glucose, UA 10/29/2020 NEGATIVE  NEGATIVE mg/dL Final   Hgb urine dipstick 10/29/2020 NEGATIVE  NEGATIVE Final   Bilirubin Urine 10/29/2020 NEGATIVE  NEGATIVE Final   Ketones, ur 10/29/2020 NEGATIVE  NEGATIVE mg/dL Final   Protein, ur 10/29/2020 NEGATIVE  NEGATIVE mg/dL Final   Nitrite 10/29/2020 NEGATIVE  NEGATIVE Final   Leukocytes,Ua 10/29/2020 NEGATIVE  NEGATIVE Final   RBC / HPF 10/29/2020 0-5  0 - 5 RBC/hpf Final   WBC, UA 10/29/2020 0-5  0 - 5 WBC/hpf Final   Bacteria, UA 10/29/2020 NONE SEEN  NONE SEEN Final   Squamous Epithelial / LPF 10/29/2020 0-5  0 - 5 Final   Mucus  10/29/2020 PRESENT   Final   Hyaline Casts, UA 10/29/2020 PRESENT   Final   Performed at [facility], [address] [place] [place], [place] [address]  Admission on 10/26/2020, Discharged on 10/26/2020  Component Date Value Ref Range Status   Color, Urine 10/26/2020 YELLOW (A) YELLOW Final   APPearance 10/26/2020 HAZY (A) CLEAR Final   Specific Gravity, Urine 10/26/2020 1.003 (A) 1.005 - 1.030 Final   pH 10/26/2020 6.0  5.0 - 8.0 Final   Glucose, UA 10/26/2020 NEGATIVE  NEGATIVE mg/dL Final   Hgb urine dipstick 10/26/2020 SMALL (A) NEGATIVE Final   Bilirubin Urine 10/26/2020 NEGATIVE  NEGATIVE Final   Ketones, ur 10/26/2020 NEGATIVE  NEGATIVE mg/dL Final   Protein, ur 10/26/2020 NEGATIVE  NEGATIVE mg/dL Final   Nitrite 10/26/2020 POSITIVE (A) NEGATIVE Final   Leukocytes,Ua 10/26/2020 LARGE (A) NEGATIVE Final   WBC, UA  10/26/2020 >50 (A) 0 - 5 WBC/hpf Final   Bacteria, UA 10/26/2020 MANY (A) NONE SEEN Final   Squamous Epithelial / LPF 10/26/2020 NONE SEEN  0 - 5 Final   Performed at [facility] [address] [place], [place] [address]  Admission on 10/06/2020, Discharged on 10/07/2020  Component Date Value Ref Range Status   Glucose-Capillary 10/06/2020 139 (A) 70 - 99 mg/dL Final   Glucose reference range applies only to samples taken after fasting for at least 8 hours.   Comment 1 10/06/2020 Notify RN   Final   Comment 2 10/06/2020 Document in Chart   Final   Prothrombin Time 10/06/2020 12.7  11.4 - 15.2 seconds Final   INR 10/06/2020 1.0  0.8 - 1.2 Final   Comment: (NOTE) INR goal varies based on device and disease states. Performed at [facility], [place] [place], [place] [address]    aPTT 10/06/2020 29  24 - 36 seconds Final   Performed at [facility], [place] [place], [place] [address]   WBC 10/06/2020 8.8  4.0 - 10.5 K/uL Final   RBC 10/06/2020 4.01  3.87 - 5.11 MIL/uL Final   Hemoglobin 10/06/2020 11.1 (A) 12.0 -  15.0 g/dL Final   HCT 10/06/2020 35.2 (A) 36.0 - 46.0 % Final   MCV 10/06/2020 87.8  80.0 - 100.0 fL Final   MCH 10/06/2020 27.7  26.0 - 34.0 pg Final   MCHC 10/06/2020 31.5  30.0 - 36.0 g/dL Final   RDW 10/06/2020 13.6  11.5 - 15.5 % Final   Platelets 10/06/2020 297  150 - 400 K/uL Final   nRBC 10/06/2020 0.0  0.0 - 0.2 % Final   Performed at [facility], [address] [place], [place] [address]   Neutrophils Relative % 10/06/2020 56  % Final   Neutro Abs 10/06/2020 4.9  1.7 - 7.7 K/uL Final   Lymphocytes Relative 10/06/2020 33  % Final   Lymphs Abs 10/06/2020 2.9  0.7 - 4.0 K/uL Final   Monocytes Relative 10/06/2020 7  % Final   Monocytes Absolute 10/06/2020 0.6  0.1 - 1.0 K/uL Final   Eosinophils Relative 10/06/2020 3  % Final   Eosinophils Absolute 10/06/2020 0.3  0.0 - 0.5 K/uL Final   Basophils Relative 10/06/2020 1  % Final   Basophils Absolute 10/06/2020 0.1  0.0 - 0.1 K/uL Final   Immature Granulocytes 10/06/2020 0  % Final   Abs Immature Granulocytes 10/06/2020 0.02  0.00 - 0.07 K/uL Final   Performed at [facility], [place], [place] [address]   Sodium 10/06/2020 137  135 - 145 mmol/L Final   Potassium 10/06/2020 3.8  3.5 - 5.1 mmol/L Final   Chloride 10/06/2020 107  98 - 111 mmol/L Final   CO2 10/06/2020 22  22 - 32 mmol/L Final   Glucose, Bld 10/06/2020 132 (A) 70 - 99 mg/dL Final   Glucose reference range applies only to samples taken after fasting for at least 8 hours.   BUN 10/06/2020 11  8 - 23 mg/dL Final   Creatinine, Ser 10/06/2020 1.33 (A) 0.44 - 1.00 mg/dL Final   Calcium 10/06/2020 8.8 (A) 8.9 - 10.3 mg/dL Final   Total Protein 10/06/2020 7.2  6.5 - 8.1 g/dL Final   Albumin 10/06/2020 3.9  3.5 - 5.0 g/dL Final   AST 10/06/2020 25  15 - 41 U/L Final   ALT 10/06/2020 15  0 - 44 U/L  Final   Alkaline Phosphatase 10/06/2020 64  38 - 126 U/L Final   Total Bilirubin 10/06/2020 0.6  0.3 - 1.2 mg/dL Final   GFR, Estimated  10/06/2020 41 (A) >60 mL/min Final   Comment: (NOTE) Calculated using the CKD-EPI Creatinine Equation (2021)    Anion gap 10/06/2020 8  5 - 15 Final   Performed at [facility], [place], [place] [address]   Troponin I (High Sensitivity) 10/06/2020 4  <18 ng/L Final   Comment: (NOTE) Elevated high sensitivity troponin I (hsTnI) values and significant  changes across serial measurements may suggest ACS but many other  chronic and acute conditions are known to elevate hsTnI results.  Refer to the "Links" section for chest pain algorithms and additional  guidance. Performed at [facility], [place] [address] [place], [place] [address]    TSH 10/06/2020 1.378  0.350 - 4.500 uIU/mL Final   Comment: Performed by a 3rd Generation assay with a functional sensitivity of <=0.01 uIU/mL. Performed at [facility], [address] [place], [place] [address]    Free T4 10/06/2020 1.03  0.61 - 1.12 ng/dL Final   Comment: (NOTE) Biotin ingestion may interfere with free T4 tests. If the results are inconsistent with the TSH level, previous test results, or the clinical presentation, then consider biotin interference. If needed, order repeat testing after stopping biotin. Performed at [facility], [address] [place], [place] [address]   Office Visit on 10/01/2020  Component Date Value Ref Range Status   Sodium 10/01/2020 141  135 - 145 mEq/L Final   Potassium 10/01/2020 4.3  3.5 - 5.1 mEq/L Final   Chloride 10/01/2020 107  96 - 112 mEq/L Final   CO2 10/01/2020 24  19 - 32 mEq/L Final   Glucose, Bld 10/01/2020 118 (A) 70 - 99 mg/dL Final   BUN 10/01/2020 13  6 - 23 mg/dL Final   Creatinine, Ser 10/01/2020 1.29 (A) 0.40 - 1.20 mg/dL Final   Total Bilirubin 10/01/2020 0.4  0.2 - 1.2 mg/dL Final   Alkaline Phosphatase 10/01/2020 64  39 - 117 U/L Final   AST 10/01/2020 11  0 - 37 U/L Final   ALT 10/01/2020 11  0 - 35 U/L Final   Total  Protein 10/01/2020 6.7  6.0 - 8.3 g/dL Final   Albumin 10/01/2020 4.3  3.5 - 5.2 g/dL Final   GFR 10/01/2020 39.68 (A) >60.00 mL/min Final   Calculated using the CKD-EPI Creatinine Equation (2021)   Calcium 10/01/2020 9.3  8.4 - 10.5 mg/dL Final   TSH 10/01/2020 1.10  0.35 - 4.50 uIU/mL Final   Free T4 10/01/2020 0.93  0.60 - 1.60 ng/dL Final   Comment: Specimens from patients who are undergoing biotin therapy and /or ingesting biotin supplements may contain high levels of biotin.  The higher biotin concentration in these specimens interferes with this Free T4 assay.  Specimens that contain high levels  of biotin may cause false high results for this Free T4 assay.  Please interpret results in light of the total clinical presentation of the patient.     Hgb A1c MFr Bld 10/01/2020 6.7 (A) 4.6 - 6.5 % Final   Glycemic Control Guidelines for People with Diabetes:Non Diabetic:  <6%Goal of Therapy: <7%Additional Action Suggested:  >8%    WBC 10/01/2020 8.4  4.0 - 10.5 K/uL Final   RBC 10/01/2020 4.24  3.87 - 5.11 Mil/uL Final   Hemoglobin 10/01/2020 12.0  12.0 -  15.0 g/dL Final   HCT 10/01/2020 35.9 (A) 36.0 - 46.0 % Final   MCV 10/01/2020 84.8  78.0 - 100.0 fl Final   MCHC 10/01/2020 33.3  30.0 - 36.0 g/dL Final   RDW 10/01/2020 14.4  11.5 - 15.5 % Final   Platelets 10/01/2020 323.0  150.0 - 400.0 K/uL Final   Neutrophils Relative % 10/01/2020 69.3  43.0 - 77.0 % Final   Lymphocytes Relative 10/01/2020 20.6  12.0 - 46.0 % Final   Monocytes Relative 10/01/2020 6.6  3.0 - 12.0 % Final   Eosinophils Relative 10/01/2020 2.6  0.0 - 5.0 % Final   Basophils Relative 10/01/2020 0.9  0.0 - 3.0 % Final   Neutro Abs 10/01/2020 5.8  1.4 - 7.7 K/uL Final   Lymphs Abs 10/01/2020 1.7  0.7 - 4.0 K/uL Final   Monocytes Absolute 10/01/2020 0.6  0.1 - 1.0 K/uL Final   Eosinophils Absolute 10/01/2020 0.2  0.0 - 0.7 K/uL Final   Basophils Absolute 10/01/2020 0.1  0.0 - 0.1 K/uL Final   INR 10/01/2020 1.0  0.8 -  1.0 ratio Final   Prothrombin Time 10/01/2020 11.0  9.6 - 13.1 sec Final   Color, UA 10/03/2020 yellow   Final   Clarity, UA 10/03/2020 clear   Final   Glucose, UA 10/03/2020 Negative  Negative Final   Bilirubin, UA 10/03/2020 negative   Final   Ketones, UA 10/03/2020 negative   Final   Spec Grav, UA 10/03/2020 1.015  1.010 - 1.025 Final   Blood, UA 10/03/2020 negative   Final   pH, UA 10/03/2020 6.0  5.0 - 8.0 Final   Protein, UA 10/03/2020 Negative  Negative Final   Urobilinogen, UA 10/03/2020 0.2  0.2 or 1.0 E.U./dL Final   Nitrite, UA 10/03/2020 negative   Final   Leukocytes, UA 10/03/2020 Negative  Negative Final     X-Rays:DG Pelvis Portable  Result Date: 11/14/2020 CLINICAL DATA:  Right hip arthroplasty EXAM: PORTABLE PELVIS 1-2 VIEWS; OPERATIVE RIGHT HIP WITH PELVIS COMPARISON:  None. FLUOROSCOPY TIME:  11 seconds FINDINGS: Intraoperative fluoroscopic images and postoperative pelvic radiograph demonstrate right hip total arthroplasty with expected overlying postoperative change. No evidence of perihardware fracture or component malpositioning. IMPRESSION: Intraoperative fluoroscopic images and postoperative pelvic radiograph demonstrate right hip total arthroplasty with expected overlying postoperative change. No evidence of perihardware fracture or component malpositioning. Electronically Signed   By: Eddie Candle M.D.   On: 11/14/2020 13:50   DG C-Arm 1-60 Min-No Report  Result Date: 11/14/2020 Fluoroscopy was utilized by the requesting physician.  No radiographic interpretation.   DG HIP OPERATIVE UNILAT W OR W/O PELVIS RIGHT  Result Date: 11/14/2020 CLINICAL DATA:  Right hip arthroplasty EXAM: PORTABLE PELVIS 1-2 VIEWS; OPERATIVE RIGHT HIP WITH PELVIS COMPARISON:  None. FLUOROSCOPY TIME:  11 seconds FINDINGS: Intraoperative fluoroscopic images and postoperative pelvic radiograph demonstrate right hip total arthroplasty with expected overlying postoperative change. No evidence of  perihardware fracture or component malpositioning. IMPRESSION: Intraoperative fluoroscopic images and postoperative pelvic radiograph demonstrate right hip total arthroplasty with expected overlying postoperative change. No evidence of perihardware fracture or component malpositioning. Electronically Signed   By: Eddie Candle M.D.   On: 11/14/2020 13:50    EKG: Orders placed or performed during the hospital encounter of 10/06/20   ED EKG   ED EKG   EKG     Hospital Course: Kathleen Williamson is a 80 y.o. who was admitted to [facility]. They were brought to the operating room  on 11/14/2020 and underwent Procedure(s): TOTAL HIP ARTHROPLASTY ANTERIOR APPROACH.  Patient tolerated the procedure well and was later transferred to the recovery room and then to the orthopaedic floor for postoperative care. They were given PO and IV analgesics for pain control following their surgery. They were given 24 hours of postoperative antibiotics of  Anti-infectives (From admission, onward)    Start     Dose/Rate Route Frequency Ordered Stop   11/14/20 1700  ceFAZolin (ANCEF) IVPB 2g/100 mL premix        2 g 200 mL/hr over 30 Minutes Intravenous Every 6 hours 11/14/20 1529 11/14/20 2317   11/14/20 1000  ceFAZolin (ANCEF) IVPB 2g/100 mL premix        2 g 200 mL/hr over 30 Minutes Intravenous On call to O.R. 11/14/20 [id number] 11/14/20 1126      and started on DVT prophylaxis in the form of Xarelto.   PT and OT were ordered for total joint protocol. Discharge planning consulted to help with postop disposition and equipment needs. Patient had a good night on the evening of surgery. They started to get up OOB with therapy on POD #1. Continued to work with therapy into POD #2. Pt was seen during rounds on day two and was ready to discharge. Bed was arranged at [facility] and she was discharged on POD #2 in stable condition.  Note: Potassium noted to be 2.8 on day 2. Three doses of 40 mEq Kcl were  ordered and BMP was repeated at 12:30 pm to ensure normal levels prior to discharge.   Diet: Cardiac diet Activity: WBAT Follow-up: in 2 weeks Disposition: Skilled nursing facility Discharged Condition: stable   Discharge Instructions     Call MD / Call 911   Complete by: As directed    If you experience chest pain or shortness of breath, CALL 911 and be transported to the hospital emergency room.  If you develope a fever above 101 F, pus (white drainage) or increased drainage or redness at the wound, or calf pain, call your surgeon's office.   Change dressing   Complete by: As directed    You have an adhesive waterproof bandage over the incision. Leave this in place until your first follow-up appointment. Once you remove this you will not need to place another bandage.   Constipation Prevention   Complete by: As directed    Drink plenty of fluids.  Prune juice may be helpful.  You may use a stool softener, such as Colace (over the counter) 100 mg twice a day.  Use MiraLax (over the counter) for constipation as needed.   Diet - low sodium heart healthy   Complete by: As directed    Do not sit on low chairs, stoools or toilet seats, as it may be difficult to get up from low surfaces   Complete by: As directed    Driving restrictions   Complete by: As directed    No driving for two weeks   Post-operative opioid taper instructions:   Complete by: As directed    POST-OPERATIVE OPIOID TAPER INSTRUCTIONS: It is important to wean off of your opioid medication as soon as possible. If you do not need pain medication after your surgery it is ok to stop day one. Opioids include: Codeine, Hydrocodone(Norco, Vicodin), Oxycodone(Percocet, oxycontin) and hydromorphone amongst others.  Long term and even short term use of opiods can cause: Increased pain response Dependence Constipation Depression Respiratory depression And more.  Withdrawal symptoms  can include Flu like symptoms Nausea,  vomiting And more Techniques to manage these symptoms Hydrate well Eat regular healthy meals Stay active Use relaxation techniques(deep breathing, meditating, yoga) Do Not substitute Alcohol to help with tapering If you have been on opioids for less than two weeks and do not have pain than it is ok to stop all together.  Plan to wean off of opioids This plan should start within one week post op of your joint replacement. Maintain the same interval or time between taking each dose and first decrease the dose.  Cut the total daily intake of opioids by one tablet each day Next start to increase the time between doses. The last dose that should be eliminated is the evening dose.      TED hose   Complete by: As directed    Use stockings (TED hose) for three weeks on both leg(s).  You may remove them at night for sleeping.   Weight bearing as tolerated   Complete by: As directed       Allergies as of 11/16/2020       Reactions   Sulfa Antibiotics Nausea Only   Crestor [rosuvastatin Calcium] Other (See Comments)   Leg ache        Medication List     STOP taking these medications    aspirin EC 81 MG tablet   CoQ10 100 MG Caps       TAKE these medications    Accu-Chek FastClix Lancets Misc USE AS INSTRUCTED TO CHECK BLOOD SUGAR ONCE DAILY   Accu-Chek Guide test strip Generic drug: glucose blood USE AS INSTRUCTED TO CHECK BLOOD SUGAR ONCE DAILY   Accu-Chek Guide w/Device Kit 1 each by Does not apply route as directed. Use as instructed to check blood sugar once daily   acetaminophen 325 MG tablet Commonly known as: TYLENOL Take 650 mg by mouth every 6 (six) hours as needed for moderate pain or headache.   denosumab 60 MG/ML Sosy injection Commonly known as: PROLIA Inject 60 mg into the skin every 6 (six) months.   HYDROcodone-acetaminophen 5-325 MG tablet Commonly known as: NORCO/VICODIN Take 1-2 tablets by mouth every 6 (six) hours as needed for moderate pain  or severe pain (pain score 4-6).   levothyroxine 50 MCG tablet Commonly known as: SYNTHROID Take 1 tablet (50 mcg total) by mouth daily.   loperamide 2 MG tablet Commonly known as: IMODIUM A-D Take 2 mg by mouth 4 (four) times daily as needed for diarrhea or loose stools.   methocarbamol 500 MG tablet Commonly known as: ROBAXIN Take 1 tablet (500 mg total) by mouth every 6 (six) hours as needed for muscle spasms.   mirtazapine 15 MG tablet Commonly known as: REMERON Take 1 tablet (15 mg total) by mouth at bedtime.   rivaroxaban 10 MG Tabs tablet Commonly known as: XARELTO Take 1 tablet (10 mg total) by mouth daily with breakfast for 19 days. Then resume one 81 mg aspirin once a day.   venlafaxine XR 150 MG 24 hr capsule Commonly known as: EFFEXOR-XR Take 1 capsule (150 mg total) by mouth daily with breakfast.       ASK your doctor about these medications    clonazePAM 0.5 MG tablet Commonly known as: KLONOPIN Take 1 tablet (0.5 mg total) by mouth daily as needed for anxiety (sedation precautions).   ezetimibe 10 MG tablet Commonly known as: ZETIA TAKE ONE TABLET BY MOUTH ONCE DAILY   nitroGLYCERIN 0.4 MG/SPRAY spray Commonly  known as: NITROLINGUAL Place 1 spray under the tongue every 5 (five) minutes as needed.   pravastatin 80 MG tablet Commonly known as: PRAVACHOL TAKE 1 TABLET BY MOUTH EVERY DAY IN THE EVENING               Discharge Care Instructions  (From admission, onward)           Start     Ordered   11/16/20 0000  Weight bearing as tolerated        11/16/20 0735   11/16/20 0000  Change dressing       Comments: You have an adhesive waterproof bandage over the incision. Leave this in place until your first follow-up appointment. Once you remove this you will not need to place another bandage.   11/16/20 0735            Follow-up Information     Gaynelle Arabian, MD Follow up.   Specialty: Orthopedic Surgery Why: You are scheduled for  an appointment on 11/27/20 at 1:30 pm Contact information: [address] [place] [address] [phone]                 Signed: Theresa Duty, PA-C Orthopedic Surgery 11/16/2020, 7:35 AM

## 2020-11-16 NOTE — TOC Transition Note (Signed)
Transition of Care [facility]) - CM/SW Discharge Note  Patient Details  Name: KAIRAH BRINKMEYER MRN: [id number] Date of Birth: 27-Sep-1940  [facility] CM/SW Contact:  Sherie Don, LCSW Phone Number: 11/16/2020, 3:44 PM  Clinical Narrative: PASRR documents were uploaded to [place] [facility]. PASRR received: [id number]. CSW notified by HTA that SNF was approved (auth #: [id number]), but transportation was declined. CSW confirmed with patient her sister-in-law is willing to transport her.  CSW spoke with Seth Bake at [facility]. Patient will go to room 112 and the number for report is [phone]. DME was picked up by [employer]. Discharge summary, discharge orders, and SNF transfer report faxed to facility in hub. Patient can discharge to facility once her COVID test comes back negative. TOC signing off.  Final next level of care: [place] Barriers to Discharge: Barriers Resolved  Patient Goals and CMS Choice Patient states their goals for this hospitalization and ongoing recovery are:: Go to [facility] for rehab CMS Medicare.gov Compare Post Acute Care list provided to:: Patient Choice offered to / list presented to : Patient  Discharge Placement PASRR number recieved: 11/16/20     Patient chooses bed at: [facility] Patient to be transferred to facility by: Family Patient and family notified of of transfer: 11/16/20  Discharge Plan and Services In-house Referral: Clinical Social Work Post Acute Care Choice: [place]          DME Arranged: 3-N-1, Environmental consultant rolling DME Agency: Economist spoke with at DME Agency: Pre-arranged in orthopedist's office HH Arranged: NA [place] Agency: NA  Readmission Risk Interventions No flowsheet data found.

## 2020-11-16 NOTE — Progress Notes (Signed)
Report given to [facility], [place], Therapist, sports.

## 2020-11-17 NOTE — Telephone Encounter (Signed)
Pt ready for scheduling on or after 01/24/21  Out-of-pocket cost due at time of visit: $280  Primary: HTA [place] co-insurance: 20% ($255) Admin fee co-insurance: 20% ($25)  Secondary: n/a [place] co-insurance:  Admin fee co-insurance:   Deductible: does not apply  Prior Auth: not required PA# Valid:

## 2020-11-18 ENCOUNTER — Encounter (HOSPITAL_COMMUNITY): Payer: Self-pay | Admitting: Orthopedic Surgery

## 2020-11-19 DIAGNOSIS — E1159 Type 2 diabetes mellitus with other circulatory complications: Secondary | ICD-10-CM | POA: Diagnosis not present

## 2020-11-19 DIAGNOSIS — M1611 Unilateral primary osteoarthritis, right hip: Secondary | ICD-10-CM | POA: Diagnosis not present

## 2020-11-19 DIAGNOSIS — F3341 Major depressive disorder, recurrent, in partial remission: Secondary | ICD-10-CM | POA: Diagnosis not present

## 2020-11-19 DIAGNOSIS — N1831 Chronic kidney disease, stage 3a: Secondary | ICD-10-CM | POA: Diagnosis not present

## 2020-11-19 DIAGNOSIS — I2510 Atherosclerotic heart disease of native coronary artery without angina pectoris: Secondary | ICD-10-CM | POA: Diagnosis not present

## 2020-11-26 LAB — BASIC METABOLIC PANEL
Creatinine: 1.2 — AB (ref 0.5–1.1)
Glucose: 157
Potassium: 3.9 (ref 3.4–5.3)

## 2020-11-30 ENCOUNTER — Encounter: Payer: Self-pay | Admitting: Family Medicine

## 2020-12-11 ENCOUNTER — Ambulatory Visit (INDEPENDENT_AMBULATORY_CARE_PROVIDER_SITE_OTHER): Payer: PPO | Admitting: Family Medicine

## 2020-12-11 ENCOUNTER — Telehealth: Payer: Self-pay | Admitting: Family Medicine

## 2020-12-11 ENCOUNTER — Other Ambulatory Visit: Payer: Self-pay

## 2020-12-11 ENCOUNTER — Encounter: Payer: Self-pay | Admitting: Family Medicine

## 2020-12-11 VITALS — BP 160/90 | HR 87 | Temp 97.8°F | Ht 63.0 in | Wt 132.1 lb

## 2020-12-11 DIAGNOSIS — I10 Essential (primary) hypertension: Secondary | ICD-10-CM | POA: Insufficient documentation

## 2020-12-11 DIAGNOSIS — F332 Major depressive disorder, recurrent severe without psychotic features: Secondary | ICD-10-CM

## 2020-12-11 DIAGNOSIS — R030 Elevated blood-pressure reading, without diagnosis of hypertension: Secondary | ICD-10-CM

## 2020-12-11 DIAGNOSIS — E1122 Type 2 diabetes mellitus with diabetic chronic kidney disease: Secondary | ICD-10-CM

## 2020-12-11 DIAGNOSIS — Z96641 Presence of right artificial hip joint: Secondary | ICD-10-CM | POA: Diagnosis not present

## 2020-12-11 DIAGNOSIS — N183 Chronic kidney disease, stage 3 unspecified: Secondary | ICD-10-CM

## 2020-12-11 DIAGNOSIS — E118 Type 2 diabetes mellitus with unspecified complications: Secondary | ICD-10-CM | POA: Diagnosis not present

## 2020-12-11 MED ORDER — EMPAGLIFLOZIN 10 MG PO TABS: 10.0000 mg | ORAL_TABLET | Freq: Every day | ORAL | 3 refills | Status: DC

## 2020-12-11 NOTE — Assessment & Plan Note (Addendum)
Overall very stable period on current regimen of effexor XR '150mg'$  daily and remeron '15mg'$  nightly.  She has been able to largely avoid klonopin which has been changed to 0.'5mg'$  QD PRN anxiety.  Will continue to minimize use due to increased fall risk of medication.

## 2020-12-11 NOTE — Telephone Encounter (Signed)
BP was elevated in office today. Is there a way nursing at [facility] can check BP twice weekly for the next few weeks and call us with on BP control at home?  If staying high would recommend starting BP medication.

## 2020-12-11 NOTE — Assessment & Plan Note (Signed)
A1c stable on latest check.  She notes some impaired fasting hyperglycemia with am sugar levels up to 140-160s.  With CKD, would benefit from SGLT2 - discussed with patient as well as risks to monitor for including UTI, yeast infection, groin cellulitis.  Caution with recent UTI and recent hip replacement - I did send in jardiance '10mg'$  to price out however I have asked her to not start until 01/2021.

## 2020-12-11 NOTE — Assessment & Plan Note (Signed)
Overall stable period.  

## 2020-12-11 NOTE — Assessment & Plan Note (Addendum)
Recovering well after recent hip replacement surgery, did have 2-3 wk stay at [facility]. Also has upcoming ortho appt.

## 2020-12-11 NOTE — Patient Instructions (Addendum)
Price out jardiance diabetes medicine that can help with kidney disease. Watch for UTI, yeast infections and let us know right away if these develop. Start this medicine in October. Let us know if any trouble with this.  Return in 6 months for annual physical/wellness visit  You are doing well after recent hip replacement surgery!

## 2020-12-11 NOTE — Assessment & Plan Note (Signed)
BP elevation noted today. No h/o this.  Forgot to recheck in office. I will see if [place] can check BP twice weekly for 2 wks and send [name] update on BP control at home.

## 2020-12-11 NOTE — Progress Notes (Signed)
Patient ID: DYMOND GUTT, female    DOB: 12/21/1940, 80 y.o.   MRN: [id number]  This visit was conducted in person.  BP (!) 160/90   Pulse 87   Temp 97.8 F (36.6 C) (Temporal)   Ht _0  (1.6 m)   Wt 132 lb 2 oz (59.9 kg)   LMP  (LMP Unknown)   SpO2 97%   BMI 23.40 kg/m   BP Readings from Last 3 Encounters:  12/11/20 (!) 160/90  11/16/20 (!) 157/80  11/06/20 (!) 173/77   CC: rehab f/u visit  Subjective:   HPI: ICYSS SKOG is a 80 y.o. female presenting on 12/11/2020 for Follow-up (Here for rehab f/u.  Pt accompanied by sister-in-law, Betty- temp 97.7.)   Recent R hip replacement ([place]).  Hypokalemia noted while hospitalized, treated with PO replacement. Rpt Cr/K 8/15 - stable.  Discharged on xarelto 73m daily for 3 wk total then plan to transition back to aspirin 823mdaily.  [place] 12[place]04/2020, now back home.   Last visit we changed klonopin to 0.32m32md prn anxiety - she hasn't needed this at all, continues effexor XR 150m93mily and remeron 132mg29mhtly.   She is using quad cane.  Last medicare wellness visit 06/2020.   BP elevated today - not on antihypertensive. Attributes some to increased stress from water leak in apartment, water is being repaired. Prefers not to take BP meds.  DM - not currently on any medication. Checks fasting sugars every morning 140-160. No paresthesias or blurry vision. Denies hypoglycemic symptoms Lab Results  Component Value Date   HGBA1C 6.7 (H) 10/01/2020       Relevant past medical, surgical, family and social history reviewed and updated as indicated. Interim medical history since our last visit reviewed. Allergies and medications reviewed and updated. Outpatient Medications Prior to Visit  Medication Sig Dispense Refill   Accu-Chek FastClix Lancets MISC USE AS INSTRUCTED TO CHECK BLOOD SUGAR ONCE DAILY 102 each 1   ACCU-CHEK GUIDE test strip USE AS INSTRUCTED TO  CHECK BLOOD SUGAR ONCE DAILY 100 strip 1   acetaminophen (TYLENOL) 325 MG tablet Take 650 mg by mouth every 6 (six) hours as needed for moderate pain or headache.     Blood Glucose Monitoring Suppl (ACCU-CHEK GUIDE) w/Device KIT 1 each by Does not apply route as directed. Use as instructed to check blood sugar once daily 1 kit 0   denosumab (PROLIA) 60 MG/ML SOSY injection Inject 60 mg into the skin every 6 (six) months.     ezetimibe (ZETIA) 10 MG tablet TAKE ONE TABLET BY MOUTH ONCE DAILY (Patient taking differently: Take 10 mg by mouth daily.) 30 tablet 8   levothyroxine (SYNTHROID) 50 MCG tablet Take 1 tablet (50 mcg total) by mouth daily. 90 tablet 3   loperamide (IMODIUM A-D) 2 MG tablet Take 2 mg by mouth 4 (four) times daily as needed for diarrhea or loose stools.     mirtazapine (REMERON) 15 MG tablet Take 1 tablet (15 mg total) by mouth at bedtime. 30 tablet 6   nitroGLYCERIN (NITROLINGUAL) 0.4 MG/SPRAY spray Place 1 spray under the tongue every 5 (five) minutes as needed. (Patient taking differently: Place 1 spray under the tongue every 5 (five) minutes as needed for chest pain.) 12 g prn   pravastatin (PRAVACHOL) 80 MG tablet TAKE 1 TABLET BY MOUTH EVERY DAY IN THE EVENING (Patient taking differently: Take 80 mg  by mouth every evening.) 90 tablet 3   venlafaxine XR (EFFEXOR-XR) 150 MG 24 hr capsule Take 1 capsule (150 mg total) by mouth daily with breakfast. 90 capsule 1   clonazePAM (KLONOPIN) 0.5 MG tablet Take 1 tablet (0.5 mg total) by mouth daily as needed for anxiety (sedation precautions). (Patient not taking: Reported on 12/11/2020) 30 tablet 0   HYDROcodone-acetaminophen (NORCO/VICODIN) 5-325 MG tablet Take 1-2 tablets by mouth every 6 (six) hours as needed for moderate pain or severe pain (pain score 4-6). 42 tablet 0   methocarbamol (ROBAXIN) 500 MG tablet Take 1 tablet (500 mg total) by mouth every 6 (six) hours as needed for muscle spasms. 40 tablet 0   No facility-administered  medications prior to visit.     Per HPI unless specifically indicated in ROS section below Review of Systems  Objective:  BP (!) 160/90   Pulse 87   Temp 97.8 F (36.6 C) (Temporal)   Ht _0  (1.6 m)   Wt 132 lb 2 oz (59.9 kg)   LMP  (LMP Unknown)   SpO2 97%   BMI 23.40 kg/m   Wt Readings from Last 3 Encounters:  12/11/20 132 lb 2 oz (59.9 kg)  11/14/20 132 lb (59.9 kg)  11/06/20 132 lb (59.9 kg)      Physical Exam Vitals and nursing note reviewed.  Constitutional:      Appearance: Normal appearance. She is not ill-appearing.     Comments: Ambulates with quad cane  Cardiovascular:     Rate and Rhythm: Normal rate and regular rhythm.     Pulses: Normal pulses.     Heart sounds: Normal heart sounds. No murmur heard. Pulmonary:     Effort: Pulmonary effort is normal. No respiratory distress.     Breath sounds: Normal breath sounds. No wheezing, rhonchi or rales.  Musculoskeletal:     Right lower leg: No edema.     Left lower leg: No edema.  Skin:    General: Skin is warm and dry.     Findings: No rash.  Neurological:     Mental Status: She is alert.     Comments: Slowed gait  Psychiatric:        Mood and Affect: Mood normal.        Behavior: Behavior normal.      Results for orders placed or performed in visit on 50/93/26  Basic metabolic panel  Result Value Ref Range   Glucose 712   Basic metabolic panel  Result Value Ref Range   Creatinine 1.2 (A) 0.5 - 1.1   Potassium 3.9 3.4 - 5.3    Assessment & Plan:  This visit occurred during the SARS-CoV-2 public health emergency.  Safety protocols were in place, including screening questions prior to the visit, additional usage of staff PPE, and extensive cleaning of exam room while observing appropriate contact time as indicated for disinfecting solutions.   Problem List Items Addressed This Visit     MDD (major depressive disorder), recurrent severe, without psychosis ([place])    Overall very stable period on  current regimen of effexor XR 126m daily and remeron 143mnightly.  She has been able to largely avoid klonopin which has been changed to 0.49m77mD PRN anxiety.  Will continue to minimize use due to increased fall risk of medication.       CKD stage 3 due to type 2 diabetes mellitus (HCC)    Overall stable period.       Relevant  Medications   empagliflozin (JARDIANCE) 10 MG TABS tablet   Diabetes mellitus type 2, controlled, with complications (HCC)    P7N stable on latest check.  She notes some impaired fasting hyperglycemia with am sugar levels up to 140-160s.  With CKD, would benefit from SGLT2 - discussed with patient as well as risks to monitor for including UTI, yeast infection, groin cellulitis.  Caution with recent UTI and recent hip replacement - I did send in jardiance 74m to price out however I have asked her to not start until 01/2021.       Relevant Medications   empagliflozin (JARDIANCE) 10 MG TABS tablet   Status post right hip replacement - Primary    Recovering well after recent hip replacement surgery, [facility].       Elevated blood pressure reading without diagnosis of hypertension    BP elevation noted today. No h/o this.  Forgot to recheck in office. [place] for 2 w[place] and send [place] on BP control at home.         Meds ordered this encounter  Medications   empagliflozin (JARDIANCE) 10 MG TABS tablet    Sig: Take 1 tablet (10 mg total) by mouth daily before breakfast.    Dispense:  30 tablet    Refill:  3    No orders of the defined types were placed in this encounter.    Patient Instructions  Price out jardiance diabetes medicine that can help with kidney disease. Watch for UTI, yeast infections and let [place]now right away if these develop. Start this medicine in October. Let [place] if any trouble with this.  Return in 6 months for annual  physical/wellness visit  You are doing well after recent hip replacement surgery!   Follow up plan: Return in about 6 months (around 06/11/2021) for annual exam, prior fasting for blood work, medicare wellness visit.  JRia Bush MD

## 2020-12-12 NOTE — Telephone Encounter (Signed)
Called [facility] and left message for Nurse with independent living for this patient and see if they can check b/p as requested. Waiting for call back

## 2020-12-12 NOTE — Telephone Encounter (Signed)
Rx written and in Lisa's box.  

## 2020-12-12 NOTE — Telephone Encounter (Signed)
Spoke with [name] at [facility] and yes they can check b/p for the patient and then let us know the readings. They do need this order to be faxed to them please at [phone] Attn: [name] and Merlene Morse.

## 2020-12-13 DIAGNOSIS — F419 Anxiety disorder, unspecified: Secondary | ICD-10-CM | POA: Diagnosis not present

## 2020-12-13 DIAGNOSIS — N183 Chronic kidney disease, stage 3 unspecified: Secondary | ICD-10-CM | POA: Diagnosis not present

## 2020-12-13 DIAGNOSIS — R278 Other lack of coordination: Secondary | ICD-10-CM | POA: Diagnosis not present

## 2020-12-13 DIAGNOSIS — E0800 Diabetes mellitus due to underlying condition with hyperosmolarity without nonketotic hyperglycemic-hyperosmolar coma (NKHHC): Secondary | ICD-10-CM | POA: Diagnosis not present

## 2020-12-13 DIAGNOSIS — Z4789 Encounter for other orthopedic aftercare: Secondary | ICD-10-CM | POA: Diagnosis not present

## 2020-12-13 DIAGNOSIS — I2510 Atherosclerotic heart disease of native coronary artery without angina pectoris: Secondary | ICD-10-CM | POA: Diagnosis not present

## 2020-12-13 DIAGNOSIS — F329 Major depressive disorder, single episode, unspecified: Secondary | ICD-10-CM | POA: Diagnosis not present

## 2020-12-13 DIAGNOSIS — R2689 Other abnormalities of gait and mobility: Secondary | ICD-10-CM | POA: Diagnosis not present

## 2020-12-13 DIAGNOSIS — Z96641 Presence of right artificial hip joint: Secondary | ICD-10-CM | POA: Diagnosis not present

## 2020-12-13 DIAGNOSIS — M6281 Muscle weakness (generalized): Secondary | ICD-10-CM | POA: Diagnosis not present

## 2020-12-13 DIAGNOSIS — Z471 Aftercare following joint replacement surgery: Secondary | ICD-10-CM | POA: Diagnosis not present

## 2020-12-13 NOTE — Telephone Encounter (Signed)
Faxed order

## 2020-12-14 ENCOUNTER — Telehealth: Payer: Self-pay

## 2020-12-14 NOTE — Telephone Encounter (Signed)
Notified patient of Jardiance 10 mg cost - $81.47 for 30 day supply. However, this will also put her in the coverage gap which may increase the cost of her other medications. Per PCP note, wait until October 2022 to start this. Patient states she does not want start BP medication. Discussed indication for diabetes, CKD and she was willing to start but will need cost assistance. Her income is very near cut off for Jardiance (250% FPL) so we may have more success applying for Farxiga (300% FPL).   Dr. Darnell Level, ok for [name] to switch to Farxiga 5 mg and apply for assistance? If so please change rx in chart accordingly and no print needed.  Debbora Dus, PharmD Clinical Pharmacist [facility] at [facility] [phone]

## 2020-12-18 DIAGNOSIS — Z96641 Presence of right artificial hip joint: Secondary | ICD-10-CM | POA: Diagnosis not present

## 2020-12-18 DIAGNOSIS — Z471 Aftercare following joint replacement surgery: Secondary | ICD-10-CM | POA: Diagnosis not present

## 2020-12-18 MED ORDER — DAPAGLIFLOZIN PROPANEDIOL 5 MG PO TABS: 5.0000 mg | ORAL_TABLET | Freq: Every day | ORAL | 6 refills | Status: DC

## 2020-12-18 NOTE — Telephone Encounter (Signed)
Ok to do [facility] Rx placed in chart.

## 2020-12-21 ENCOUNTER — Telehealth: Payer: Self-pay

## 2020-12-21 NOTE — Chronic Care Management (AMB) (Addendum)
Chronic Care Management Pharmacy Assistant   Name: Kathleen Williamson  MRN: [id number] DOB: October 03, 1940   Reason for Encounter: Medication Adherence and Delivery coordination   Recent office visits:  12/11/20-PCP-Patient presented for follow up after right hip replacement. Price out jardiance diabetes medicine that can help with kidney disease.I have asked her to not start until 01/2021. She has been able to largely avoid klonopin which has been changed to 0.13m QD PRN anxiety.   Recent consult visits:  None since last CCM contact  Hospital visits:  11/14/20 thru 11/16/20-[facility]-Patient presented for Right hip arthroplasty-discharged to SNF,discharged with Xarelto 132mtake 1 tablet daily for 19 days the start 8168mspirin once daily,Robaxin 500m62mke 1 tablet every 6 hours as needed for muscle spasm,hydrocodone 5/325mg15me 1-2 tablets every 6 hours as needed for pain  Medications: Outpatient Encounter Medications as of 12/21/2020  Medication Sig   Accu-Chek FastClix Lancets MISC USE AS INSTRUCTED TO CHECK BLOOD SUGAR ONCE DAILY   ACCU-CHEK GUIDE test strip USE AS INSTRUCTED TO CHECK BLOOD SUGAR ONCE DAILY   acetaminophen (TYLENOL) 325 MG tablet Take 650 mg by mouth every 6 (six) hours as needed for moderate pain or headache.   Blood Glucose Monitoring Suppl (ACCU-CHEK GUIDE) w/Device KIT 1 each by Does not apply route as directed. Use as instructed to check blood sugar once daily   clonazePAM (KLONOPIN) 0.5 MG tablet Take 1 tablet (0.5 mg total) by mouth daily as needed for anxiety (sedation precautions). (Patient not taking: Reported on 12/11/2020)   dapagliflozin propanediol (FARXIGA) 5 MG TABS tablet Take 1 tablet (5 mg total) by mouth daily before breakfast.   denosumab (PROLIA) 60 MG/ML SOSY injection Inject 60 mg into the skin every 6 (six) months.   ezetimibe (ZETIA) 10 MG tablet TAKE ONE TABLET BY MOUTH ONCE DAILY (Patient taking differently: Take 10 mg by mouth daily.)    levothyroxine (SYNTHROID) 50 MCG tablet Take 1 tablet (50 mcg total) by mouth daily.   loperamide (IMODIUM A-D) 2 MG tablet Take 2 mg by mouth 4 (four) times daily as needed for diarrhea or loose stools.   mirtazapine (REMERON) 15 MG tablet Take 1 tablet (15 mg total) by mouth at bedtime.   nitroGLYCERIN (NITROLINGUAL) 0.4 MG/SPRAY spray Place 1 spray under the tongue every 5 (five) minutes as needed. (Patient taking differently: Place 1 spray under the tongue every 5 (five) minutes as needed for chest pain.)   pravastatin (PRAVACHOL) 80 MG tablet TAKE 1 TABLET BY MOUTH EVERY DAY IN THE EVENING (Patient taking differently: Take 80 mg by mouth every evening.)   venlafaxine XR (EFFEXOR-XR) 150 MG 24 hr capsule Take 1 capsule (150 mg total) by mouth daily with breakfast.   No facility-administered encounter medications on file as of 12/21/2020.    BP Readings from Last 3 Encounters:  12/11/20 (!) 160/90  11/16/20 (!) 157/80  11/06/20 (!) 173/77    Lab Results  Component Value Date   HGBA1C 6.7 (H) 10/01/2020    New medication - JardiTheodis Satophone note regarding plan.  Last adherence delivery date: 11/21/20 - pt got behind on pill packs due to hospitalization/rehab.  Patient is due for next adherence delivery on: 12/26/20  Spoke with patient on 12/21/20 reviewed medications and coordinated delivery.  This delivery to include: Adherence Packaging  30 Days  Packs: Ezetimibe 10 mg - take 1 tablet breakfast Mirtazapine 15 mg - take 1 tablet bedtime Levothyroxine 50 mcg - take 1  tablet breakfast Venlafaxine XR 150 mg - take 1 tablet breakfast OTC Aspirin 81 mg - take 1 tablet breakfast Pravastatin 80 mg - take 1 tablet bedtime  VIAL medications: None needed  Patient declined the following medications this month: OTC CoQ10 mg - 1 tablet daily (30 day supply on hand)  Any concerns about your medications? No  How often do you forget or accidentally miss a dose? The patient  reports she is not missing doses as she is home from surgery on her hip and she feels wonderful and pain free.  Is patient in packaging Yes  If yes  What is the date on your next pill pack? 11/20/20 Dates on last packs: 10/25/20 - 11/23/20  Any concerns or issues with your packaging? no  No refill request needed. Need to transfer and back out [facility] from [facility].  Confirmed delivery date of 12/26/20, advised patient that pharmacy will contact them the morning of delivery.  Recent blood pressure readings are as follows: 12/12/20  138/76  77-P  (Home RN comes 3 days week, M,W,F)  Recent blood glucose readings are as follows: Fasting:  12/20/20  153  She also had physical therapy coming on M,W,F ---> 1- 2 PM  Annual wellness visit in last year? Yes 06/18/20 Most Recent BP reading: 160/90  87-P  12/11/20  If Diabetic: Most recent A1C reading: 6.7% 10/01/20 Last eye exam / retinopathy screening: up to date per chart Last diabetic foot exam:  09/27/2018 per chart  Debbora Dus, CPP notified  Avel Sensor, [place] Assistant [phone]  I have reviewed the care management and care coordination activities outlined in this encounter and I am certifying that I agree with the content of this note. Contacted patient to review meds as she told me at beginning of Sept she had 1 month remaining. Her meds will run out 12/25/20 (take last dose this day). She also needs [place] assistance, scheduled in person visit to home since she is unable to drive at this time.   Debbora Dus, PharmD Clinical Pharmacist [place] [facility] at [facility] [phone]

## 2020-12-25 ENCOUNTER — Telehealth: Payer: Self-pay

## 2020-12-25 NOTE — Progress Notes (Addendum)
Patient assistance forms for Farxiga 5 mg  filled out and sent to [employer]. Patient has home visit scheduled for 9/20 to complete forms.  Debbora Dus, CPP notified  Avel Sensor, [place] Assistant [phone]  I have reviewed the care management and care coordination activities outlined in this encounter and I am certifying that I agree with the content of this note. No further action required.  Debbora Dus, PharmD Clinical Pharmacist [place] [facility] at [facility] [phone]

## 2020-12-25 NOTE — Telephone Encounter (Signed)
OT called and asked for clarification on patient's medications. States patient is out of venlafaxine. Will need delivery tomorrow. Delivery already scheduled for tomorrow AM.  Debbora Dus, PharmD Clinical Pharmacist [place] [facility] at [facility] [phone]

## 2020-12-27 ENCOUNTER — Telehealth: Payer: Self-pay | Admitting: Family Medicine

## 2020-12-27 DIAGNOSIS — R413 Other amnesia: Secondary | ICD-10-CM

## 2020-12-27 NOTE — Telephone Encounter (Signed)
Porsha from [facility] called requesting status on TO [employer] [place] . Would like a call back #[phone]

## 2020-12-29 NOTE — Telephone Encounter (Addendum)
Faxed form on 12/28/20.  I'll confirm it was received with [facility].

## 2020-12-29 NOTE — Telephone Encounter (Signed)
I signed this I believe on Wednesday and placed in my out box.  Don't know if it was ever faxed back or if still in my out box.  Lattie Haw plz call [facility] to ensure they received, if not check with front office.

## 2020-12-31 NOTE — Telephone Encounter (Signed)
Lvm asking Kathleen Williamson to call back.  Need to confirm she received the faxed form for pt.

## 2021-01-01 ENCOUNTER — Ambulatory Visit: Payer: PPO

## 2021-01-01 NOTE — Telephone Encounter (Signed)
Left another message on voicemail for Kathleen Williamson to call the office back.

## 2021-01-01 NOTE — Telephone Encounter (Signed)
Porsha called in. She states they have received the fax.

## 2021-01-02 NOTE — Telephone Encounter (Signed)
Noted  

## 2021-01-07 NOTE — Telephone Encounter (Signed)
Ok to proceed with prolia injection using 11/26/2020 labs. Thanks.

## 2021-01-07 NOTE — Telephone Encounter (Signed)
OOP cost is $295. LOV 12/11/20 for post right hip replacement/rehab.   Dr Darnell Level, is patient ok to proceed with Prolia injection after this surgery or need to post pone? If ok to proceed, can recent labs noted below be used for the injection or need to re draw?  Last labs done on 11/26/20 CrCl 36.56 mL/min. Calcium was normal 9.0

## 2021-01-08 NOTE — Telephone Encounter (Signed)
Called home number x 3, kept been hung up on, called cell number on file but the name on voicemail is not mentioned on patient's DPR or emergency contact list. Called [employer] and asked to have patient call us

## 2021-01-09 ENCOUNTER — Telehealth: Payer: Self-pay

## 2021-01-09 NOTE — Telephone Encounter (Signed)
Patient approved through [employer] for Farxiga at no cost through 04/13/21. A prescription is needed. Please fax Farxiga prescription to [phone].  Thank you,  Debbora Dus, PharmD Clinical Pharmacist [place] [facility] at [facility] [phone]

## 2021-01-09 NOTE — Telephone Encounter (Signed)
Dr. Darnell Level, do you want me to print rx?  If so, how many refills?

## 2021-01-09 NOTE — Chronic Care Management (AMB) (Addendum)
Chronic Care Management Pharmacy Assistant   Name: Kathleen Williamson  MRN: [id number] DOB: 08-Jan-1941   Reason for Encounter: Medication Adherence and Delivery Coordination  Recent office visits:  None since last CCM contact  Recent consult visits:  None since last CCM contact  Hospital visits:  None in previous 6 months  Medications: Outpatient Encounter Medications as of 01/09/2021  Medication Sig   Accu-Chek FastClix Lancets MISC USE AS INSTRUCTED TO CHECK BLOOD SUGAR ONCE DAILY   ACCU-CHEK GUIDE test strip USE AS INSTRUCTED TO CHECK BLOOD SUGAR ONCE DAILY   acetaminophen (TYLENOL) 325 MG tablet Take 650 mg by mouth every 6 (six) hours as needed for moderate pain or headache.   Blood Glucose Monitoring Suppl (ACCU-CHEK GUIDE) w/Device KIT 1 each by Does not apply route as directed. Use as instructed to check blood sugar once daily   clonazePAM (KLONOPIN) 0.5 MG tablet Take 1 tablet (0.5 mg total) by mouth daily as needed for anxiety (sedation precautions). (Patient not taking: Reported on 12/11/2020)   dapagliflozin propanediol (FARXIGA) 5 MG TABS tablet Take 1 tablet (5 mg total) by mouth daily before breakfast.   denosumab (PROLIA) 60 MG/ML SOSY injection Inject 60 mg into the skin every 6 (six) months.   ezetimibe (ZETIA) 10 MG tablet TAKE ONE TABLET BY MOUTH ONCE DAILY (Patient taking differently: Take 10 mg by mouth daily.)   levothyroxine (SYNTHROID) 50 MCG tablet Take 1 tablet (50 mcg total) by mouth daily.   loperamide (IMODIUM A-D) 2 MG tablet Take 2 mg by mouth 4 (four) times daily as needed for diarrhea or loose stools.   mirtazapine (REMERON) 15 MG tablet Take 1 tablet (15 mg total) by mouth at bedtime.   nitroGLYCERIN (NITROLINGUAL) 0.4 MG/SPRAY spray Place 1 spray under the tongue every 5 (five) minutes as needed. (Patient taking differently: Place 1 spray under the tongue every 5 (five) minutes as needed for chest pain.)   pravastatin (PRAVACHOL) 80 MG tablet TAKE 1  TABLET BY MOUTH EVERY DAY IN THE EVENING (Patient taking differently: Take 80 mg by mouth every evening.)   venlafaxine XR (EFFEXOR-XR) 150 MG 24 hr capsule Take 1 capsule (150 mg total) by mouth daily with breakfast.   No facility-administered encounter medications on file as of 01/09/2021.    BP Readings from Last 3 Encounters:  12/11/20 (!) 160/90  11/16/20 (!) 157/80  11/06/20 (!) 173/77    Lab Results  Component Value Date   HGBA1C 6.7 (H) 10/01/2020      No OVs, Consults, or hospital visits since last care coordination call / Pharmacist visit. No medication changes indicated   Last adherence delivery date:12/26/20      Patient is due for next adherence delivery on: 01/21/21  Spoke with patient on 01/11/21 reviewed medications and coordinated delivery.  This delivery to include: Adherence Packaging  30 Days  Packs: Ezetimibe 10 mg - take 1 tablet breakfast Mirtazapine 15 mg - take 1 tablet bedtime Levothyroxine 50 mcg - take 1 tablet breakfast Venlafaxine XR 150 mg - take 1 tablet breakfast OTC Aspirin 81 mg - take 1 tablet breakfast Pravastatin 80 mg - take 1 tablet bedtime  VIAL medications: none  Patient declined the following medications this month: Farxiga 71m take one tablet before breakfast (getting PAP) Jardiance 172m changing therapy to FaIranTC coQ1065mtake 1 tablet daily - patient declined   Any concerns about your medications? No  Informed the patient she was approved for patient assistance for Far[place]  and should get delivery.  How often do you forget or accidentally miss a dose? Never  Is patient in packaging Yes  If yes  What is the date on your next pill pack? 01/11/21   No refill request needed.  Confirmed delivery date of 01/21/21, advised patient that pharmacy will contact them the morning of delivery.  Recent blood pressure readings are as follows:  The patient reports a home health nurse comes couple days a week and she will write this  down next time and keep some readings.  Annual wellness visit in last year? Yes  06/18/20 Most Recent BP reading:160/90   87-P  12/11/20  If Diabetic: Most recent A1C reading: 6.7  10/01/20 Last eye exam / retinopathy screening: pt due but has not scheduled Last diabetic foot exam: 09/27/2018  Debbora Dus, CPP notified  Avel Sensor, [place] Assistant [phone]  I have reviewed the care management and care coordination activities outlined in this encounter and I am certifying that I agree with the content of this note. No further action required.  Debbora Dus, PharmD Clinical Pharmacist [place] [facility] at [facility] [phone]

## 2021-01-11 MED ORDER — DAPAGLIFLOZIN PROPANEDIOL 5 MG PO TABS: 5.0000 mg | ORAL_TABLET | Freq: Every day | ORAL | 3 refills | Status: DC

## 2021-01-11 NOTE — Addendum Note (Signed)
Addended by: [name] on: 01/11/2021 07:10 AM   Modules accepted: Orders

## 2021-01-11 NOTE — Telephone Encounter (Signed)
Left message on other phone numbers in the chart, home number I call I keep been hung up on and no one speaks.

## 2021-01-11 NOTE — Telephone Encounter (Signed)
Rx printed and in Lisa's box.

## 2021-01-11 NOTE — Telephone Encounter (Signed)
Faxed rx to [place] at fax # below.

## 2021-01-14 NOTE — Progress Notes (Signed)
    Chronic Care Management Pharmacy Assistant   Name: LUVERTA KORTE  MRN: [id number] DOB: 1940/06/14  Reason for Encounter: [facility] PAP Update - Pharmacy   Spoke with [place]  for [facility] & Me) can be faxed to: [phone] (takes 2-5 business days for processing) Prescription for [facility] & Me) can also be verbally given to [facility] by calling [phone] (takes up to 48 hours processing). Calling [facility] is the fastest way per representative.   Debbora Dus, CPP notified  Marijean Niemann, [place] Clinical Pharmacy Assistant [phone]

## 2021-01-14 NOTE — Telephone Encounter (Signed)
Patient made aware of approval. As of today (01/14/21) program has not received rx.

## 2021-01-15 ENCOUNTER — Other Ambulatory Visit: Payer: Self-pay | Admitting: Cardiology

## 2021-01-16 DIAGNOSIS — R413 Other amnesia: Secondary | ICD-10-CM | POA: Insufficient documentation

## 2021-01-16 NOTE — Telephone Encounter (Signed)
Received notice that patient refused driving evaluation.  SLUMS and MOCA scores of 13 at [facility].  They have sent letter to [facility] requesting medical evaluation.

## 2021-01-18 NOTE — Telephone Encounter (Signed)
Contacted pharmacy and provided verbal order for Farxiga 5 mg #90 with 3 refills. They can now begin shipment process.  Debbora Dus, PharmD Clinical Pharmacist [place] [facility] at [facility] [phone]

## 2021-01-30 NOTE — Telephone Encounter (Signed)
Dr Darnell Level, I wanted to let you know that I have called patient several times but my call keeps been answered and hung up. I called both numbers in the chart, I called her sister in law, and I mailed her a letter asking her to call to be scheduled. Have not been able to get in touch with anyone.

## 2021-02-06 ENCOUNTER — Other Ambulatory Visit: Payer: Self-pay | Admitting: Cardiology

## 2021-02-07 ENCOUNTER — Telehealth: Payer: Self-pay

## 2021-02-07 NOTE — Chronic Care Management (AMB) (Addendum)
Chronic Care Management Pharmacy Assistant   Name: Kathleen Williamson  MRN: [id number] DOB: 03-26-1941   Reason for Encounter: Medication Adherence and Delivery Coordination  Recent office visits:  None since last CCM contact  Recent consult visits:  None since last CCM contact  Hospital visits:  None in previous 6 months  Medications: Outpatient Encounter Medications as of 02/07/2021  Medication Sig   Accu-Chek FastClix Lancets MISC USE AS INSTRUCTED TO CHECK BLOOD SUGAR ONCE DAILY   ACCU-CHEK GUIDE test strip USE AS INSTRUCTED TO CHECK BLOOD SUGAR ONCE DAILY   acetaminophen (TYLENOL) 325 MG tablet Take 650 mg by mouth every 6 (six) hours as needed for moderate pain or headache.   Blood Glucose Monitoring Suppl (ACCU-CHEK GUIDE) w/Device KIT 1 each by Does not apply route as directed. Use as instructed to check blood sugar once daily   clonazePAM (KLONOPIN) 0.5 MG tablet Take 1 tablet (0.5 mg total) by mouth daily as needed for anxiety (sedation precautions). (Patient not taking: Reported on 12/11/2020)   dapagliflozin propanediol (FARXIGA) 5 MG TABS tablet Take 1 tablet (5 mg total) by mouth daily before breakfast.   denosumab (PROLIA) 60 MG/ML SOSY injection Inject 60 mg into the skin every 6 (six) months.   ezetimibe (ZETIA) 10 MG tablet TAKE ONE TABLET BY MOUTH ONCE DAILY (Patient taking differently: Take 10 mg by mouth daily.)   levothyroxine (SYNTHROID) 50 MCG tablet Take 1 tablet (50 mcg total) by mouth daily.   loperamide (IMODIUM A-D) 2 MG tablet Take 2 mg by mouth 4 (four) times daily as needed for diarrhea or loose stools.   mirtazapine (REMERON) 15 MG tablet Take 1 tablet (15 mg total) by mouth at bedtime.   nitroGLYCERIN (NITROLINGUAL) 0.4 MG/SPRAY spray Place 1 spray under the tongue every 5 (five) minutes as needed. (Patient taking differently: Place 1 spray under the tongue every 5 (five) minutes as needed for chest pain.)   pravastatin (PRAVACHOL) 80 MG tablet TAKE  ONE TABLET BY MOUTH EVERYDAY AT BEDTIME   venlafaxine XR (EFFEXOR-XR) 150 MG 24 hr capsule Take 1 capsule (150 mg total) by mouth daily with breakfast.   No facility-administered encounter medications on file as of 02/07/2021.    BP Readings from Last 3 Encounters:  12/11/20 (!) 160/90  11/16/20 (!) 157/80  11/06/20 (!) 173/77    Lab Results  Component Value Date   HGBA1C 6.7 (H) 10/01/2020     No OVs, Consults, or hospital visits since last care coordination call / Pharmacist visit. No medication changes indicated   Last adherence delivery date:01/21/21      Patient is due for next adherence delivery on: 02/19/21  Spoke with patient on 02/08/21 reviewed medications and coordinated delivery.  This delivery to include: Adherence Packaging  30 Days  Packs: Ezetimibe 10 mg - take 1 tablet breakfast Mirtazapine 15 mg - take 1 tablet bedtime Levothyroxine 50 mcg - take 1 tablet breakfast Venlafaxine XR 150 mg - take 1 tablet breakfast OTC Aspirin 81 mg - take 1 tablet breakfast Pravastatin 80 mg - take 1 tablet bedtime OTC coQ28m- take 1 tablet bedtime  VIAL medications: none  Patient declined the following medications this month: Farxiga 554mtake one tablet before breakfast (getting from patient assistance program)  Any concerns about your medications? No  How often do you forget or accidentally miss a dose? Never  Is patient in packaging Yes  What is the date on your next pill pack? 02/08/21  Any concerns  or issues with your packaging?  Happy with process and accessibility   Refills requested from providers include: pravastatin   Confirmed delivery date of 02/19/21 , advised patient that pharmacy will contact them the morning of delivery.  Recent blood pressure readings are as follows: none available and the RN staff no longer comes to home   Recent blood glucose readings are as follows: Fasting:  02/08/21-152      fasting has been running:  [phone]  Annual wellness visit in last year? Yes Most Recent BP reading: 160/90  87-P  12/11/20  If Diabetic: Most recent A1C reading:6.7  10/01/20 Last eye exam / retinopathy screening: overdue-patient reminded of importance to get scheduled Last diabetic foot exam: 09/2018, overdue   Debbora Dus, CPP notified  Avel Sensor, [place] Assistant [phone]  I have reviewed the care management and care coordination activities outlined in this encounter and I am certifying that I agree with the content of this note. Received notification that Farxiga 5 mg refill has been shipped from manufacturer today.  Debbora Dus, PharmD Clinical Pharmacist [place] [facility] at [facility] [phone]

## 2021-02-12 ENCOUNTER — Telehealth: Payer: Self-pay | Admitting: Family Medicine

## 2021-02-12 NOTE — Telephone Encounter (Signed)
Brandy from [facility] called about pt being hypertensive and she is asymptomatic and need recommendations for medication

## 2021-02-14 NOTE — Telephone Encounter (Signed)
Spoke with Theadora Rama, NP with [facility], to get clarification of request.  States she saw notes from pt's 12/11/20 OV that BP was elevated.  [facility] was to check BP twice wkly for 2 wks.  Theadora Rama states she has seen pt twice and at both visits BP was elevated:  9/16- BP 146/78, then 11/1- BP 162/78.  She is thing pt needs ace inhibitor but of course wants recommendation from Dr. Darnell Level.  Plz advise.

## 2021-02-15 MED ORDER — AMLODIPINE BESYLATE 2.5 MG PO TABS: 2.5000 mg | ORAL_TABLET | Freq: Every day | ORAL | 1 refills | Status: DC

## 2021-02-15 NOTE — Addendum Note (Signed)
Addended by: [name] on: 02/15/2021 01:49 PM   Modules accepted: Orders

## 2021-02-15 NOTE — Telephone Encounter (Addendum)
Spoke with Theadora Rama asking if pt is taking Farxiga 5 mg daily.  She confirms pt is.  I relayed Dr. Darnell Level' message.  Brandy verbalizes understanding and she will inform pt.

## 2021-02-15 NOTE — Telephone Encounter (Addendum)
Is she taking farxiga 5mg  daily?  I'd like to start her on amlodipine 2.5mg  daily - sent to pharmacy.  Please have them check BP twice weekly and call me in 1-2 wks with update.  Please notify patient and Theadora Rama NP

## 2021-02-19 NOTE — Telephone Encounter (Signed)
Brandy with [facility] calling back and [facility] was on pts med list but Theadora Rama said that pt states she is not taking [place]. Pt was going to start Amlodipine on 02/15/21 or 02/16/21. Brandy request cb to verify if [facility] has been notified to ck BP twice a wk for pt. Sending note to [facility] and to Dr Darnell Level due to pt confirming she is not taking [place].

## 2021-02-19 NOTE — Telephone Encounter (Signed)
Lvm with independent living nurse desk at [facility] asking her to call back.  Need to inform her Dr. Darnell Level is asking if they can check pt's BP twice a wk for 2 wks, then call with log of readings.  Also, pt is to take amlodipine daily.

## 2021-02-19 NOTE — Telephone Encounter (Addendum)
I have not - Kathleen Williamson please inform them if not already done.   Still recommend take amlodipine and call us back with BP readings.

## 2021-02-20 NOTE — Telephone Encounter (Signed)
Faxed requested info to [place] at [facility].

## 2021-02-20 NOTE — Telephone Encounter (Signed)
[name] with [facility] is asking if you could fax the below note to [phone]

## 2021-02-26 NOTE — Telephone Encounter (Signed)
Called patient and was able to leave a message this time to call me back to discuss

## 2021-03-06 ENCOUNTER — Other Ambulatory Visit: Payer: Self-pay | Admitting: Cardiology

## 2021-03-11 ENCOUNTER — Telehealth: Payer: Self-pay

## 2021-03-11 NOTE — Progress Notes (Addendum)
Chronic Care Management Pharmacy Assistant   Name: Kathleen Williamson  MRN: [id number] DOB: 1941-03-31  Reason for Encounter: CCM (Medication Adherence and Delivery Coordination)   Recent office visits:  02/12/2021 - [name], MD - Telephone - Start: amLODipine [facility]) 2.5 MG tablet  Recent consult visits:  None since last CCM contact  Hospital visits:  None since last CCM contact  Medications: Outpatient Encounter Medications as of 03/11/2021  Medication Sig   Accu-Chek FastClix Lancets MISC USE AS INSTRUCTED TO CHECK BLOOD SUGAR ONCE DAILY   ACCU-CHEK GUIDE test strip USE AS INSTRUCTED TO CHECK BLOOD SUGAR ONCE DAILY   acetaminophen (TYLENOL) 325 MG tablet Take 650 mg by mouth every 6 (six) hours as needed for moderate pain or headache.   amLODipine (NORVASC) 2.5 MG tablet Take 1 tablet (2.5 mg total) by mouth daily.   Blood Glucose Monitoring Suppl (ACCU-CHEK GUIDE) w/Device KIT 1 each by Does not apply route as directed. Use as instructed to check blood sugar once daily   clonazePAM (KLONOPIN) 0.5 MG tablet Take 1 tablet (0.5 mg total) by mouth daily as needed for anxiety (sedation precautions). (Patient not taking: Reported on 12/11/2020)   dapagliflozin propanediol (FARXIGA) 5 MG TABS tablet Take 1 tablet (5 mg total) by mouth daily before breakfast.   denosumab (PROLIA) 60 MG/ML SOSY injection Inject 60 mg into the skin every 6 (six) months.   ezetimibe (ZETIA) 10 MG tablet TAKE ONE TABLET BY MOUTH ONCE DAILY (Patient taking differently: Take 10 mg by mouth daily.)   levothyroxine (SYNTHROID) 50 MCG tablet Take 1 tablet (50 mcg total) by mouth daily.   loperamide (IMODIUM A-D) 2 MG tablet Take 2 mg by mouth 4 (four) times daily as needed for diarrhea or loose stools.   mirtazapine (REMERON) 15 MG tablet Take 1 tablet (15 mg total) by mouth at bedtime.   nitroGLYCERIN (NITROLINGUAL) 0.4 MG/SPRAY spray Place 1 spray under the tongue every 5 (five) minutes as needed.  (Patient taking differently: Place 1 spray under the tongue every 5 (five) minutes as needed for chest pain.)   pravastatin (PRAVACHOL) 80 MG tablet TAKE ONE TABLET BY MOUTH EVERYDAY AT BEDTIME   venlafaxine XR (EFFEXOR-XR) 150 MG 24 hr capsule Take 1 capsule (150 mg total) by mouth daily with breakfast.   No facility-administered encounter medications on file as of 03/11/2021.   BP Readings from Last 3 Encounters:  12/11/20 (!) 160/90  11/16/20 (!) 157/80  11/06/20 (!) 173/77    Lab Results  Component Value Date   HGBA1C 6.7 (H) 10/01/2020    No OVs, Consults, or hospital visits since last care coordination call / Pharmacist visit. Recent medication changes indicated:  Start: amLODipine (NORVASC) 2.5 MG tablet  Last adherence delivery date: 02/19/2021  Patient is due for next adherence delivery on:  03/20/2021  Spoke with patient on 03/12/2021 reviewed medications and coordinated delivery.  This delivery to include: Adherence Packaging  30 Days  Packs: Co Q10 100 mg- take 1 tablet bedtime Venlafaxine XR 150 mg - take 1 tablet breakfast Mirtazapine 15 mg - take 1 tablet bedtime Pravastatin 80 mg - take 1 tablet bedtime Levothyroxine 50 mcg - take 1 tablet breakfast Aspirin 81 mg - take 1 tablet breakfast Ezetimibe 10 mg - take 1 tablet breakfast Amlodipine 2.5 mg - take 1 tablet by mouth daily breakfast   VIAL medications: none   Patient declined the following medications this month: Farxiga 60m take one tablet before breakfast (getting from  patient assistance program) - No longer taking. Patient would like for [name] to send Dr. Danise Mina a note that she is not taking due to research she did. She does not feel comfortable taking it.   Any concerns about your medications? Yes -See above about Wilder Glade   How often do you forget or accidentally miss a dose? Never  Do you use a pillbox? No  Is patient in packaging Yes  If yes  What is the date on your next pill pack?  03/12/2021 Bedtime  Any concerns or issues with your packaging? No  Refills requested from providers include: Pravastatin 80 mg - take 1 tablet bedtime  Confirmed delivery date of 03/20/2021, advised patient that pharmacy will contact them the morning of delivery.  Recent blood pressure readings are as follows: Patient stated she has a nurse that comes and the nurse takes the readings. Patient did not have a log of the readings.   Recent blood glucose readings are as follows:  11/30 - 149 11/29 - 202 Non-Fasting 11/24 - 154 Fasting 11/14 - 197 Fasting - Went to family function the day before and ate things she usually doesn't eat.   Annual wellness visit in last year? Yes 06/18/2020 Most Recent BP reading: 160/90 on 12/11/2020  If Diabetic: Most recent A1C reading: 6.7 on 10/01/2020 Last eye exam / retinopathy screening: Up to date Last diabetic foot exam: 06/03/2018  Debbora Dus, CPP notified  Marijean Niemann, [place] Assistant [phone]  I have reviewed the care management and care coordination activities outlined in this encounter and I am certifying that I agree with the content of this note. Will update chart regarding Farxiga.  Debbora Dus, PharmD Clinical Pharmacist [facility] at [facility] [phone]

## 2021-03-13 ENCOUNTER — Telehealth: Payer: Self-pay

## 2021-03-13 NOTE — Telephone Encounter (Signed)
Lvm asking [name] to call back.  Need to relay Dr. Synthia Innocent message.

## 2021-03-13 NOTE — Telephone Encounter (Signed)
Patient reported to [facility] team she stopped [place] after reading information about it. She was receiving the medication at no cost from the manufacturer. She provided the following BG log: Recent blood glucose readings are as follows:  11/30 - 149 11/29 - 202 Non-Fasting 11/24 - 154 Fasting 11/14 - 197 Fasting - Went to family function the day before and ate things she usually doesn't eat.   Will contact patient to discuss her concerns.  Debbora Dus, PharmD Clinical Pharmacist Practitioner [facility] at [facility] [phone]

## 2021-03-13 NOTE — Telephone Encounter (Addendum)
Received log of BP at [facility] - 120-144/60s-80, HR 74-95.  Overall stable readings, no med changes indicated at this time.  Continue amlodipine 2.5mg  daily.  May stop checking BP.  Contact is Kathleen Williamson CMA [phone] at [place] notify Kathleen Williamson been trying to reach patient unsuccessfully to get prolia injection scheduled as she's overdue (was due last month). What's the best way to contact patient?   BP Readings from Last 3 Encounters:  12/11/20 (!) 160/90  11/16/20 (!) 157/80  11/06/20 (!) 173/77

## 2021-03-14 NOTE — Telephone Encounter (Signed)
[name] returned your call . Wanted to leave her Fax # [phone] if you need to fax new orders

## 2021-03-14 NOTE — Telephone Encounter (Signed)
Spoke with [facility] relaying Dr. Synthia Innocent message. She verbalizes understanding and will inform pt.  Also, provided the same phn # [phone] that we have in pt's chart.  [name] says we can call her back if we still can't reach pt.  Attempted to contact pt to schedule NV for Prolia.  No answer.  No vm.   Called [name] to see if she can have pt call.  Says she will try to contact pt (even if she has to walk over to her) and have pt call back to schedule NV for Prolia inj.

## 2021-03-14 NOTE — Telephone Encounter (Signed)
Lvm asking [name] to call back.  Need to relay Dr. Synthia Innocent message.

## 2021-03-14 NOTE — Telephone Encounter (Signed)
Multiple attempts to reach pt to discuss her concerns about [place]. Would like to discuss risks/benefits. Please have patient return call.  Debbora Dus, PharmD Clinical Pharmacist Practitioner [facility] at [facility] [phone]

## 2021-03-14 NOTE — Telephone Encounter (Signed)
Kathleen Williamson from [facility] returned your call . Would like a call back #[phone]

## 2021-03-15 ENCOUNTER — Telehealth: Payer: Self-pay | Admitting: Family Medicine

## 2021-03-15 NOTE — Telephone Encounter (Signed)
Kathleen Williamson with [facility] would like a call back to discuss when pt prolia shot needs to be scheduled.

## 2021-03-15 NOTE — Telephone Encounter (Addendum)
Lvm asking [name] to call back.  Need to schedule Prolia shot for pt at [facility].

## 2021-03-15 NOTE — Telephone Encounter (Signed)
See phn note, 03/15/21.

## 2021-03-18 NOTE — Telephone Encounter (Signed)
Pt scheduled on 04/02/21 at 3:15.

## 2021-03-25 NOTE — Telephone Encounter (Addendum)
Called [name] with [facility] and let her know that we need BMP done prior to her appointment. Order to be faxed to [facility] at [phone] faxed now. [name] will let patient know also estimated out of pocket cost.

## 2021-03-29 NOTE — Telephone Encounter (Signed)
Called and left a message. Patient cancelled her nurse visit per epic on 04/02/21. Need to find out the reason and if labs were drawn.

## 2021-04-01 NOTE — Telephone Encounter (Signed)
Kathleen Williamson called she aware that the appts were cancelled because she cancelled them

## 2021-04-02 ENCOUNTER — Ambulatory Visit: Payer: PPO

## 2021-04-04 NOTE — Telephone Encounter (Signed)
Kathleen Williamson([facility]) [phone]  Left message to call back. Need to know if patient will be rescheduling her Prolia and did they draw labs on her at [facility]. Need to speak with [name] personally

## 2021-04-05 NOTE — Telephone Encounter (Signed)
Left detailed message for Kathleen Williamson with my questions below so I can let PCP know

## 2021-04-09 ENCOUNTER — Telehealth: Payer: Self-pay

## 2021-04-09 NOTE — Telephone Encounter (Signed)
Spoke with [name]. Patient was advised of the appointment and the need for injection per [facility]. Patient stated that she would not be able to keep her appointment due to the holidays and not feeling well. Patient did not say to [name] that she would never have this done again but did not have initiative to reschedule. Labs were not drawn because patient did not want to have them done. I told michelle I will call her back in a couple of week when I can re submit patient's benefits for Prolia for 2023 and re visit this to see if patient will proceed at that time. FYI to PCP

## 2021-04-09 NOTE — Chronic Care Management (AMB) (Addendum)
Chronic Care Management Pharmacy Assistant   Name: TYERRA LORETTO  MRN: [id number] DOB: 1940-11-17   Reason for Encounter: Medication Adherence and Delivery Coordination  Recent office visits:  None since last CCM contact  Recent consult visits:  None since last CCM contact  Hospital visits:  None in previous 6 months  Medications: Outpatient Encounter Medications as of 04/09/2021  Medication Sig   Accu-Chek FastClix Lancets MISC USE AS INSTRUCTED TO CHECK BLOOD SUGAR ONCE DAILY   ACCU-CHEK GUIDE test strip USE AS INSTRUCTED TO CHECK BLOOD SUGAR ONCE DAILY   acetaminophen (TYLENOL) 325 MG tablet Take 650 mg by mouth every 6 (six) hours as needed for moderate pain or headache.   amLODipine (NORVASC) 2.5 MG tablet Take 1 tablet (2.5 mg total) by mouth daily.   Blood Glucose Monitoring Suppl (ACCU-CHEK GUIDE) w/Device KIT 1 each by Does not apply route as directed. Use as instructed to check blood sugar once daily   clonazePAM (KLONOPIN) 0.5 MG tablet Take 1 tablet (0.5 mg total) by mouth daily as needed for anxiety (sedation precautions). (Patient not taking: Reported on 12/11/2020)   dapagliflozin propanediol (FARXIGA) 5 MG TABS tablet Take 1 tablet (5 mg total) by mouth daily before breakfast. (Patient not taking: Reported on 03/13/2021)   denosumab (PROLIA) 60 MG/ML SOSY injection Inject 60 mg into the skin every 6 (six) months.   ezetimibe (ZETIA) 10 MG tablet TAKE ONE TABLET BY MOUTH ONCE DAILY   levothyroxine (SYNTHROID) 50 MCG tablet Take 1 tablet (50 mcg total) by mouth daily.   loperamide (IMODIUM A-D) 2 MG tablet Take 2 mg by mouth 4 (four) times daily as needed for diarrhea or loose stools.   mirtazapine (REMERON) 15 MG tablet Take 1 tablet (15 mg total) by mouth at bedtime.   nitroGLYCERIN (NITROLINGUAL) 0.4 MG/SPRAY spray Place 1 spray under the tongue every 5 (five) minutes as needed. (Patient taking differently: Place 1 spray under the tongue every 5 (five) minutes as  needed for chest pain.)   pravastatin (PRAVACHOL) 80 MG tablet TAKE ONE TABLET BY MOUTH EVERYDAY AT BEDTIME   venlafaxine XR (EFFEXOR-XR) 150 MG 24 hr capsule Take 1 capsule (150 mg total) by mouth daily with breakfast.   No facility-administered encounter medications on file as of 04/09/2021.    BP Readings from Last 3 Encounters:  12/11/20 (!) 160/90  11/16/20 (!) 157/80  11/06/20 (!) 173/77    Lab Results  Component Value Date   HGBA1C 6.7 (H) 10/01/2020     No OVs, Consults, or hospital visits since last care coordination call / Pharmacist visit. No medication changes indicated  Last adherence delivery date:03/20/21      Patient is due for next adherence delivery on: 04/19/21  Spoke with patient on 04/09/21 reviewed medications and coordinated delivery.  This delivery to include: Adherence Packaging  30 Days  Packs: Co Q10 100 mg- take 1 tablet bedtime Venlafaxine XR 150 mg - take 1 tablet breakfast Mirtazapine 15 mg - take 1 tablet bedtime Pravastatin 80 mg - take 1 tablet bedtime Levothyroxine 50 mcg - take 1 tablet breakfast Aspirin 81 mg - take 1 tablet breakfast Ezetimibe 10 mg - take 1 tablet breakfast Amlodipine 2.5 mg - take 1 tablet by mouth daily breakfast  VIAL medications: none   Patient declined the following medications this month: none   Any concerns about your medications? The patient stopped taking the [place] due to she is afraid to take this medication after reading about  it.   How often do you forget or accidentally miss a dose? Never   Is patient in packaging Yes  What is the date on your next pill pack? Patient not near medication to read date   Any concerns or issues with your packaging? Patient reports the packaging keeps her straight with her medications    No refill request needed.  Confirmed delivery date of 04/19/21, advised patient that pharmacy will contact them the morning of delivery.  Recent blood pressure readings are as  follows:none available  nurse has stopped coming to the home and the patient reports she does not have a home monitor.  Recent blood glucose readings are as follows: Fasting:   04/08/21 - 200   patient has not been eating well, she has a cold for 10 days now and is on OTC  AM and PM cold medication   Annual wellness visit in last year? Yes Most Recent BP reading:  160/90  12/11/20  If Diabetic: Most recent A1C reading: 6.7  10/01/20 Last eye exam / retinopathy screening: up to date Last diabetic foot exam: 2020  Debbora Dus, CPP notified  Avel Sensor, [place] Assistant [phone]  I have reviewed the care management and care coordination activities outlined in this encounter and I am certifying that I agree with the content of this note. No further action required.  Debbora Dus, PharmD Clinical Pharmacist [place] [facility] at [facility] [phone]

## 2021-04-29 NOTE — Telephone Encounter (Signed)
Noted  

## 2021-05-01 NOTE — Telephone Encounter (Signed)
Benefits submitted-pending answer. Then will call [name] to follow up with patient on Prolia

## 2021-05-06 DIAGNOSIS — S0501XA Injury of conjunctiva and corneal abrasion without foreign body, right eye, initial encounter: Secondary | ICD-10-CM | POA: Diagnosis not present

## 2021-05-06 LAB — HM DIABETES EYE EXAM

## 2021-05-08 ENCOUNTER — Telehealth: Payer: Self-pay | Admitting: Family Medicine

## 2021-05-09 ENCOUNTER — Telehealth: Payer: Self-pay

## 2021-05-09 NOTE — Chronic Care Management (AMB) (Addendum)
Chronic Care Management Pharmacy Assistant   Name: Kathleen Williamson  MRN: [id number] DOB: 03/30/1941  Reason for Encounter: Medication Adherence and Delivery Coordination    Recent office visits:  None since last CCM contact  Recent consult visits:  None since last CCM contact  Hospital visits:  None in previous 6 months  Medications: Outpatient Encounter Medications as of 05/09/2021  Medication Sig   Accu-Chek FastClix Lancets MISC USE AS INSTRUCTED TO CHECK BLOOD SUGAR ONCE DAILY   ACCU-CHEK GUIDE test strip USE AS INSTRUCTED TO CHECK BLOOD SUGAR ONCE DAILY   acetaminophen (TYLENOL) 325 MG tablet Take 650 mg by mouth every 6 (six) hours as needed for moderate pain or headache.   amLODipine (NORVASC) 2.5 MG tablet Take 1 tablet (2.5 mg total) by mouth daily.   Blood Glucose Monitoring Suppl (ACCU-CHEK GUIDE) w/Device KIT 1 each by Does not apply route as directed. Use as instructed to check blood sugar once daily   clonazePAM (KLONOPIN) 0.5 MG tablet Take 1 tablet (0.5 mg total) by mouth daily as needed for anxiety (sedation precautions). (Patient not taking: Reported on 12/11/2020)   dapagliflozin propanediol (FARXIGA) 5 MG TABS tablet Take 1 tablet (5 mg total) by mouth daily before breakfast. (Patient not taking: Reported on 03/13/2021)   denosumab (PROLIA) 60 MG/ML SOSY injection Inject 60 mg into the skin every 6 (six) months.   ezetimibe (ZETIA) 10 MG tablet TAKE ONE TABLET BY MOUTH ONCE DAILY   levothyroxine (SYNTHROID) 50 MCG tablet Take 1 tablet (50 mcg total) by mouth daily.   loperamide (IMODIUM A-D) 2 MG tablet Take 2 mg by mouth 4 (four) times daily as needed for diarrhea or loose stools.   mirtazapine (REMERON) 15 MG tablet Take 1 tablet (15 mg total) by mouth at bedtime.   nitroGLYCERIN (NITROLINGUAL) 0.4 MG/SPRAY spray Place 1 spray under the tongue every 5 (five) minutes as needed. (Patient taking differently: Place 1 spray under the tongue every 5 (five) minutes as  needed for chest pain.)   pravastatin (PRAVACHOL) 80 MG tablet TAKE ONE TABLET BY MOUTH EVERYDAY AT BEDTIME   venlafaxine XR (EFFEXOR-XR) 150 MG 24 hr capsule Take 1 capsule (150 mg total) by mouth daily with breakfast.   No facility-administered encounter medications on file as of 05/09/2021.   BP Readings from Last 3 Encounters:  12/11/20 (!) 160/90  11/16/20 (!) 157/80  11/06/20 (!) 173/77    Lab Results  Component Value Date   HGBA1C 6.7 (H) 10/01/2020      No OVs, Consults, or hospital visits since last care coordination call / Pharmacist visit. No medication changes indicated   Last adherence delivery date:04/19/21      Patient is due for next adherence delivery on: 05/21/21  Spoke with patient on 05/10/21 reviewed medications and coordinated delivery.  This delivery to include: Adherence Packaging  30 Days  Packs: Co Q10 100 mg- take 1 tablet bedtime Venlafaxine ER 150 mg - take 1 tablet breakfast Mirtazapine 15 mg - take 1 tablet bedtime Pravastatin 80 mg - take 1 tablet bedtime Levothyroxine 50 mcg - take 1 tablet breakfast Aspirin 81 mg - take 1 tablet breakfast Ezetimibe 10 mg - take 1 tablet breakfast Amlodipine 2.5 mg - take 1 tablet by mouth daily breakfast  VIAL medications: none  Patient declined the following medications this month: Farxiga 5 mg daily - PAP  Any concerns about your medications? Yes  The patient reports she continues to receive Farxiga 60m from the  manufacturer but has not been taking. She now has a 6 months supply on hand. She was afraid to take due to reading side effects. She wants to resume taking the medication.  How often do you forget or accidentally miss a dose? Never  Is patient in packaging Yes  What is the date on your next pill pack? The patient's current pill pack dates are 04/25/21 thru 05/24/21  Any concerns or issues with your packaging? No concerns at this time    Refills requested from providers include: Venlafaxine  ER  Confirmed delivery date of 05/21/21, advised patient that pharmacy will contact them the morning of delivery.  Recent blood pressure readings are as follows: none available, encouraged the patient to use the free devices in grocery store or drug store and write it down if possible  Recent blood glucose readings are as follows: Fasting:  05/10/21-154   9:00am   05/04/21-221   9:00am  145  in the pm   05/01/21-152  Annual wellness visit in last year? Yes Most Recent BP reading:  160/90  12/11/20  If Diabetic: Most recent A1C reading: 6.7  10/01/20 Last eye exam / retinopathy screening: up to date Last diabetic foot exam: 2020  Debbora Dus, CPP notified  Avel Sensor, [place] Assistant [phone]  I have reviewed the care management and care coordination activities outlined in this encounter and I am certifying that I agree with the content of this note. No further action required.  Debbora Dus, PharmD Clinical Pharmacist [facility] [facility] at [facility] [phone]

## 2021-05-11 NOTE — Telephone Encounter (Signed)
Refilled #90 plz schedule OV

## 2021-05-14 DIAGNOSIS — E1122 Type 2 diabetes mellitus with diabetic chronic kidney disease: Secondary | ICD-10-CM | POA: Diagnosis not present

## 2021-05-14 DIAGNOSIS — I129 Hypertensive chronic kidney disease with stage 1 through stage 4 chronic kidney disease, or unspecified chronic kidney disease: Secondary | ICD-10-CM | POA: Diagnosis not present

## 2021-05-14 DIAGNOSIS — Z7984 Long term (current) use of oral hypoglycemic drugs: Secondary | ICD-10-CM | POA: Diagnosis not present

## 2021-05-14 DIAGNOSIS — Z79890 Hormone replacement therapy (postmenopausal): Secondary | ICD-10-CM | POA: Diagnosis not present

## 2021-05-14 DIAGNOSIS — E039 Hypothyroidism, unspecified: Secondary | ICD-10-CM | POA: Diagnosis not present

## 2021-05-14 DIAGNOSIS — F3340 Major depressive disorder, recurrent, in remission, unspecified: Secondary | ICD-10-CM | POA: Diagnosis not present

## 2021-05-14 DIAGNOSIS — N183 Chronic kidney disease, stage 3 unspecified: Secondary | ICD-10-CM | POA: Diagnosis not present

## 2021-05-14 DIAGNOSIS — I25118 Atherosclerotic heart disease of native coronary artery with other forms of angina pectoris: Secondary | ICD-10-CM | POA: Diagnosis not present

## 2021-05-14 DIAGNOSIS — Z951 Presence of aortocoronary bypass graft: Secondary | ICD-10-CM | POA: Diagnosis not present

## 2021-05-14 NOTE — Telephone Encounter (Signed)
Noted  

## 2021-05-14 NOTE — Telephone Encounter (Signed)
Called Mrs. Horne and got her scheduled for 3/7 @230 

## 2021-05-20 ENCOUNTER — Other Ambulatory Visit: Payer: Self-pay | Admitting: Family Medicine

## 2021-05-20 DIAGNOSIS — E118 Type 2 diabetes mellitus with unspecified complications: Secondary | ICD-10-CM

## 2021-05-23 ENCOUNTER — Other Ambulatory Visit: Payer: Self-pay | Admitting: Family Medicine

## 2021-05-23 ENCOUNTER — Telehealth: Payer: Self-pay

## 2021-05-23 DIAGNOSIS — E118 Type 2 diabetes mellitus with unspecified complications: Secondary | ICD-10-CM

## 2021-05-23 MED ORDER — ONETOUCH VERIO VI STRP: ORAL_STRIP | 3 refills | Status: AC

## 2021-05-23 NOTE — Progress Notes (Signed)
° ° °  Chronic Care Management Pharmacy Assistant   Name: Kathleen Williamson  MRN: [id number] DOB: Dec 26, 1940  Reason for Encounter: Reason for Encounter: CCM (Farxiga 2023 Approved)   Called [place] [employer] [place] to check on status of patient's Farxiga patient assistance application for [id number]. Patient has been approved through 04/13/2022. I called patient to inform her; phone number is not working.   Debbora Dus, CPP notified  Marijean Niemann, [place] Clinical Pharmacy Assistant [phone]  Time Spent: 10 Minutes

## 2021-05-23 NOTE — Addendum Note (Signed)
Addended by: Brenton Grills on: 11/21/8419 03:12 AM   Modules accepted: Orders

## 2021-05-23 NOTE — Telephone Encounter (Signed)
Sent new rx for OneTouch Verio test strips.

## 2021-05-27 DIAGNOSIS — H16223 Keratoconjunctivitis sicca, not specified as Sjogren's, bilateral: Secondary | ICD-10-CM | POA: Diagnosis not present

## 2021-05-27 DIAGNOSIS — E119 Type 2 diabetes mellitus without complications: Secondary | ICD-10-CM | POA: Diagnosis not present

## 2021-05-27 LAB — HM DIABETES EYE EXAM

## 2021-06-05 ENCOUNTER — Telehealth: Payer: Self-pay

## 2021-06-05 NOTE — Telephone Encounter (Signed)
OOP cost will be around $300 for the patient. I called [name] at [facility] and left a message asking her to follow up with patient to see if she would like to proceed with the injection. If so, we do need Vitamin D and BMP levels checked-these orders were faxed over a couple of months ago and I am not sure if new updated order will be needed. Patient does have an appointment scheduled with Dr Darnell Level on 06/18/21 and this can be discussed then.

## 2021-06-05 NOTE — Chronic Care Management (AMB) (Signed)
° ° °  Chronic Care Management Pharmacy Assistant   Name: Kathleen Williamson  MRN: [id number] DOB: 1940-10-01  Reason for Encounter: Reminder Call   Conditions to be addressed/monitored: HLD, DMII, and CKD Stage 3   Medications: Outpatient Encounter Medications as of 06/05/2021  Medication Sig   Accu-Chek FastClix Lancets MISC USE AS INSTRUCTED TO CHECK BLOOD SUGAR ONCE DAILY   acetaminophen (TYLENOL) 325 MG tablet Take 650 mg by mouth every 6 (six) hours as needed for moderate pain or headache.   amLODipine (NORVASC) 2.5 MG tablet Take 1 tablet (2.5 mg total) by mouth daily.   Blood Glucose Monitoring Suppl (ACCU-CHEK GUIDE) w/Device KIT 1 each by Does not apply route as directed. Use as instructed to check blood sugar once daily   clonazePAM (KLONOPIN) 0.5 MG tablet Take 1 tablet (0.5 mg total) by mouth daily as needed for anxiety (sedation precautions). (Patient not taking: Reported on 12/11/2020)   dapagliflozin propanediol (FARXIGA) 5 MG TABS tablet Take 1 tablet (5 mg total) by mouth daily before breakfast. (Patient not taking: Reported on 03/13/2021)   denosumab (PROLIA) 60 MG/ML SOSY injection Inject 60 mg into the skin every 6 (six) months.   ezetimibe (ZETIA) 10 MG tablet TAKE ONE TABLET BY MOUTH ONCE DAILY   glucose blood (ONETOUCH VERIO) test strip Use as instructed to check blood sugar once a day   levothyroxine (SYNTHROID) 50 MCG tablet Take 1 tablet (50 mcg total) by mouth daily.   loperamide (IMODIUM A-D) 2 MG tablet Take 2 mg by mouth 4 (four) times daily as needed for diarrhea or loose stools.   mirtazapine (REMERON) 15 MG tablet Take 1 tablet (15 mg total) by mouth at bedtime.   nitroGLYCERIN (NITROLINGUAL) 0.4 MG/SPRAY spray Place 1 spray under the tongue every 5 (five) minutes as needed. (Patient taking differently: Place 1 spray under the tongue every 5 (five) minutes as needed for chest pain.)   pravastatin (PRAVACHOL) 80 MG tablet TAKE ONE TABLET BY MOUTH EVERYDAY AT BEDTIME    venlafaxine XR (EFFEXOR-XR) 150 MG 24 hr capsule TAKE ONE TABLET BY MOUTH EVERY MORNING   No facility-administered encounter medications on file as of 06/05/2021.   Kathleen Williamson was contacted to remind of upcoming telephone visit with Charlene Brooke on 06/07/21 at 11:00am. Patient was reminded to have all medications, supplements and any blood glucose and blood pressure readings available for review at appointment. If unable to reach, a voicemail was left for patient.    Are you having any problems with your medications? Patient reports she just bought a bottle of Prevagen at [facility] and is going to start taking .   Do you have any concerns you like to discuss with the pharmacist? I communicated to the patient that she got approved for Farxiga through 2023. She has taken this for the benefit with having DM and also stopped taking for awhile due to reading the side effects of Farxiga and yeast infections. The patient has history of yeast infections so she hesitates in taking this medication.     Star Rating Drugs: Medication:  Last Fill: Day Supply Farxiga 5mg   Gets from manufacturer Pravastatin 80mg  05/15/21  [place] [facility] notified  Avel Sensor, [place] Assistant [phone]  Total time spent for month CPA: 20 min

## 2021-06-06 NOTE — Telephone Encounter (Signed)
Spoke with [name]. Patient does want to proceed with Prolia. Will get this done during her OV with Dr Darnell Level on 06/18/21. Labs will be done at [facility] on 06/10/21 and results will be sent to [name] to review.

## 2021-06-07 ENCOUNTER — Other Ambulatory Visit: Payer: Self-pay

## 2021-06-07 ENCOUNTER — Other Ambulatory Visit: Payer: Self-pay | Admitting: Family Medicine

## 2021-06-07 ENCOUNTER — Telehealth: Payer: Self-pay

## 2021-06-07 ENCOUNTER — Ambulatory Visit (INDEPENDENT_AMBULATORY_CARE_PROVIDER_SITE_OTHER): Payer: PPO | Admitting: Pharmacist

## 2021-06-07 DIAGNOSIS — R030 Elevated blood-pressure reading, without diagnosis of hypertension: Secondary | ICD-10-CM

## 2021-06-07 DIAGNOSIS — F332 Major depressive disorder, recurrent severe without psychotic features: Secondary | ICD-10-CM

## 2021-06-07 DIAGNOSIS — N183 Chronic kidney disease, stage 3 unspecified: Secondary | ICD-10-CM

## 2021-06-07 DIAGNOSIS — I2510 Atherosclerotic heart disease of native coronary artery without angina pectoris: Secondary | ICD-10-CM

## 2021-06-07 DIAGNOSIS — E118 Type 2 diabetes mellitus with unspecified complications: Secondary | ICD-10-CM

## 2021-06-07 DIAGNOSIS — E1169 Type 2 diabetes mellitus with other specified complication: Secondary | ICD-10-CM

## 2021-06-07 DIAGNOSIS — M810 Age-related osteoporosis without current pathological fracture: Secondary | ICD-10-CM

## 2021-06-07 NOTE — Chronic Care Management (AMB) (Signed)
Chronic Care Management Pharmacy Assistant   Name: Kathleen Williamson  MRN: [id number] DOB: [birth year]   Reason for Encounter: Medication Adherence and Delivery Coordination    Recent office visits:  06/07/21-Family Medicine-Lindsey Foltanski,RPH,CPP- Telemedicine-Hold off on Farixiga until PCP appt.  Recent consult visits:  None since last CCM contact  Hospital visits:  None in previous 6 months  Medications: Outpatient Encounter Medications as of 06/07/2021  Medication Sig Note   Accu-Chek FastClix Lancets MISC USE AS INSTRUCTED TO CHECK BLOOD SUGAR ONCE DAILY    acetaminophen (TYLENOL) 325 MG tablet Take 650 mg by mouth every 6 (six) hours as needed for moderate pain or headache.    amLODipine (NORVASC) 2.5 MG tablet Take 1 tablet (2.5 mg total) by mouth daily.    Blood Glucose Monitoring Suppl (ACCU-CHEK GUIDE) w/Device KIT 1 each by Does not apply route as directed. Use as instructed to check blood sugar once daily    clonazePAM (KLONOPIN) 0.5 MG tablet Take 1 tablet (0.5 mg total) by mouth daily as needed for anxiety (sedation precautions).    dapagliflozin propanediol (FARXIGA) 5 MG TABS tablet Take 1 tablet (5 mg total) by mouth daily before breakfast. (Patient not taking: Reported on 06/07/2021) 06/07/2021: Never started - fear of yeast infections   denosumab (PROLIA) 60 MG/ML SOSY injection Inject 60 mg into the skin every 6 (six) months. 06/07/2021: Last injection 08/03/20   ezetimibe (ZETIA) 10 MG tablet TAKE ONE TABLET BY MOUTH ONCE DAILY    glucose blood (ONETOUCH VERIO) test strip Use as instructed to check blood sugar once a day    levothyroxine (SYNTHROID) 50 MCG tablet Take 1 tablet (50 mcg total) by mouth daily.    loperamide (IMODIUM A-D) 2 MG tablet Take 2 mg by mouth 4 (four) times daily as needed for diarrhea or loose stools.    mirtazapine (REMERON) 15 MG tablet Take 1 tablet (15 mg total) by mouth at bedtime.    nitroGLYCERIN (NITROLINGUAL) 0.4 MG/SPRAY spray Place  1 spray under the tongue every 5 (five) minutes as needed. (Patient taking differently: Place 1 spray under the tongue every 5 (five) minutes as needed for chest pain.)    pravastatin (PRAVACHOL) 80 MG tablet TAKE ONE TABLET BY MOUTH EVERYDAY AT BEDTIME    venlafaxine XR (EFFEXOR-XR) 150 MG 24 hr capsule TAKE ONE TABLET BY MOUTH EVERY MORNING    No facility-administered encounter medications on file as of 06/07/2021.   BP Readings from Last 3 Encounters:  12/11/20 (!) 160/90  11/16/20 (!) 157/80  11/06/20 (!) 173/77    Lab Results  Component Value Date   HGBA1C 6.7 (H) 10/01/2020      Recent OV, Consult or Hospital visit:  06/07/21- CCM No medication changes indicated   Last adherence delivery date:05/21/21      Patient is due for next adherence delivery on: 06/20/21  Spoke with patient on 06/10/21 reviewed medications and coordinated delivery.  This delivery to include: Adherence Packaging  30 Days  Packs: Co Q10 100 mg- take 1 tablet bedtime Venlafaxine ER 150 mg - take 1 tablet breakfast Mirtazapine 15 mg - take 1 tablet bedtime Pravastatin 80 mg - take 1 tablet bedtime Levothyroxine 50 mcg - take 1 tablet breakfast Aspirin 81 mg - take 1 tablet breakfast Ezetimibe 10 mg - take 1 tablet breakfast Amlodipine 2.5 mg - take 1 tablet by mouth daily breakfast  VIAL medications: none   Patient declined the following medications this month:    Any  concerns about your medications? No  How often do you forget or accidentally miss a dose? Never   Is patient in packaging Yes  What is the date on your next pill pack?06/10/21  Any concerns or issues with your packaging? No concerns with packaging    Refills requested from providers include: Mirtazapine   Confirmed delivery date of 06/20/21, advised patient that pharmacy will contact them the morning of delivery.  Recent blood pressure readings are as follows: no readings available-suggested she start taking and making a BP  log.   Annual wellness visit in last year? Yes Most Recent BP reading:160/90  87-P 12/11/20    discussed with patient to start monitoring BP at home and log.  [place]  CPP notified  Avel Sensor, [place] Assistant [phone]  Total time spent for month CPA: 40  min

## 2021-06-07 NOTE — Patient Instructions (Signed)
Visit Information  Phone number for Pharmacist: [phone]   Goals Addressed             This Visit's Progress    Track and Manage My Blood Pressure-Hypertension       Timeframe:  Long-Range Goal Priority:  High Start Date:      06/07/21                       Expected End Date:  06/07/22                     Follow Up Date April 2023   - check blood pressure weekly - choose a place to take my blood pressure (home, clinic or office, retail store) - write blood pressure results in a log or diary    Why is this important?   You won't feel high blood pressure, but it can still hurt your blood vessels.  High blood pressure can cause heart or kidney problems. It can also cause a stroke.  Making lifestyle changes like losing a little weight or eating less salt will help.  Checking your blood pressure at home and at different times of the day can help to control blood pressure.  If the doctor prescribes medicine remember to take it the way the doctor ordered.  Call the office if you cannot afford the medicine or if there are questions about it.     Notes:         Care Plan : [place]  Updates made by Charlton Haws, RPH since 06/07/2021 12:00 AM     Problem: Hypertension, Hyperlipidemia, Diabetes, Coronary Artery Disease, Chronic Kidney Disease, Depression, and Osteoporosis   Priority: High     Long-Range Goal: Disease mgmt   Start Date: 08/01/2020  Expected End Date: 06/07/2022  This Visit's Progress: On track  Priority: High  Note:   Current Barriers:  Unable to independently monitor therapeutic efficacy Suboptimal therapeutic regimen for BP/CKD  Pharmacist Clinical Goal(s):  Patient will achieve adherence to monitoring guidelines and medication adherence to achieve therapeutic efficacy adhere to plan to optimize therapeutic regimen for HTN/CKD as evidenced by report of adherence to recommended medication management changes through collaboration with  PharmD and provider.   Interventions:  1:1 collaboration with [name], MD regarding development and update of comprehensive plan of care as evidenced by provider attestation and co-signature  Inter-disciplinary care team collaboration (see longitudinal plan of care)  Comprehensive medication review performed; medication list updated in electronic medical record  Elevated BP (no HTN dx) (BP goal <130/80 w/ DM, CKD) -Uncontrolled - pt is not checking BP at home; BP in office was elevated though she has not been seen since July 2022 when she was ill; Denies hypotensive/hypertensive symptoms -Current home BP readings: n/a (does not have a cuff) -Current treatment: Amlodipine 2.5 mg daily - Appropriate, Query Effective -Medications previously tried: metoprolol  -Educated on BP goals and benefits of medications for prevention of heart attack, stroke and kidney damage; Importance of home blood pressure monitoring; -Counseled to monitor BP at pharmacy/grocery periodically, document, and provide log at future appointments -Recommended to continue current medication; consider ACE/ARB for renal protection depending on next BMP (do not start at same time as [place])- pt has upcoming PCP visit 06/18/21  Hyperlipidemia/CAD: (LDL goal < 70) -Controlled - adequate, LDL 77; pt is compliant with medications in pill pack -Hx CAD - CABGx5 2003 -Current treatment: Pravastatin 80 mg  daily at bedtime - Appropriate, Effective, Safe, Accessible Ezetimibe 10 mg daily at breakfast  -Appropriate, Effective, Safe, Accessible OTC QoQ10 100 mg daily-  Appropriate, Effective, Safe, Accessible OTC Aspirin EC 81 mg daily - Appropriate, Effective, Safe, Accessible -Medications previously tried: Crestor, niacin  -Recommended to continue current medication  Diabetes (A1c goal <7%) -Controlled - A1c 6.7 (09/2020) when patient was taking Rybelsus 3 mg; she was changed to [facility] July 2022 due to cost and kidney  benefits, but per patient report she never started this due to fear of UTI side effects; she has continued to receive Farxiga from [facility] and now has 6+ month supply on hand -Current BG readings: not available during visit today -Current medications: Farxiga 5 mg daily (PAP) - not taking due to fear of side effects (UTI/yeast infections) -Medications previously tried: Geneticist, molecular (cost), Rybelsus, avoided metformin due to GFR -Discussed benefits of Farxiga for kidney protection, BP benefits as well as DM; discussed risk for UTI/mycotic infections increases with increasing A1c; since patient is seeing PCP in< 2 weeks, will continue to hold off on starting Farxiga, depending on A1c/BMP result may consider starting Farxiga   Depression/Anxiety (Goal: Control symptoms) -Controlled, patient has been doing a lot better lately -Current treatment: Mirtazapine 15 mg daily HS - Appropriate, Effective, Safe, Accessible Venlafaxine XR 150 mg daily AM - Appropriate, Effective, Safe, Accessible Clonazepam 0.5 mg only PRN - Appropriate, Effective, Safe, Accessible -Medications previously tried/failed: Citalopram -Recommend to continue current medication  Osteoporosis (Goal prevent fractures) -Not ideally controlled - pt is overdue for Prolia dose (due 01/2021) -Last DEXA Scan: 06/24/19   T-Score femoral neck: -2.7  T-Score total hip: -2.4  T-Score lumbar spine: -2.1 -Patient is a candidate for pharmacologic treatment due to T-Score < -2.5 in femoral neck -Current treatment  Prolia q6 months (started 09/02/17, last dose 08/03/20) -Medications previously tried: none  -Recommend to continue current medication; Prolia dose to be given 3/7 @ PCP appt  Health Maintenance -Vaccine gaps: Flu, PPSV or PCV20. Updated chart with covid booster 01/03/21. -Medicare adherence: significantly improved with pill packs  Patient Goals/Self-Care Activities Patient will:  - take medications as prescribed as evidenced by  patient report and record review focus on medication adherence by pill packs check glucose 3x weekly, document, and provide at future appointments check blood pressure periodically at pharmacy/grocery, document, and provide at future appointments      The patient verbalized understanding of instructions, educational materials, and care plan provided today and declined offer to receive copy of patient instructions, educational materials, and care plan.  Telephone follow up appointment with pharmacy team member scheduled for: 2 months  Charlene Brooke, PharmD, [facility] Clinical Pharmacist [facility] [phone]

## 2021-06-07 NOTE — Telephone Encounter (Signed)
Refill Remeron Last refill 10/12/20 #60/6 Last office visit 99/1/22 acute Upcoming appointment 06/18/21

## 2021-06-07 NOTE — Progress Notes (Signed)
Chronic Care Management Pharmacy Note  06/07/2021 Name:  Kathleen Williamson MRN:  [id number] DOB:  [birth year]  Summary: CCM F/U visit -Medication adherence significantly improved with pill packs/delivery -Pt never started [place] (prescribed July 2022 for DM/CKD benefits and pt has been receiving through patient assistance, has 6+ month supply on hand) due to fear of yeast infections. Discussed benefits of Farxiga, risk for genital/mycotic infections increases with increasing A1c. If her A1c remains < 7% she would be low risk for mycotic infections -Pt is not checking BP at home (no BP monitor); BP has been elevated in most recent office visits while patient was ill, but not checked since 10/2020  Recommendations: -Advised it is reasonable to hold off on Farxiga until upcoming PCP appt 3/7; consider restarting Farxiga depending on A1c/BMP results -Consider ACE/ARB for kidney protection if she is not willing to start [place] at PCP visit. BP goal < 130/80 with CKD/DM. Avoid starting Farxiga and ACE/ARB at the same time.  Plan: -[name] will call patient 1 month for pill pack coordination -Pharmacist follow up televisit scheduled for 2 months -PCP visit 06/18/21. Prolia injection due.    Subjective: Kathleen Williamson is an 81 y.o. year old female who is a primary patient of [name], MD.  The CCM team was consulted for assistance with disease management and care coordination needs.    Engaged with patient by telephone or follow up visit in response to provider referral for pharmacy case management and/or care coordination services.   Consent to Services:  The patient was given information about Chronic Care Management services, agreed to services, and gave verbal consent prior to initiation of services.  Please see initial visit note for detailed documentation.   Patient Care Team: [name], MD as PCP - General (Family Medicine) Jerline Pain, MD as PCP -  Cardiology (Cardiology) Dingeldein, Remo Lipps, MD as Referring Physician (Ophthalmology) Charlton Haws, [facility] as Pharmacist (Pharmacist)   Recent office visits: 10/30/20 Dr Darnell Level OV: f/u UTI; change Klonopin to 0.5 mg 1 tablet only PRN. Sent to [facility]. 10/01/20- PCP - Presented for pre-op eval, Drop klonopin dose to 1/2 tablet in the morning and 1 tablet at night time - do this for at least a week and monitor effect on balance. If mood is doing well, may stop the morning dose altogether.  Recent consult visits: None since last CCM visit   Hospital visits: ED - 10/06/20 - Confusion, thought to be chronic process, follow up with neurology ED - 07/27/20 - Generalized weakness x 2 weeks, fall. BG 315, not on any medications for diabetes. Work up unremarkable. Follow up with PCP for diabetes management.  Objective:  Lab Results  Component Value Date   CREATININE 1.2 (A) 11/26/2020   BUN 17 11/16/2020   GFR 39.68 (L) 10/01/2020   GFRNONAA 47 (L) 11/16/2020   GFRAA 46 (L) 01/31/2014   NA 138 11/16/2020   K 3.9 11/26/2020   CALCIUM 8.2 (L) 11/16/2020   CO2 21 (L) 11/16/2020   GLUCOSE 153 (H) 11/16/2020    Lab Results  Component Value Date/Time   HGBA1C 6.7 (H) 10/01/2020 12:11 PM   HGBA1C 7.6 (H) 06/18/2020 10:55 AM   GFR 39.68 (L) 10/01/2020 12:11 PM   GFR 32.40 (L) 06/18/2020 10:55 AM   MICROALBUR 5.7 (H) 06/18/2020 10:55 AM   MICROALBUR 0.7 02/18/2019 09:44 AM    Last diabetic Eye exam:  Lab Results  Component Value Date/Time   HMDIABEYEEXA No  Retinopathy 05/22/2020 12:00 AM    Last diabetic Foot exam:  09/27/2018 normal   Lab Results  Component Value Date   CHOL 156 02/18/2019   HDL 39.30 02/18/2019   LDLCALC 77 02/18/2019   LDLDIRECT 77.0 06/18/2020   TRIG 194.0 (H) 02/18/2019   CHOLHDL 4 02/18/2019    Hepatic Function Latest Ref Rng & Units 11/06/2020 10/06/2020 10/01/2020  Total Protein 6.5 - 8.1 g/dL 7.0 7.2 6.7  Albumin 3.5 - 5.0 g/dL 4.2 3.9 4.3  AST 15 - 41  U/L _0 ALT 0 - 44 U/L _1 Alk Phosphatase 38 - 126 U/L 50 64 64  Total Bilirubin 0.3 - 1.2 mg/dL 0.6 0.6 0.4    Lab Results  Component Value Date/Time   TSH 1.378 10/06/2020 06:06 PM   TSH 1.10 10/01/2020 12:11 PM   TSH 0.346 (L) 07/28/2020 12:26 AM   TSH 0.96 06/18/2020 10:55 AM   FREET4 1.03 10/06/2020 06:06 PM   FREET4 0.93 10/01/2020 12:11 PM    CBC Latest Ref Rng & Units 11/16/2020 11/15/2020 11/06/2020  WBC 4.0 - 10.5 K/uL 9.9 9.8 8.8  Hemoglobin 12.0 - 15.0 g/dL 10.1(L) 10.6(L) 11.6(L)  Hematocrit 36.0 - 46.0 % 32.6(L) 33.8(L) 37.6  Platelets 150 - 400 K/uL 204 253 305    Lab Results  Component Value Date/Time   VD25OH 29.07 (L) 06/18/2020 10:55 AM   VD25OH 35.78 09/16/2019 02:42 PM    Clinical ASCVD: Yes  The 10-year ASCVD risk score (Arnett DK, et al., 2019) is: 54.6%   Values used to calculate the score:     Age: 39 years     Sex: Female     Is Non-Hispanic African American: No     Diabetic: Yes     Tobacco smoker: No     Systolic Blood Pressure: 811 mmHg     Is BP treated: No     HDL Cholesterol: 39.3 mg/dL     Total Cholesterol: 156 mg/dL    Depression screen [facility] 2/9 10/30/2020 06/18/2020 05/29/2020  Decreased Interest 2 0 1  Down, Depressed, Hopeless _2 PHQ - 2 Score _3 Altered sleeping 2 0 1  Tired, decreased energy 2 0 1  Change in appetite _4 Feeling bad or failure about yourself  1 0 1  Trouble concentrating 1 0 0  Moving slowly or fidgety/restless 0 0 0  Suicidal thoughts 0 0 0  PHQ-9 Score _5 Difficult doing work/chores Somewhat difficult - -  Some recent data might be hidden    GAD 7 : Generalized Anxiety Score 10/30/2020 06/18/2020 05/29/2020 05/09/2020  Nervous, Anxious, on Edge - _6 Control/stop worrying _7 Worry too much - different things 0 0 1 3  Trouble relaxing 0 _8 Restless 0 0 1 1  Easily annoyed or irritable 0 0 0 1  Afraid - awful might happen 0 0 0 0  Total GAD 7 Score - _9 Anxiety  Difficulty Not difficult at all - - -    Social History   Tobacco Use  Smoking Status Never  Smokeless Tobacco Never   BP Readings from Last 3 Encounters:  12/11/20 (!) 160/90  11/16/20 (!) 157/80  11/06/20 (!) 173/77   Pulse Readings from Last 3 Encounters:  12/11/20 87  11/16/20 76  11/06/20 73   Wt Readings from Last 3 Encounters:  12/11/20 132 lb 2 oz (59.9 kg)  11/14/20 132 lb (59.9 kg)  11/06/20 132 lb (59.9 kg)   BMI Readings from Last 3 Encounters:  12/11/20 23.40 kg/m  11/14/20 23.38 kg/m  11/06/20 23.38 kg/m    Assessment/Interventions: Review of patient past medical history, allergies, medications, health status, including review of consultants reports, laboratory and other test data, was performed as part of comprehensive evaluation and provision of chronic care management services.   SDOH:  (Social Determinants of Health) assessments and interventions performed: No   SDOH Screenings   Alcohol Screen: Not on file  Depression (PHQ2-9): Medium Risk   PHQ-2 Score: 12  Financial Resource Strain: Low Risk    Difficulty of Paying Living Expenses: Not very hard  Food Insecurity: Not on file  Housing: Not on file  Physical Activity: Not on file  Social Connections: Not on file  Stress: Not on file  Tobacco Use: Low Risk    Smoking Tobacco Use: Never   Smokeless Tobacco Use: Never   Passive Exposure: Not on file  Transportation Needs: Not on file    [place]  Allergies  Allergen Reactions   Sulfa Antibiotics Nausea Only   Crestor [Rosuvastatin Calcium] Other (See Comments)    Leg ache    Medications Reviewed Today     Reviewed by Charlton Haws, [facility] (Pharmacist) on 06/07/21 at 1123  Med List Status: <None>   Medication Order Taking? Sig Documenting Provider Last Dose Status Informant  Accu-Chek FastClix Lancets MISC [id number] Yes USE AS INSTRUCTED TO CHECK BLOOD SUGAR ONCE DAILY [name], MD Taking Active Family Member   acetaminophen (TYLENOL) 325 MG tablet [id number] Yes Take 650 mg by mouth every 6 (six) hours as needed for moderate pain or headache. [provider] Taking Active Family Member  amLODipine (NORVASC) 2.5 MG tablet [id number] Yes Take 1 tablet (2.5 mg total) by mouth daily. [name], MD Taking Active   Blood Glucose Monitoring Suppl (ACCU-CHEK GUIDE) w/Device KIT [id number] Yes 1 each by Does not apply route as directed. Use as instructed to check blood sugar once daily [name], MD Taking Active Family Member  clonazePAM [facility]) 0.5 MG tablet [id number] Yes Take 1 tablet (0.5 mg total) by mouth daily as needed for anxiety (sedation precautions). [name], MD Taking Active   dapagliflozin propanediol (FARXIGA) 5 MG TABS tablet [id number] No Take 1 tablet (5 mg total) by mouth daily before breakfast.  Patient not taking: Reported on 06/07/2021   [name], MD Not Taking Active            Med Note Charlton Haws   Fri Jun 07, 2021 11:23 AM) Never started - fear of yeast infections  denosumab (PROLIA) 60 MG/ML SOSY injection [id number] Yes Inject 60 mg into the skin every 6 (six) months. [name], MD Taking Active Family Member  ezetimibe (ZETIA) 10 MG tablet [id number] Yes TAKE ONE TABLET BY MOUTH ONCE DAILY Jerline Pain, MD Taking Active   glucose blood (ONETOUCH VERIO) test strip [id number] Yes Use as instructed to check blood sugar once a day [name], MD Taking Active   levothyroxine (SYNTHROID) 50 MCG tablet [id number] Yes Take 1 tablet (50 mcg total) by mouth daily. [name], MD Taking Active Family Member  loperamide (IMODIUM A-D) 2 MG tablet [id number] Yes Take 2 mg by mouth 4 (four) times daily as needed for diarrhea or loose stools. [provider] Taking Active Family Member  mirtazapine (REMERON) 15 MG tablet  [id number] Yes Take 1 tablet (15 mg total) by mouth at bedtime. [name], MD Taking Active  Family Member  nitroGLYCERIN (NITROLINGUAL) 0.4 MG/SPRAY spray [id number] Yes Place 1 spray under the tongue every 5 (five) minutes as needed.  Patient taking differently: Place 1 spray under the tongue every 5 (five) minutes as needed for chest pain.   Jerline Pain, MD Taking Active   pravastatin (PRAVACHOL) 80 MG tablet [id number] Yes TAKE ONE TABLET BY MOUTH EVERYDAY AT BEDTIME Jerline Pain, MD Taking Active   venlafaxine XR (EFFEXOR-XR) 150 MG 24 hr capsule [id number] Yes TAKE ONE TABLET BY MOUTH EVERY MORNING [name], MD Taking Active             Patient Active Problem List   Diagnosis Date Noted   Memory impairment 01/16/2021   Elevated blood pressure reading without diagnosis of hypertension 12/11/2020   Status post right hip replacement 12/87/8676   Complicated UTI (urinary tract infection) 10/31/2020   Pre-op evaluation 10/03/2020   Sensorineural hearing loss, bilateral 07/07/2020   Osteoarthritis of right hip 09/17/2019   Diarrhea 07/26/2019   Recurrent falls 09/15/2017   Tongue lesion 02/12/2017   Osteoarthritis 72/12/4707   Complicated grief 62/83/6629   Health maintenance examination 02/07/2016   Baker's cyst of knee 08/01/2015   Diabetes mellitus type 2, controlled, with complications ([place]) 47/65/4650   Subclavian steal syndrome    Medicare annual wellness visit, subsequent 01/31/2015   Advanced care planning/counseling discussion 01/31/2015   Carotid stenosis 01/31/2015   Vertebral artery stenosis/occlusion 01/31/2015   CKD stage 3 due to type 2 diabetes mellitus ([place]) 01/21/2015   Microscopic colitis 10/26/2014   Osteoporosis    Hypothyroidism    Dermatomyositis ([place])    MDD (major depressive disorder), recurrent severe, without psychosis ([place]) 01/12/2014   Atherosclerosis of native coronary artery of native heart without angina pectoris 12/13/2013   Hyperlipidemia associated with type 2 diabetes mellitus ([place]) 12/13/2013   Pseudocyst of pancreas  08/29/2011    Immunization History  Administered Date(s) Administered   Fluad Quad(high Dose 65+) 12/28/2018   Influenza, High Dose Seasonal PF 12/18/2017, 12/23/2018, 01/23/2020   Influenza,inj,Quad PF,6+ Mos 01/22/2015, 02/04/2016, 12/19/2016   Influenza-Unspecified 04/01/2011   Moderna SARS-COV2 Booster Vaccination 02/24/2020   Moderna Sars-Covid-2 Vaccination 04/29/2019, 05/27/2019   Pfizer Covid-19 Vaccine Bivalent Booster 50yr & up 01/03/2021   Pneumococcal Conjugate-13 02/04/2016   Pneumococcal-Unspecified 03/24/2007   Td 03/24/2007, 09/05/2016   Tdap 09/09/2017, 01/25/2018, 09/12/2019   Zoster Recombinat (Shingrix) 02/20/2017, 05/02/2017   Zoster, Live 04/14/2009    Conditions to be addressed/monitored:  Hypertension, Hyperlipidemia, Diabetes, Coronary Artery Disease, Chronic Kidney Disease, Depression, and Osteoporosis  Care Plan : CCM Pharmacy Care Plan  Updates made by FCharlton Haws R[place]st[place]ince 06/07/2021 12:00 AM     Problem: Hypertension, Hyperlipidemia, Diabetes, Coronary Artery Disease, Chronic Kidney Disease, Depression, and Osteoporosis   Priority: High     Long-Range Goal: Disease mgmt   Start Date: 08/01/2020  Expected End Date: 06/07/2022  This Visit's Progress: On track  Priority: High  Note:   Current Barriers:  Unable to independently monitor therapeutic efficacy Suboptimal therapeutic regimen for BP/CKD  Pharmacist Clinical Goal(s):  Patient will achieve adherence to monitoring guidelines and medication adherence to achieve therapeutic efficacy adhere to plan to optimize therapeutic regimen for HTN/CKD as evidenced by report of adherence to recommended medication management changes through collaboration with PharmD and provider.   Interventions:  1:1 collaboration with GRia Bush MD regarding development and update  of comprehensive plan of care as evidenced by provider attestation and co-signature  Inter-disciplinary care team  collaboration (see longitudinal plan of care)  Comprehensive medication review performed; medication list updated in electronic medical record  Elevated BP (no HTN dx) (BP goal <130/80 w/ DM, CKD) -Uncontrolled - pt is not checking BP at home; BP in office was elevated though she has not been seen since July 2022 when she was ill; Denies hypotensive/hypertensive symptoms -Current home BP readings: n/a (does not have a cuff) -Current treatment: Amlodipine 2.5 mg daily - Appropriate, Query Effective -Medications previously tried: metoprolol  -Educated on BP goals and benefits of medications for prevention of heart attack, stroke and kidney damage; Importance of home blood pressure monitoring; -Counseled to monitor BP at pharmacy/grocery periodically, document, and provide log at future appointments -Recommended to continue current medication; consider ACE/ARB for renal protection depending on next BMP (do not start at same time as [place])- pt has upcoming PCP visit 06/18/21  Hyperlipidemia/CAD: (LDL goal < 70) -Controlled - adequate, LDL 77; pt is compliant with medications in pill pack -Hx CAD - CABGx5 2003 -Current treatment: Pravastatin 80 mg daily at bedtime - Appropriate, Effective, Safe, Accessible Ezetimibe 10 mg daily at breakfast  -Appropriate, Effective, Safe, Accessible OTC QoQ10 100 mg daily-  Appropriate, Effective, Safe, Accessible OTC Aspirin EC 81 mg daily - Appropriate, Effective, Safe, Accessible -Medications previously tried: Crestor, niacin  -Recommended to continue current medication  Diabetes (A1c goal <7%) -Controlled - A1c 6.7 (09/2020) when patient was taking Rybelsus 3 mg; she was changed to [facility] July 2022 due to cost and kidney benefits, but per patient report she never started this due to fear of UTI side effects; she has continued to receive Farxiga from [facility] and now has 6+ month supply on hand -Current BG readings: not available during visit today -Current  medications: Farxiga 5 mg daily (PAP) - not taking due to fear of side effects (UTI/yeast infections) -Medications previously tried: Geneticist, molecular (cost), Rybelsus, avoided metformin due to GFR -Discussed benefits of Farxiga for kidney protection, BP benefits as well as DM; discussed risk for UTI/mycotic infections increases with increasing A1c; since patient is seeing PCP in< 2 weeks, will continue to hold off on starting Farxiga, depending on A1c/BMP result may consider starting Farxiga   Depression/Anxiety (Goal: Control symptoms) -Controlled, patient has been doing a lot better lately -Current treatment: Mirtazapine 15 mg daily HS - Appropriate, Effective, Safe, Accessible Venlafaxine XR 150 mg daily AM - Appropriate, Effective, Safe, Accessible Clonazepam 0.5 mg only PRN - Appropriate, Effective, Safe, Accessible -Medications previously tried/failed: Citalopram -Recommend to continue current medication  Osteoporosis (Goal prevent fractures) -Not ideally controlled - pt is overdue for Prolia dose (due 01/2021) -Last DEXA Scan: 06/24/19   T-Score femoral neck: -2.7  T-Score total hip: -2.4  T-Score lumbar spine: -2.1 -Patient is a candidate for pharmacologic treatment due to T-Score < -2.5 in femoral neck -Current treatment  Prolia q6 months (started 09/02/17, last dose 08/03/20) -Medications previously tried: none  -Recommend to continue current medication; Prolia dose to be given 3/7 @ PCP appt  Health Maintenance -Vaccine gaps: Flu, PPSV or PCV20. Updated chart with covid booster 01/03/21. -Medicare adherence: significantly improved with pill packs  Patient Goals/Self-Care Activities Patient will:  - take medications as prescribed as evidenced by patient report and record review focus on medication adherence by pill packs check glucose 3x weekly, document, and provide at future appointments check blood pressure periodically at pharmacy/grocery, document, and provide at  future  appointments     Medication Assistance: None required.  Patient affirms current coverage meets needs.   Compliance/Adherence/Medication fill history: Care Gaps: Mammogram (due 06/23/20) A1c (due 04/02/21) Foot exam (due 05/27/19)  Star-Rating Drugs: Pravastatin - PDC 100%  Patient's preferred pharmacy is: [facility] - [place], [place] - [address] [place] [address] Phone: [phone] Fax: [phone]  Starting adherence packaging on 10/25/20, delivered today.  Care Plan and Follow Up Patient Decision:  Patient agrees to Care Plan and Follow-up.  Follow Up Plan: Telephone follow up appointment with care management team member scheduled for: 2 months  Charlene Brooke, PharmD, [facility] Clinical Pharmacist [facility] [phone]

## 2021-06-10 ENCOUNTER — Telehealth: Payer: PPO

## 2021-06-10 DIAGNOSIS — M810 Age-related osteoporosis without current pathological fracture: Secondary | ICD-10-CM | POA: Diagnosis not present

## 2021-06-10 LAB — BASIC METABOLIC PANEL
BUN: 20 (ref 4–21)
Creatinine: 1.5 — AB (ref 0.5–1.1)
Glucose: 145
Potassium: 4.2 mEq/L (ref 3.5–5.1)
Sodium: 138 (ref 137–147)

## 2021-06-10 LAB — VITAMIN D 25 HYDROXY (VIT D DEFICIENCY, FRACTURES): Vit D, 25-Hydroxy: 27

## 2021-06-10 LAB — COMPREHENSIVE METABOLIC PANEL
Calcium: 9.5 (ref 8.7–10.7)
eGFR: 36

## 2021-06-11 DIAGNOSIS — E118 Type 2 diabetes mellitus with unspecified complications: Secondary | ICD-10-CM

## 2021-06-11 DIAGNOSIS — F332 Major depressive disorder, recurrent severe without psychotic features: Secondary | ICD-10-CM | POA: Diagnosis not present

## 2021-06-11 DIAGNOSIS — E1122 Type 2 diabetes mellitus with diabetic chronic kidney disease: Secondary | ICD-10-CM | POA: Diagnosis not present

## 2021-06-11 DIAGNOSIS — M810 Age-related osteoporosis without current pathological fracture: Secondary | ICD-10-CM | POA: Diagnosis not present

## 2021-06-11 DIAGNOSIS — E785 Hyperlipidemia, unspecified: Secondary | ICD-10-CM

## 2021-06-11 DIAGNOSIS — I2510 Atherosclerotic heart disease of native coronary artery without angina pectoris: Secondary | ICD-10-CM | POA: Diagnosis not present

## 2021-06-11 DIAGNOSIS — E1169 Type 2 diabetes mellitus with other specified complication: Secondary | ICD-10-CM | POA: Diagnosis not present

## 2021-06-11 DIAGNOSIS — N183 Chronic kidney disease, stage 3 unspecified: Secondary | ICD-10-CM | POA: Diagnosis not present

## 2021-06-18 ENCOUNTER — Encounter: Payer: Self-pay | Admitting: Family Medicine

## 2021-06-18 ENCOUNTER — Ambulatory Visit (INDEPENDENT_AMBULATORY_CARE_PROVIDER_SITE_OTHER): Payer: PPO | Admitting: Family Medicine

## 2021-06-18 ENCOUNTER — Other Ambulatory Visit: Payer: Self-pay

## 2021-06-18 VITALS — BP 124/66 | HR 92 | Temp 97.7°F | Ht 63.0 in | Wt 130.2 lb

## 2021-06-18 DIAGNOSIS — E1122 Type 2 diabetes mellitus with diabetic chronic kidney disease: Secondary | ICD-10-CM

## 2021-06-18 DIAGNOSIS — M154 Erosive (osteo)arthritis: Secondary | ICD-10-CM | POA: Insufficient documentation

## 2021-06-18 DIAGNOSIS — M3390 Dermatopolymyositis, unspecified, organ involvement unspecified: Secondary | ICD-10-CM

## 2021-06-18 DIAGNOSIS — Z96641 Presence of right artificial hip joint: Secondary | ICD-10-CM | POA: Diagnosis not present

## 2021-06-18 DIAGNOSIS — M79642 Pain in left hand: Secondary | ICD-10-CM | POA: Insufficient documentation

## 2021-06-18 DIAGNOSIS — I10 Essential (primary) hypertension: Secondary | ICD-10-CM | POA: Diagnosis not present

## 2021-06-18 DIAGNOSIS — R413 Other amnesia: Secondary | ICD-10-CM

## 2021-06-18 DIAGNOSIS — E118 Type 2 diabetes mellitus with unspecified complications: Secondary | ICD-10-CM

## 2021-06-18 DIAGNOSIS — N183 Chronic kidney disease, stage 3 unspecified: Secondary | ICD-10-CM

## 2021-06-18 DIAGNOSIS — M79641 Pain in right hand: Secondary | ICD-10-CM | POA: Diagnosis not present

## 2021-06-18 DIAGNOSIS — M810 Age-related osteoporosis without current pathological fracture: Secondary | ICD-10-CM

## 2021-06-18 DIAGNOSIS — F332 Major depressive disorder, recurrent severe without psychotic features: Secondary | ICD-10-CM

## 2021-06-18 LAB — POCT GLYCOSYLATED HEMOGLOBIN (HGB A1C): Hemoglobin A1C: 7.1 % — AB (ref 4.0–5.6)

## 2021-06-18 MED ORDER — DENOSUMAB 60 MG/ML ~~LOC~~ SOSY
60.0000 mg | PREFILLED_SYRINGE | Freq: Once | SUBCUTANEOUS | Status: AC
  Administered 2021-06-18: 60 mg via SUBCUTANEOUS

## 2021-06-18 MED ORDER — VITAMIN D3 25 MCG (1000 UT) PO CAPS: 1.0000 | ORAL_CAPSULE | Freq: Every day | ORAL | Status: DC

## 2021-06-18 NOTE — Patient Instructions (Addendum)
Prolia injection today.  ?We will request latest diabetic eye exam from Dr Dingeldein.  ?I'd like you have to have blood work done in 1 week to repeat kidney function and vitamin D.  ?Return in 3 months for wellness visit/physical.  ?"Eating on the Wild Side" Arrie Eastern.  ? ?    Mediterranean Diet ? ?Why follow it? Research shows? ?Those who follow the Mediterranean diet have a reduced risk of heart disease  ?The diet is associated with a reduced incidence of Parkinson's and Alzheimer's diseases ?People following the diet may have longer life expectancies and lower rates of chronic diseases  ?The Dietary Guidelines for Americans recommends the Mediterranean diet as an eating plan to promote health and prevent disease ? ?What Is the Mediterranean Diet?  ?Healthy eating plan based on typical foods and recipes of Mediterranean-style cooking ?The diet is primarily a plant based diet; these foods should make up a majority of meals  ? ?Starches - Plant based foods should make up a majority of meals ?- They are an important sources of vitamins, minerals, energy, antioxidants, and fiber ?- Choose whole grains, foods high in fiber and minimally processed items  ?- Typical grain sources include wheat, oats, barley, corn, brown rice, bulgar, farro, millet, polenta, couscous  ?- Various types of beans include chickpeas, lentils, fava beans, black beans, white beans   ?Fruits  Veggies - Large quantities of antioxidant rich fruits & veggies; 6 or more servings  ?- Vegetables can be eaten raw or lightly drizzled with oil and cooked  ?- Vegetables common to the traditional Mediterranean Diet include: artichokes, arugula, beets, broccoli, brussel sprouts, cabbage, carrots, celery, collard greens, cucumbers, eggplant, kale, leeks, lemons, lettuce, mushrooms, okra, onions, peas, peppers, potatoes, pumpkin, radishes, rutabaga, shallots, spinach, sweet potatoes, turnips, zucchini ?- Fruits common to the Mediterranean Diet include:  apples, apricots, avocados, cherries, clementines, dates, figs, grapefruits, grapes, melons, nectarines, oranges, peaches, pears, pomegranates, strawberries, tangerines  ?Fats - Replace butter and margarine with healthy oils, such as olive oil, canola oil, and tahini  ?- Limit nuts to no more than a handful a day  ?- Nuts include walnuts, almonds, pecans, pistachios, pine nuts  ?- Limit or avoid candied, honey roasted or heavily salted nuts ?- Olives are central to the Mediterranean diet - can be eaten whole or used in a variety of dishes   ?Meats Protein - Limiting red meat: no more than a few times a month ?- When eating red meat: choose lean cuts and keep the portion to the size of deck of cards ?- Eggs: approx. 0 to 4 times a week  ?- Fish and lean poultry: at least 2 a week  ?- Healthy protein sources include, chicken, [place], lean beef, lamb ?- Increase intake of seafood such as tuna, salmon, trout, mackerel, shrimp, scallops ?- Avoid or limit high fat processed meats such as sausage and bacon  ?Dairy - Include moderate amounts of low fat dairy products  ?- Focus on healthy dairy such as fat free yogurt, skim milk, low or reduced fat cheese ?- Limit dairy products higher in fat such as whole or 2% milk, cheese, ice cream  ?Alcohol - Moderate amounts of red wine is ok  ?- No more than 5 oz daily for women (all ages) and men older than age 31  ?- No more than 10 oz of wine daily for men younger than 19  ?Other - Limit sweets and other desserts  ?- Use herbs and spices instead  of salt to flavor foods  ?- Herbs and spices common to the traditional Mediterranean Diet include: basil, bay leaves, chives, cloves, cumin, fennel, garlic, lavender, marjoram, mint, oregano, parsley, pepper, rosemary, sage, savory, sumac, tarragon, thyme  ? ?It?s not just a diet, it?s a lifestyle:  ?The Mediterranean diet includes lifestyle factors typical of those in the region  ?Foods, drinks and meals are best eaten with others and  savored ?Daily physical activity is important for overall good health ?This could be strenuous exercise like running and aerobics ?This could also be more leisurely activities such as walking, housework, yard-work, or taking the stairs ?Moderation is the key; a balanced and healthy diet accommodates most foods and drinks ?Consider portion sizes and frequency of consumption of certain foods  ? ?Meal Ideas & Options:  ?Breakfast:  ?Whole wheat toast or whole wheat English muffins with peanut butter & hard boiled egg ?Steel cut oats topped with apples & cinnamon and skim milk  ?Fresh fruit: banana, strawberries, melon, berries, peaches  ?Smoothies: strawberries, bananas, greek yogurt, peanut butter ?Low fat greek yogurt with blueberries and granola  ?Egg white omelet with spinach and mushrooms ?Breakfast couscous: whole wheat couscous, apricots, skim milk, cranberries  ?Sandwiches:  ?Hummus and grilled vegetables (peppers, zucchini, squash) on whole wheat bread   ?Grilled chicken on whole wheat pita with lettuce, tomatoes, cucumbers or tzatziki  ?Tuna salad on whole wheat bread: tuna salad made with greek yogurt, olives, red peppers, capers, green onions ?Garlic rosemary lamb pita: lamb saut?ed with garlic, rosemary, salt & pepper; add lettuce, cucumber, greek yogurt to pita - flavor with lemon juice and black pepper  ?Seafood:  ?Mediterranean grilled salmon, seasoned with garlic, basil, parsley, lemon juice and black pepper ?Shrimp, lemon, and spinach whole-grain pasta salad made with low fat greek yogurt  ?Seared scallops with lemon orzo  ?Seared tuna steaks seasoned salt, pepper, coriander topped with tomato mixture of olives, tomatoes, olive oil, minced garlic, parsley, green onions and cappers  ?Meats:  ?Herbed greek chicken salad with kalamata olives, cucumber, feta  ?Red bell peppers stuffed with spinach, bulgur, lean ground beef (or lentils) & topped with feta   ?Kebabs: skewers of chicken, tomatoes, onions,  zucchini, squash  ?[place] burgers: made with red onions, mint, dill, lemon juice, feta cheese topped with roasted red peppers ?Vegetarian ?Cucumber salad: cucumbers, artichoke hearts, celery, red onion, feta cheese, tossed in olive oil & lemon juice  ?Hummus and whole grain pita points with a greek salad (lettuce, tomato, feta, olives, cucumbers, red onion) ?Lentil soup with celery, carrots made with vegetable broth, garlic, salt and pepper  ?Tabouli salad: parsley, bulgur, mint, scallions, cucumbers, tomato, radishes, lemon juice, olive oil, salt and pepper.  ?

## 2021-06-18 NOTE — Progress Notes (Signed)
Patient ID: Kathleen Williamson, female    DOB: [birth year], 81 y.o.   MRN: [id number]  This visit was conducted in person.  BP 124/66    Pulse 92    Temp 97.7 F (36.5 C) (Temporal)    Ht 5' 3"  (1.6 m)    Wt 130 lb 4 oz (59.1 kg)    LMP  (LMP Unknown)    SpO2 97%    BMI 23.07 kg/m    CC: DM f/u visit  Subjective:   HPI: Kathleen Williamson is a 81 y.o. female presenting on 06/18/2021 for Diabetes (Wants to discuss [place]. )   Last medicare wellness visit 06/2020.  Period early 2022 where she had UTI that affected memory. She is overall feeling better now.   OP - currently off calcium, vitamin D. Advised she start 1000 IU vit D3 daily.   Recovered very well from R hip replacement 11/2020 ([facility]).   Never started farxiga. She does have 6 month supply at home. Will hold off on refill for now. She would prefer not to take this due to h/o yeast infections in the past.   She continues fighting depression. She continues remeron 27m daily along with effexor XR 1542mdaily. Denies SI/HI. Struggling with relation with one of her neighbors.   Over the past month she notices bilateral hand pain and stiffness into wrists. No redness or warmth of the joints. She does notice chronic joint swelling especially at PIPs. This happens every morning- feels better after running hot water over her hands. Also notes some paresthesias to fingers, none in feet. Mother had rheumatoid arthritis.   HTN - bp well controlled today. She has been monitoring BP although she has machine. Continues amlodipine 2.35m52maily.   DM - does not regularly check sugars. Compliant with antihyperglycemic regimen which includes: diet controlled - but she struggles with this. She is interested in [employer] low sugars or hypoglycemic symptoms. Denies paresthesias, blurry vision. Last diabetic eye exam 05/2020 - seen 04/2020. Glucometer brand: one touch verio. Last foot exam: 05/2018. DSME: [email or web address].  Lab Results  Component  Value Date   HGBA1C 7.1 (A) 06/18/2021   Diabetic Foot Exam - Simple   Simple Foot Form Diabetic Foot exam was performed with the following findings: Yes 06/18/2021  3:00 PM  Visual Inspection No deformities, no ulcerations, no other skin breakdown bilaterally: Yes Sensation Testing Intact to touch and monofilament testing bilaterally: Yes Pulse Check Posterior Tibialis and Dorsalis pulse intact bilaterally: Yes Comments    Lab Results  Component Value Date   MICROALBUR 5.7 (H) 06/18/2020        Relevant past medical, surgical, family and social history reviewed and updated as indicated. Interim medical history since our last visit reviewed. Allergies and medications reviewed and updated. Outpatient Medications Prior to Visit  Medication Sig Dispense Refill   Accu-Chek FastClix Lancets MISC USE AS INSTRUCTED TO CHECK BLOOD SUGAR ONCE DAILY 102 each 1   acetaminophen (TYLENOL) 325 MG tablet Take 650 mg by mouth every 6 (six) hours as needed for moderate pain or headache.     amLODipine (NORVASC) 2.5 MG tablet Take 1 tablet (2.5 mg total) by mouth daily. 90 tablet 1   Blood Glucose Monitoring Suppl (ACCU-CHEK GUIDE) w/Device KIT 1 each by Does not apply route as directed. Use as instructed to check blood sugar once daily 1 kit 0   clonazePAM (KLONOPIN) 0.5 MG tablet Take 1 tablet (0.5 mg total) by  mouth daily as needed for anxiety (sedation precautions). 30 tablet 0   denosumab (PROLIA) 60 MG/ML SOSY injection Inject 60 mg into the skin every 6 (six) months.     ezetimibe (ZETIA) 10 MG tablet TAKE ONE TABLET BY MOUTH ONCE DAILY 30 tablet 8   glucose blood (ONETOUCH VERIO) test strip Use as instructed to check blood sugar once a day 100 each 3   levothyroxine (SYNTHROID) 50 MCG tablet Take 1 tablet (50 mcg total) by mouth daily. 90 tablet 3   loperamide (IMODIUM A-D) 2 MG tablet Take 2 mg by mouth 4 (four) times daily as needed for diarrhea or loose stools.     mirtazapine (REMERON) 15  MG tablet TAKE ONE TABLET BY MOUTH EVERYDAY AT BEDTIME 30 tablet 6   nitroGLYCERIN (NITROLINGUAL) 0.4 MG/SPRAY spray Place 1 spray under the tongue every 5 (five) minutes as needed. (Patient taking differently: Place 1 spray under the tongue every 5 (five) minutes as needed for chest pain.) 12 g prn   pravastatin (PRAVACHOL) 80 MG tablet TAKE ONE TABLET BY MOUTH EVERYDAY AT BEDTIME 30 tablet 6   venlafaxine XR (EFFEXOR-XR) 150 MG 24 hr capsule TAKE ONE TABLET BY MOUTH EVERY MORNING 90 capsule 0   dapagliflozin propanediol (FARXIGA) 5 MG TABS tablet Take 1 tablet (5 mg total) by mouth daily before breakfast. 90 tablet 3   No facility-administered medications prior to visit.     Per HPI unless specifically indicated in ROS section below Review of Systems  Objective:  BP 124/66    Pulse 92    Temp 97.7 F (36.5 C) (Temporal)    Ht 5' 3"  (1.6 m)    Wt 130 lb 4 oz (59.1 kg)    LMP  (LMP Unknown)    SpO2 97%    BMI 23.07 kg/m   Wt Readings from Last 3 Encounters:  06/18/21 130 lb 4 oz (59.1 kg)  12/11/20 132 lb 2 oz (59.9 kg)  11/14/20 132 lb (59.9 kg)      Physical Exam Vitals and nursing note reviewed.  Constitutional:      Appearance: Normal appearance. She is not ill-appearing.  Eyes:     Extraocular Movements: Extraocular movements intact.     Conjunctiva/sclera: Conjunctivae normal.     Pupils: Pupils are equal, round, and reactive to light.  Cardiovascular:     Rate and Rhythm: Normal rate and regular rhythm.     Pulses: Normal pulses.     Heart sounds: Normal heart sounds. No murmur heard. Pulmonary:     Effort: Pulmonary effort is normal. No respiratory distress.     Breath sounds: Normal breath sounds. No wheezing, rhonchi or rales.  Musculoskeletal:        General: Swelling and deformity present. No tenderness.     Right lower leg: No edema.     Left lower leg: No edema.     Comments: Thickened PIPs R hand, nodules present to DIP L 2nd finger, ulnar  of digits R>L   Skin:    General: Skin is warm and dry.     Findings: No rash.  Neurological:     Mental Status: She is alert.  Psychiatric:        Mood and Affect: Mood normal.        Behavior: Behavior normal.      Results for orders placed or performed in visit on 06/18/21  POCT glycosylated hemoglobin (Hb A1C)  Result Value Ref Range   Hemoglobin A1C 7.1 (  A) 4.0 - 5.6 %   HbA1c POC (<> result, manual entry)     HbA1c, POC (prediabetic range)     HbA1c, POC (controlled diabetic range)     Depression screen [facility] 2/9 10/30/2020 06/18/2020 05/29/2020 05/09/2020 09/16/2019  Decreased Interest 2 0 1 1 0  Down, Depressed, Hopeless 2 1 1 3  0  PHQ - 2 Score 4 1 2 4  0  Altered sleeping 2 0 1 1 0  Tired, decreased energy 2 0 1 1 0  Change in appetite 2 1 2  0 0  Feeling bad or failure about yourself  1 0 1 0 0  Trouble concentrating 1 0 0 0 0  Moving slowly or fidgety/restless 0 0 0 0 0  Suicidal thoughts 0 0 0 1 0  PHQ-9 Score 12 2 7 7  0  Difficult doing work/chores Somewhat difficult - - - -  Some recent data might be hidden    GAD 7 : Generalized Anxiety Score 10/30/2020 06/18/2020 05/29/2020 05/09/2020  Nervous, Anxious, on Edge - 1 1 2   Control/stop worrying 1 1 1 3   Worry too much - different things 0 0 1 3  Trouble relaxing 0 1 1 2   Restless 0 0 1 1  Easily annoyed or irritable 0 0 0 1  Afraid - awful might happen 0 0 0 0  Total GAD 7 Score - 3 5 12   Anxiety Difficulty Not difficult at all - - -   Assessment & Plan:  This visit occurred during the SARS-CoV-2 public health emergency.  Safety protocols were in place, including screening questions prior to the visit, additional usage of staff PPE, and extensive cleaning of exam room while observing appropriate contact time as indicated for disinfecting solutions.   Problem List Items Addressed This Visit     MDD (major depressive disorder), recurrent severe, without psychosis ([place])    Overall stable period to slightly worsening - no changes made  to current regimen of effexor XR 148m and remeron 113mnightly, but would consider increasing efffexor dose at f/u if needed.       Osteoporosis    Last prolia injection was 07/2020.  Repeat prolia injection today.  Advised restart vitamin D 1000 IU daily.  In CKD, advised recheck labs in 1 week (Cr, Ca, vit D) - [facility]      Relevant Medications   Cholecalciferol (VITAMIN D3) 25 MCG (1000 UT) CAPS   Dermatomyositis (HCC)    Update CPK levels.       CKD stage 3 due to type 2 diabetes mellitus ([facility]   [facility] Will ask to repeat renal panel 1 wk after today's Prolia injection.  She declines SGLT2i.       Diabetes mellitus type 2, controlled, with complications ([facility]    Chronic, overall stable period with A1c today 7.1%.  She desires to stay off farxiga at this time due to concern over recurrent yeast infection.  Continue diet control - reviewed diabetic diet, I suggested she read "Eating on the Wild Side" by JoArrie Eastern    Relevant Orders   POCT glycosylated hemoglobin (Hb A1C) (Completed)   Status post right hip replacement    Recovered remarkably well ([facility])      Hypertension    BP well controlled today only on amlodipine 2.107m68maily. Consider transition to ARB if kidney function remains impaired.  Memory impairment    This has resolved - she attributes previous memory difficulty to complicated UTI.       Pain in both hands    Acutely worse over the past month. She endorses fmhx rheumatoid arthritis (mother).  Evidence of both OA (heberden's nodes) and RA (ulnar deviation). Check ESR and RF when she gets blood drawn at [facility] next week.         Meds ordered this encounter  Medications   denosumab (PROLIA) injection 60 mg    Order Specific Question:   Patient is enrolled in REMS program for this medication and I have provided a copy of the Prolia Medication  Guide and Patient Brochure.    Answer:   No    Order Specific Question:   I have reviewed with the patient the information in the Prolia Medication Guide and Patient Counseling Chart including the serious risks of Prolia and symptoms of each risk.    Answer:   No    Order Specific Question:   I have advised the patient to seek medical attention if they have signs or symptoms of any of the serious risks.    Answer:   No   Cholecalciferol (VITAMIN D3) 25 MCG (1000 UT) CAPS    Sig: Take 1 capsule (1,000 Units total) by mouth daily.    Dispense:  30 capsule   Orders Placed This Encounter  Procedures   POCT glycosylated hemoglobin (Hb A1C)     Patient instructions: Prolia injection today.  We will request latest diabetic eye exam from Dr Dingeldein.  I'd like you have to have blood work done in 1 week to repeat kidney function and vitamin D.  Return in 3 months for wellness visit/physical.  "Eating on the Wild Side" Arrie Eastern.   Follow up plan: Return in about 3 months (around 09/18/2021) for annual exam, prior fasting for blood work.  [name], MD

## 2021-06-19 NOTE — Assessment & Plan Note (Addendum)
Latest GFR 36 (06/10/2021) at [facility].  ?Will ask to repeat renal panel 1 wk after today's Prolia injection.  ?She declines SGLT2i.  ?

## 2021-06-19 NOTE — Assessment & Plan Note (Addendum)
Chronic, overall stable period with A1c today 7.1%.  ?She desires to stay off farxiga at this time due to concern over recurrent yeast infection.  ?Continue diet control - reviewed diabetic diet, I suggested she read "Eating on the Wild Side" by Arrie Eastern ?

## 2021-06-19 NOTE — Assessment & Plan Note (Signed)
This has resolved - she attributes previous memory difficulty to complicated UTI.  ?

## 2021-06-19 NOTE — Assessment & Plan Note (Signed)
Update CPK levels.  ?

## 2021-06-19 NOTE — Assessment & Plan Note (Addendum)
Acutely worse over the past month. She endorses fmhx rheumatoid arthritis (mother).  ?Evidence of both OA (heberden's nodes) and RA (ulnar deviation). ?Check ESR and RF when she gets blood drawn at [facility] next week.  ?

## 2021-06-19 NOTE — Assessment & Plan Note (Signed)
BP well controlled today only on amlodipine 2.'5mg'$  daily. Consider transition to ARB if kidney function remains impaired.  ?

## 2021-06-19 NOTE — Assessment & Plan Note (Signed)
Recovered remarkably well (Kathleen Williamson) ?

## 2021-06-19 NOTE — Assessment & Plan Note (Addendum)
Overall stable period to slightly worsening - no changes made to current regimen of effexor XR '150mg'$  and remeron '15mg'$  nightly, but would consider increasing efffexor dose at f/u if needed.  ?

## 2021-06-19 NOTE — Assessment & Plan Note (Signed)
Last prolia injection was 07/2020.  ?Repeat prolia injection today.  ?Advised restart vitamin D 1000 IU daily.  ?In CKD, advised recheck labs in 1 week (Cr, Ca, vit D) - written Rx provided for her to take to [facility].  ?

## 2021-06-24 ENCOUNTER — Encounter: Payer: Self-pay | Admitting: Family Medicine

## 2021-06-25 NOTE — Telephone Encounter (Signed)
CrCl was  28.37 mL/min and calcium normal at 9.5 from labs on 06/10/21. ?Patient was seen by Dr Darnell Level on 06/18/21 and was given Prolia injection at that time.  ?

## 2021-06-28 ENCOUNTER — Encounter: Payer: Self-pay | Admitting: Family Medicine

## 2021-07-04 ENCOUNTER — Encounter: Payer: Self-pay | Admitting: Family Medicine

## 2021-07-04 DIAGNOSIS — E1122 Type 2 diabetes mellitus with diabetic chronic kidney disease: Secondary | ICD-10-CM | POA: Diagnosis not present

## 2021-07-04 DIAGNOSIS — M79641 Pain in right hand: Secondary | ICD-10-CM | POA: Diagnosis not present

## 2021-07-05 ENCOUNTER — Telehealth: Payer: Self-pay | Admitting: Family Medicine

## 2021-07-05 DIAGNOSIS — M3390 Dermatopolymyositis, unspecified, organ involvement unspecified: Secondary | ICD-10-CM

## 2021-07-05 DIAGNOSIS — M79641 Pain in right hand: Secondary | ICD-10-CM

## 2021-07-05 NOTE — Telephone Encounter (Signed)
Plz notify - labs overall stable. Kidney function remains impaired but stable after prolia injection. Vitamin D levels are already improving on replacement - continue this.  ?Dermatomyositis blood test returned normal.  ?Rheumatoid arthritis and inflammatory test returned normal.  ?If worsening hand pain, would offer coming in for xrays and consider referral to rheumatologist.  ? ?I gave labs to Terri for scanning.  ?

## 2021-07-09 ENCOUNTER — Other Ambulatory Visit: Payer: Self-pay | Admitting: Family Medicine

## 2021-07-09 NOTE — Telephone Encounter (Signed)
Patient notified as instructed by telephone and verbalized understanding. Patient stated that her hands are in such bad shape that it is hard to use them. Patient stated that her hands are numb all the way up to her wrist and hurt. Patient stated that she does not know what is going on with her hands but does need something done about it. ?

## 2021-07-09 NOTE — Addendum Note (Signed)
Addended by: [name] on: 07/09/2021 04:52 PM ? ? Modules accepted: Orders ? ?

## 2021-07-09 NOTE — Telephone Encounter (Signed)
Left a message on voicemail to call the office back. ?

## 2021-07-09 NOTE — Telephone Encounter (Signed)
Spoke with pt relaying Dr. G's message.  Pt verbalizes understanding and expresses her thanks.  

## 2021-07-09 NOTE — Telephone Encounter (Signed)
I've ordered hand xrays - she may come in at her convenience. ?I've placed referral to rheumatology.  ?

## 2021-07-10 ENCOUNTER — Telehealth: Payer: Self-pay

## 2021-07-10 ENCOUNTER — Ambulatory Visit (INDEPENDENT_AMBULATORY_CARE_PROVIDER_SITE_OTHER)
Admission: RE | Admit: 2021-07-10 | Discharge: 2021-07-10 | Disposition: A | Discharge status: Home / Self Care | Payer: PPO | Source: Ambulatory Visit | Attending: Family Medicine | Admitting: Family Medicine

## 2021-07-10 DIAGNOSIS — M79641 Pain in right hand: Secondary | ICD-10-CM | POA: Diagnosis not present

## 2021-07-10 DIAGNOSIS — M79642 Pain in left hand: Secondary | ICD-10-CM | POA: Diagnosis not present

## 2021-07-10 NOTE — Chronic Care Management (AMB) (Signed)
? ? ?Chronic Care Management ?Pharmacy Assistant  ? ?Name: THRESSA SHIFFER  MRN: [id number] DOB: July 17, 1940 ? ?Reason for Encounter: Medication Adherence and Delivery Coordination  ?  ? ?Recent office visits:  ?06/18/21-PCP-Javier Gutierrez,MD- Follow up diabetes-Advised she start 1000 IU vit D3 daily. Repeat prolia injection today. Future labs ordered-She desires to stay off farxiga at this time due to concern over recurrent yeast infection.  ?Consider transition to ARB if kidney function remains impaired.  ? ?Recent consult visits:  ?None since last CCM contact ? ?Hospital visits:  ?None in previous 6 months ? ?Medications: ?Outpatient Encounter Medications as of 07/10/2021  ?Medication Sig Note  ? Accu-Chek FastClix Lancets MISC USE AS INSTRUCTED TO CHECK BLOOD SUGAR ONCE DAILY   ? acetaminophen (TYLENOL) 325 MG tablet Take 650 mg by mouth every 6 (six) hours as needed for moderate pain or headache.   ? amLODipine (NORVASC) 2.5 MG tablet Take 1 tablet (2.5 mg total) by mouth daily.   ? Blood Glucose Monitoring Suppl (ACCU-CHEK GUIDE) w/Device KIT 1 each by Does not apply route as directed. Use as instructed to check blood sugar once daily   ? Cholecalciferol (VITAMIN D3) 25 MCG (1000 UT) CAPS Take 1 capsule (1,000 Units total) by mouth daily.   ? clonazePAM (KLONOPIN) 0.5 MG tablet Take 1 tablet (0.5 mg total) by mouth daily as needed for anxiety (sedation precautions).   ? denosumab (PROLIA) 60 MG/ML SOSY injection Inject 60 mg into the skin every 6 (six) months. 06/07/2021: Last injection 08/03/20  ? ezetimibe (ZETIA) 10 MG tablet TAKE ONE TABLET BY MOUTH ONCE DAILY   ? glucose blood (ONETOUCH VERIO) test strip Use as instructed to check blood sugar once a day   ? levothyroxine (SYNTHROID) 50 MCG tablet TAKE ONE TABLET BY MOUTH ONCE DAILY   ? loperamide (IMODIUM A-D) 2 MG tablet Take 2 mg by mouth 4 (four) times daily as needed for diarrhea or loose stools.   ? mirtazapine (REMERON) 15 MG tablet TAKE ONE TABLET BY  MOUTH EVERYDAY AT BEDTIME   ? nitroGLYCERIN (NITROLINGUAL) 0.4 MG/SPRAY spray Place 1 spray under the tongue every 5 (five) minutes as needed. (Patient taking differently: Place 1 spray under the tongue every 5 (five) minutes as needed for chest pain.)   ? pravastatin (PRAVACHOL) 80 MG tablet TAKE ONE TABLET BY MOUTH EVERYDAY AT BEDTIME   ? venlafaxine XR (EFFEXOR-XR) 150 MG 24 hr capsule TAKE ONE TABLET BY MOUTH EVERY MORNING   ? ?No facility-administered encounter medications on file as of 07/10/2021.  ? ?BP Readings from Last 3 Encounters:  ?06/18/21 124/66  ?12/11/20 (!) 160/90  ?11/16/20 (!) 157/80  ?  ?Lab Results  ?Component Value Date  ? HGBA1C 7.1 (A) 06/18/2021  ?  ? ? ?Recent OV, Consult or Hospital visit: 06/18/21-PCP-Javier Gutierrez,MD- Follow up diabetes-Advised she start 1000 IU vit D3 daily. Repeat prolia injection today. Future labs ordered-She desires to stay off farxiga at this time due to concern over recurrent yeast infection.  ?Consider transition to ARB if kidney function remains impaired. ? ? ? ?Last adherence delivery date:06/20/21     ? ?Patient is due for next adherence delivery on: 07/22/21 ? ?Spoke with patient on 07/10/21 reviewed medications and coordinated delivery. ? ?This delivery to include: Adherence Packaging  30 Days  ?Packs: ?Co Q10 100 mg- take 1 tablet bedtime ?Venlafaxine ER 150 mg - take 1 tablet breakfast ?Mirtazapine 15 mg - take 1 tablet bedtime ?Pravastatin 80 mg - take 1  tablet bedtime ?Levothyroxine 50 mcg - take 1 tablet breakfast ?Aspirin 81 mg - take 1 tablet breakfast ?Ezetimibe 10 mg - take 1 tablet breakfast ?Amlodipine 2.5 mg - take 1 tablet by mouth daily breakfast ? ?VIAL medications: ?none ? ? ?Patient declined the following medications this month: ? ? ? ?Any concerns about your medications? No ? ?How often do you forget or accidentally miss a dose? Never ? ?Is patient in packaging Yes ? What is the date on your next pill pack?07/11/21 ? Any concerns or issues with  your packaging? Patient very happy with process ? ? ?Refills requested from providers include:levothyroxine ? ?Confirmed delivery date of 07/22/21, advised patient that pharmacy will contact them the morning of delivery. ? ?Recent blood pressure readings are as follows: recent office BP normal limits  ? ?Annual wellness visit in last year? Yes ?Most Recent BP reading:124/66  92-P  06/18/21 ? ? ? ?Charlene Brooke, CPP notified ? ?Velmena Humble, CCMA ?Health concierge  ?[phone]  ?

## 2021-07-12 ENCOUNTER — Encounter: Payer: Self-pay | Admitting: Family Medicine

## 2021-07-29 ENCOUNTER — Telehealth: Payer: Self-pay

## 2021-07-29 NOTE — Chronic Care Management (AMB) (Signed)
? ? ?  Chronic Care Management ?Pharmacy Assistant  ? ?Name: Kathleen Williamson  MRN: [id number] DOB: 12-13-1940 ? ?Reason for Encounter: Reminder Call ?  ?Conditions to be addressed/monitored: ?HTN, HLD, DMII, and CKD Stage 3 ? ? ?Medications: ?Outpatient Encounter Medications as of 07/29/2021  ?Medication Sig Note  ? Accu-Chek FastClix Lancets MISC USE AS INSTRUCTED TO CHECK BLOOD SUGAR ONCE DAILY   ? acetaminophen (TYLENOL) 325 MG tablet Take 650 mg by mouth every 6 (six) hours as needed for moderate pain or headache.   ? amLODipine (NORVASC) 2.5 MG tablet Take 1 tablet (2.5 mg total) by mouth daily.   ? Blood Glucose Monitoring Suppl (ACCU-CHEK GUIDE) w/Device KIT 1 each by Does not apply route as directed. Use as instructed to check blood sugar once daily   ? Cholecalciferol (VITAMIN D3) 25 MCG (1000 UT) CAPS Take 1 capsule (1,000 Units total) by mouth daily.   ? clonazePAM (KLONOPIN) 0.5 MG tablet Take 1 tablet (0.5 mg total) by mouth daily as needed for anxiety (sedation precautions).   ? denosumab (PROLIA) 60 MG/ML SOSY injection Inject 60 mg into the skin every 6 (six) months. 06/07/2021: Last injection 08/03/20  ? ezetimibe (ZETIA) 10 MG tablet TAKE ONE TABLET BY MOUTH ONCE DAILY   ? glucose blood (ONETOUCH VERIO) test strip Use as instructed to check blood sugar once a day   ? levothyroxine (SYNTHROID) 50 MCG tablet TAKE ONE TABLET BY MOUTH ONCE DAILY   ? loperamide (IMODIUM A-D) 2 MG tablet Take 2 mg by mouth 4 (four) times daily as needed for diarrhea or loose stools.   ? mirtazapine (REMERON) 15 MG tablet TAKE ONE TABLET BY MOUTH EVERYDAY AT BEDTIME   ? nitroGLYCERIN (NITROLINGUAL) 0.4 MG/SPRAY spray Place 1 spray under the tongue every 5 (five) minutes as needed. (Patient taking differently: Place 1 spray under the tongue every 5 (five) minutes as needed for chest pain.)   ? pravastatin (PRAVACHOL) 80 MG tablet TAKE ONE TABLET BY MOUTH EVERYDAY AT BEDTIME   ? venlafaxine XR (EFFEXOR-XR) 150 MG 24 hr capsule  TAKE ONE TABLET BY MOUTH EVERY MORNING   ? ?No facility-administered encounter medications on file as of 07/29/2021.  ? ?Kathleen Williamson was contacted to remind of upcoming telephone visit with Charlene Brooke on 07/30/21 at 11:00am. Patient was reminded to have any blood glucose and blood pressure readings available for review at appointment.  ? ?Patient confirmed appointment. ? ? ?Are you having any problems with your medications? No  ? ?Do you have any concerns you like to discuss with the pharmacist? Yes concerns of ongoing bilat hand pain with no help from voltaren gel  ? ? ? ?Star Rating Drugs: ?Medication:  Last Fill: Day Supply ?Pravastatin 80mg  06/15/21  30   [facility] ? ?Charlene Brooke, CPP notified ? ?Nishi Neiswonger, CCMA ?Health concierge  ?[phone]  ?

## 2021-07-30 ENCOUNTER — Ambulatory Visit (INDEPENDENT_AMBULATORY_CARE_PROVIDER_SITE_OTHER): Payer: PPO | Admitting: Pharmacist

## 2021-07-30 DIAGNOSIS — M810 Age-related osteoporosis without current pathological fracture: Secondary | ICD-10-CM

## 2021-07-30 DIAGNOSIS — E118 Type 2 diabetes mellitus with unspecified complications: Secondary | ICD-10-CM

## 2021-07-30 DIAGNOSIS — I2510 Atherosclerotic heart disease of native coronary artery without angina pectoris: Secondary | ICD-10-CM

## 2021-07-30 DIAGNOSIS — I10 Essential (primary) hypertension: Secondary | ICD-10-CM

## 2021-07-30 DIAGNOSIS — E1169 Type 2 diabetes mellitus with other specified complication: Secondary | ICD-10-CM

## 2021-07-30 DIAGNOSIS — E1122 Type 2 diabetes mellitus with diabetic chronic kidney disease: Secondary | ICD-10-CM

## 2021-07-30 NOTE — Addendum Note (Signed)
Addended by: [name] on: 07/30/2021 05:05 PM ? ? Modules accepted: Orders ? ?

## 2021-07-30 NOTE — Telephone Encounter (Signed)
For Rheumatology referral - Pt declines going to [facility] office. She would prefer to go to [facility] in [place] if able. ?

## 2021-07-30 NOTE — Progress Notes (Signed)
? ?Chronic Care Management ?Pharmacy Note ? ?08/09/2021 ?Name:  Kathleen Williamson MRN:  [id number] DOB:  08-21-1940 ? ?Summary: CCM F/U visit ?-Medication adherence significantly improved with pill packs/delivery ?-Pt reports significant hand pain, small improvement with Tylenol 500 mg. She received call from [place] but declined appt due to personal preference to avoid [facility] hospitals/clinics. She would prefer to go to [facility] in [place]. ? ?Recommendations: ?-Coordinate with PCP for new Rheumatology referral to [facility] ? ?Plan: ?-[place] will call patient monthly for pill pack coordination ?-Pharmacist follow up televisit scheduled for 6 months ?-PCP visit 09/17/21 ([facility]) ? ? ? ?Subjective: ?Kathleen Williamson is an 81 y.o. year old female who is a primary patient of [name], MD.  The CCM team was consulted for assistance with disease management and care coordination needs.   ? ?Engaged with patient by telephone or follow up visit in response to provider referral for pharmacy case management and/or care coordination services.  ? ?Consent to Services:  ?The patient was given information about Chronic Care Management services, agreed to services, and gave verbal consent prior to initiation of services.  Please see initial visit note for detailed documentation.  ? ?Patient Care Team: ?[name], MD as PCP - General (Family Medicine) ?Jerline Pain, MD as PCP - Cardiology (Cardiology) ?Estill Cotta, MD as Referring Physician (Ophthalmology) ?Erminie Foulks, Cleaster Corin, [facility] as Pharmacist (Pharmacist) ? ? ?Recent office visits: ?07/05/21 phone note - c/o hand pain. Ordered Xrays. Referred to Rheumatology (fam hx RA) ?06/18/21 Dr Darnell Level OV: DM f/u; A1c 7.1%; consider ARB if GFR remains impaired; stay off Farxiga. ?Repeat renal panel, Vit D, ESR 1 wk at [facility]. Start Vitamin D 1000 IU. ? ?10/30/20 Dr Darnell Level OV: f/u UTI; change Klonopin to 0.5 mg 1 tablet only PRN.  Sent to [facility]. ?10/01/20- PCP - Presented for pre-op eval, Drop klonopin dose to 1/2 tablet in the morning and 1 tablet at night time - do this for at least a week and monitor effect on balance. If mood is doing well, may stop the morning dose altogether. ? ?Recent consult visits: ?None since last CCM visit  ? ?Hospital visits: ?ED - 10/06/20 - Confusion, thought to be chronic process, follow up with neurology ?ED - 07/27/20 - Generalized weakness x 2 weeks, fall. BG 315, not on any medications for diabetes. Work up unremarkable. Follow up with PCP for diabetes management. ? ?Objective: ? ?Lab Results  ?Component Value Date  ? CREATININE 1.5 (A) 06/10/2021  ? BUN 20 06/10/2021  ? GFR 39.68 (L) 10/01/2020  ? GFRNONAA 47 (L) 11/16/2020  ? GFRAA 46 (L) 01/31/2014  ? NA 138 06/10/2021  ? K 4.2 06/10/2021  ? CALCIUM 9.5 06/10/2021  ? CO2 21 (L) 11/16/2020  ? GLUCOSE 153 (H) 11/16/2020  ? ? ?Lab Results  ?Component Value Date/Time  ? HGBA1C 7.1 (A) 06/18/2021 02:36 PM  ? HGBA1C 6.7 (H) 10/01/2020 12:11 PM  ? HGBA1C 7.6 (H) 06/18/2020 10:55 AM  ? GFR 39.68 (L) 10/01/2020 12:11 PM  ? GFR 32.40 (L) 06/18/2020 10:55 AM  ? MICROALBUR 5.7 (H) 06/18/2020 10:55 AM  ? MICROALBUR 0.7 02/18/2019 09:44 AM  ?  ?Last diabetic Eye exam:  ?Lab Results  ?Component Value Date/Time  ? HMDIABEYEEXA No Retinopathy 05/27/2021 12:00 AM  ? HMDIABEYEEXA No Retinopathy 05/27/2021 12:00 AM  ?  ?Last diabetic Foot exam:  09/27/2018 normal  ? ?Lab Results  ?Component Value Date  ? CHOL 156 02/18/2019  ?  HDL 39.30 02/18/2019  ? [place] 77 02/18/2019  ? LDLDIRECT 77.0 06/18/2020  ? TRIG 194.0 (H) 02/18/2019  ? CHOLHDL 4 02/18/2019  ? ? ? ?  Latest Ref Rng & Units 11/06/2020  ?  1:44 PM 10/06/2020  ?  6:06 PM 10/01/2020  ? 12:11 PM  ?Hepatic Function  ?Total Protein 6.5 - 8.1 g/dL 7.0   7.2   6.7    ?Albumin 3.5 - 5.0 g/dL 4.2   3.9   4.3    ?AST 15 - 41 U/L 16   25   11     ?ALT 0 - 44 U/L 13   15   11     ?Alk Phosphatase 38 - 126 U/L 50   64   64     ?Total Bilirubin 0.3 - 1.2 mg/dL 0.6   0.6   0.4    ? ? ?Lab Results  ?Component Value Date/Time  ? TSH 1.378 10/06/2020 06:06 PM  ? TSH 1.10 10/01/2020 12:11 PM  ? TSH 0.346 (L) 07/28/2020 12:26 AM  ? TSH 0.96 06/18/2020 10:55 AM  ? FREET4 1.03 10/06/2020 06:06 PM  ? FREET4 0.93 10/01/2020 12:11 PM  ? ? ? ?  Latest Ref Rng & Units 11/16/2020  ?  3:19 AM 11/15/2020  ?  3:23 AM 11/06/2020  ?  1:44 PM  ?CBC  ?WBC 4.0 - 10.5 K/uL 9.9   9.8   8.8    ?Hemoglobin 12.0 - 15.0 g/dL 10.1   10.6   11.6    ?Hematocrit 36.0 - 46.0 % 32.6   33.8   37.6    ?Platelets 150 - 400 K/uL 204   253   305    ? ? ?Lab Results  ?Component Value Date/Time  ? VD25OH 27 06/10/2021 12:00 AM  ? VD25OH 29.07 (L) 06/18/2020 10:55 AM  ? VD25OH 35.78 09/16/2019 02:42 PM  ? ? ?Clinical ASCVD: Yes  ?The 10-year ASCVD risk score (Arnett DK, et al., 2019) is: 47.6% ?  Values used to calculate the score: ?    Age: 81 years ?    Sex: Female ?    Is Non-Hispanic African American: No ?    Diabetic: Yes ?    Tobacco smoker: No ?    Systolic Blood Pressure: 597 mmHg ?    Is BP treated: Yes ?    HDL Cholesterol: 39.3 mg/dL ?    Total Cholesterol: 156 mg/dL   ? ? ?  10/30/2020  ?  2:46 PM 06/18/2020  ? 11:17 AM 05/29/2020  ?  2:36 PM  ?Depression screen PHQ 2/9  ?Decreased Interest 2 0 1  ?Down, Depressed, Hopeless 2 1 1   ?PHQ - 2 Score 4 1 2   ?Altered sleeping 2 0 1  ?Tired, decreased energy 2 0 1  ?Change in appetite 2 1 2   ?Feeling bad or failure about yourself  1 0 1  ?Trouble concentrating 1 0 0  ?Moving slowly or fidgety/restless 0 0 0  ?Suicidal thoughts 0 0 0  ?PHQ-9 Score 12 2 7   ?Difficult doing work/chores Somewhat difficult    ?  ? ?  10/30/2020  ?  2:46 PM 06/18/2020  ? 11:18 AM 05/29/2020  ?  2:37 PM 05/09/2020  ?  1:00 PM  ?GAD 7 : Generalized Anxiety Score  ?Nervous, Anxious, on Edge  1 1 2   ?Control/stop worrying 1 1 1 3   ?Worry too much - different things 0 0 1 3  ?Trouble relaxing 0 1  1 2  ?Restless 0 0 1 1  ?Easily annoyed or irritable 0 0 0 1  ?Afraid  - awful might happen 0 0 0 0  ?Total GAD 7 Score  3 5 12   ?Anxiety Difficulty Not difficult at all     ? ? ?Social History  ? ?Tobacco Use  ?Smoking Status Never  ?Smokeless Tobacco Never  ? ?BP Readings from Last 3 Encounters:  ?06/18/21 124/66  ?12/11/20 (!) 160/90  ?11/16/20 (!) 157/80  ? ?Pulse Readings from Last 3 Encounters:  ?06/18/21 92  ?12/11/20 87  ?11/16/20 76  ? ?Wt Readings from Last 3 Encounters:  ?06/18/21 130 lb 4 oz (59.1 kg)  ?12/11/20 132 lb 2 oz (59.9 kg)  ?11/14/20 132 lb (59.9 kg)  ? ?BMI Readings from Last 3 Encounters:  ?06/18/21 23.07 kg/m?  ?12/11/20 23.40 kg/m?  ?11/14/20 23.38 kg/m?  ? ? ?Assessment/Interventions: Review of patient past medical history, allergies, medications, health status, including review of consultants reports, laboratory and other test data, was performed as part of comprehensive evaluation and provision of chronic care management services.  ? ?SDOH:  (Social Determinants of Health) assessments and interventions performed: No ? ? ?SDOH Screenings  ? ?Alcohol Screen: Not on file  ?Depression (PHQ2-9): Medium Risk  ? PHQ-2 Score: 12  ?Financial Resource Strain: Low Risk   ? Difficulty of Paying Living Expenses: Not very hard  ?Food Insecurity: Not on file  ?Housing: Not on file  ?Physical Activity: Not on file  ?Social Connections: Not on file  ?Stress: Not on file  ?Tobacco Use: Low Risk   ? Smoking Tobacco Use: Never  ? Smokeless Tobacco Use: Never  ? Passive Exposure: Not on file  ?Transportation Needs: Not on file  ? ? ?[place] ? ?Allergies  ?Allergen Reactions  ? Sulfa Antibiotics Nausea Only  ? Crestor [Rosuvastatin Calcium] Other (See Comments)  ?  Leg ache  ? ? ?Medications Reviewed Today   ? ? Reviewed by Charlton Haws, RPH (Pharmacist) on 07/30/21 at 33  Med List Status: <None>  ? ?Medication Order Taking? Sig Documenting Provider Last Dose Status Informant  ?Accu-Chek FastClix Lancets MISC [id number] Yes USE AS INSTRUCTED TO CHECK BLOOD SUGAR  ONCE DAILY [name], MD Taking Active Family Member  ?acetaminophen (TYLENOL) 325 MG tablet [id number] Yes Take 650 mg by mouth every 6 (six) hours as needed for moderate pain or headache. Provide

## 2021-07-30 NOTE — Telephone Encounter (Signed)
Noted  

## 2021-07-31 ENCOUNTER — Telehealth: Payer: Self-pay | Admitting: Pharmacist

## 2021-07-31 NOTE — Telephone Encounter (Signed)
-----   Message from [place], [place] sent at 07/31/2021  9:19 AM EDT ----- ?Regarding: Wilder Glade discontinue ?Contacted [employer] to discontinue future deliveries from the manufacturer on Farxiga . Patient is no longer taking this medication. ? ?Charlene Brooke, CPP notified ? ?Velmena Humble, CCMA ?Health concierge  ?[phone]  ? ?

## 2021-08-01 ENCOUNTER — Telehealth: Payer: PPO

## 2021-08-07 ENCOUNTER — Other Ambulatory Visit: Payer: Self-pay | Admitting: Cardiology

## 2021-08-07 ENCOUNTER — Other Ambulatory Visit: Payer: Self-pay | Admitting: Family Medicine

## 2021-08-08 ENCOUNTER — Telehealth: Payer: Self-pay

## 2021-08-08 NOTE — Chronic Care Management (AMB) (Signed)
? ? ?Chronic Care Management ?Pharmacy Assistant  ? ?Name: Kathleen Williamson  MRN: [id number] DOB: 19-Jan-1941 ? ? ?Reason for Encounter: Medication Adherence and Delivery Coordination ?  ? ?Recent office visits:  ?None since last CCM visit  ? ?Recent consult visits:  ?None since last CCM visit ? ?Hospital visits:  ?None in previous 6 months ? ?Medications: ?Outpatient Encounter Medications as of 08/08/2021  ?Medication Sig Note  ? Accu-Chek FastClix Lancets MISC USE AS INSTRUCTED TO CHECK BLOOD SUGAR ONCE DAILY   ? acetaminophen (TYLENOL) 325 MG tablet Take 650 mg by mouth every 6 (six) hours as needed for moderate pain or headache.   ? amLODipine (NORVASC) 2.5 MG tablet Take 1 tablet (2.5 mg total) by mouth daily.   ? Blood Glucose Monitoring Suppl (ACCU-CHEK GUIDE) w/Device KIT 1 each by Does not apply route as directed. Use as instructed to check blood sugar once daily   ? Cholecalciferol (VITAMIN D3) 25 MCG (1000 UT) CAPS Take 1 capsule (1,000 Units total) by mouth daily.   ? clonazePAM (KLONOPIN) 0.5 MG tablet Take 1 tablet (0.5 mg total) by mouth daily as needed for anxiety (sedation precautions).   ? denosumab (PROLIA) 60 MG/ML SOSY injection Inject 60 mg into the skin every 6 (six) months. 07/30/2021: Last injection 06/18/21  ? ezetimibe (ZETIA) 10 MG tablet Take 1 tablet (10 mg total) by mouth daily. PLEASE MAKE CARDIOLOGY APPOINTMENT FOR FURTHER REFILLS   ? glucose blood (ONETOUCH VERIO) test strip Use as instructed to check blood sugar once a day   ? levothyroxine (SYNTHROID) 50 MCG tablet TAKE ONE TABLET BY MOUTH ONCE DAILY   ? loperamide (IMODIUM A-D) 2 MG tablet Take 2 mg by mouth 4 (four) times daily as needed for diarrhea or loose stools.   ? mirtazapine (REMERON) 15 MG tablet TAKE ONE TABLET BY MOUTH EVERYDAY AT BEDTIME   ? nitroGLYCERIN (NITROLINGUAL) 0.4 MG/SPRAY spray Place 1 spray under the tongue every 5 (five) minutes as needed. (Patient taking differently: Place 1 spray under the tongue every 5  (five) minutes as needed for chest pain.)   ? pravastatin (PRAVACHOL) 80 MG tablet TAKE ONE TABLET BY MOUTH EVERYDAY AT BEDTIME   ? venlafaxine XR (EFFEXOR-XR) 150 MG 24 hr capsule TAKE ONE TABLET BY MOUTH EVERY MORNING   ? ?No facility-administered encounter medications on file as of 08/08/2021.  ? ? ?BP Readings from Last 3 Encounters:  ?06/18/21 124/66  ?12/11/20 (!) 160/90  ?11/16/20 (!) 157/80  ?  ?Lab Results  ?Component Value Date  ? HGBA1C 7.1 (A) 06/18/2021  ?  ? ? ?No OVs, Consults, or hospital visits since last care coordination call / Pharmacist visit. ?No medication changes indicated ? ? ?Last adherence delivery date:07/22/21     ? ?Patient is due for next adherence delivery on: 08/20/21 ? ?Spoke with patient on 08/09/21 reviewed medications and coordinated delivery. ? ?This delivery to include: Adherence Packaging  30 Days  ?Packs: ?Co Q10 100 mg- take 1 tablet bedtime ?Venlafaxine ER 150 mg - take 1 tablet breakfast ?Mirtazapine 15 mg - take 1 tablet bedtime ?Pravastatin 80 mg - take 1 tablet bedtime ?Levothyroxine 50 mcg - take 1 tablet breakfast ?Aspirin 81 mg - take 1 tablet breakfast ?Ezetimibe 10 mg - take 1 tablet breakfast ?Amlodipine 2.5 mg - take 1 tablet by mouth daily breakfast ? ?VIAL medications: ?none ? ?Patient declined the following medications this month: ?none ? ?Any concerns about your medications? No ? ?How often do you  forget or accidentally miss a dose? Never ? ?Is patient in packaging Yes ? If yes ? What is the date on your next pill pack?08/10/21 ? Any concerns or issues with your packaging? No concerns with the packaging  ? ? ?Refills requested from providers include:  ezetimibe, amlodipine,venlafaxine ER ? ?Confirmed delivery date of 08/20/21, advised patient that pharmacy will contact them the morning of delivery. ? ?Recent blood pressure readings are as follows:none available  ? ?Annual wellness visit in last year? Yes ?Most Recent BP reading:124/66 ? ? ?Charlene Brooke, CPP  notified ? ?[name], CCMA ?Health concierge  ?[phone]  ?

## 2021-08-09 NOTE — Patient Instructions (Signed)
Visit Information ? ?Phone number for Pharmacist: [phone] ? ? Goals Addressed   ?None ?  ? ? ?Care Plan : [place]  ?Updates made by Kathleen Williamson, [place] since 08/09/2021 12:00 AM  ?  ? ?Problem: Hypertension, Hyperlipidemia, Diabetes, Coronary Artery Disease, Chronic Kidney Disease, Depression, and Osteoporosis   ?Priority: High  ?  ? ?Long-Range Goal: Disease mgmt   ?Start Date: 08/01/2020  ?Expected End Date: 06/07/2022  ?This Visit's Progress: On track  ?Recent Progress: On track  ?Priority: High  ?Note:   ?Current Barriers:  ?Unable to independently monitor therapeutic efficacy ?Suboptimal therapeutic regimen for BP/CKD ? ?Pharmacist Clinical Goal(s):  ?Patient will achieve adherence to monitoring guidelines and medication adherence to achieve therapeutic efficacy ?adhere to plan to optimize therapeutic regimen for HTN/CKD as evidenced by report of adherence to recommended medication management changes through collaboration with PharmD and provider.  ? ?Interventions: ? 1:1 collaboration with Kathleen Bush, MD regarding development and update of comprehensive plan of care as evidenced by provider attestation and co-signature ? Inter-disciplinary care team collaboration (see longitudinal plan of care) ? Comprehensive medication review performed; medication list updated in electronic medical record ? ?Elevated BP (no HTN dx) (BP goal <130/80 w/ DM, CKD) ?-Controlled - BP is at goal in recent office visits ?-Current home BP readings: n/a (does not have a cuff) ?-Current treatment: ?Amlodipine 2.5 mg daily - Appropriate, Effective, Safe, Accessible ?-Medications previously tried: metoprolol  ?-Educated on BP goals and benefits of medications for prevention of heart attack, stroke and kidney damage; Importance of home blood pressure monitoring; ?-Counseled to monitor BP at pharmacy/grocery periodically, document, and provide log at future appointments ?-Recommended to continue current  medication ? ?Hyperlipidemia/CAD: (LDL goal < 70) ?-Controlled - adequate, LDL 77; pt is compliant with medications in pill pack ?-Hx CAD - CABGx5 2003 ?-Current treatment: ?Pravastatin 80 mg daily at bedtime - Appropriate, Effective, Safe, Accessible ?Ezetimibe 10 mg daily at breakfast  -Appropriate, Effective, Safe, Accessible ?OTC QoQ10 100 mg daily-  Appropriate, Effective, Safe, Accessible ?OTC Aspirin EC 81 mg daily - Appropriate, Effective, Safe, Accessible ?-Medications previously tried: Crestor, niacin  ?-Recommended to continue current medication ? ?Diabetes (A1c goal <8%) ?-Controlled - A1c 7.1% (06/2021), worsened from 6.7 (09/2020) when patient was taking Rybelsus 3 mg; pt has declined to take [place] due to fear of UTI side effects, we have discussed this previously and she would prefer to not take [place] ?-Current BG readings: not available during visit today ?-Current medications: ?None ?-Medications previously tried: [place] (cost), Rybelsus, avoided metformin due to GFR; [place] - fear of yeast infxn ?-Discussed A1c goal < 8% is reasonable given age and comorbidities, will continue to hold off on medication ? ?Depression/Anxiety (Goal: Control symptoms) ?-Controlled, patient has been doing a lot better lately ?-Current treatment: ?Mirtazapine 15 mg daily HS - Appropriate, Effective, Safe, Accessible ?Venlafaxine XR 150 mg daily AM - Appropriate, Effective, Safe, Accessible ?Clonazepam 0.5 mg only PRN - Appropriate, Effective, Safe, Accessible ?-Medications previously tried/failed: Citalopram ?-Recommend to continue current medication ? ?Osteoporosis (Goal prevent fractures) ?-Controlled ?-Last DEXA Scan: 06/24/19  ? T-Score femoral neck: -2.7 ? T-Score total hip: -2.4 ? T-Score lumbar spine: -2.1 ?-Patient is a candidate for pharmacologic treatment due to T-Score < -2.5 in femoral neck ?-Current treatment  ?Prolia q6 months (started 09/02/17, last dose 06/18/21) - Appropriate, Effective, Safe,  Accessible ? ?-Medications previously tried: none  ?-Recommend to continue current medication; next Prolia dose due 12/2021 ? ?Hand pain (Goal:  mange symptoms) ?-Uncontrolled - pt has been referred to [facility] for this, however she would prefer to go to [facility] ?-Current treatment  ?Tylenol 500 mg  ?Voltaren gel - ineffective ?Tramadol 50 mg - old Rx ?-Medications previously tried: n/a ? -Coordinate with PCP for new referral to preferred location ? ?Health Maintenance ?-Vaccine gaps: PPSV or PCV20 ?-Medication adherence: significantly improved with pill packs ? ?Patient Goals/Self-Care Activities ?Patient will:  ?- take medications as prescribed as evidenced by patient report and record review ?focus on medication adherence by pill packs ?check glucose 3x weekly, document, and provide at future appointments ?check blood pressure periodically at pharmacy/grocery, document, and provide at future appointments ?  ?  ? ?The patient verbalized understanding of instructions, educational materials, and care plan provided today and declined offer to receive copy of patient instructions, educational materials, and care plan.  ?Telephone follow up appointment with pharmacy team member scheduled for: 6 months ? ?Kathleen Williamson, PharmD, BCACP ?Clinical Pharmacist ?[facility] ?[phone] ?  ?

## 2021-08-11 DIAGNOSIS — F32A Depression, unspecified: Secondary | ICD-10-CM

## 2021-08-11 DIAGNOSIS — E785 Hyperlipidemia, unspecified: Secondary | ICD-10-CM | POA: Diagnosis not present

## 2021-08-11 DIAGNOSIS — M818 Other osteoporosis without current pathological fracture: Secondary | ICD-10-CM | POA: Diagnosis not present

## 2021-08-11 DIAGNOSIS — E1159 Type 2 diabetes mellitus with other circulatory complications: Secondary | ICD-10-CM

## 2021-08-11 DIAGNOSIS — I2510 Atherosclerotic heart disease of native coronary artery without angina pectoris: Secondary | ICD-10-CM | POA: Diagnosis not present

## 2021-08-12 DIAGNOSIS — F3340 Major depressive disorder, recurrent, in remission, unspecified: Secondary | ICD-10-CM | POA: Diagnosis not present

## 2021-08-12 DIAGNOSIS — E119 Type 2 diabetes mellitus without complications: Secondary | ICD-10-CM | POA: Diagnosis not present

## 2021-08-12 DIAGNOSIS — I10 Essential (primary) hypertension: Secondary | ICD-10-CM | POA: Diagnosis not present

## 2021-08-12 DIAGNOSIS — Z7984 Long term (current) use of oral hypoglycemic drugs: Secondary | ICD-10-CM | POA: Diagnosis not present

## 2021-08-21 ENCOUNTER — Encounter: Payer: Self-pay | Admitting: Family Medicine

## 2021-08-21 ENCOUNTER — Ambulatory Visit (INDEPENDENT_AMBULATORY_CARE_PROVIDER_SITE_OTHER): Payer: PPO | Admitting: Family Medicine

## 2021-08-21 VITALS — BP 132/74 | HR 87 | Temp 97.7°F | Ht 63.0 in | Wt 132.0 lb

## 2021-08-21 DIAGNOSIS — F332 Major depressive disorder, recurrent severe without psychotic features: Secondary | ICD-10-CM

## 2021-08-21 DIAGNOSIS — E1122 Type 2 diabetes mellitus with diabetic chronic kidney disease: Secondary | ICD-10-CM | POA: Diagnosis not present

## 2021-08-21 DIAGNOSIS — N183 Chronic kidney disease, stage 3 unspecified: Secondary | ICD-10-CM | POA: Diagnosis not present

## 2021-08-21 DIAGNOSIS — R200 Anesthesia of skin: Secondary | ICD-10-CM | POA: Diagnosis not present

## 2021-08-21 DIAGNOSIS — M154 Erosive (osteo)arthritis: Secondary | ICD-10-CM

## 2021-08-21 DIAGNOSIS — M3390 Dermatopolymyositis, unspecified, organ involvement unspecified: Secondary | ICD-10-CM | POA: Diagnosis not present

## 2021-08-21 DIAGNOSIS — E118 Type 2 diabetes mellitus with unspecified complications: Secondary | ICD-10-CM

## 2021-08-21 MED ORDER — BLACK PEPPER-TURMERIC 3-500 MG PO CAPS
1.0000 | ORAL_CAPSULE | Freq: Every day | ORAL | Status: DC
Start: 2021-08-21 — End: 2022-02-12

## 2021-08-21 MED ORDER — VENLAFAXINE HCL ER 75 MG PO CP24: 225.0000 mg | ORAL_CAPSULE | Freq: Every morning | ORAL | 1 refills | Status: DC

## 2021-08-21 NOTE — Patient Instructions (Addendum)
Labs today  ?We will place new referral to rheumatology in [place]  ?Increase effexor to '225mg'$  daily - new dose sent to pharmacy.  ?Keep physical appointment next month ?

## 2021-08-21 NOTE — Progress Notes (Signed)
? ? Patient ID: Kathleen Williamson, female    DOB: 01-26-1941, 81 y.o.   MRN: [id number] ? ?This visit was conducted in person. ? ?BP 132/74   Pulse 87   Temp 97.7 ?F (36.5 ?C) (Temporal)   Ht _0  (1.6 m)   Wt 132 lb (59.9 kg)   LMP  (LMP Unknown)   SpO2 98%   BMI 23.38 kg/m?   ? ?CC: depression follow up visit  ?Subjective:  ? ?HPI: ?Kathleen Williamson is a 81 y.o. female presenting on 08/21/2021 for Depression (Here for 2 mo f/u.) ? ? ?Longstanding depression, worse after husband's death. She is currently on effexor XR 116m daily as well as remeron 169mnightly. Also continues klonopin 0.81m22maily as needed.  ? ?She decided to stop FarIrane to concerns over recurrent yeast infections.  ? ?Ongoing worsening hand pains over the past 2 months. She thinks pain started after prolia injection. Tips of fingers distal to DIPs are going numb (bilateral, only 2nd-4th digits). She notes weakness of hands. She has been taking turmeric/black pepper once daily.  ?Bilateral hand xrays from 06/2021 showed severe arthritis changes, consistent with erosive osteoarthritis.  ?Hand pain is worsening depression.  ?She cannot hold phone more than 10-15 min at a time due to pain weakness and numbness of hands.  ? ?H/o dermatomyositis. Fmhx RA (mother) ?Notes worsening hand pains along with both heberden's nodes and ulnar deviation. ESR/RF normal on last check a few months ago.  ?   ? ?Relevant past medical, surgical, family and social history reviewed and updated as indicated. Interim medical history since our last visit reviewed. ?Allergies and medications reviewed and updated. ?Outpatient Medications Prior to Visit  ?Medication Sig Dispense Refill  ? Accu-Chek FastClix Lancets MISC USE AS INSTRUCTED TO CHECK BLOOD SUGAR ONCE DAILY 102 each 1  ? acetaminophen (TYLENOL) 325 MG tablet Take 650 mg by mouth every 6 (six) hours as needed for moderate pain or headache.    ? amLODipine (NORVASC) 2.5 MG tablet TAKE ONE TABLET BY MOUTH EVERY  MORNING 90 tablet 0  ? Blood Glucose Monitoring Suppl (ACCU-CHEK GUIDE) w/Device KIT 1 each by Does not apply route as directed. Use as instructed to check blood sugar once daily 1 kit 0  ? Cholecalciferol (VITAMIN D3) 25 MCG (1000 UT) CAPS Take 1 capsule (1,000 Units total) by mouth daily. 30 capsule   ? clonazePAM (KLONOPIN) 0.5 MG tablet Take 1 tablet (0.5 mg total) by mouth daily as needed for anxiety (sedation precautions). 30 tablet 0  ? denosumab (PROLIA) 60 MG/ML SOSY injection Inject 60 mg into the skin every 6 (six) months.    ? ezetimibe (ZETIA) 10 MG tablet Take 1 tablet (10 mg total) by mouth daily. PLEASE MAKE CARDIOLOGY APPOINTMENT FOR FURTHER REFILLS 30 tablet 0  ? glucose blood (ONETOUCH VERIO) test strip Use as instructed to check blood sugar once a day 100 each 3  ? levothyroxine (SYNTHROID) 50 MCG tablet TAKE ONE TABLET BY MOUTH ONCE DAILY 90 tablet 0  ? loperamide (IMODIUM A-D) 2 MG tablet Take 2 mg by mouth 4 (four) times daily as needed for diarrhea or loose stools.    ? mirtazapine (REMERON) 15 MG tablet TAKE ONE TABLET BY MOUTH EVERYDAY AT BEDTIME 30 tablet 6  ? nitroGLYCERIN (NITROLINGUAL) 0.4 MG/SPRAY spray Place 1 spray under the tongue every 5 (five) minutes as needed. (Patient taking differently: Place 1 spray under the tongue every 5 (five) minutes as needed for  chest pain.) 12 g prn  ? pravastatin (PRAVACHOL) 80 MG tablet TAKE ONE TABLET BY MOUTH EVERYDAY AT BEDTIME 30 tablet 6  ? venlafaxine XR (EFFEXOR-XR) 150 MG 24 hr capsule TAKE ONE TABLET BY MOUTH EVERY MORNING 90 capsule 0  ? ?No facility-administered medications prior to visit.  ?  ? ?Per HPI unless specifically indicated in ROS section below ?Review of Systems ? ?Objective:  ?BP 132/74   Pulse 87   Temp 97.7 ?F (36.5 ?C) (Temporal)   Ht _0  (1.6 m)   Wt 132 lb (59.9 kg)   LMP  (LMP Unknown)   SpO2 98%   BMI 23.38 kg/m?   ?Wt Readings from Last 3 Encounters:  ?08/21/21 132 lb (59.9 kg)  ?06/18/21 130 lb 4 oz (59.1 kg)   ?12/11/20 132 lb 2 oz (59.9 kg)  ?  ?  ?Physical Exam ?Vitals and nursing note reviewed.  ?Constitutional:   ?   Appearance: Normal appearance. She is not ill-appearing.  ?Musculoskeletal:  ?   Comments:  ?2+ radial pulses bilaterally ?R>L digit swelling, ulnar deviation, nodules present to PIP/DIPs  ?Skin: ?   General: Skin is warm and dry.  ?   Findings: No rash.  ?Neurological:  ?   Mental Status: She is alert.  ?   Sensory: Sensory deficit present.  ?   Comments:  ?Diminished sensation to 2nd-4th distal digits bilaterally ?Diminished grip strength  ?Neg tinel and phalen bilaterallya  ?Psychiatric:     ?   Mood and Affect: Mood normal.     ?   Behavior: Behavior normal.  ? ?   ?Results for orders placed or performed in visit on 06/28/21  ?HM DIABETES EYE EXAM  ?Result Value Ref Range  ? HM Diabetic Eye Exam No Retinopathy No Retinopathy  ? ?Lab Results  ?Component Value Date  ? HGBA1C 7.1 (A) 06/18/2021  ?  ? ?  08/21/2021  ?  2:37 PM 10/30/2020  ?  2:46 PM 06/18/2020  ? 11:17 AM 05/29/2020  ?  2:36 PM 05/09/2020  ?  1:00 PM  ?Depression screen PHQ 2/9  ?Decreased Interest 3 2 0 1 1  ?Down, Depressed, Hopeless _1 ?PHQ - 2 Score _2 ?Altered sleeping 2 2 0 1 1  ?Tired, decreased energy 2 2 0 1 1  ?Change in appetite _3 0  ?Feeling bad or failure about yourself  0 1 0 1 0  ?Trouble concentrating 2 1 0 0 0  ?Moving slowly or fidgety/restless 0 0 0 0 0  ?Suicidal thoughts 0 0 0 0 1  ?PHQ-9 Score _4 ?Difficult doing work/chores Somewhat difficult Somewhat difficult     ?  ? ?  08/21/2021  ?  2:38 PM 10/30/2020  ?  2:46 PM 06/18/2020  ? 11:18 AM 05/29/2020  ?  2:37 PM  ?GAD 7 : Generalized Anxiety Score  ?Nervous, Anxious, on Edge _5 ?Control/stop worrying _6 ?Worry too much - different things 1 0 0 1  ?Trouble relaxing 1 0 1 1  ?Restless 0 0 0 1  ?Easily annoyed or irritable 0 0 0 0  ?Afraid - awful might happen 0 0 0 0  ?Total GAD 7 Score _7 ?Anxiety Difficulty Somewhat difficult  Not difficult at all    ? ?Assessment & Plan:  ? ?Problem List Items  Addressed This Visit   ? ? MDD (major depressive disorder), recurrent severe, without psychosis ([place])  ?  Chronic, uncontrolled.  ?Increase effexor XR to full dose 266m.  ?Continue remeron 14mnightly, klonopin PRN.  ?Update with effect.  ? ?  ?  ? Relevant Medications  ? venlafaxine XR (EFFEXOR-XR) 75 MG 24 hr capsule  ? [place] ?  H/o this, resolved on its own.  ?Has seen Dr DeBronson Curb ?Update CPK levels.  ? ?  ?  ? Relevant Orders  ? Ambulatory referral to Rheumatology  ? CK  ? CKD stage 3 due to type 2 diabetes mellitus ([facility] ?  Update renal panel.  ? ?  ?  ? Relevant Orders  ? Renal function panel  ? Diabetes mellitus type 2, controlled, with complications ([place] ?  Chronic, traditionally diet controlled. She declined SGLT2i due to concern over side effects.  ? ?  ?  ? Erosive osteoarthritis of both hands  ?  Severe erosive osteoarthritis to hands based on xrays from 06/2021. Activity limiting which is worsening depression. See above.   ?She is managing pain with turmeric/black pepper supplement and tylenol PRN.  ?Will work towards return to rheum.  ?Update labs as per below.  ? ?  ?  ? Relevant Orders  ? Ambulatory referral to Rheumatology  ? Bilateral hand numbness - Primary  ?  Activity limiting, distressing symptoms.  ?Check labs r/o reversible causes of hand neuropathy/paresthesias.  ?Consider NCS.  ? ?  ?  ? Relevant Orders  ? Vitamin B12  ? Vitamin B1  ? TSH  ? CBC with Differential/Platelet  ? ANA  ? Serum protein electrophoresis with reflex  ? Sedimentation rate  ?  ? ?Meds ordered this encounter  ?Medications  ? Black Pepper-Turmeric (TURMERIC COMPLEX/BLACK PEPPER) 3-500 MG CAPS  ?  Sig: Take 1 capsule by mouth daily.  ? venlafaxine XR (EFFEXOR-XR) 75 MG 24 hr capsule  ?  Sig: Take 3 capsules (225 mg total) by mouth every morning.  ?  Dispense:  270 capsule  ?  Refill:  1  ? ?Orders Placed This Encounter   ?Procedures  ? Vitamin B12  ? Vitamin B1  ? TSH  ? CBC with Differential/Platelet  ? Renal function panel  ? ANA  ? Serum protein electrophoresis with reflex  ? Sedimentation rate  ? CK  ? Ambulatory referral to Rheumatology

## 2021-08-22 DIAGNOSIS — R200 Anesthesia of skin: Secondary | ICD-10-CM | POA: Insufficient documentation

## 2021-08-22 DIAGNOSIS — G5603 Carpal tunnel syndrome, bilateral upper limbs: Secondary | ICD-10-CM | POA: Insufficient documentation

## 2021-08-22 LAB — CBC WITH DIFFERENTIAL/PLATELET
Basophils Absolute: 0.1 10*3/uL (ref 0.0–0.1)
Basophils Relative: 1 % (ref 0.0–3.0)
Eosinophils Absolute: 0.4 10*3/uL (ref 0.0–0.7)
Eosinophils Relative: 4.8 % (ref 0.0–5.0)
HCT: 36 % (ref 36.0–46.0)
Hemoglobin: 11.5 g/dL — ABNORMAL LOW (ref 12.0–15.0)
Lymphocytes Relative: 26.7 % (ref 12.0–46.0)
Lymphs Abs: 2.4 10*3/uL (ref 0.7–4.0)
MCHC: 32 g/dL (ref 30.0–36.0)
MCV: 87.7 fl (ref 78.0–100.0)
Monocytes Absolute: 0.6 10*3/uL (ref 0.1–1.0)
Monocytes Relative: 6.9 % (ref 3.0–12.0)
Neutro Abs: 5.5 10*3/uL (ref 1.4–7.7)
Neutrophils Relative %: 60.6 % (ref 43.0–77.0)
Platelets: 332 10*3/uL (ref 150.0–400.0)
RBC: 4.1 Mil/uL (ref 3.87–5.11)
RDW: 15.8 % — ABNORMAL HIGH (ref 11.5–15.5)
WBC: 9.1 10*3/uL (ref 4.0–10.5)

## 2021-08-22 LAB — RENAL FUNCTION PANEL
Albumin: 4.2 g/dL (ref 3.5–5.2)
BUN: 24 mg/dL — ABNORMAL HIGH (ref 6–23)
CO2: 23 mEq/L (ref 19–32)
Calcium: 9.2 mg/dL (ref 8.4–10.5)
Chloride: 107 mEq/L (ref 96–112)
Creatinine, Ser: 1.56 mg/dL — ABNORMAL HIGH (ref 0.40–1.20)
GFR: 31.39 mL/min — ABNORMAL LOW (ref >60.00)
Glucose, Bld: 131 mg/dL — ABNORMAL HIGH (ref 70–99)
Phosphorus: 4.8 mg/dL — ABNORMAL HIGH (ref 2.3–4.6)
Potassium: 4.2 mEq/L (ref 3.5–5.1)
Sodium: 141 mEq/L (ref 135–145)

## 2021-08-22 LAB — TSH: TSH: 0.83 u[IU]/mL (ref 0.35–5.50)

## 2021-08-22 LAB — VITAMIN B12: Vitamin B-12: 247 pg/mL (ref 211–911)

## 2021-08-22 NOTE — Assessment & Plan Note (Signed)
Activity limiting, distressing symptoms.  ?Check labs r/o reversible causes of hand neuropathy/paresthesias.  ?Consider NCS.  ?

## 2021-08-22 NOTE — Assessment & Plan Note (Addendum)
Severe erosive osteoarthritis to hands based on xrays from 06/2021. Activity limiting which is worsening depression. See above.   ?She is managing pain with turmeric/black pepper supplement and tylenol PRN.  ?Will work towards return to rheum.  ?Update labs as per below.  ?

## 2021-08-22 NOTE — Assessment & Plan Note (Signed)
Chronic, uncontrolled.  ?Increase effexor XR to full dose '225mg'$ .  ?Continue remeron '15mg'$  nightly, klonopin PRN.  ?Update with effect.  ?

## 2021-08-22 NOTE — Assessment & Plan Note (Signed)
H/o this, resolved on its own.  ?Has seen Dr Bronson Curb.  ?Update CPK levels.  ?

## 2021-08-22 NOTE — Assessment & Plan Note (Addendum)
Chronic, traditionally diet controlled. She declined SGLT2i due to concern over side effects.  ?

## 2021-08-22 NOTE — Assessment & Plan Note (Signed)
Update renal panel 

## 2021-08-22 NOTE — Addendum Note (Signed)
Addended by: Ellamae Sia on: 08/22/2021 08:37 AM ? ? Modules accepted: Orders ? ?

## 2021-08-24 ENCOUNTER — Other Ambulatory Visit: Payer: Self-pay | Admitting: Family Medicine

## 2021-08-24 DIAGNOSIS — R768 Other specified abnormal immunological findings in serum: Secondary | ICD-10-CM | POA: Insufficient documentation

## 2021-08-24 DIAGNOSIS — N183 Chronic kidney disease, stage 3 unspecified: Secondary | ICD-10-CM

## 2021-08-26 LAB — PROTEIN ELECTROPHORESIS, SERUM, WITH REFLEX
Albumin ELP: 3.9 g/dL (ref 3.8–4.8)
Alpha 1: 0.4 g/dL — ABNORMAL HIGH (ref 0.2–0.3)
Alpha 2: 0.8 g/dL (ref 0.5–0.9)
Beta 2: 0.3 g/dL (ref 0.2–0.5)
Beta Globulin: 0.4 g/dL (ref 0.4–0.6)
Gamma Globulin: 0.8 g/dL (ref 0.8–1.7)
Total Protein: 6.6 g/dL (ref 6.1–8.1)

## 2021-08-26 LAB — ANA: Anti Nuclear Antibody (ANA): POSITIVE — AB

## 2021-08-26 LAB — ANTI-NUCLEAR AB-TITER (ANA TITER): ANA Titer 1: 1:1280 {titer} — ABNORMAL HIGH

## 2021-08-28 LAB — VITAMIN B1: Vitamin B1 (Thiamine): 24 nmol/L (ref 8–30)

## 2021-09-05 ENCOUNTER — Other Ambulatory Visit: Payer: Self-pay | Admitting: Cardiology

## 2021-09-10 ENCOUNTER — Telehealth: Payer: Self-pay

## 2021-09-10 NOTE — Progress Notes (Addendum)
Chronic Care Management Pharmacy Assistant   Name: Kathleen Williamson  MRN: [id number] DOB: [birth year]   Reason for Encounter: Medication Adherence and Delivery Coordination   Recent office visits:  08/21/21-Javier Gutierrez,MD(PCP)-bilateral hand pain,f/u depression, Increase effexor XR to full dose 227m.Labs ordered,( ANA general autoimmune test returned highly positive,Vitamin B12 was low normal. Thyroid was normal. She remains mildly anemic. Kidney function remains impaired,referral to rheumatology.  Recent consult visits:  None since last CCM contact   Hospital visits:  None in previous 6 months  Medications: Outpatient Encounter Medications as of 09/10/2021  Medication Sig Note   Accu-Chek FastClix Lancets MISC USE AS INSTRUCTED TO CHECK BLOOD SUGAR ONCE DAILY    acetaminophen (TYLENOL) 325 MG tablet Take 650 mg by mouth every 6 (six) hours as needed for moderate pain or headache.    amLODipine (NORVASC) 2.5 MG tablet TAKE ONE TABLET BY MOUTH EVERY MORNING    Black Pepper-Turmeric (TURMERIC COMPLEX/BLACK PEPPER) 3-500 MG CAPS Take 1 capsule by mouth daily.    Blood Glucose Monitoring Suppl (ACCU-CHEK GUIDE) w/Device KIT 1 each by Does not apply route as directed. Use as instructed to check blood sugar once daily    Cholecalciferol (VITAMIN D3) 25 MCG (1000 UT) CAPS Take 1 capsule (1,000 Units total) by mouth daily.    clonazePAM (KLONOPIN) 0.5 MG tablet Take 1 tablet (0.5 mg total) by mouth daily as needed for anxiety (sedation precautions).    denosumab (PROLIA) 60 MG/ML SOSY injection Inject 60 mg into the skin every 6 (six) months. 07/30/2021: Last injection 06/18/21   ezetimibe (ZETIA) 10 MG tablet Take 1 tablet (10 mg total) by mouth daily.    glucose blood (ONETOUCH VERIO) test strip Use as instructed to check blood sugar once a day    levothyroxine (SYNTHROID) 50 MCG tablet TAKE ONE TABLET BY MOUTH ONCE DAILY    loperamide (IMODIUM A-D) 2 MG tablet Take 2 mg by mouth 4 (four)  times daily as needed for diarrhea or loose stools.    mirtazapine (REMERON) 15 MG tablet TAKE ONE TABLET BY MOUTH EVERYDAY AT BEDTIME    nitroGLYCERIN (NITROLINGUAL) 0.4 MG/SPRAY spray Place 1 spray under the tongue every 5 (five) minutes as needed. (Patient taking differently: Place 1 spray under the tongue every 5 (five) minutes as needed for chest pain.)    pravastatin (PRAVACHOL) 80 MG tablet TAKE ONE TABLET BY MOUTH EVERYDAY AT BEDTIME    venlafaxine XR (EFFEXOR-XR) 75 MG 24 hr capsule Take 3 capsules (225 mg total) by mouth every morning.    No facility-administered encounter medications on file as of 09/10/2021.    BP Readings from Last 3 Encounters:  08/21/21 132/74  06/18/21 124/66  12/11/20 (!) 160/90    Lab Results  Component Value Date   HGBA1C 7.1 (A) 06/18/2021      Recent OV, Consult or Hospital visit:  Recent medication changes indicated: 08/21/21  PCP increased venlafaxine to 2256m a day   Last adherence delivery date:08/20/21      Patient is due for next adherence delivery on: 09/19/21  Spoke with patient on 09/10/21 reviewed medications and coordinated delivery.  This delivery to include: Adherence Packaging  30 Days  Packs: Co Q10 100 mg- take 1 tablet bedtime Venlafaxine ER 75 mg - take 3 tablets  breakfast Mirtazapine 15 mg - take 1 tablet bedtime Pravastatin 80 mg - take 1 tablet bedtime Levothyroxine 50 mcg - take 1 tablet breakfast Aspirin 81 mg - take 1 tablet  breakfast Ezetimibe 10 mg - take 1 tablet breakfast Amlodipine 2.5 mg - take 1 tablet by mouth daily breakfast  VIAL medications: none  Any concerns about your medications? No  How often do you forget or accidentally miss a dose? Never  Is patient in packaging Yes  If yes  What is the date on your next pill pack?09/10/21  Any concerns or issues with your packaging? Patient likes package process   Refills requested from providers include: Ezetimibe   Confirmed delivery date of 09/19/21,  advised patient that pharmacy will contact them the morning of delivery.  Recent blood pressure readings are as follows:none available    Annual wellness visit in last year? Yes Most Recent BP reading:132/74  87-P  08/21/21   Charlene Brooke, CPP notified CCM: 01/30/22  Avel Sensor, [place]  [phone]

## 2021-09-17 ENCOUNTER — Ambulatory Visit (INDEPENDENT_AMBULATORY_CARE_PROVIDER_SITE_OTHER): Payer: PPO | Admitting: Family Medicine

## 2021-09-17 ENCOUNTER — Encounter: Payer: Self-pay | Admitting: Family Medicine

## 2021-09-17 VITALS — BP 136/70 | HR 85 | Temp 97.3°F | Ht 62.0 in | Wt 131.1 lb

## 2021-09-17 DIAGNOSIS — Z96641 Presence of right artificial hip joint: Secondary | ICD-10-CM

## 2021-09-17 DIAGNOSIS — R768 Other specified abnormal immunological findings in serum: Secondary | ICD-10-CM

## 2021-09-17 DIAGNOSIS — E118 Type 2 diabetes mellitus with unspecified complications: Secondary | ICD-10-CM

## 2021-09-17 DIAGNOSIS — I10 Essential (primary) hypertension: Secondary | ICD-10-CM

## 2021-09-17 DIAGNOSIS — M3390 Dermatopolymyositis, unspecified, organ involvement unspecified: Secondary | ICD-10-CM

## 2021-09-17 DIAGNOSIS — M810 Age-related osteoporosis without current pathological fracture: Secondary | ICD-10-CM

## 2021-09-17 DIAGNOSIS — M154 Erosive (osteo)arthritis: Secondary | ICD-10-CM

## 2021-09-17 DIAGNOSIS — H903 Sensorineural hearing loss, bilateral: Secondary | ICD-10-CM

## 2021-09-17 DIAGNOSIS — R200 Anesthesia of skin: Secondary | ICD-10-CM

## 2021-09-17 DIAGNOSIS — E1169 Type 2 diabetes mellitus with other specified complication: Secondary | ICD-10-CM

## 2021-09-17 DIAGNOSIS — Z0000 Encounter for general adult medical examination without abnormal findings: Secondary | ICD-10-CM | POA: Diagnosis not present

## 2021-09-17 DIAGNOSIS — Z7189 Other specified counseling: Secondary | ICD-10-CM

## 2021-09-17 DIAGNOSIS — N183 Chronic kidney disease, stage 3 unspecified: Secondary | ICD-10-CM

## 2021-09-17 DIAGNOSIS — E538 Deficiency of other specified B group vitamins: Secondary | ICD-10-CM

## 2021-09-17 DIAGNOSIS — F332 Major depressive disorder, recurrent severe without psychotic features: Secondary | ICD-10-CM

## 2021-09-17 DIAGNOSIS — E039 Hypothyroidism, unspecified: Secondary | ICD-10-CM

## 2021-09-17 DIAGNOSIS — E785 Hyperlipidemia, unspecified: Secondary | ICD-10-CM | POA: Diagnosis not present

## 2021-09-17 DIAGNOSIS — I6523 Occlusion and stenosis of bilateral carotid arteries: Secondary | ICD-10-CM

## 2021-09-17 DIAGNOSIS — I6502 Occlusion and stenosis of left vertebral artery: Secondary | ICD-10-CM

## 2021-09-17 NOTE — Patient Instructions (Addendum)
Labs today Urinalysis today - if unable may drop off tomorrow.  Start B12 500-1070mg daily.  Call to schedule mammogram at your convenience: [facility] [phone] to see you today  Return as needed or in 3-4 months for follow up visit   Health Maintenance After Age 80After age 80 you are at a higher risk for certain long-term diseases and infections as well as injuries from falls. Falls are a major cause of broken bones and head injuries in people who are older than age 80 Getting regular preventive care can help to keep you healthy and well. Preventive care includes getting regular testing and making lifestyle changes as recommended by your health care provider. Talk with your health care provider about: Which screenings and tests you should have. A screening is a test that checks for a disease when you have no symptoms. A diet and exercise plan that is right for you. What should I know about screenings and tests to prevent falls? Screening and testing are the best ways to find a health problem early. Early diagnosis and treatment give you the best chance of managing medical conditions that are common after age 801 Certain conditions and lifestyle choices may make you more likely to have a fall. Your health care provider may recommend: Regular vision checks. Poor vision and conditions such as cataracts can make you more likely to have a fall. If you wear glasses, make sure to get your prescription updated if your vision changes. Medicine review. Work with your health care provider to regularly review all of the medicines you are taking, including over-the-counter medicines. Ask your health care provider about any side effects that may make you more likely to have a fall. Tell your health care provider if any medicines that you take make you feel dizzy or sleepy. Strength and balance checks. Your health care provider may recommend certain tests to check your strength  and balance while standing, walking, or changing positions. Foot health exam. Foot pain and numbness, as well as not wearing proper footwear, can make you more likely to have a fall. Screenings, including: Osteoporosis screening. Osteoporosis is a condition that causes the bones to get weaker and break more easily. Blood pressure screening. Blood pressure changes and medicines to control blood pressure can make you feel dizzy. Depression screening. You may be more likely to have a fall if you have a fear of falling, feel depressed, or feel unable to do activities that you used to do. Alcohol use screening. Using too much alcohol can affect your balance and may make you more likely to have a fall. Follow these instructions at home: Lifestyle Do not drink alcohol if: Your health care provider tells you not to drink. If you drink alcohol: Limit how much you have to: 0-1 drink a day for women. 0-2 drinks a day for men. Know how much alcohol is in your drink. In the [place] one drink equals one 12 oz bottle of beer (355 mL), one 5 oz glass of wine (148 mL), or one 1 oz glass of hard liquor (44 mL). Do not use any products that contain nicotine or tobacco. These products include cigarettes, chewing tobacco, and vaping devices, such as e-cigarettes. If you need help quitting, ask your health care provider. Activity  Follow a regular exercise program to stay fit. This will help you maintain your balance. Ask your health care provider what types of exercise are appropriate for you. If you need a  cane or walker, use it as recommended by your health care provider. Wear supportive shoes that have nonskid soles. Safety  Remove any tripping hazards, such as rugs, cords, and clutter. Install safety equipment such as grab bars in bathrooms and safety rails on stairs. Keep rooms and walkways well-lit. General instructions Talk with your health care provider about your risks for falling. Tell your health  care provider if: You fall. Be sure to tell your health care provider about all falls, even ones that seem minor. You feel dizzy, tiredness (fatigue), or off-balance. Take over-the-counter and prescription medicines only as told by your health care provider. These include supplements. Eat a healthy diet and maintain a healthy weight. A healthy diet includes low-fat dairy products, low-fat (lean) meats, and fiber from whole grains, beans, and lots of fruits and vegetables. Stay current with your vaccines. Schedule regular health, dental, and eye exams. Summary Having a healthy lifestyle and getting preventive care can help to protect your health and wellness after age 80. Screening and testing are the best way to find a health problem early and help you avoid having a fall. Early diagnosis and treatment give you the best chance for managing medical conditions that are more common for people who are older than age 80. Falls are a major cause of broken bones and head injuries in people who are older than age 80. Take precautions to prevent a fall at home. Work with your health care provider to learn what changes you can make to improve your health and wellness and to prevent falls. This information is not intended to replace advice given to you by your health care provider. Make sure you discuss any questions you have with your health care provider. Document Revised: 08/20/2020 Document Reviewed: 08/20/2020 Elsevier Patient Education  [place].

## 2021-09-17 NOTE — Progress Notes (Signed)
Patient ID: Kathleen Williamson, female    DOB: July 07, 1940, 81 y.o.   MRN: [id number]  This visit was conducted in person.  BP 136/70   Pulse 85   Temp (!) 97.3 F (36.3 C) (Temporal)   Ht 5' 2"  (1.575 m)   Wt 131 lb 2 oz (59.5 kg)   LMP  (LMP Unknown)   SpO2 98%   BMI 23.98 kg/m    CC: AMW/CPE Subjective:   HPI: Kathleen Williamson is a 81 y.o. female presenting on 09/17/2021 for Medicare Wellness   Did not see health advisor this year.  Hearing Screening   500Hz  1000Hz  2000Hz  4000Hz   Right ear 40 0 0 0  Left ear 40 40 40 40  Vision Screening - Comments:: Last eye exam, 05/2021.  [place] Office Visit from 09/17/2021 in [place] at [facility] Total Score 2     R hearing loss noted today - denies difficulty.     09/17/2021    2:37 PM 06/18/2020   10:05 AM 02/22/2019   10:20 AM 02/16/2018    9:27 AM 06/29/2017    6:08 PM  Fall Risk   Falls in the past year? 0 1 0 1 No  Comment    fell after tripping over broken concrete on sidewalk; loss of consciousness and head trauma   Number falls in past yr:  1  0   Injury with Fall?  0  1     Longstanding MDD - continues effexor XR daily with remeron 43m nightly, as well as klonopin 0.564mdaily PRN. Last visit we increased effexor to XR 22563maily - doesn't note much improvement with this. She last saw counselor Tin[facility]tila [facility] - declines return at this time. Did not see Dr KapNicolasa Duckingdesires to hold off on referral at this time.   She stopped farxiga due to concern over recurrent yeast infection.   Bilateral hand pain - xrays showed erosive osteoarthritis  Personal h/o dermatomyositis  Fmhx RA (mother)  [place] improvement in numbness and swelling. She also continues voltaren gel nightly.  Upcoming rheumatology appt (Deveshwar) 02/2022.   Preventative: Colonoscopy 05/23/2014 microscopic colitis attributed to zoloft (Dr JohWynetta Emery [facility]ag[place]ahnomenBreast  cancer screening - 06/2019 WNL (SoTeola Bradleyell woman with pap 03/2014 ([place]) - will not return. S/p complete hysterectomy with B oophorectomy for endometriosis.  Lung cancer screening - never smoker  DEXA scan - 01/2015 -2.9 hip, -2.2 spine. Does not want bisphosphonate. She has not been on treatment for this. She does take calcium and vitamin D regularly.  Dexa 2019 - T score -2.6 (improvement). Prolia started 08/2017.  DEXA 06/2019: T score -2.7 L hip, -2.1 spine.  Flu shot yearly  COVID vaccine Moderna 04/2019, 05/2019, booster 02/2020, 08/2020, Pfizer bivalent 12/2020 Td - 2008, Tdap 2019  Pneumovax 2008, prevnar-13 2017 zostavax - 2011 shingrix - 02/2017, 04/2017 Advanced directive discussion - has completed through attorney. Asked to bring us [place]. Very difficult to complete since husband's passing.  Seat belt use discussed  Sunscreen use discussed, no changing moles on skin  Non smoker Alcohol - none Dentist q6 mo  Eye exam yearly  Bowel - no constipation  Bladder - some urge incontinence, manageable   [facility] 59s died suddenly 6/2July 17, 2015No children, no siblings.   Close friends JudBethena Roysd GarAce [place], [place]  Activity: going to [place] Diet: poor - lots of frozen dinners, eating out      Relevant past medical, surgical, family and social history reviewed and updated as indicated. Interim medical history since our last visit reviewed. Allergies and medications reviewed and updated. Outpatient Medications Prior to Visit  Medication Sig Dispense Refill   Accu-Chek FastClix Lancets MISC USE AS INSTRUCTED TO CHECK BLOOD SUGAR ONCE DAILY 102 each 1   acetaminophen (TYLENOL) 325 MG tablet Take 650 mg by mouth every 6 (six) hours as needed for moderate pain or headache.     amLODipine (NORVASC) 2.5 MG tablet TAKE ONE TABLET BY MOUTH EVERY MORNING 90 tablet 0   Black Pepper-Turmeric (TURMERIC COMPLEX/BLACK PEPPER) 3-500 MG CAPS Take 1 capsule by  mouth daily.     Blood Glucose Monitoring Suppl (ACCU-CHEK GUIDE) w/Device KIT 1 each by Does not apply route as directed. Use as instructed to check blood sugar once daily 1 kit 0   Cholecalciferol (VITAMIN D3) 25 MCG (1000 UT) CAPS Take 1 capsule (1,000 Units total) by mouth daily. 30 capsule    clonazePAM (KLONOPIN) 0.5 MG tablet Take 1 tablet (0.5 mg total) by mouth daily as needed for anxiety (sedation precautions). 30 tablet 0   denosumab (PROLIA) 60 MG/ML SOSY injection Inject 60 mg into the skin every 6 (six) months.     ezetimibe (ZETIA) 10 MG tablet Take 1 tablet (10 mg total) by mouth daily. 15 tablet 0   glucose blood (ONETOUCH VERIO) test strip Use as instructed to check blood sugar once a day 100 each 3   levothyroxine (SYNTHROID) 50 MCG tablet TAKE ONE TABLET BY MOUTH ONCE DAILY 90 tablet 0   loperamide (IMODIUM A-D) 2 MG tablet Take 2 mg by mouth 4 (four) times daily as needed for diarrhea or loose stools.     mirtazapine (REMERON) 15 MG tablet TAKE ONE TABLET BY MOUTH EVERYDAY AT BEDTIME 30 tablet 6   nitroGLYCERIN (NITROLINGUAL) 0.4 MG/SPRAY spray Place 1 spray under the tongue every 5 (five) minutes as needed. (Patient taking differently: Place 1 spray under the tongue every 5 (five) minutes as needed for chest pain.) 12 g prn   pravastatin (PRAVACHOL) 80 MG tablet TAKE ONE TABLET BY MOUTH EVERYDAY AT BEDTIME 30 tablet 6   venlafaxine XR (EFFEXOR-XR) 75 MG 24 hr capsule Take 3 capsules (225 mg total) by mouth every morning. 270 capsule 1   No facility-administered medications prior to visit.     Per HPI unless specifically indicated in ROS section below Review of Systems  Constitutional:  Negative for activity change, appetite change, chills, fatigue, fever and unexpected weight change.  HENT:  Negative for hearing loss.   Eyes:  Negative for visual disturbance.  Respiratory:  Negative for cough, chest tightness, shortness of breath and wheezing.   Cardiovascular:  Negative  for chest pain, palpitations and leg swelling.  Gastrointestinal:  Negative for abdominal distention, abdominal pain, blood in stool, constipation, diarrhea, nausea and vomiting.  Genitourinary:  Negative for difficulty urinating and hematuria.  Musculoskeletal:  Negative for arthralgias, myalgias and neck pain.  Skin:  Negative for rash.  Neurological:  Negative for dizziness, seizures, syncope and headaches.  Hematological:  Negative for adenopathy. Does not bruise/bleed easily.  Psychiatric/Behavioral:  Positive for dysphoric mood. The patient is not nervous/anxious.     Objective:  BP 136/70   Pulse 85   Temp (!) 97.3 F (36.3 C) (Temporal)   Ht 5' 2"  (1.575 m)   Wt  131 lb 2 oz (59.5 kg)   LMP  (LMP Unknown)   SpO2 98%   BMI 23.98 kg/m   Wt Readings from Last 3 Encounters:  09/17/21 131 lb 2 oz (59.5 kg)  08/21/21 132 lb (59.9 kg)  06/18/21 130 lb 4 oz (59.1 kg)      Physical Exam Vitals and nursing note reviewed.  Constitutional:      Appearance: Normal appearance. She is not ill-appearing.  HENT:     Head: Normocephalic and atraumatic.     Right Ear: Tympanic membrane, ear canal and external ear normal. There is no impacted cerumen.     Left Ear: Tympanic membrane, ear canal and external ear normal. There is no impacted cerumen.  Eyes:     General:        Right eye: No discharge.        Left eye: No discharge.     Extraocular Movements: Extraocular movements intact.     Conjunctiva/sclera: Conjunctivae normal.     Pupils: Pupils are equal, round, and reactive to light.  Neck:     Thyroid: No thyroid mass or thyromegaly.     Vascular: No carotid bruit.  Cardiovascular:     Rate and Rhythm: Normal rate and regular rhythm.     Pulses: Normal pulses.     Heart sounds: Normal heart sounds. No murmur heard. Pulmonary:     Effort: Pulmonary effort is normal. No respiratory distress.     Breath sounds: Normal breath sounds. No wheezing, rhonchi or rales.  Abdominal:      General: Bowel sounds are normal. There is no distension.     Palpations: Abdomen is soft. There is no mass.     Tenderness: There is no abdominal tenderness. There is no guarding or rebound.     Hernia: No hernia is present.  Musculoskeletal:     Cervical back: Normal range of motion and neck supple. No rigidity.     Right lower leg: No edema.     Left lower leg: No edema.  Lymphadenopathy:     Cervical: No cervical adenopathy.  Skin:    General: Skin is warm and dry.     Findings: No rash.  Neurological:     General: No focal deficit present.     Mental Status: She is alert. Mental status is at baseline.     Comments:  Recall 3/3 Calculation 5/5 DLROW  Psychiatric:        Mood and Affect: Mood normal.        Behavior: Behavior normal.       Results for orders placed or performed in visit on 09/17/21  CK  Result Value Ref Range   Total CK 32 7 - 177 U/L  Comprehensive metabolic panel  Result Value Ref Range   Sodium 139 135 - 145 mEq/L   Potassium 4.1 3.5 - 5.1 mEq/L   Chloride 107 96 - 112 mEq/L   CO2 21 19 - 32 mEq/L   Glucose, Bld 112 (H) 70 - 99 mg/dL   BUN 19 6 - 23 mg/dL   Creatinine, Ser 1.43 (H) 0.40 - 1.20 mg/dL   Total Bilirubin 0.2 0.2 - 1.2 mg/dL   Alkaline Phosphatase 55 39 - 117 U/L   AST 14 0 - 37 U/L   ALT 13 0 - 35 U/L   Total Protein 6.9 6.0 - 8.3 g/dL   Albumin 4.1 3.5 - 5.2 g/dL   GFR 34.83 (L) >60.00 mL/min  Calcium 9.4 8.4 - 10.5 mg/dL  Lipid panel  Result Value Ref Range   Cholesterol 150 0 - 200 mg/dL   Triglycerides (H) 0.0 - 149.0 mg/dL    446.0 Triglyceride is over 400; calculations on Lipids are invalid.   HDL 37.40 (L) >39.00 mg/dL   Total CHOL/HDL Ratio 4   LDL cholesterol, direct  Result Value Ref Range   Direct LDL 79.0 mg/dL   Lab Results  Component Value Date   HGBA1C 7.1 (A) 06/18/2021        09/17/2021    4:12 PM 08/21/2021    2:37 PM 10/30/2020    2:46 PM 06/18/2020   11:17 AM 05/29/2020    2:36 PM  Depression  screen PHQ 2/9  Decreased Interest 1 3 2  0 1  Down, Depressed, Hopeless 1 3 2 1 1   PHQ - 2 Score 2 6 4 1 2   Altered sleeping 0 2 2 0 1  Tired, decreased energy 1 2 2  0 1  Change in appetite 0 2 2 1 2   Feeling bad or failure about yourself  0 0 1 0 1  Trouble concentrating 0 2 1 0 0  Moving slowly or fidgety/restless 0 0 0 0 0  Suicidal thoughts 0 0 0 0 0  PHQ-9 Score 3 14 12 2 7   Difficult doing work/chores Somewhat difficult Somewhat difficult Somewhat difficult         09/17/2021    4:12 PM 08/21/2021    2:38 PM 10/30/2020    2:46 PM 06/18/2020   11:18 AM  GAD 7 : Generalized Anxiety Score  Nervous, Anxious, on Edge 1 1  1   Control/stop worrying 1 2 1 1   Worry too much - different things 1 1 0 0  Trouble relaxing 1 1 0 1  Restless 0 0 0 0  Easily annoyed or irritable 0 0 0 0  Afraid - awful might happen 0 0 0 0  Total GAD 7 Score 4 5  3   Anxiety Difficulty Not difficult at all Somewhat difficult Not difficult at all    Assessment & Plan:   Problem List Items Addressed This Visit     Medicare annual wellness visit, subsequent - Primary (Chronic)    I have personally reviewed the Medicare Annual Wellness questionnaire and have noted 1. The patient's medical and social history 2. Their use of alcohol, tobacco or illicit drugs 3. Their current medications and supplements 4. The patient's functional ability including ADL's, fall risks, home safety risks and hearing or visual impairment. Cognitive function has been assessed and addressed as indicated.  5. Diet and physical activity 6. Evidence for depression or mood disorders The patients weight, height, BMI have been recorded in the chart. I have made referrals, counseling and provided education to the patient based on review of the above and I have provided the pt with a written personalized care plan for preventive services. Provider list updated.. See scanned questionairre as needed for further documentation. Reviewed  preventative protocols and updated unless pt declined.       Advanced care planning/counseling discussion (Chronic)    Advanced directive discussion - has completed through attorney. Asked to bring [name] a copy. Very difficult to complete since husband's passing.       Health maintenance examination (Chronic)    Preventative protocols reviewed and updated unless pt declined. Discussed healthy diet and lifestyle.       Hyperlipidemia associated with type 2 diabetes mellitus ([place])  Update FLP on pravastatin 25m and ezetimibe. The 10-year ASCVD risk score (Arnett DK, et al., 2019) is: 53.9%   Values used to calculate the score:     Age: [age]years     Sex: Female     Is Non-Hispanic African American: No     Diabetic: Yes     Tobacco smoker: No     Systolic Blood Pressure: 1917mmHg     Is BP treated: Yes     HDL Cholesterol: 37.4 mg/dL     Total Cholesterol: 150 mg/dL       Relevant Orders   Comprehensive metabolic panel (Completed)   Lipid panel (Completed)   LDL cholesterol, direct (Completed)   MDD (major depressive disorder), recurrent severe, without psychosis (HCC)    Chronic, resistant to treatment. Currently on full dose Effexor XR 2225mdaily, remeron 5512mightly, and klonopin 0.5mg56m PRN. Declines counseling or psych eval at this time.       Osteoporosis    Latest DEXA T score -1.7 - she declined bisphosphonate, overdue for prolia (last done 07/2020) however concerned this caused worsening hand pain so declines repeat. Discussed cal vit D and regular weight bearing exercise.      Hypothyroidism    Chronic, stable on levothyroxine 50mc34mily.       Dermatomyositis (HCC) [place]/o this, in remission.  With worsening joint pains, update CPK levels.  Rheum f/u planned 02/2022.       CKD stage 3 due to type 2 diabetes mellitus (HCC)    Update renal panel.       Carotid stenosis    Stable mild stenosis.       Vertebral artery stenosis/occlusion    Consider  updated carotid US.  [name]   Diabetes mellitus type 2, controlled, with complications (HCC)    Chronic, stable diet controlled. She has declined SGLT2i due to concern over side effects.       Sensorineural hearing loss, bilateral    Declines further evaluation at this time.       Status post right hip replacement   Hypertension    Chronic, stable on low dose amlodipine.       Erosive osteoarthritis of both hands    Severe, activity limiting pain. Finds accupuncture has been helpful - continue this.       Relevant Orders   CK (Completed)   Bilateral hand numbness    [place]   Positive ANA (antinuclear antibody)   Low serum vitamin B12    b12 low normal - rec start 500-1000mcg77mly.         Meds ordered this encounter  Medications   vitamin B-12 (CYANOCOBALAMIN) 1000 MCG tablet    Sig: Take 1 tablet (1,000 mcg total) by mouth daily.   Orders Placed This Encounter  Procedures   CK   Comprehensive metabolic panel   Lipid panel   LDL cholesterol, direct     Patient instructions: Labs today Urinalysis today - if unable may drop off tomorrow.  Start B12 500-1000mcg 4my.  Call to schedule mammogram at your convenience: [facility] [phone] see you today  Return as needed or in 3-4 months for follow up visit   Follow up plan: Return in about 4 months (around 01/17/2022) for follow up visit.  Gopal Malter [name]

## 2021-09-17 NOTE — Assessment & Plan Note (Signed)
Advanced directive discussion - has completed through attorney. Asked to bring [name] a copy. Very difficult to complete since husband's passing.

## 2021-09-18 ENCOUNTER — Other Ambulatory Visit (INDEPENDENT_AMBULATORY_CARE_PROVIDER_SITE_OTHER): Payer: PPO

## 2021-09-18 DIAGNOSIS — N183 Chronic kidney disease, stage 3 unspecified: Secondary | ICD-10-CM | POA: Diagnosis not present

## 2021-09-18 DIAGNOSIS — E1122 Type 2 diabetes mellitus with diabetic chronic kidney disease: Secondary | ICD-10-CM

## 2021-09-18 LAB — COMPREHENSIVE METABOLIC PANEL
ALT: 13 U/L (ref 0–35)
AST: 14 U/L (ref 0–37)
Albumin: 4.1 g/dL (ref 3.5–5.2)
Alkaline Phosphatase: 55 U/L (ref 39–117)
BUN: 19 mg/dL (ref 6–23)
CO2: 21 mEq/L (ref 19–32)
Calcium: 9.4 mg/dL (ref 8.4–10.5)
Chloride: 107 mEq/L (ref 96–112)
Creatinine, Ser: 1.43 mg/dL — ABNORMAL HIGH (ref 0.40–1.20)
GFR: 34.83 mL/min — ABNORMAL LOW (ref >60.00)
Glucose, Bld: 112 mg/dL — ABNORMAL HIGH (ref 70–99)
Potassium: 4.1 mEq/L (ref 3.5–5.1)
Sodium: 139 mEq/L (ref 135–145)
Total Bilirubin: 0.2 mg/dL (ref 0.2–1.2)
Total Protein: 6.9 g/dL (ref 6.0–8.3)

## 2021-09-18 LAB — LIPID PANEL
Cholesterol: 150 mg/dL (ref 0–200)
HDL: 37.4 mg/dL — ABNORMAL LOW (ref >39.00)
Total CHOL/HDL Ratio: 4
Triglycerides: 446 mg/dL — ABNORMAL HIGH (ref 0.0–149.0)

## 2021-09-18 LAB — CK: Total CK: 32 U/L (ref 7–177)

## 2021-09-18 LAB — LDL CHOLESTEROL, DIRECT: Direct LDL: 79 mg/dL

## 2021-09-19 LAB — URINALYSIS, ROUTINE W REFLEX MICROSCOPIC
Bilirubin Urine: NEGATIVE
Hgb urine dipstick: NEGATIVE
Ketones, ur: NEGATIVE
Leukocytes,Ua: NEGATIVE
Nitrite: NEGATIVE
RBC / HPF: NONE SEEN (ref 0–?)
Specific Gravity, Urine: <=1.005 — AB (ref 1.000–1.030)
Total Protein, Urine: NEGATIVE
Urine Glucose: NEGATIVE
Urobilinogen, UA: 0.2 (ref 0.0–1.0)
pH: 6 (ref 5.0–8.0)

## 2021-09-21 DIAGNOSIS — E785 Hyperlipidemia, unspecified: Secondary | ICD-10-CM | POA: Insufficient documentation

## 2021-09-21 DIAGNOSIS — E538 Deficiency of other specified B group vitamins: Secondary | ICD-10-CM | POA: Insufficient documentation

## 2021-09-21 MED ORDER — VITAMIN B-12 1000 MCG PO TABS: 1000.0000 ug | ORAL_TABLET | Freq: Every day | ORAL | Status: DC

## 2021-09-21 NOTE — Assessment & Plan Note (Signed)
Chronic, stable on levothyroxine 35mg daily.

## 2021-09-21 NOTE — Assessment & Plan Note (Addendum)
Latest DEXA T score -1.7 - she declined bisphosphonate, overdue for prolia (last done 07/2020) however concerned this caused worsening hand pain so declines repeat. Discussed cal vit D and regular weight bearing exercise.

## 2021-09-21 NOTE — Assessment & Plan Note (Signed)
Preventative protocols reviewed and updated unless pt declined. Discussed healthy diet and lifestyle.  

## 2021-09-21 NOTE — Assessment & Plan Note (Signed)
Severe, activity limiting pain. Finds accupuncture has been helpful - continue this.

## 2021-09-21 NOTE — Assessment & Plan Note (Signed)
Chronic, stable on low dose amlodipine.  

## 2021-09-21 NOTE — Assessment & Plan Note (Addendum)
Chronic, resistant to treatment. Currently on full dose Effexor XR '225mg'$  daily, remeron '55mg'$  nightly, and klonopin 0.'5mg'$  QD PRN. Declines counseling or psych eval at this time.

## 2021-09-21 NOTE — Assessment & Plan Note (Signed)
Improving with acupuncturist in  in [place].

## 2021-09-21 NOTE — Assessment & Plan Note (Signed)
H/o this, in remission.  With worsening joint pains, update CPK levels.  Rheum f/u planned 02/2022.

## 2021-09-21 NOTE — Assessment & Plan Note (Signed)
Update renal panel 

## 2021-09-21 NOTE — Assessment & Plan Note (Signed)
Declines further evaluation at this time.

## 2021-09-21 NOTE — Assessment & Plan Note (Addendum)
Stable mild stenosis.

## 2021-09-21 NOTE — Assessment & Plan Note (Signed)
Update FLP on pravastatin '80mg'$  and ezetimibe. The 10-year ASCVD risk score (Arnett DK, et al., 2019) is: 53.9%   Values used to calculate the score:     Age: 81 years     Sex: Female     Is Non-Hispanic African American: No     Diabetic: Yes     Tobacco smoker: No     Systolic Blood Pressure: 761 mmHg     Is BP treated: Yes     HDL Cholesterol: 37.4 mg/dL     Total Cholesterol: 150 mg/dL

## 2021-09-21 NOTE — Assessment & Plan Note (Signed)
Consider updated carotid US.  

## 2021-09-21 NOTE — Assessment & Plan Note (Signed)

## 2021-09-21 NOTE — Assessment & Plan Note (Signed)
b12 low normal - rec start 500-1051mg daily.

## 2021-09-21 NOTE — Assessment & Plan Note (Addendum)
Chronic, stable diet controlled. She has declined SGLT2i due to concern over side effects.

## 2021-09-22 ENCOUNTER — Other Ambulatory Visit: Payer: Self-pay | Admitting: Family Medicine

## 2021-09-22 DIAGNOSIS — E785 Hyperlipidemia, unspecified: Secondary | ICD-10-CM

## 2021-09-24 ENCOUNTER — Other Ambulatory Visit (INDEPENDENT_AMBULATORY_CARE_PROVIDER_SITE_OTHER): Payer: PPO

## 2021-09-24 DIAGNOSIS — E1169 Type 2 diabetes mellitus with other specified complication: Secondary | ICD-10-CM | POA: Diagnosis not present

## 2021-09-24 DIAGNOSIS — E785 Hyperlipidemia, unspecified: Secondary | ICD-10-CM | POA: Diagnosis not present

## 2021-09-24 LAB — LIPID PANEL
Cholesterol: 149 mg/dL (ref 0–200)
HDL: 45.9 mg/dL (ref >39.00)
LDL Cholesterol: 73 mg/dL (ref 0–99)
NonHDL: 102.87
Total CHOL/HDL Ratio: 3
Triglycerides: 148 mg/dL (ref 0.0–149.0)
VLDL: 29.6 mg/dL (ref 0.0–40.0)

## 2021-09-30 ENCOUNTER — Telehealth: Payer: Self-pay | Admitting: Cardiology

## 2021-09-30 ENCOUNTER — Other Ambulatory Visit: Payer: Self-pay

## 2021-09-30 MED ORDER — EZETIMIBE 10 MG PO TABS: 10.0000 mg | ORAL_TABLET | Freq: Every day | ORAL | 0 refills | Status: DC

## 2021-09-30 NOTE — Telephone Encounter (Signed)
*  STAT* If patient is at the pharmacy, call can be transferred to refill team.   1. Which medications need to be refilled? (please list name of each medication and dose if known)  ezetimibe (ZETIA) 10 MG tablet  2. Which pharmacy/location (including street and city if local pharmacy) is medication to be sent to?  [facility] - [place], [place] - [place] [address]  3. Do they need a 30 day or 90 day supply?   30 day   Patient has appt scheduled on 10/18/21.

## 2021-10-07 ENCOUNTER — Other Ambulatory Visit: Payer: Self-pay | Admitting: Cardiology

## 2021-10-07 ENCOUNTER — Other Ambulatory Visit: Payer: Self-pay | Admitting: Family Medicine

## 2021-10-08 ENCOUNTER — Telehealth: Payer: Self-pay

## 2021-10-08 NOTE — Chronic Care Management (AMB) (Addendum)
Chronic Care Management Pharmacy Assistant   Name: Kathleen Williamson  MRN: [id number] DOB: 16-Aug-1940   Reason for Encounter: Medication Adherence and Delivery Coordination  Kathleen Williamson  Recent office visits:  09/17/21-Kathleen Gutierrez,MD(PCP) AWV, discussed screenings, vaccines, labs ordered ( kidney function remains impaired but stable,Liver function returned normal,triglyceride levels which were very high )( patient did not fast) normal UA, Start B12 500-1030mg daily f/u  4 months  Recent consult visits:  None since last CCM contact   Hospital visits:  None in previous 6 months  Medications: Outpatient Encounter Medications as of 10/08/2021  Medication Sig   Accu-Chek FastClix Lancets MISC USE AS INSTRUCTED TO CHECK BLOOD SUGAR ONCE DAILY   acetaminophen (TYLENOL) 325 MG tablet Take 650 mg by mouth every 6 (six) hours as needed for moderate pain or headache.   amLODipine (NORVASC) 2.5 MG tablet TAKE ONE TABLET BY MOUTH EVERY MORNING   Black Pepper-Turmeric (TURMERIC COMPLEX/BLACK PEPPER) 3-500 MG CAPS Take 1 capsule by mouth daily.   Blood Glucose Monitoring Suppl (ACCU-CHEK GUIDE) w/Device KIT 1 each by Does not apply route as directed. Use as instructed to check blood sugar once daily   Cholecalciferol (VITAMIN D3) 25 MCG (1000 UT) CAPS Take 1 capsule (1,000 Units total) by mouth daily.   clonazePAM (KLONOPIN) 0.5 MG tablet Take 1 tablet (0.5 mg total) by mouth daily as needed for anxiety (sedation precautions).   denosumab (PROLIA) 60 MG/ML SOSY injection Inject 60 mg into the skin every 6 (six) months.   ezetimibe (ZETIA) 10 MG tablet Take 1 tablet (10 mg total) by mouth daily.   glucose blood (ONETOUCH VERIO) test strip Use as instructed to check blood sugar once a day   levothyroxine (SYNTHROID) 50 MCG tablet TAKE ONE TABLET BY MOUTH ONCE DAILY   loperamide (IMODIUM A-D) 2 MG tablet Take 2 mg by mouth 4 (four) times daily as needed for diarrhea or loose stools.   mirtazapine  (REMERON) 15 MG tablet TAKE ONE TABLET BY MOUTH EVERYDAY AT BEDTIME   nitroGLYCERIN (NITROLINGUAL) 0.4 MG/SPRAY spray Place 1 spray under the tongue every 5 (five) minutes as needed. (Patient taking differently: Place 1 spray under the tongue every 5 (five) minutes as needed for chest pain.)   pravastatin (PRAVACHOL) 80 MG tablet TAKE ONE TABLET BY MOUTH EVERYDAY AT BEDTIME   venlafaxine XR (EFFEXOR-XR) 75 MG 24 hr capsule Take 3 capsules (225 mg total) by mouth every morning.   vitamin B-12 (CYANOCOBALAMIN) 1000 MCG tablet Take 1 tablet (1,000 mcg total) by mouth daily.   No facility-administered encounter medications on file as of 10/08/2021.   BP Readings from Last 3 Encounters:  09/17/21 136/70  08/21/21 132/74  06/18/21 124/66    Lab Results  Component Value Date   HGBA1C 7.1 (A) 06/18/2021      Recent OV, Consult or Hospital visit:  Recent medication changes indicated:  PCP  B12 OTC   Last adherence delivery date:09/19/21      Patient is due for next adherence delivery on: 10/18/21  Spoke with patient on 10/09/21 reviewed medications and coordinated delivery.  This delivery to include: Adherence Packaging  30 Days  Packs: Co Q10 100 mg- take 1 tablet bedtime Venlafaxine ER 75 mg - take 3 tablets  breakfast Mirtazapine 15 mg - take 1 tablet bedtime Pravastatin 80 mg - take 1 tablet bedtime Levothyroxine 50 mcg - take 1 tablet breakfast Aspirin 81 mg - take 1 tablet breakfast Ezetimibe 10 mg - take 1 tablet  breakfast Amlodipine 2.5 mg - take 1 tablet by mouth daily breakfast  VIAL medications: none   Patient declined the following medications this month: none   Any concerns about your medications? No  How often do you forget or accidentally miss a dose? Never  Do you use a pillbox? No  Is patient in packaging Yes  What is the date on your next pill pack?10/09/21  Any concerns or issues with your packaging? No concerns at this time    Refills requested from  providers include: pravastatin, levothyroxine  Confirmed delivery date of 10/18/21, advised patient that pharmacy will contact them the morning of delivery.  Recent blood pressure readings are as follows: none available   Annual wellness visit in last year? Yes Most Recent BP reading: 136/70  85-P  09/17/21  Kathleen Williamson, CPP notified  Kathleen Williamson, [place]  [phone]

## 2021-10-18 ENCOUNTER — Ambulatory Visit: Payer: PPO | Admitting: Physician Assistant

## 2021-10-18 ENCOUNTER — Telehealth: Payer: Self-pay | Admitting: Cardiology

## 2021-10-18 MED ORDER — EZETIMIBE 10 MG PO TABS: 10.0000 mg | ORAL_TABLET | Freq: Every day | ORAL | 0 refills | Status: DC

## 2021-10-18 NOTE — Telephone Encounter (Signed)
Pt's medication was sent to pt's pharmacy as requested. Confirmation received.  °

## 2021-10-18 NOTE — Telephone Encounter (Signed)
*  STAT* If patient is at the pharmacy, call can be transferred to refill team.   1. Which medications need to be refilled? (please list name of each medication and dose if known) ezetimibe (ZETIA) 10 MG tablet  2. Which pharmacy/location (including street and city if local pharmacy) is medication to be sent to? [facility] - [place], [place] - [place] [address]  3. Do they need a 30 day or 90 day supply? 30 day

## 2021-10-26 NOTE — Progress Notes (Signed)
Office Visit    Patient Name: [name] Date of Encounter: 10/26/2021  Primary Care Provider:  [name], MD Primary Cardiologist:  Candee Furbish, MD Primary Electrophysiologist: None  Chief Complaint    Kathleen Williamson is a 81 y.o. female with PMH of CAD s/p CABG x 5 in 2003 with LIMA-LAD, IMA to PDA,SVG-diagonal, SVG-OM 2-OM, carotid artery disease, HLD, hypothyroidism, dermatomyositis, depression who presents today for 1 year follow-up of coronary artery disease.  Past Medical History    Past Medical History:  Diagnosis Date   Arthritis    fingers   CAD (coronary artery disease) 2003   MI s/p 5v CABG   Carotid stenosis    [place]   CKD (chronic kidney disease) stage 3, GFR 30-59 ml/min (HCC)    Dermatomyositis ([place])    saw Dr Estanislado Pandy, improved on its own   Diabetes mellitus without complication ([place])    Forehead laceration 09/15/2017   History of chicken pox    History of measles    History of migraine none since 2003   HLD (hyperlipidemia)    Hypothyroidism    MDD (major depressive disorder), recurrent episode, moderate ([place])    h/o SI after lost husband unexpectedly 2015   Microscopic colitis 05/2014   by colonoscopy, lymphocytic, zoloft related   Myocardial infarction ([place]) 2003   Osteoporosis    DEXA 03/2013 T -3.1, DEXA 10//2016 -2.9 hip, -2.2 spine   Pancreatic cyst    Dr Ralene Ok, no f/u needed   Prediabetes 2014   Subclavian steal syndrome    Urine incontinence    Past Surgical History:  Procedure Laterality Date   APPENDECTOMY  1997   with hysterectomy   [place]   lower   BREAST LUMPECTOMY Left 1977   benign   COLONOSCOPY WITH PROPOFOL N/A 05/23/2014   microscopic colitis - Garlan Fair, MD   CORONARY ARTERY BYPASS GRAFT  2003   x 5 v   ESOPHAGOGASTRODUODENOSCOPY (EGD) WITH PROPOFOL N/A 05/23/2014   WNL Garlan Fair, MD   ROTATOR CUFF REPAIR Right [phone]   TOOTH EXTRACTION  yrs ago   TOTAL ABDOMINAL HYSTERECTOMY  W/ BILATERAL SALPINGOOPHORECTOMY  1997   endometriosis   TOTAL HIP ARTHROPLASTY Right 11/14/2020   Procedure: TOTAL HIP ARTHROPLASTY ANTERIOR APPROACH;  Surgeon: Gaynelle Arabian, MD;  Location: [facility];  Service: Orthopedics;  Laterality: Right;    Allergies  Allergies  Allergen Reactions   Sulfa Antibiotics Nausea Only   Crestor [Rosuvastatin Calcium] Other (See Comments)    Leg ache    History of Present Illness    Kathleen Williamson is a 81 year old female with the above mentioned PMH who presents today for one year follow up.  She was previously seen by Dr. Ilda Foil and is now followed by Dr. Marlou Porch. Patient had been experiencing intermittent chest pressure that radiated to her neck and shoulder with exertion that was also associated with palpitations.  She underwent exercise treadmill test and suffered anginal type symptoms and EKG changes.  She underwent left heart cath in 02/2002 and was found to have severe three-vessel CAD.  CABG x5 was completed by Dr. Roxy Manns in 03/2002.   She last saw Dr. Marlou Porch on 08/2020 for preoperative clearance for right total hip replacement.  Due to her history she underwent Lexiscan stress test was low risk and showed normal heart pumping function.  She proceeded with hip replacement in August 2022 and tolerated procedure without complication.  Since last being seen in the office patient reports that she is doing well with no new anginal complaints.  She is currently not dissipating in any physical activity but states that she is planning to increasing her walking regimen in the next few months.  Her blood pressure today was well managed at 128/76 and heart rate was 92.  EKG today did reveal new LBBB however patient is asymptomatic at this time.  She is also having rheumatoid pain in her fingers that is new and is currently following up with Dr. Estanislado Pandy next month.  Patient denies chest pain, palpitations, dyspnea, PND, orthopnea, nausea, vomiting, dizziness, syncope,  edema, weight gain, or early satiety.   Home Medications    Current Outpatient Medications  Medication Sig Dispense Refill   Accu-Chek FastClix Lancets MISC USE AS INSTRUCTED TO CHECK BLOOD SUGAR ONCE DAILY 102 each 1   acetaminophen (TYLENOL) 325 MG tablet Take 650 mg by mouth every 6 (six) hours as needed for moderate pain or headache.     amLODipine (NORVASC) 2.5 MG tablet TAKE ONE TABLET BY MOUTH EVERY MORNING 90 tablet 0   Black Pepper-Turmeric (TURMERIC COMPLEX/BLACK PEPPER) 3-500 MG CAPS Take 1 capsule by mouth daily.     Blood Glucose Monitoring Suppl (ACCU-CHEK GUIDE) w/Device KIT 1 each by Does not apply route as directed. Use as instructed to check blood sugar once daily 1 kit 0   Cholecalciferol (VITAMIN D3) 25 MCG (1000 UT) CAPS Take 1 capsule (1,000 Units total) by mouth daily. 30 capsule    clonazePAM (KLONOPIN) 0.5 MG tablet Take 1 tablet (0.5 mg total) by mouth daily as needed for anxiety (sedation precautions). 30 tablet 0   denosumab (PROLIA) 60 MG/ML SOSY injection Inject 60 mg into the skin every 6 (six) months.     ezetimibe (ZETIA) 10 MG tablet Take 1 tablet (10 mg total) by mouth daily. 30 tablet 0   glucose blood (ONETOUCH VERIO) test strip Use as instructed to check blood sugar once a day 100 each 3   levothyroxine (SYNTHROID) 50 MCG tablet TAKE ONE TABLET BY MOUTH ONCE DAILY 90 tablet 2   loperamide (IMODIUM A-D) 2 MG tablet Take 2 mg by mouth 4 (four) times daily as needed for diarrhea or loose stools.     mirtazapine (REMERON) 15 MG tablet TAKE ONE TABLET BY MOUTH EVERYDAY AT BEDTIME 30 tablet 6   nitroGLYCERIN (NITROLINGUAL) 0.4 MG/SPRAY spray Place 1 spray under the tongue every 5 (five) minutes as needed. (Patient taking differently: Place 1 spray under the tongue every 5 (five) minutes as needed for chest pain.) 12 g prn   pravastatin (PRAVACHOL) 80 MG tablet TAKE ONE TABLET BY MOUTH EVERYDAY AT BEDTIME 30 tablet 0   venlafaxine XR (EFFEXOR-XR) 75 MG 24 hr  capsule Take 3 capsules (225 mg total) by mouth every morning. 270 capsule 1   vitamin B-12 (CYANOCOBALAMIN) 1000 MCG tablet Take 1 tablet (1,000 mcg total) by mouth daily.     No current facility-administered medications for this visit.     Review of Systems  Please see the history of present illness.    (+) Rheumatoid pain in bilateral hands   All other systems reviewed and are otherwise negative except as noted above.  Physical Exam    Wt Readings from Last 3 Encounters:  09/17/21 131 lb 2 oz (59.5 kg)  08/21/21 132 lb (59.9 kg)  06/18/21 130 lb 4 oz (59.1 kg)   VE:HMCNO were no vitals filed for this  visit.,There is no height or weight on file to calculate BMI.  Constitutional:      Appearance: Healthy appearance. Not in distress.  Neck:     Vascular: JVD normal.  Pulmonary:     Effort: Pulmonary effort is normal.     Breath sounds: No wheezing. No rales. Diminished in the bases Cardiovascular:     Normal rate. Regular rhythm. Normal S1. Normal S2.      Murmurs: There is no murmur.  Edema:    Peripheral edema absent.  Abdominal:     Palpations: Abdomen is soft non tender. There is no hepatomegaly.  Skin:    General: Skin is warm and dry.  Neurological:     General: No focal deficit present.     Mental Status: Alert and oriented to person, place and time.     Cranial Nerves: Cranial nerves are intact.  EKG/LABS/Other Studies Reviewed    ECG personally reviewed by me today -normal sinus rhythm with new left bundle branch block with rate of 92 and no additional acute changes  Lab Results  Component Value Date   WBC 9.1 08/21/2021   HGB 11.5 (L) 08/21/2021   HCT 36.0 08/21/2021   MCV 87.7 08/21/2021   PLT 332.0 08/21/2021   Lab Results  Component Value Date   CREATININE 1.43 (H) 09/17/2021   BUN 19 09/17/2021   NA 139 09/17/2021   K 4.1 09/17/2021   CL 107 09/17/2021   CO2 21 09/17/2021   Lab Results  Component Value Date   ALT 13 09/17/2021   AST 14  09/17/2021   ALKPHOS 55 09/17/2021   BILITOT 0.2 09/17/2021   Lab Results  Component Value Date   CHOL 149 09/24/2021   HDL 45.90 09/24/2021   LDLCALC 73 09/24/2021   LDLDIRECT 79.0 09/17/2021   TRIG 148.0 09/24/2021   CHOLHDL 3 09/24/2021    Lab Results  Component Value Date   HGBA1C 7.1 (A) 06/18/2021    Assessment & Plan    1.  Coronary artery disease: - s/p CABG x 5 in 2003 with LIMA-LAD, IMA to PDA,SVG-diagonal, SVG-OM 2-OM,  -Patient denies any anginal symptoms at this time -Continue pravastatin 80 mg daily, Zetia 10 mg daily, -Patient encouraged to increase physical activity to at least 150 minutes/week as tolerated  2.  Carotid artery disease: -Patient had last carotid duplex 06/2020 and readings were stable compared to previous -Patient will continue statin therapy as noted above and will reach out to see why low-dose aspirin is not added to her current regimen.  3. Hyperlipidemia: -Patient's last LDL was 73 slightly above goal of 70 -Continue statin therapy as noted above -Increase physical activity as tolerated  4.  Chronic kidney disease stage IIIa: -Last creatinine was 1.43 -Currently followed by PCP  5.  New LBBB: -Discussed with DOD Dr. Caryl Comes who suggested that no rate control medication is needed at this time. -Patient has been asymptomatic with no new complaints at this time -2D echo will be ordered to evaluate for cardiomyopathy and valvular function.  Disposition: Follow-up with Candee Furbish, MD or APP in 1 months    Medication Adjustments/Labs and Tests Ordered: Current medicines are reviewed at length with the patient today.  Concerns regarding medicines are outlined above.   Signed, Mable Fill, Marissa Nestle, NP 10/26/2021, 3:20 PM [place]

## 2021-10-31 ENCOUNTER — Encounter: Payer: Self-pay | Admitting: Nurse Practitioner

## 2021-10-31 ENCOUNTER — Ambulatory Visit: Payer: PPO | Admitting: Nurse Practitioner

## 2021-10-31 VITALS — BP 128/76 | HR 92 | Ht 63.0 in | Wt 132.6 lb

## 2021-10-31 DIAGNOSIS — I6523 Occlusion and stenosis of bilateral carotid arteries: Secondary | ICD-10-CM | POA: Diagnosis not present

## 2021-10-31 DIAGNOSIS — E1122 Type 2 diabetes mellitus with diabetic chronic kidney disease: Secondary | ICD-10-CM | POA: Diagnosis not present

## 2021-10-31 DIAGNOSIS — N183 Chronic kidney disease, stage 3 unspecified: Secondary | ICD-10-CM

## 2021-10-31 DIAGNOSIS — E782 Mixed hyperlipidemia: Secondary | ICD-10-CM

## 2021-10-31 DIAGNOSIS — I447 Left bundle-branch block, unspecified: Secondary | ICD-10-CM

## 2021-10-31 MED ORDER — EZETIMIBE 10 MG PO TABS: 10.0000 mg | ORAL_TABLET | Freq: Every day | ORAL | 3 refills | Status: DC

## 2021-10-31 NOTE — Patient Instructions (Signed)
Medication Instructions:  Your physician recommends that you continue on your current medications as directed. Please refer to the Current Medication list given to you today.  *If you need a refill on your cardiac medications before your next appointment, please call your pharmacy*   Testing/Procedures: Your physician has requested that you have an echocardiogram. Echocardiography is a painless test that uses sound waves to create images of your heart. It provides your doctor with information about the size and shape of your heart and how well your heart's chambers and valves are working. This procedure takes approximately one hour. There are no restrictions for this procedure.    Follow-Up: At [facility], you and your health needs are our priority.  As part of our continuing mission to provide you with exceptional heart care, we have created designated Provider Care Teams.  These Care Teams include your primary Cardiologist (physician) and Advanced Practice Providers (APPs -  Physician Assistants and Nurse Practitioners) who all work together to provide you with the care you need, when you need it.  We recommend signing up for the patient portal called "MyChart".  Sign up information is provided on this After Visit Summary.  MyChart is used to connect with patients for Virtual Visits (Telemedicine).  Patients are able to view lab/test results, encounter notes, upcoming appointments, etc.  Non-urgent messages can be sent to your provider as well.   To learn more about what you can do with MyChart, go to [email or web address].    Your next appointment:   1 month(s)  The format for your next appointment:   In Person  Provider:   Ambrose Pancoast, NP      }   Other Instructions

## 2021-11-05 ENCOUNTER — Other Ambulatory Visit: Payer: Self-pay | Admitting: Cardiology

## 2021-11-05 ENCOUNTER — Other Ambulatory Visit: Payer: Self-pay | Admitting: Family Medicine

## 2021-11-06 ENCOUNTER — Telehealth: Payer: Self-pay

## 2021-11-06 NOTE — Chronic Care Management (AMB) (Signed)
Chronic Care Management Pharmacy Assistant   Name: Kathleen Williamson  MRN: [id number] DOB: 1940/07/28  Reason for Encounter: Medication Adherence and Delivery Coordination     Recent office visits:  None since last CCM contact   Recent consult visits:  10/31/21-Kathleen Dick,NP(cardio)-yearly f/u EKG,no medication changes f/u 1 year   Hospital visits:  None in previous 6 months  Medications: Outpatient Encounter Medications as of 11/06/2021  Medication Sig   Accu-Chek FastClix Lancets MISC USE AS INSTRUCTED TO CHECK BLOOD SUGAR ONCE DAILY   acetaminophen (TYLENOL) 325 MG tablet Take 650 mg by mouth every 6 (six) hours as needed for moderate pain or headache.   amLODipine (NORVASC) 2.5 MG tablet TAKE ONE TABLET BY MOUTH EVERY MORNING   Black Pepper-Turmeric (TURMERIC COMPLEX/BLACK PEPPER) 3-500 MG CAPS Take 1 capsule by mouth daily.   Blood Glucose Monitoring Suppl (ACCU-CHEK GUIDE) w/Device KIT 1 each by Does not apply route as directed. Use as instructed to check blood sugar once daily   Cholecalciferol (VITAMIN D3) 25 MCG (1000 UT) CAPS Take 1 capsule (1,000 Units total) by mouth daily.   clonazePAM (KLONOPIN) 0.5 MG tablet Take 1 tablet (0.5 mg total) by mouth daily as needed for anxiety (sedation precautions).   denosumab (PROLIA) 60 MG/ML SOSY injection Inject 60 mg into the skin every 6 (six) months.   ezetimibe (ZETIA) 10 MG tablet Take 1 tablet (10 mg total) by mouth daily.   glucose blood (ONETOUCH VERIO) test strip Use as instructed to check blood sugar once a day   levothyroxine (SYNTHROID) 50 MCG tablet TAKE ONE TABLET BY MOUTH ONCE DAILY   loperamide (IMODIUM A-D) 2 MG tablet Take 2 mg by mouth 4 (four) times daily as needed for diarrhea or loose stools.   mirtazapine (REMERON) 15 MG tablet TAKE ONE TABLET BY MOUTH EVERYDAY AT BEDTIME   nitroGLYCERIN (NITROLINGUAL) 0.4 MG/SPRAY spray Place 1 spray under the tongue every 5 (five) minutes as needed. (Patient taking  differently: Place 1 spray under the tongue every 5 (five) minutes as needed for chest pain.)   pravastatin (PRAVACHOL) 80 MG tablet TAKE ONE TABLET BY MOUTH EVERYDAY AT BEDTIME. Please keep upcoming appointment for future refills. Thank you.   venlafaxine XR (EFFEXOR-XR) 75 MG 24 hr capsule Take 3 capsules (225 mg total) by mouth every morning.   vitamin B-12 (CYANOCOBALAMIN) 1000 MCG tablet Take 1 tablet (1,000 mcg total) by mouth daily.   No facility-administered encounter medications on file as of 11/06/2021.    BP Readings from Last 3 Encounters:  10/31/21 128/76  09/17/21 136/70  08/21/21 132/74    Lab Results  Component Value Date   HGBA1C 7.1 (A) 06/18/2021      Recent OV, Consult or Hospital visit:  10/31/21-Cardiology No medication changes indicated   Last adherence delivery date:10/18/21      Patient is due for next adherence delivery on: 11/18/21  Spoke with patient on 11/06/21 reviewed medications and coordinated delivery.  This delivery to include: Adherence Packaging  30 Days  Packs: Co Q10 100 mg- take 1 tablet bedtime Venlafaxine ER 75 mg - take 3 tablets  breakfast Mirtazapine 15 mg - take 1 tablet bedtime Pravastatin 80 mg - take 1 tablet bedtime Levothyroxine 50 mcg - take 1 tablet breakfast Aspirin 81 mg - take 1 tablet breakfast Ezetimibe 10 mg - take 1 tablet breakfast Amlodipine 2.5 mg - take 1 tablet by mouth daily breakfast   VIAL medications: none  Patient declined the following  medications this month: none  Any concerns about your medications? No  How often do you forget or accidentally miss a dose? Never  Do you use a pillbox? No  Is patient in packaging Yes  What is the date on your next pill pack?11/05/21  Any concerns or issues with your packaging?  No concerns at this time    Refills requested from providers include: pravastatin , amlodipine   Confirmed delivery date of 11/18/21, advised patient that pharmacy will contact them the  morning of delivery.  Recent blood pressure readings are as follows:none available   Recent blood glucose readings are as follows:none available     Annual wellness visit in last year? Yes Most Recent BP reading:128/76  92-P  10/31/21  If Diabetic: Most recent A1C reading:7.1  06/18/21 Last eye exam / retinopathy screening:UTD Last diabetic foot exam:UTD  Cycle dispensing form sent to Margaretmary Dys, PTM for review.  Charlene Brooke, CPP notified  Avel Sensor, [place]  [phone]

## 2021-11-12 DIAGNOSIS — G5603 Carpal tunnel syndrome, bilateral upper limbs: Secondary | ICD-10-CM | POA: Diagnosis not present

## 2021-11-19 ENCOUNTER — Ambulatory Visit (HOSPITAL_COMMUNITY): Payer: PPO

## 2021-11-21 DIAGNOSIS — G5603 Carpal tunnel syndrome, bilateral upper limbs: Secondary | ICD-10-CM | POA: Diagnosis not present

## 2021-11-25 DIAGNOSIS — Z0279 Encounter for issue of other medical certificate: Secondary | ICD-10-CM

## 2021-11-26 ENCOUNTER — Telehealth: Payer: Self-pay | Admitting: Family Medicine

## 2021-11-26 NOTE — Telephone Encounter (Signed)
Patient in office to drop off driver license paperwork for Dr.G to fill out and has been placed in PCP folder. Call back number [phone].

## 2021-11-27 ENCOUNTER — Ambulatory Visit (HOSPITAL_COMMUNITY): Payer: PPO | Attending: Cardiology

## 2021-11-27 DIAGNOSIS — I447 Left bundle-branch block, unspecified: Secondary | ICD-10-CM | POA: Diagnosis not present

## 2021-11-27 LAB — ECHOCARDIOGRAM COMPLETE
Area-P 1/2: 3.85 cm2
S' Lateral: 4.2 cm

## 2021-11-27 NOTE — Progress Notes (Unsigned)
Office Visit    Patient Name: Kathleen Williamson Date of Encounter: 12/04/2021  Primary Care Provider:  [name], MD Primary Cardiologist:  Candee Furbish, MD Primary Electrophysiologist: None  Chief Complaint    Kathleen Williamson is a 81 y.o. female with PMH of CAD s/p CABG x 5 in 2003 with LIMA-LAD, IMA to PDA,SVG-diagonal, SVG-OM 2-OM, carotid artery disease, HLD, hypothyroidism, dermatomyositis, depression who presents today for 4 week follow-up of coronary artery disease and echocardiogram results.  Past Medical History    Past Medical History:  Diagnosis Date   Arthritis    fingers   CAD (coronary artery disease) 2003   MI s/p 5v CABG   Carotid stenosis    [place]   CKD (chronic kidney disease) stage 3, GFR 30-59 ml/min (HCC)    Dermatomyositis ([place])    saw Dr Estanislado Pandy, improved on its own   Diabetes mellitus without complication ([place])    Forehead laceration 09/15/2017   History of chicken pox    History of measles    History of migraine none since 2003   HLD (hyperlipidemia)    Hypothyroidism    MDD (major depressive disorder), recurrent episode, moderate ([place])    h/o SI after lost husband unexpectedly 2015   Microscopic colitis 05/2014   by colonoscopy, lymphocytic, zoloft related   Myocardial infarction ([place]) 2003   Osteoporosis    DEXA 03/2013 T -3.1, DEXA 10//2016 -2.9 hip, -2.2 spine   Pancreatic cyst    Dr Ralene Ok, no f/u needed   Prediabetes 2014   Subclavian steal syndrome    Urine incontinence    Past Surgical History:  Procedure Laterality Date   APPENDECTOMY  1997   with hysterectomy   [place]   lower   BREAST LUMPECTOMY Left 1977   benign   COLONOSCOPY WITH PROPOFOL N/A 05/23/2014   microscopic colitis - Garlan Fair, MD   CORONARY ARTERY BYPASS GRAFT  2003   x 5 v   ESOPHAGOGASTRODUODENOSCOPY (EGD) WITH PROPOFOL N/A 05/23/2014   WNL Garlan Fair, MD   ROTATOR CUFF REPAIR Right [phone]   TOOTH EXTRACTION  yrs ago    TOTAL ABDOMINAL HYSTERECTOMY W/ BILATERAL SALPINGOOPHORECTOMY  1997   endometriosis   TOTAL HIP ARTHROPLASTY Right 11/14/2020   Procedure: TOTAL HIP ARTHROPLASTY ANTERIOR APPROACH;  Surgeon: Gaynelle Arabian, MD;  Location: [facility];  Service: Orthopedics;  Laterality: Right;    Allergies  Allergies  Allergen Reactions   Sulfa Antibiotics Nausea Only   Crestor [Rosuvastatin Calcium] Other (See Comments)    Leg ache    History of Present Illness    Kathleen Williamson is a 81 year old female with the above mentioned PMH who presents today for one year follow up.  She was previously seen by Dr. Ilda Foil and is now followed by Dr. Marlou Porch. Patient had been experiencing intermittent chest pressure that radiated to her neck and shoulder with exertion that was also associated with palpitations.  She underwent exercise treadmill test and suffered anginal type symptoms and EKG changes.  She underwent left heart cath in 02/2002 and was found to have severe three-vessel CAD.  CABG x5 was completed by Dr. Roxy Manns in 03/2002.  She last saw Dr. Marlou Porch on 08/2020 for preoperative clearance for right total hip replacement.  Due to her history she underwent Lexiscan stress test was low risk and showed normal heart pumping function.  She proceeded with hip replacement in August 2022 and tolerated procedure without  complication.   Since last being seen in the office patient reports that she is doing well with no new anginal complaints.  She is currently not dissipating in any physical activity but states that she is planning to increasing her walking regimen in the next few months.  Her blood pressure today was well managed at 128/76 and heart rate was 92.  EKG today did reveal new LBBB however patient is asymptomatic at this time.  She is also having rheumatoid pain in her fingers that is new and is currently following up with Dr. Estanislado Pandy next month.  Patient denies chest pain, palpitations, dyspnea, PND, orthopnea, nausea,  vomiting, dizziness, syncope, edema, weight gain, or early satiety.   Ms. Beahm presents today for 1 month follow-up and to discuss echo results.  She has been doing well from a cardiac perspective and denies any chest pain, shortness of breath, or palpitations.  We reviewed her echo results and decreased ejection fraction seen in detail.  She was able to ask questions regarding neck steps in treatment plan with all questions answered to her satisfaction.  She is scheduled to have consultation for carpal tunnel surgery.  We will provide surgical clearance today during this visit.  Patient denies chest pain, palpitations, dyspnea, PND, orthopnea, nausea, vomiting, dizziness, syncope, edema, weight gain, or early satiety.  Home Medications    Current Outpatient Medications  Medication Sig Dispense Refill   Accu-Chek FastClix Lancets MISC USE AS INSTRUCTED TO CHECK BLOOD SUGAR ONCE DAILY 102 each 1   acetaminophen (TYLENOL) 325 MG tablet Take 650 mg by mouth every 6 (six) hours as needed for moderate pain or headache.     amLODipine (NORVASC) 2.5 MG tablet TAKE ONE TABLET BY MOUTH EVERY MORNING 90 tablet 0   aspirin EC 81 MG tablet Take 81 mg by mouth daily. Swallow whole.     Black Pepper-Turmeric (TURMERIC COMPLEX/BLACK PEPPER) 3-500 MG CAPS Take 1 capsule by mouth daily.     Blood Glucose Monitoring Suppl (ACCU-CHEK GUIDE) w/Device KIT 1 each by Does not apply route as directed. Use as instructed to check blood sugar once daily 1 kit 0   Cholecalciferol (VITAMIN D3) 25 MCG (1000 UT) CAPS Take 1 capsule (1,000 Units total) by mouth daily. 30 capsule    clonazePAM (KLONOPIN) 0.5 MG tablet Take 1 tablet (0.5 mg total) by mouth daily as needed for anxiety (sedation precautions). 30 tablet 0   denosumab (PROLIA) 60 MG/ML SOSY injection Inject 60 mg into the skin every 6 (six) months.     ezetimibe (ZETIA) 10 MG tablet Take 1 tablet (10 mg total) by mouth daily. 90 tablet 3   glucose blood (ONETOUCH  VERIO) test strip Use as instructed to check blood sugar once a day 100 each 3   levothyroxine (SYNTHROID) 50 MCG tablet TAKE ONE TABLET BY MOUTH ONCE DAILY 90 tablet 2   loperamide (IMODIUM A-D) 2 MG tablet Take 2 mg by mouth 4 (four) times daily as needed for diarrhea or loose stools.     mirtazapine (REMERON) 15 MG tablet TAKE ONE TABLET BY MOUTH EVERYDAY AT BEDTIME 30 tablet 6   nitroGLYCERIN (NITROLINGUAL) 0.4 MG/SPRAY spray Place 1 spray under the tongue every 5 (five) minutes as needed. (Patient taking differently: Place 1 spray under the tongue every 5 (five) minutes as needed for chest pain.) 12 g prn   pravastatin (PRAVACHOL) 80 MG tablet TAKE ONE TABLET BY MOUTH EVERYDAY AT BEDTIME. Please keep upcoming appointment for future refills. Thank  you. 30 tablet 0   sacubitril-valsartan (ENTRESTO) 24-26 MG Take 1 tablet by mouth 2 (two) times daily. 60 tablet 11   venlafaxine XR (EFFEXOR-XR) 75 MG 24 hr capsule Take 3 capsules (225 mg total) by mouth every morning. 270 capsule 1   vitamin B-12 (CYANOCOBALAMIN) 1000 MCG tablet Take 1 tablet (1,000 mcg total) by mouth daily.     No current facility-administered medications for this visit.     Review of Systems  Please see the history of present illness.    (+) Bilateral wrist numbness  All other systems reviewed and are otherwise negative except as noted above.  Physical Exam    Wt Readings from Last 3 Encounters:  12/04/21 133 lb 3.2 oz (60.4 kg)  10/31/21 132 lb 9.6 oz (60.1 kg)  09/17/21 131 lb 2 oz (59.5 kg)   VS: Vitals:   12/04/21 1025  BP: 126/68  Pulse: 85  SpO2: 98%  ,Body mass index is 23.6 kg/m.  Constitutional:      Appearance: Healthy appearance. Not in distress.  Neck:     Vascular: JVD normal.  Pulmonary:     Effort: Pulmonary effort is normal.     Breath sounds: No wheezing. No rales. Diminished in the bases Cardiovascular:     Normal rate. Regular rhythm. Normal S1. Normal S2.      Murmurs: There is no  murmur.  Edema:    Peripheral edema absent.  Abdominal:     Palpations: Abdomen is soft non tender. There is no hepatomegaly.  Skin:    General: Skin is warm and dry.  Neurological:     General: No focal deficit present.     Mental Status: Alert and oriented to person, place and time.     Cranial Nerves: Cranial nerves are intact.  EKG/LABS/Other Studies Reviewed    ECG personally reviewed by me today -none completed today   Lab Results  Component Value Date   WBC 9.1 08/21/2021   HGB 11.5 (L) 08/21/2021   HCT 36.0 08/21/2021   MCV 87.7 08/21/2021   PLT 332.0 08/21/2021   Lab Results  Component Value Date   CREATININE 1.43 (H) 09/17/2021   BUN 19 09/17/2021   NA 139 09/17/2021   K 4.1 09/17/2021   CL 107 09/17/2021   CO2 21 09/17/2021   Lab Results  Component Value Date   ALT 13 09/17/2021   AST 14 09/17/2021   ALKPHOS 55 09/17/2021   BILITOT 0.2 09/17/2021   Lab Results  Component Value Date   CHOL 149 09/24/2021   HDL 45.90 09/24/2021   LDLCALC 73 09/24/2021   LDLDIRECT 79.0 09/17/2021   TRIG 148.0 09/24/2021   CHOLHDL 3 09/24/2021    Lab Results  Component Value Date   HGBA1C 7.1 (A) 06/18/2021    Assessment & Plan    1.  Coronary artery disease: - s/p CABG x 5 in 2003 with LIMA-LAD, IMA to PDA,SVG-diagonal, SVG-OM 2-OM,  -Patient denies any anginal symptoms at this time -Continue pravastatin 80 mg daily, Zetia 10 mg daily, and ASA 81 mg -Patient encouraged to increase physical activity to at least 150 minutes/week as tolerated  2.  Carotid artery disease: -Patient had last carotid duplex 06/2020 and readings were stable compared to previous -Patient will continue statin therapy along with 81 mg ASA as noted above  3. Hyperlipidemia: -Patient's last LDL was 73 slightly above goal of 70 -Continue statin therapy as noted above -Increase physical activity as tolerated  4.  Chronic kidney disease stage IIIa: -Last creatinine was 1.43 -Currently  followed by PCP   5.  New LBBB: -2D echo results reviewed and revealed reduced EF of 30-35%.  Results discussed with Dr. Marlou Porch who was in agreement with starting patient on medical therapy. -Start Delene Loll 24/26 twice daily -Repeat 2D echo and 3 months -BMET in 1 week  6.  Preop surgical clearance:  The patient affirms she has been doing well without any new cardiac symptoms. They are able to achieve 4.64 METS without cardiac limitations. Therefore, based on ACC/AHA guidelines, the patient would be at acceptable risk for the planned procedure without further cardiovascular testing. The patient was advised that if she develops new symptoms prior to surgery to contact our office to arrange for a follow-up visit, and she verbalized understanding.   Ms. Cellucci's perioperative risk of a major cardiac event is 6.6% according to the Revised Cardiac Risk Index (RCRI).  Therefore, she is at low risk for perioperative complications.   Her functional capacity is fair at 4.64 METs according to the [facility] Activity Status Index (DASI).  Recommendations: According to ACC/AHA guidelines, no further cardiovascular testing needed.  The patient may proceed to surgery at acceptable risk.   Antiplatelet and/or Anticoagulation Recommendations: Aspirin can be held for 5 days prior to her surgery.  Please resume Aspirin post operatively when it is felt to be safe from a bleeding standpoint.    Disposition: Follow-up with Candee Furbish, MD or APP in 3 months    Medication Adjustments/Labs and Tests Ordered: Current medicines are reviewed at length with the patient today.  Concerns regarding medicines are outlined above.   Signed, Mable Fill, Marissa Nestle, NP 12/04/2021, 11:00 AM [place]

## 2021-11-28 NOTE — Telephone Encounter (Signed)
Placed form in Dr. G's box.  

## 2021-11-29 NOTE — Telephone Encounter (Addendum)
Spoke with pt notifying her the form is ready but she will need to complete the top of the form with her information.  I also notified her there may be a $29.00 fee for Dr. Darnell Level to fill out the form.  Pt verbalizes understanding and will pick up form today.  [Placed form at front office.  Made copy to scan and 1 for billing.]]

## 2021-11-29 NOTE — Telephone Encounter (Addendum)
Filled and in Lisa's box.  She needs to fill out first form.

## 2021-12-04 ENCOUNTER — Ambulatory Visit: Payer: PPO | Admitting: Nurse Practitioner

## 2021-12-04 ENCOUNTER — Encounter: Payer: Self-pay | Admitting: Nurse Practitioner

## 2021-12-04 ENCOUNTER — Telehealth: Payer: Self-pay | Admitting: Family Medicine

## 2021-12-04 VITALS — BP 126/68 | HR 85 | Ht 63.0 in | Wt 133.2 lb

## 2021-12-04 DIAGNOSIS — N183 Chronic kidney disease, stage 3 unspecified: Secondary | ICD-10-CM

## 2021-12-04 DIAGNOSIS — E1169 Type 2 diabetes mellitus with other specified complication: Secondary | ICD-10-CM | POA: Diagnosis not present

## 2021-12-04 DIAGNOSIS — I2510 Atherosclerotic heart disease of native coronary artery without angina pectoris: Secondary | ICD-10-CM

## 2021-12-04 DIAGNOSIS — E785 Hyperlipidemia, unspecified: Secondary | ICD-10-CM | POA: Diagnosis not present

## 2021-12-04 DIAGNOSIS — I6523 Occlusion and stenosis of bilateral carotid arteries: Secondary | ICD-10-CM | POA: Diagnosis not present

## 2021-12-04 DIAGNOSIS — E1122 Type 2 diabetes mellitus with diabetic chronic kidney disease: Secondary | ICD-10-CM

## 2021-12-04 DIAGNOSIS — Z01810 Encounter for preprocedural cardiovascular examination: Secondary | ICD-10-CM

## 2021-12-04 DIAGNOSIS — I447 Left bundle-branch block, unspecified: Secondary | ICD-10-CM

## 2021-12-04 MED ORDER — ENTRESTO 24-26 MG PO TABS: 1.0000 | ORAL_TABLET | Freq: Two times a day (BID) | ORAL | 11 refills | Status: DC

## 2021-12-04 NOTE — Patient Instructions (Signed)
Medication Instructions:  Your physician has recommended you make the following change in your medication:  START ENTRESTO 24-26 MG TWICE DAILY.   *If you need a refill on your cardiac medications before your next appointment, please call your pharmacy*   Lab Work: TO BE DONE IN 1 WEEK: BMET  If you have labs (blood work) drawn today and your tests are completely normal, you will receive your results only by: [place] (if you have MyChart) OR A paper copy in the mail If you have any lab test that is abnormal or we need to change your treatment, we will call you to review the results.   Testing/Procedures: Your physician has requested that you have an echocardiogram. Echocardiography is a painless test that uses sound waves to create images of your heart. It provides your doctor with information about the size and shape of your heart and how well your heart's chambers and valves are working. This procedure takes approximately one hour. There are no restrictions for this procedure.   Follow-Up: At [facility], you and your health needs are our priority.  As part of our continuing mission to provide you with exceptional heart care, we have created designated Provider Care Teams.  These Care Teams include your primary Cardiologist (physician) and Advanced Practice Providers (APPs -  Physician Assistants and Nurse Practitioners) who all work together to provide you with the care you need, when you need it.  We recommend signing up for the patient portal called "MyChart".  Sign up information is provided on this After Visit Summary.  MyChart is used to connect with patients for Virtual Visits (Telemedicine).  Patients are able to view lab/test results, encounter notes, upcoming appointments, etc.  Non-urgent messages can be sent to your provider as well.   To learn more about what you can do with MyChart, go to [email or web address].    Your next appointment:   3 month(s)  The format  for your next appointment:   In Person  Provider:   Candee Furbish, MD or Ambrose Pancoast, NP  Important Information About Sugar

## 2021-12-04 NOTE — Telephone Encounter (Signed)
Patient called and was returning a phone call.  

## 2021-12-04 NOTE — Telephone Encounter (Signed)
Rtn pt's call.  States she got a call from me. I reminded her that her [place] [employer] ppw is ready to pick up (see 11/26/21 phn note). States she picked up ppw 11/29/21.

## 2021-12-05 ENCOUNTER — Other Ambulatory Visit: Payer: Self-pay | Admitting: Cardiology

## 2021-12-06 ENCOUNTER — Telehealth: Payer: Self-pay | Admitting: Nurse Practitioner

## 2021-12-06 NOTE — Telephone Encounter (Signed)
Pt returning a call for echo results. Pt states she will be out today and she ask that you leave a detailed message if she doesn't answer

## 2021-12-06 NOTE — Telephone Encounter (Signed)
Returned call to patient and discussed echo results.  Patient states she reviewed results at appt with Kathleen Pancoast, NP on 12/04/21.  Entresto ordered, patient reports she is still waiting for delivery of medication. Lab appt rescheduled for 9/8 per patient request.  Patient expressed appreciation for assistance.

## 2021-12-09 ENCOUNTER — Telehealth: Payer: Self-pay

## 2021-12-09 NOTE — Chronic Care Management (AMB) (Cosign Needed)
Chronic Care Management Pharmacy Assistant   Name: Kathleen Williamson  MRN: [id number] DOB: 1940-09-24   Reason for Encounter: Medication Adherence and Delivery Coordination    Recent office visits:  None since last CCM contact   Recent consult visits:  12/04/21-Ernest Dick,Jr,NP(cardio)-f/u CAD, labs ordered, EKG, start Entresto-24-21m take 1 tablet breakfast, 1 tablet bedtime, f/u 3 months   Hospital visits:  None in previous 6 months  Medications: Outpatient Encounter Medications as of 12/09/2021  Medication Sig   Accu-Chek FastClix Lancets MISC USE AS INSTRUCTED TO CHECK BLOOD SUGAR ONCE DAILY   acetaminophen (TYLENOL) 325 MG tablet Take 650 mg by mouth every 6 (six) hours as needed for moderate pain or headache.   amLODipine (NORVASC) 2.5 MG tablet TAKE ONE TABLET BY MOUTH EVERY MORNING   aspirin EC 81 MG tablet Take 81 mg by mouth daily. Swallow whole.   Black Pepper-Turmeric (TURMERIC COMPLEX/BLACK PEPPER) 3-500 MG CAPS Take 1 capsule by mouth daily.   Blood Glucose Monitoring Suppl (ACCU-CHEK GUIDE) w/Device KIT 1 each by Does not apply route as directed. Use as instructed to check blood sugar once daily   Cholecalciferol (VITAMIN D3) 25 MCG (1000 UT) CAPS Take 1 capsule (1,000 Units total) by mouth daily.   clonazePAM (KLONOPIN) 0.5 MG tablet Take 1 tablet (0.5 mg total) by mouth daily as needed for anxiety (sedation precautions).   denosumab (PROLIA) 60 MG/ML SOSY injection Inject 60 mg into the skin every 6 (six) months.   ezetimibe (ZETIA) 10 MG tablet Take 1 tablet (10 mg total) by mouth daily.   glucose blood (ONETOUCH VERIO) test strip Use as instructed to check blood sugar once a day   levothyroxine (SYNTHROID) 50 MCG tablet TAKE ONE TABLET BY MOUTH ONCE DAILY   loperamide (IMODIUM A-D) 2 MG tablet Take 2 mg by mouth 4 (four) times daily as needed for diarrhea or loose stools.   mirtazapine (REMERON) 15 MG tablet TAKE ONE TABLET BY MOUTH EVERYDAY AT BEDTIME    nitroGLYCERIN (NITROLINGUAL) 0.4 MG/SPRAY spray Place 1 spray under the tongue every 5 (five) minutes as needed. (Patient taking differently: Place 1 spray under the tongue every 5 (five) minutes as needed for chest pain.)   pravastatin (PRAVACHOL) 80 MG tablet TAKE ONE TABLET BY MOUTH EVERYDAY AT BEDTIME   sacubitril-valsartan (ENTRESTO) 24-26 MG Take 1 tablet by mouth 2 (two) times daily.   venlafaxine XR (EFFEXOR-XR) 75 MG 24 hr capsule Take 3 capsules (225 mg total) by mouth every morning.   vitamin B-12 (CYANOCOBALAMIN) 1000 MCG tablet Take 1 tablet (1,000 mcg total) by mouth daily.   No facility-administered encounter medications on file as of 12/09/2021.   BP Readings from Last 3 Encounters:  12/04/21 126/68  10/31/21 128/76  09/17/21 136/70    Lab Results  Component Value Date   HGBA1C 7.1 (A) 06/18/2021      Recent OV, Consult or Hospital visit:  Recent medication changes indicated: 12/04/21-Ernest Dick,Jr,NP(cardio)-f/u CAD, labs ordered, EKG, start Entresto-24-228mtake 1 tablet breakfast, 1 tablet bedtime, f/u 3 months    Last adherence delivery date:11/18/21      Patient is due for next adherence delivery on: 12/18/21  Spoke with patient on 12/09/21 reviewed medications and coordinated delivery.  This delivery to include: Adherence Packaging  30 Days  Packs: Co Q10 100 mg- take 1 tablet bedtime Venlafaxine ER 75 mg - take 3 tablets  breakfast Mirtazapine 15 mg - take 1 tablet bedtime Pravastatin 80 mg - take 1  tablet bedtime Levothyroxine 50 mcg - take 1 tablet breakfast Aspirin 81 mg - take 1 tablet breakfast Ezetimibe 10 mg - take 1 tablet breakfast Amlodipine 2.5 mg - take 1 tablet by mouth daily breakfast Entresto 24-66m-take 1 tablet breakfast, 1 tablet bedtime   VIAL medications: none   Patient declined the following medications this month: none   Any concerns about your medications? Yes patient states a slight HA with the morning dose of Entresto   How  often do you forget or accidentally miss a dose? Never  Do you use a pillbox? No  Is patient in packaging Yes  What is the date on your next pill pack?12/09/21  Any concerns or issues with your packaging?satisfied with process   Refills requested from providers include:  pravastatin   Confirmed delivery date of 12/18/21, advised patient that pharmacy will contact them the morning of delivery.  Recent blood pressure readings are as follows: none available-Patient  will get campus nurse to take daily with starting entresto    Annual wellness visit in last year? Yes Most Recent BP reading:126/68  If Diabetic: Most recent A1C reading:7.1 Last eye exam / retinopathy screening:UTD Last diabetic foot exam:UTD  [facility]  .The patient states the MD office gave her a form for a 30 day free trial for the pharmacy and also gave the patient a co-pay card for 10$ for the entresto and told the patient to give to pharmacy.  Pharmacist addendum: EDelene Lollfree trial card given to pharmacy, which should work for 1st fill.  $10 copay card will not work with MAT[employer]Tgoing forward. -Charlton Haws RAbercrombie  LCharlene Brooke CPP notified  CCM  01/30/22  [place] [phone]

## 2021-12-11 ENCOUNTER — Telehealth: Payer: Self-pay

## 2021-12-11 ENCOUNTER — Other Ambulatory Visit: Payer: PPO

## 2021-12-11 NOTE — Telephone Encounter (Addendum)
Per HTA will owe 20% for Prolia. Need to discuss with patient and see if she is ok with getting this injection. Per LOV in June it was noted that patient declined repeat at that time. Next injection due 12/20/21

## 2021-12-13 NOTE — Telephone Encounter (Signed)
Spoke with patient. Patient would like to continue with Prolia injections. Lab 12/17/21 and NV 12/27/21

## 2021-12-17 ENCOUNTER — Other Ambulatory Visit (INDEPENDENT_AMBULATORY_CARE_PROVIDER_SITE_OTHER): Payer: PPO

## 2021-12-17 DIAGNOSIS — M810 Age-related osteoporosis without current pathological fracture: Secondary | ICD-10-CM | POA: Diagnosis not present

## 2021-12-17 LAB — BASIC METABOLIC PANEL
BUN: 24 mg/dL — ABNORMAL HIGH (ref 6–23)
CO2: 22 mEq/L (ref 19–32)
Calcium: 9.4 mg/dL (ref 8.4–10.5)
Chloride: 106 mEq/L (ref 96–112)
Creatinine, Ser: 1.56 mg/dL — ABNORMAL HIGH (ref 0.40–1.20)
GFR: 31.32 mL/min — ABNORMAL LOW (ref >60.00)
Glucose, Bld: 152 mg/dL — ABNORMAL HIGH (ref 70–99)
Potassium: 4.3 mEq/L (ref 3.5–5.1)
Sodium: 138 mEq/L (ref 135–145)

## 2021-12-19 ENCOUNTER — Other Ambulatory Visit: Payer: Self-pay | Admitting: Family Medicine

## 2021-12-19 DIAGNOSIS — E1122 Type 2 diabetes mellitus with diabetic chronic kidney disease: Secondary | ICD-10-CM

## 2021-12-20 ENCOUNTER — Ambulatory Visit: Payer: PPO | Attending: Nurse Practitioner

## 2021-12-20 DIAGNOSIS — I6523 Occlusion and stenosis of bilateral carotid arteries: Secondary | ICD-10-CM | POA: Diagnosis not present

## 2021-12-20 DIAGNOSIS — I2510 Atherosclerotic heart disease of native coronary artery without angina pectoris: Secondary | ICD-10-CM

## 2021-12-21 LAB — BASIC METABOLIC PANEL
BUN/Creatinine Ratio: 15 (ref 12–28)
BUN: 22 mg/dL (ref 8–27)
CO2: 18 mmol/L — ABNORMAL LOW (ref 20–29)
Calcium: 9.9 mg/dL (ref 8.7–10.3)
Chloride: 104 mmol/L (ref 96–106)
Creatinine, Ser: 1.51 mg/dL — ABNORMAL HIGH (ref 0.57–1.00)
Glucose: 217 mg/dL — ABNORMAL HIGH (ref 70–99)
Potassium: 4.4 mmol/L (ref 3.5–5.2)
Sodium: 141 mmol/L (ref 134–144)
eGFR: 35 mL/min/{1.73_m2} — ABNORMAL LOW (ref >59)

## 2021-12-21 LAB — SPECIMEN STATUS REPORT

## 2021-12-23 NOTE — Telephone Encounter (Signed)
Per Dr Caren Hazy notify kidney function remains impaired but stable, calcium level ok.  CrCl = 27  Ok to proceed with prolia but I do recommend repeat BMP 1 wk after shot. New lab ordered.  Would also recommend increasing vitamin D to 2000 iu daily as last levels were a bit low - I have ordered this as well.   Patient was advised. See lab result note.

## 2021-12-24 ENCOUNTER — Telehealth: Payer: Self-pay

## 2021-12-24 DIAGNOSIS — G5603 Carpal tunnel syndrome, bilateral upper limbs: Secondary | ICD-10-CM | POA: Diagnosis not present

## 2021-12-24 NOTE — Telephone Encounter (Signed)
Please advise [name] to hold Colon for 1 week and then resume current dose twice a day to rule out possible correlation of side effects with medication.  If side effects return we will discontinue and select a different therapy moving forward.  Case discussed with Dr. Marlou Porch who is  in agreement with current plan.  Please let me know if you have any additional questions.   Ambrose Pancoast, NP    Called patient with Ambrose Pancoast, NP recommendation. Patient verbalized understanding and she will call back if she starts to have symptoms again.

## 2021-12-24 NOTE — Telephone Encounter (Signed)
Kathleen Campbell, LPN  6/43/5391 22:58 AM EDT Back to Top    Pt aware of lab results and while giving results Pt mentioned she hade decreased Entresto to 1 every day due to face and scalp itching and also diarrhea Per pt side effects went away when decreased to one a day Will forward to Ambrose Pancoast NP to review ./cy  Sent to Jaquelyn Bitter 12/23/21.

## 2021-12-24 NOTE — Telephone Encounter (Signed)
-----   Message from Marylu Lund., NP sent at 12/21/2021 12:19 PM EDT ----- Please let Kathleen Williamson know that her renal function is stable and potassium is within normal limits.  Plan:  Please continue Entresto 24/26 mg twice daily.  We will discuss titration based on your echo results at follow-up in 3 months.  Please let me know if you have any additional questions.  Ambrose Pancoast, NP

## 2021-12-25 ENCOUNTER — Encounter: Payer: Self-pay | Admitting: Family Medicine

## 2021-12-27 ENCOUNTER — Ambulatory Visit (INDEPENDENT_AMBULATORY_CARE_PROVIDER_SITE_OTHER): Payer: PPO

## 2021-12-27 DIAGNOSIS — M810 Age-related osteoporosis without current pathological fracture: Secondary | ICD-10-CM | POA: Diagnosis not present

## 2021-12-27 MED ORDER — DENOSUMAB 60 MG/ML ~~LOC~~ SOSY
60.0000 mg | PREFILLED_SYRINGE | Freq: Once | SUBCUTANEOUS | Status: AC
  Administered 2021-12-27: 60 mg via SUBCUTANEOUS

## 2021-12-27 NOTE — Progress Notes (Signed)
Per orders of Dr. [name], injection of Prolia 60 mg in left deltoid given by Ozzie Hoyle. Patient tolerated injection well.

## 2022-01-03 ENCOUNTER — Other Ambulatory Visit: Payer: Self-pay | Admitting: Family Medicine

## 2022-01-03 ENCOUNTER — Other Ambulatory Visit (INDEPENDENT_AMBULATORY_CARE_PROVIDER_SITE_OTHER): Payer: PPO

## 2022-01-03 DIAGNOSIS — E1122 Type 2 diabetes mellitus with diabetic chronic kidney disease: Secondary | ICD-10-CM | POA: Diagnosis not present

## 2022-01-03 DIAGNOSIS — N183 Chronic kidney disease, stage 3 unspecified: Secondary | ICD-10-CM

## 2022-01-03 LAB — RENAL FUNCTION PANEL
Albumin: 3.7 g/dL (ref 3.5–5.2)
BUN: 22 mg/dL (ref 6–23)
CO2: 21 mEq/L (ref 19–32)
Calcium: 8.3 mg/dL — ABNORMAL LOW (ref 8.4–10.5)
Chloride: 108 mEq/L (ref 96–112)
Creatinine, Ser: 1.52 mg/dL — ABNORMAL HIGH (ref 0.40–1.20)
GFR: 32.3 mL/min — ABNORMAL LOW (ref >60.00)
Glucose, Bld: 167 mg/dL — ABNORMAL HIGH (ref 70–99)
Phosphorus: 3.3 mg/dL (ref 2.3–4.6)
Potassium: 4.2 mEq/L (ref 3.5–5.1)
Sodium: 139 mEq/L (ref 135–145)

## 2022-01-03 LAB — VITAMIN D 25 HYDROXY (VIT D DEFICIENCY, FRACTURES): VITD: 44.38 ng/mL (ref 30.00–100.00)

## 2022-01-03 NOTE — Telephone Encounter (Signed)
Mirtazapine Last filled:  12/13/21, #30 Last OV:  09/17/21, AWV Next OV:  01/17/22, 4 mo f/u

## 2022-01-07 ENCOUNTER — Telehealth: Payer: Self-pay

## 2022-01-07 ENCOUNTER — Telehealth: Payer: Self-pay | Admitting: Nurse Practitioner

## 2022-01-07 DIAGNOSIS — I6523 Occlusion and stenosis of bilateral carotid arteries: Secondary | ICD-10-CM

## 2022-01-07 DIAGNOSIS — I2510 Atherosclerotic heart disease of native coronary artery without angina pectoris: Secondary | ICD-10-CM

## 2022-01-07 DIAGNOSIS — E782 Mixed hyperlipidemia: Secondary | ICD-10-CM

## 2022-01-07 MED ORDER — LISINOPRIL 10 MG PO TABS: 10.0000 mg | ORAL_TABLET | Freq: Every day | ORAL | 3 refills | Status: DC

## 2022-01-07 NOTE — Telephone Encounter (Signed)
Patient was holding Entresto due to suspected side effects. She restarted it at the end of last week and her symptoms of diarrhea returned on Sunday. Advised patient to discontinue Entresto and I will send a message to Jaquelyn Bitter to see what his recommendation is. (See note from 9/12).

## 2022-01-07 NOTE — Telephone Encounter (Signed)
Spoke with the patient and advised her on recommendations from Ambrose Pancoast, NP. Patient verbalized understanding. Labs have been scheduled.

## 2022-01-07 NOTE — Chronic Care Management (AMB) (Addendum)
Chronic Care Management Pharmacy Assistant   Name: Kathleen Williamson  MRN: [id number] DOB: 08-28-1940   Reason for Encounter: Medication Adherence and Delivery Coordination   Recent office visits:  12/27/21-Family Medicine-prolia injection  Recent consult visits:  01/07/22-Cardiology-Telephone call to change medication from Entresto to Lisinopril 37m take 1 tablet breakfast.  Hospital visits:  None in previous 6 months  Medications: Outpatient Encounter Medications as of 01/07/2022  Medication Sig   Accu-Chek FastClix Lancets MISC USE AS INSTRUCTED TO CHECK BLOOD SUGAR ONCE DAILY   acetaminophen (TYLENOL) 325 MG tablet Take 650 mg by mouth every 6 (six) hours as needed for moderate pain or headache.   amLODipine (NORVASC) 2.5 MG tablet TAKE ONE TABLET BY MOUTH EVERY MORNING   aspirin EC 81 MG tablet Take 81 mg by mouth daily. Swallow whole.   Black Pepper-Turmeric (TURMERIC COMPLEX/BLACK PEPPER) 3-500 MG CAPS Take 1 capsule by mouth daily.   Blood Glucose Monitoring Suppl (ACCU-CHEK GUIDE) w/Device KIT 1 each by Does not apply route as directed. Use as instructed to check blood sugar once daily   Cholecalciferol (VITAMIN D3) 25 MCG (1000 UT) CAPS Take 1 capsule (1,000 Units total) by mouth daily.   clonazePAM (KLONOPIN) 0.5 MG tablet Take 1 tablet (0.5 mg total) by mouth daily as needed for anxiety (sedation precautions).   denosumab (PROLIA) 60 MG/ML SOSY injection Inject 60 mg into the skin every 6 (six) months.   ezetimibe (ZETIA) 10 MG tablet Take 1 tablet (10 mg total) by mouth daily.   glucose blood (ONETOUCH VERIO) test strip Use as instructed to check blood sugar once a day   levothyroxine (SYNTHROID) 50 MCG tablet TAKE ONE TABLET BY MOUTH ONCE DAILY   loperamide (IMODIUM A-D) 2 MG tablet Take 2 mg by mouth 4 (four) times daily as needed for diarrhea or loose stools.   mirtazapine (REMERON) 15 MG tablet TAKE ONE TABLET BY MOUTH EVERYDAY AT BEDTIME   nitroGLYCERIN  (NITROLINGUAL) 0.4 MG/SPRAY spray Place 1 spray under the tongue every 5 (five) minutes as needed. (Patient taking differently: Place 1 spray under the tongue every 5 (five) minutes as needed for chest pain.)   pravastatin (PRAVACHOL) 80 MG tablet TAKE ONE TABLET BY MOUTH EVERYDAY AT BEDTIME   sacubitril-valsartan (ENTRESTO) 24-26 MG Take 1 tablet by mouth 2 (two) times daily.   venlafaxine XR (EFFEXOR-XR) 75 MG 24 hr capsule Take 3 capsules (225 mg total) by mouth every morning.   vitamin B-12 (CYANOCOBALAMIN) 1000 MCG tablet Take 1 tablet (1,000 mcg total) by mouth daily.   No facility-administered encounter medications on file as of 01/07/2022.    BP Readings from Last 3 Encounters:  12/04/21 126/68  10/31/21 128/76  09/17/21 136/70    Lab Results  Component Value Date   HGBA1C 7.1 (A) 06/18/2021      No OVs, Consults, or hospital visits since last care coordination call / Pharmacist visit. No medication changes indicated   Last adherence delivery date:12/18/21      Patient is due for next adherence delivery on: 01/16/22  Spoke with patient on 01/07/22 reviewed medications and coordinated delivery.  This delivery to include: Adherence Packaging  30 Days  Packs: Co Q10 100 mg- take 1 tablet bedtime Venlafaxine ER 75 mg - take 3 tablets  breakfast Mirtazapine 15 mg - take 1 tablet bedtime Pravastatin 80 mg - take 1 tablet bedtime Levothyroxine 50 mcg - take 1 tablet breakfast Aspirin 81 mg - take 1 tablet breakfast Ezetimibe 10  mg - take 1 tablet breakfast Amlodipine 2.5 mg - take 1 tablet by mouth daily breakfast Lisinopril 61m -take 1 tablet breakfast  VIAL medications: none   Patient declined the following medications this month: EDelene Lollmay be coming off medication list due to unable to tolerate   Any concerns about your medications? Yes  unable to tolerate entresto, diarrhea and GI pain  How often do you forget or accidentally miss a dose? Never  Do you use a  pillbox? No  Is patient in packaging Yes  What is the date on your next pill pack? 01/07/22  Any concerns or issues with your packaging? satisfied   Refills requested from providers include: mirtazapine  Confirmed delivery date of 01/16/22, advised patient that pharmacy will contact them the morning of delivery.  Recent blood pressure readings are as follows: none available    Recent blood glucose readings are as follows:none available     Annual wellness visit in last year? Yes Most Recent BP reading: 126/68  12/04/21   [facility].  LCharlene Brooke CPP notified  [place] [phone]

## 2022-01-07 NOTE — Telephone Encounter (Signed)
Pt c/o medication issue:  1. Name of Medication:   sacubitril-valsartan (ENTRESTO) 24-26 MG    2. How are you currently taking this medication (dosage and times per day)?   3. Are you having a reaction (difficulty breathing--STAT)?   4. What is your medication issue? Pt told to call back after starting entresto back, pt states she is still not doing good on this medication.

## 2022-01-17 ENCOUNTER — Ambulatory Visit (INDEPENDENT_AMBULATORY_CARE_PROVIDER_SITE_OTHER): Payer: PPO | Admitting: Family Medicine

## 2022-01-17 ENCOUNTER — Encounter: Payer: Self-pay | Admitting: Family Medicine

## 2022-01-17 VITALS — BP 136/58 | HR 84 | Temp 97.1°F | Ht 63.0 in | Wt 134.0 lb

## 2022-01-17 DIAGNOSIS — G5603 Carpal tunnel syndrome, bilateral upper limbs: Secondary | ICD-10-CM

## 2022-01-17 DIAGNOSIS — F332 Major depressive disorder, recurrent severe without psychotic features: Secondary | ICD-10-CM

## 2022-01-17 DIAGNOSIS — I447 Left bundle-branch block, unspecified: Secondary | ICD-10-CM | POA: Insufficient documentation

## 2022-01-17 DIAGNOSIS — M810 Age-related osteoporosis without current pathological fracture: Secondary | ICD-10-CM | POA: Diagnosis not present

## 2022-01-17 DIAGNOSIS — I5022 Chronic systolic (congestive) heart failure: Secondary | ICD-10-CM | POA: Diagnosis not present

## 2022-01-17 DIAGNOSIS — E118 Type 2 diabetes mellitus with unspecified complications: Secondary | ICD-10-CM | POA: Diagnosis not present

## 2022-01-17 MED ORDER — VITAMIN D3 25 MCG (1000 UT) PO CAPS: 2.0000 | ORAL_CAPSULE | Freq: Every day | ORAL | Status: AC

## 2022-01-17 NOTE — Assessment & Plan Note (Signed)
Stable period - continue current regimen.

## 2022-01-17 NOTE — Assessment & Plan Note (Signed)
Continue q29moprolia.  Continue vit D 2000 IU daily.

## 2022-01-17 NOTE — Assessment & Plan Note (Addendum)
Chronic, diet controlled. Will need A1c next labwork.

## 2022-01-17 NOTE — Progress Notes (Signed)
Patient ID: Kathleen Williamson, female    DOB: 1940/07/10, 81 y.o.   MRN: [id number]  This visit was conducted in person.  BP (!) 136/58   Pulse 84   Temp (!) 97.1 F (36.2 C) (Temporal)   Ht $R'5\' 3"'oL$  (1.6 m)   Wt 134 lb (60.8 kg)   LMP  (LMP Unknown)   SpO2 97%   BMI 23.74 kg/m    CC: 4 mo f/u visit  Subjective:   HPI: Kathleen Williamson is a 81 y.o. female presenting on 01/17/2022 for Follow-up (4 month f/u)   Diarrhea related to entresto - now cardiology has transitioned to plain lisinopril   Longstanding MDD - continues effexor 225 XR daily with remeron $RemoveBefo'15mg'XSiEMHdQhAc$  nightly, as well as klonopin 0.$RemoveBefor'5mg'yBxEuJqcsQYh$  daily PRN. She has previously declined counseling and psychiatry eval.   Seeing cardiology for CAD, carotid disease, new LBBB with reduced EF to 30-35%.   Bilateral hand pain - xrays showed erosive osteoarthritis  Personal h/o dermatomyositis  Fmhx RA (mother)  She started seeing accupuncturist x4 in [place] with temporary benefit. Upcoming rheumatology appt Estanislado Pandy) 02/2022 - she plans to cancel this as she has been seeing Dr Peggye Ley at [facility] who is a hand/wrist specialist - dx CTS s/p steroid injection into bilateral wrists with benefit, rec surgery as definitive management. Pain improved but constant numbness persists.   OP - last prolia injection 12/27/2021. Also continues vitamin D 2000 IU daily.      Relevant past medical, surgical, family and social history reviewed and updated as indicated. Interim medical history since our last visit reviewed. Allergies and medications reviewed and updated. Outpatient Medications Prior to Visit  Medication Sig Dispense Refill   Accu-Chek FastClix Lancets MISC USE AS INSTRUCTED TO CHECK BLOOD SUGAR ONCE DAILY 102 each 1   acetaminophen (TYLENOL) 325 MG tablet Take 650 mg by mouth every 6 (six) hours as needed for moderate pain or headache.     amLODipine (NORVASC) 2.5 MG tablet TAKE ONE TABLET BY MOUTH EVERY MORNING 90 tablet 0   aspirin EC 81  MG tablet Take 81 mg by mouth daily. Swallow whole.     Black Pepper-Turmeric (TURMERIC COMPLEX/BLACK PEPPER) 3-500 MG CAPS Take 1 capsule by mouth daily.     Blood Glucose Monitoring Suppl (ACCU-CHEK GUIDE) w/Device KIT 1 each by Does not apply route as directed. Use as instructed to check blood sugar once daily 1 kit 0   clonazePAM (KLONOPIN) 0.5 MG tablet Take 1 tablet (0.5 mg total) by mouth daily as needed for anxiety (sedation precautions). 30 tablet 0   denosumab (PROLIA) 60 MG/ML SOSY injection Inject 60 mg into the skin every 6 (six) months.     ezetimibe (ZETIA) 10 MG tablet Take 1 tablet (10 mg total) by mouth daily. 90 tablet 3   glucose blood (ONETOUCH VERIO) test strip Use as instructed to check blood sugar once a day 100 each 3   levothyroxine (SYNTHROID) 50 MCG tablet TAKE ONE TABLET BY MOUTH ONCE DAILY 90 tablet 2   lisinopril (ZESTRIL) 10 MG tablet Take 1 tablet (10 mg total) by mouth daily. 90 tablet 3   loperamide (IMODIUM A-D) 2 MG tablet Take 2 mg by mouth 4 (four) times daily as needed for diarrhea or loose stools.     mirtazapine (REMERON) 15 MG tablet TAKE ONE TABLET BY MOUTH EVERYDAY AT BEDTIME 30 tablet 6   nitroGLYCERIN (NITROLINGUAL) 0.4 MG/SPRAY spray Place 1 spray under the tongue every 5 (five)  minutes as needed. (Patient taking differently: Place 1 spray under the tongue every 5 (five) minutes as needed for chest pain.) 12 g prn   pravastatin (PRAVACHOL) 80 MG tablet TAKE ONE TABLET BY MOUTH EVERYDAY AT BEDTIME 90 tablet 3   venlafaxine XR (EFFEXOR-XR) 75 MG 24 hr capsule Take 3 capsules (225 mg total) by mouth every morning. 270 capsule 1   vitamin B-12 (CYANOCOBALAMIN) 1000 MCG tablet Take 1 tablet (1,000 mcg total) by mouth daily.     Cholecalciferol (VITAMIN D3) 25 MCG (1000 UT) CAPS Take 1 capsule (1,000 Units total) by mouth daily. 30 capsule    sacubitril-valsartan (ENTRESTO) 24-26 MG Take 1 tablet by mouth 2 (two) times daily. 60 tablet 11   No  facility-administered medications prior to visit.     Per HPI unless specifically indicated in ROS section below Review of Systems  Objective:  BP (!) 136/58   Pulse 84   Temp (!) 97.1 F (36.2 C) (Temporal)   Ht $R'5\' 3"'ii$  (1.6 m)   Wt 134 lb (60.8 kg)   LMP  (LMP Unknown)   SpO2 97%   BMI 23.74 kg/m   Wt Readings from Last 3 Encounters:  01/17/22 134 lb (60.8 kg)  12/04/21 133 lb 3.2 oz (60.4 kg)  10/31/21 132 lb 9.6 oz (60.1 kg)      Physical Exam Vitals and nursing note reviewed.  Constitutional:      Appearance: Normal appearance. She is not ill-appearing.  HENT:     Mouth/Throat:     Mouth: Mucous membranes are moist.     Pharynx: Oropharynx is clear. No oropharyngeal exudate or posterior oropharyngeal erythema.  Eyes:     Extraocular Movements: Extraocular movements intact.     Conjunctiva/sclera: Conjunctivae normal.     Pupils: Pupils are equal, round, and reactive to light.  Cardiovascular:     Rate and Rhythm: Normal rate and regular rhythm.     Pulses: Normal pulses.     Heart sounds: Normal heart sounds. No murmur heard. Pulmonary:     Effort: Pulmonary effort is normal. No respiratory distress.     Breath sounds: Normal breath sounds. No wheezing, rhonchi or rales.  Musculoskeletal:     Right lower leg: No edema.     Left lower leg: No edema.  Skin:    General: Skin is warm and dry.     Findings: No rash.  Neurological:     Mental Status: She is alert.  Psychiatric:        Mood and Affect: Mood normal.        Behavior: Behavior normal.       Results for orders placed or performed in visit on 01/03/22  VITAMIN D 25 Hydroxy (Vit-D Deficiency, Fractures)  Result Value Ref Range   VITD 44.38 30.00 - 100.00 ng/mL  Renal function panel  Result Value Ref Range   Sodium 139 135 - 145 mEq/L   Potassium 4.2 3.5 - 5.1 mEq/L   Chloride 108 96 - 112 mEq/L   CO2 21 19 - 32 mEq/L   Albumin 3.7 3.5 - 5.2 g/dL   BUN 22 6 - 23 mg/dL   Creatinine, Ser 1.52  (H) 0.40 - 1.20 mg/dL   Glucose, Bld 167 (H) 70 - 99 mg/dL   Phosphorus 3.3 2.3 - 4.6 mg/dL   GFR 32.30 (L) >60.00 mL/min   Calcium 8.3 (L) 8.4 - 10.5 mg/dL   Lab Results  Component Value Date   HGBA1C 7.1 (A)  06/18/2021   Assessment & Plan:   Problem List Items Addressed This Visit     MDD (major depressive disorder), recurrent severe, without psychosis ([place])    Stable period - continue current regimen.       Osteoporosis - Primary    Continue q97mo prolia.  Continue vit D 2000 IU daily.       Relevant Medications   Cholecalciferol (VITAMIN D3) 25 MCG (1000 UT) CAPS   Diabetes mellitus type 2, controlled, with complications (HCC)    Chronic, diet controlled. Will need A1c next labwork.       Bilateral carpal tunnel syndrome    Saw ortho Dr Peggye Ley s/p steroid injections with benefit.       LBBB (left bundle branch block)   Chronic HFrEF (heart failure with reduced ejection fraction) ([place])    Established with cardiology, appreciate their care.  Now on lisinopril $RemoveBefor'10mg'aUoasYbKipTS$  daily.         Meds ordered this encounter  Medications   Cholecalciferol (VITAMIN D3) 25 MCG (1000 UT) CAPS    Sig: Take 2 capsules (2,000 Units total) by mouth daily.    Dispense:  30 capsule   No orders of the defined types were placed in this encounter.    Patient Instructions  Try wrist braces at night time.  Continue vitamin D 2000 units daily.  Good to see you today Return as needed or in 8 months for wellness visit/physical.   Follow up plan: Return in about 8 months (around 09/18/2022) for annual exam, prior fasting for blood work, medicare wellness visit.  [name], MD

## 2022-01-17 NOTE — Patient Instructions (Addendum)
Try wrist braces at night time.  Continue vitamin D 2000 units daily.  Good to see you today Return as needed or in 8 months for wellness visit/physical.

## 2022-01-17 NOTE — Assessment & Plan Note (Signed)
Saw ortho Dr Peggye Ley s/p steroid injections with benefit.

## 2022-01-17 NOTE — Assessment & Plan Note (Signed)
Established with cardiology, appreciate their care.  Now on lisinopril '10mg'$  daily.

## 2022-01-21 ENCOUNTER — Ambulatory Visit: Payer: PPO | Attending: Nurse Practitioner

## 2022-01-21 DIAGNOSIS — I2510 Atherosclerotic heart disease of native coronary artery without angina pectoris: Secondary | ICD-10-CM | POA: Diagnosis not present

## 2022-01-21 DIAGNOSIS — E782 Mixed hyperlipidemia: Secondary | ICD-10-CM | POA: Diagnosis not present

## 2022-01-21 DIAGNOSIS — I6523 Occlusion and stenosis of bilateral carotid arteries: Secondary | ICD-10-CM | POA: Diagnosis not present

## 2022-01-21 LAB — BASIC METABOLIC PANEL
BUN/Creatinine Ratio: 10 — ABNORMAL LOW (ref 12–28)
BUN: 16 mg/dL (ref 8–27)
CO2: 19 mmol/L — ABNORMAL LOW (ref 20–29)
Calcium: 9.5 mg/dL (ref 8.7–10.3)
Chloride: 106 mmol/L (ref 96–106)
Creatinine, Ser: 1.61 mg/dL — ABNORMAL HIGH (ref 0.57–1.00)
Glucose: 140 mg/dL — ABNORMAL HIGH (ref 70–99)
Potassium: 4.5 mmol/L (ref 3.5–5.2)
Sodium: 139 mmol/L (ref 134–144)
eGFR: 32 mL/min/{1.73_m2} — ABNORMAL LOW (ref >59)

## 2022-01-30 ENCOUNTER — Telehealth: Payer: PPO

## 2022-01-30 NOTE — Progress Notes (Deleted)
Chronic Care Management Pharmacy Note  01/30/2022 Name:  Kathleen Williamson MRN:  [id number] DOB:  [birth year]  Summary: CCM F/U visit -Medication adherence significantly improved with pill packs/delivery -Pt reports significant hand Williamson, small improvement with Tylenol 500 mg. She received call from [facility] but declined appt due to personal preference to avoid [facility] hospitals/clinics. She would prefer to go to [facility] in [place].  Recommendations: -Coordinate with PCP for new Rheumatology referral to [facility]: -[facility] will call patient monthly for pill pack coordination -Pharmacist follow up televisit scheduled for 6 months -PCP visit 09/19/22 (AWV)    Subjective: Kathleen Williamson is an 81 y.o. year old female who is a primary patient of Kathleen Bush, MD.  The CCM team was consulted for assistance with disease management and care coordination needs.    Engaged with patient by telephone or follow up visit in response to provider referral for pharmacy case management and/or care coordination services.   Consent to Services:  The patient was given information about Chronic Care Management services, agreed to services, and gave verbal consent prior to initiation of services.  Please see initial visit note for detailed documentation.   Patient Care Team: Kathleen Bush, MD as PCP - General (Family Medicine) Kathleen Pain, MD as PCP - Cardiology (Cardiology) Kathleen Cotta, MD as Referring Physician (Ophthalmology) Kathleen Williamson, [facility] as Pharmacist (Pharmacist)   Recent office visits: 01/17/22 Dr Danise Mina OV: f/u - stable. Off Entresto, on lisinopril. No changes. F/U 8 months for CPE.  07/05/21 phone note - c/o hand Williamson. Ordered Xrays. Referred to Rheumatology (fam hx RA) 06/18/21 Dr Darnell Level OV: DM f/u; A1c 7.1%; consider ARB if GFR remains impaired; stay off Farxiga. Repeat renal panel, Vit D, ESR 1 wk at [facility]. Start Vitamin D 1000 IU.  10/30/20 Dr Darnell Level OV: f/u UTI; change Klonopin to 0.5 mg 1 tablet only PRN. Sent to [facility]. 10/01/20- PCP - Presented for pre-op eval, Drop klonopin dose to 1/2 tablet in the morning and 1 tablet at night time - do this for at least a week and monitor effect on balance. If mood is doing well, may stop the morning dose altogether.  Recent consult visits: 12/04/21 NP Ambrose Pancoast (Cardiology): CAD; new LBBB - ECHO shows EF 30-35%, start Entresto 24/26 mg, repeat ECHO 2 months, BMP 1 week  10/31/21 NP Ambrose Pancoast (Cardiology): new LBBB - order ECHO.  Hospital visits: ED - 10/06/20 - Confusion, thought to be chronic process, follow up with neurology ED - 07/27/20 - Generalized weakness x 2 weeks, fall. BG 315, not on any medications for diabetes. Work up unremarkable. Follow up with PCP for diabetes management.  Objective:  Lab Results  Component Value Date   CREATININE 1.61 (H) 01/21/2022   BUN 16 01/21/2022   GFR 32.30 (L) 01/03/2022   GFRNONAA 47 (L) 11/16/2020   GFRAA 46 (L) 01/31/2014   NA 139 01/21/2022   K 4.5 01/21/2022   CALCIUM 9.5 01/21/2022   CO2 19 (L) 01/21/2022   GLUCOSE 140 (H) 01/21/2022    Lab Results  Component Value Date/Time   HGBA1C 7.1 (A) 06/18/2021 02:36 PM   HGBA1C 6.7 (H) 10/01/2020 12:11 PM   HGBA1C 7.6 (H) 06/18/2020 10:55 AM   GFR 32.30 (L) 01/03/2022 10:41 AM   GFR 31.32 (L) 12/17/2021 10:44 AM   MICROALBUR 5.7 (H) 06/18/2020 10:55 AM   MICROALBUR 0.7 02/18/2019 09:44 AM    Last diabetic Eye  exam:  Lab Results  Component Value Date/Time   HMDIABEYEEXA No Retinopathy 05/27/2021 12:00 AM   HMDIABEYEEXA No Retinopathy 05/27/2021 12:00 AM    Last diabetic Foot exam:  09/27/2018 normal   Lab Results  Component Value Date   CHOL 149 09/24/2021   HDL 45.90 09/24/2021   LDLCALC 73 09/24/2021   LDLDIRECT 79.0 09/17/2021   TRIG 148.0 09/24/2021   CHOLHDL 3 09/24/2021       Latest Ref Rng & Units 01/03/2022   10:41  AM 09/17/2021    3:32 PM 08/21/2021    3:13 PM  Hepatic Function  Total Protein 6.0 - 8.3 g/dL  6.9  6.6   Albumin 3.5 - 5.2 g/dL 3.7  4.1  4.2   AST 0 - 37 U/L  14    ALT 0 - 35 U/L  13    Alk Phosphatase 39 - 117 U/L  55    Total Bilirubin 0.2 - 1.2 mg/dL  0.2      Lab Results  Component Value Date/Time   TSH 0.83 08/21/2021 03:13 PM   TSH 1.378 10/06/2020 06:06 PM   TSH 1.10 10/01/2020 12:11 PM   FREET4 1.03 10/06/2020 06:06 PM   FREET4 0.93 10/01/2020 12:11 PM       Latest Ref Rng & Units 08/21/2021    3:13 PM 11/16/2020    3:19 AM 11/15/2020    3:23 AM  CBC  WBC 4.0 - 10.5 K/uL 9.1  9.9  9.8   Hemoglobin 12.0 - 15.0 g/dL 11.5  10.1  10.6   Hematocrit 36.0 - 46.0 % 36.0  32.6  33.8   Platelets 150.0 - 400.0 K/uL 332.0  204  253     Lab Results  Component Value Date/Time   VD25OH 44.38 01/03/2022 10:41 AM   VD25OH 27 06/10/2021 12:00 AM   VD25OH 29.07 (L) 06/18/2020 10:55 AM    Clinical ASCVD: Yes  The ASCVD Risk score (Arnett DK, et al., 2019) failed to calculate for the following reasons:   The 2019 ASCVD risk score is only valid for ages 62 to 76       09/17/2021    4:12 PM 08/21/2021    2:37 PM 10/30/2020    2:46 PM  Depression screen PHQ 2/9  Decreased Interest _0 Down, Depressed, Hopeless _1 PHQ - 2 Score _2 Altered sleeping 0 2 2  Tired, decreased energy _3 Change in appetite 0 2 2  Feeling bad or failure about yourself  0 0 1  Trouble concentrating 0 2 1  Moving slowly or fidgety/restless 0 0 0  Suicidal thoughts 0 0 0  PHQ-9 Score _4 Difficult doing work/chores Somewhat difficult Somewhat difficult Somewhat difficult       09/17/2021    4:12 PM 08/21/2021    2:38 PM 10/30/2020    2:46 PM 06/18/2020   11:18 AM  GAD 7 : Generalized Anxiety Score  Nervous, Anxious, on Edge _5 Control/stop worrying _6 Worry too much - different things 1 1 0 0  Trouble relaxing 1 1 0 1  Restless 0 0 0 0  Easily annoyed or irritable 0 0 0  0  Afraid - awful might happen 0 0 0 0  Total GAD 7 Score _7 Anxiety Difficulty Not difficult at all Somewhat difficult Not difficult at all  Social History   Tobacco Use  Smoking Status Never  Smokeless Tobacco Never   BP Readings from Last 3 Encounters:  01/17/22 (!) 136/58  12/04/21 126/68  10/31/21 128/76   Pulse Readings from Last 3 Encounters:  01/17/22 84  12/04/21 85  10/31/21 92   Wt Readings from Last 3 Encounters:  01/17/22 134 lb (60.8 kg)  12/04/21 133 lb 3.2 oz (60.4 kg)  10/31/21 132 lb 9.6 oz (60.1 kg)   BMI Readings from Last 3 Encounters:  01/17/22 23.74 kg/m  12/04/21 23.60 kg/m  10/31/21 23.49 kg/m    Assessment/Interventions: Review of patient past medical history, allergies, medications, health status, including review of consultants reports, laboratory and other test data, was performed as part of comprehensive evaluation and provision of chronic care management services.   SDOH:  (Social Determinants of Health) assessments and interventions performed: No SDOH Interventions    Flowsheet Row Office Visit from 08/21/2021 in [place] at [facility] Visit from 10/30/2020 in [place] at [place] Management from 10/23/2020 in [place] at [employer] Management from 07/31/2020 in [place] at [facility] Visit from 05/09/2020 in [place] at [facility] Visit from 05/13/2019 in [place] at [place]  SDOH Interventions        Depression Interventions/Treatment  Medication, Currently on Treatment Medication, Referral to Psychiatry, Counseling, Currently on Treatment -- -- Medication, Counseling, Currently on Treatment Medication, Counseling, Currently on Treatment  Financial Strain Interventions -- -- Intervention Not Indicated Intervention Not Indicated  [Meds affordable] -- --       SDOH Screenings   Food Insecurity: No Food  Insecurity (12/02/2019)  Housing: [place]  (12/02/2019)  Transportation Needs: No Transportation Needs (12/02/2019)  Depression (PHQ2-9): Low Risk  (09/17/2021)  Recent Concern: Depression (PHQ2-9) - Medium Risk (08/21/2021)  Financial Resource Strain: Low Risk  (10/26/2020)  Tobacco Use: Low Risk  (01/17/2022)    [place]  Allergies  Allergen Reactions   Sulfa Antibiotics Nausea Only   Crestor [Rosuvastatin Calcium] Other (See Comments)    Leg ache    Medications Reviewed Today     Reviewed by Kathleen Bush, MD (Physician) on 01/17/22 at 1015  Med List Status: <None>   Medication Order Taking? Sig Documenting Provider Last Dose Status Informant  Accu-Chek FastClix Lancets MISC [id number] Yes USE AS INSTRUCTED TO CHECK BLOOD SUGAR ONCE DAILY Kathleen Bush, MD Taking Active Family Member  acetaminophen (TYLENOL) 325 MG tablet [id number] Yes Take 650 mg by mouth every 6 (six) hours as needed for moderate Williamson or headache. [provider] Taking Active Family Member  amLODipine (Hoopa) 2.5 MG tablet [id number] Yes TAKE ONE TABLET BY MOUTH EVERY MORNING Kathleen Bush, MD Taking Active   aspirin EC 81 MG tablet [id number] Yes Take 81 mg by mouth daily. Swallow whole. [provider] Taking Active   Black Pepper-Turmeric (TURMERIC COMPLEX/BLACK PEPPER) 3-500 MG CAPS [id number] Yes Take 1 capsule by mouth daily. Kathleen Bush, MD Taking Active   Blood Glucose Monitoring Suppl (ACCU-CHEK GUIDE) w/Device KIT [id number] Yes 1 each by Does not apply route as directed. Use as instructed to check blood sugar once daily Kathleen Bush, MD Taking Active Family Member  Cholecalciferol (VITAMIN D3) 25 MCG (1000 UT) CAPS [id number] Yes Take 1 capsule (1,000 Units total) by mouth daily. Kathleen Bush, MD Taking Active   clonazePAM [facility]) 0.5 MG tablet [id number] Yes Take 1 tablet (0.5 mg total) by mouth daily  as needed for anxiety (sedation precautions). Kathleen Bush, MD Taking Active   denosumab [facility]) 60 MG/ML SOSY injection [id number] Yes Inject 60 mg into the skin every 6 (six) months. Kathleen Bush, MD Taking Active Family Member           Med Note Liana Gerold Sep 22, 2021  9:59 PM)    ezetimibe (ZETIA) 10 MG tablet [id number] Yes Take 1 tablet (10 mg total) by mouth daily. Leanor Kail, PA Taking Active   glucose blood (ONETOUCH VERIO) test strip [id number] Yes Use as instructed to check blood sugar once a day Kathleen Bush, MD Taking Active   levothyroxine (SYNTHROID) 50 MCG tablet [id number] Yes TAKE ONE TABLET BY MOUTH ONCE DAILY Kathleen Bush, MD Taking Active   lisinopril (ZESTRIL) 10 MG tablet [id number] Yes Take 1 tablet (10 mg total) by mouth daily. Marylu Lund., NP Taking Active   loperamide (IMODIUM A-D) 2 MG tablet [id number] Yes Take 2 mg by mouth 4 (four) times daily as needed for diarrhea or loose stools. [provider] Taking Active Family Member  mirtazapine (REMERON) 15 MG tablet [id number] Yes TAKE ONE TABLET BY MOUTH EVERYDAY AT BEDTIME Kathleen Bush, MD Taking Active   nitroGLYCERIN (NITROLINGUAL) 0.4 MG/SPRAY spray [id number] Yes Place 1 spray under the tongue every 5 (five) minutes as needed.  Patient taking differently: Place 1 spray under the tongue every 5 (five) minutes as needed for chest Williamson.   Kathleen Pain, MD Taking Active   pravastatin (PRAVACHOL) 80 MG tablet [id number] Yes TAKE ONE TABLET BY MOUTH EVERYDAY AT BEDTIME Kathleen Pain, MD Taking Active   sacubitril-valsartan [facility]) 24-26 MG [id number] Yes Take 1 tablet by mouth 2 (two) times daily. Marylu Lund., NP Taking Active   venlafaxine XR (EFFEXOR-XR) 75 MG 24 hr capsule [id number] Yes Take 3 capsules (225 mg total) by mouth every morning. Kathleen Bush, MD Taking Active   vitamin B-12 (CYANOCOBALAMIN) 1000 MCG tablet [id number] Yes Take 1 tablet (1,000 mcg total) by mouth daily. Kathleen Bush, MD  Taking Active             Patient Active Problem List   Diagnosis Date Noted   LBBB (left bundle branch block) 01/17/2022   Chronic HFrEF (heart failure with reduced ejection fraction) ([place]) 01/17/2022   Low serum vitamin B12 09/21/2021   Positive ANA (antinuclear antibody) 08/24/2021   Bilateral carpal tunnel syndrome 08/22/2021   Erosive osteoarthritis of both hands 06/18/2021   Memory impairment 01/16/2021   Hypertension 12/11/2020   Status post right hip replacement 90/21/1155   Complicated UTI (urinary tract infection) 10/31/2020   Pre-op evaluation 10/03/2020   Sensorineural hearing loss, bilateral 07/07/2020   Osteoarthritis of right hip 09/17/2019   Diarrhea 07/26/2019   Recurrent falls 09/15/2017   Tongue lesion 20/80/2233   Complicated grief 61/22/4497   Health maintenance examination 02/07/2016   Baker's cyst of knee 08/01/2015   Diabetes mellitus type 2, controlled, with complications ([place]) 53/00/5110   Subclavian steal syndrome    Medicare annual wellness visit, subsequent 01/31/2015   Advanced care planning/counseling discussion 01/31/2015   Carotid stenosis 01/31/2015   Vertebral artery stenosis/occlusion 01/31/2015   CKD stage 3 due to type 2 diabetes mellitus ([place]) 01/21/2015   Microscopic colitis 10/26/2014   Osteoporosis    Hypothyroidism    Dermatomyositis ([place])    MDD (major depressive disorder), recurrent severe, without psychosis ([place]) 01/12/2014   Atherosclerosis of native coronary artery of native heart  without angina pectoris 12/13/2013   Hyperlipidemia associated with type 2 diabetes mellitus ([place]) 12/13/2013   Pseudocyst of pancreas 08/29/2011    Immunization History  Administered Date(s) Administered   Fluad Quad(high Dose 65+) 12/28/2018   Influenza, High Dose Seasonal PF 12/18/2017, 12/23/2018, 01/23/2020   Influenza,inj,Quad PF,6+ Mos 01/22/2015, 02/04/2016, 12/19/2016   Influenza-Unspecified 04/01/2011   Moderna SARS-COV2 Booster  Vaccination 02/24/2020, 08/30/2020   Moderna Sars-Covid-2 Vaccination 04/29/2019, 05/27/2019   Pfizer Covid-19 Vaccine Bivalent Booster 105yr & up 01/03/2021   Pneumococcal Conjugate-13 02/04/2016   Pneumococcal-Unspecified 03/24/2007   Td 03/24/2007, 09/05/2016   Tdap 09/09/2017, 01/25/2018, 09/12/2019   Zoster Recombinat (Shingrix) 02/20/2017, 05/02/2017   Zoster, Live 04/14/2009    Conditions to be addressed/monitored:  Hypertension, Hyperlipidemia, Diabetes, Coronary Artery Disease, Chronic Kidney Disease, Depression, and Osteoporosis  There are no care plans that you recently modified to display for this patient.    Medication Assistance: None required.  Patient affirms current coverage meets needs.   Compliance/Adherence/Medication fill history: Care Gaps: Mammogram (due 06/23/20) Urine Microalbumin (due 06/18/21)  Star-Rating Drugs: Pravastatin - PDC 100%  Medication Access: Within the past 30 days, how often has patient missed a dose of medication? *** Is a pillbox or other method used to improve adherence? {YES/NO:21197} Factors that may affect medication adherence? {CHL DESC; BARRIERS:21522} Are meds synced by current pharmacy? {YES/NO:21197} Are meds delivered by current pharmacy? {YES/NO:21197} Does patient experience delays in picking up medications due to transportation concerns? {YES/NO:21197}  [facility] Reviewed: Is patient disadvantaged to use [facility]?: {YES/NO:21197} Current Rx insurance plan: *** Name and location of Current pharmacy:  [facility] [place] [id number]: [phone]: [phone] [id number] [address]: [phone]: [phone] services reviewed with patient today?: {YES/NO:21197} Packs (30-day): delivered  01/16/22 Co Q10 100 mg- take 1 tablet bedtime Venlafaxine ER 75 mg - take 3 tablets  breakfast Mirtazapine 15 mg - take 1 tablet bedtime Pravastatin 80 mg - take 1 tablet bedtime Levothyroxine 50 mcg - take 1 tablet breakfast Aspirin 81 mg - take 1 tablet breakfast Ezetimibe 10 mg - take 1 tablet breakfast Amlodipine 2.5 mg - take 1 tablet by mouth daily breakfast Lisinopril 1062mtake 1 tablet breakfast  Care Plan and Follow Up Patient Decision:  Patient agrees to Care Plan and Follow-up.  Follow Up Plan: Telephone follow up appointment with care management team member scheduled for: 6 months  LinCharlene BrookeharmD, BCACP Clinical Pharmacist [phone] Current Barriers:  Unable to independently monitor therapeutic efficacy Suboptimal therapeutic regimen for BP/CKD  Pharmacist Clinical Goal(s):  Patient will achieve adherence to monitoring guidelines and medication adherence to achieve therapeutic efficacy adhere to plan to optimize therapeutic regimen for HTN/CKD as evidenced by report of adherence to recommended medication management changes through collaboration with PharmD and provider.   Interventions:  1:1 collaboration with GutRia BushD regarding development and update of comprehensive plan of care as evidenced by provider attestation and co-signature  Inter-disciplinary care team collaboration (see longitudinal plan of care)  Comprehensive medication review performed; medication list updated in electronic medical record  Hypertension / Heart failure (BP goal <130/80) -{US controlled/uncontrolled:25276} -Last ejection fraction: 30-35% (Date: 11/2021) -HF type: Systolic -Current home BP readings: *** -Current treatment: Amlodipine 2.5 mg daily - Appropriate,  Effective, Safe, Accessible Lisinopril 10 mg daily -Medications previously tried: metoprolol, Entresto (GI) -Educated on BP goals and benefits of medications  for prevention of heart attack, stroke and kidney damage; Importance of home blood pressure monitoring; -Counseled to monitor BP at pharmacy/grocery periodically -Recommended to continue current medication  Hyperlipidemia/CAD: (LDL goal < 70) -Controlled - LDL 73 (09/2021) adequate; pt is compliant with medications in pill pack -Hx CAD - CABGx5 2003 -Current treatment: Pravastatin 80 mg daily at bedtime - Appropriate, Effective, Safe, Accessible Ezetimibe 10 mg daily at breakfast  -Appropriate, Effective, Safe, Accessible OTC QoQ10 100 mg daily-  Appropriate, Effective, Safe, Accessible OTC Aspirin EC 81 mg daily - Appropriate, Effective, Safe, Accessible -Medications previously tried: Crestor, niacin  -Recommended to continue current medication  Diabetes (A1c goal <8%) -Controlled - A1c 7.1% (06/2021), worsened from 6.7 (09/2020) when patient was taking Rybelsus 3 mg; pt has declined to take [place] due to fear of UTI side effects, we have discussed this previously and she would prefer to not take [place] -Current BG readings: not available during visit today -Current medications: None -Medications previously tried: Geneticist, molecular (cost), Rybelsus, avoided metformin due to GFR; Farxiga - fear of yeast infxn -Discussed A1c goal < 8% is reasonable given age and comorbidities, will continue to hold off on medication  Depression/Anxiety (Goal: Control symptoms) -Controlled, patient has been doing a lot better lately -Current treatment: Mirtazapine 15 mg daily HS - Appropriate, Effective, Safe, Accessible Venlafaxine XR 150 mg daily AM - Appropriate, Effective, Safe, Accessible Clonazepam 0.5 mg only PRN - Appropriate, Effective, Safe, Accessible -Medications previously tried/failed: Citalopram -Recommend to continue current medication  Osteoporosis (Goal prevent fractures) -Controlled -Last DEXA Scan: 06/24/19   T-Score femoral neck: -2.7  T-Score total hip: -2.4  T-Score lumbar spine:  -2.1 -Patient is a candidate for pharmacologic treatment due to T-Score < -2.5 in femoral neck -Current treatment  Prolia q6 months (started 09/02/17, last dose 12/27/21) - Appropriate, Effective, Safe, Accessible -Medications previously tried: none  -Recommend to continue current medication; next Prolia dose due 12/2021  Hand Williamson (Goal: mange symptoms) -Uncontrolled - pt has been referred to [facility] for this, however she would prefer to go to [facility] -Current treatment  Tylenol 500 mg  Voltaren gel - ineffective Tramadol 50 mg - old Rx -Medications previously tried: n/a  -Coordinate with PCP for new referral to preferred location  Health Maintenance -Vaccine gaps: PPSV or PCV20 -Medication adherence: significantly improved with pill packs  Patient Goals/Self-Care Activities Patient will:  - take medications as prescribed as evidenced by patient report and record review focus on medication adherence by pill packs check glucose 3x weekly, document, and provide at future appointments check blood pressure periodically at pharmacy/grocery, document, and provide at future appointments

## 2022-01-31 ENCOUNTER — Telehealth: Payer: Self-pay | Admitting: Pharmacist

## 2022-01-31 ENCOUNTER — Telehealth: Payer: PPO

## 2022-01-31 DIAGNOSIS — N1832 Chronic kidney disease, stage 3b: Secondary | ICD-10-CM | POA: Diagnosis not present

## 2022-01-31 DIAGNOSIS — I2510 Atherosclerotic heart disease of native coronary artery without angina pectoris: Secondary | ICD-10-CM | POA: Diagnosis not present

## 2022-01-31 DIAGNOSIS — I739 Peripheral vascular disease, unspecified: Secondary | ICD-10-CM | POA: Diagnosis not present

## 2022-01-31 DIAGNOSIS — Z951 Presence of aortocoronary bypass graft: Secondary | ICD-10-CM | POA: Diagnosis not present

## 2022-01-31 DIAGNOSIS — I129 Hypertensive chronic kidney disease with stage 1 through stage 4 chronic kidney disease, or unspecified chronic kidney disease: Secondary | ICD-10-CM | POA: Diagnosis not present

## 2022-01-31 NOTE — Telephone Encounter (Signed)
  Chronic Care Management   Outreach Note  01/31/2022 Name: Kathleen Williamson MRN: [id number] DOB: 1940/07/04  Referred by: [name], MD  Patient had a phone appointment scheduled with clinical pharmacist today.  An unsuccessful telephone outreach was attempted today. The patient was referred to the pharmacist for assistance with medications, care management and care coordination.   Patient will NOT be penalized in any way for missing a CCM appointment. The no-show fee does not apply.  If possible, a message was left to return call to: [phone] or to [facility].  Charlene Brooke, PharmD, BCACP Clinical Pharmacist [facility] at [facility] [phone]

## 2022-02-02 ENCOUNTER — Other Ambulatory Visit: Payer: Self-pay | Admitting: Family Medicine

## 2022-02-02 DIAGNOSIS — F332 Major depressive disorder, recurrent severe without psychotic features: Secondary | ICD-10-CM

## 2022-02-03 ENCOUNTER — Ambulatory Visit (HOSPITAL_COMMUNITY): Payer: PPO | Attending: Nurse Practitioner

## 2022-02-03 DIAGNOSIS — I6523 Occlusion and stenosis of bilateral carotid arteries: Secondary | ICD-10-CM | POA: Diagnosis not present

## 2022-02-03 DIAGNOSIS — I2510 Atherosclerotic heart disease of native coronary artery without angina pectoris: Secondary | ICD-10-CM

## 2022-02-03 LAB — ECHOCARDIOGRAM COMPLETE
Area-P 1/2: 2.7 cm2
S' Lateral: 4.3 cm

## 2022-02-04 ENCOUNTER — Other Ambulatory Visit: Payer: Self-pay | Admitting: Family Medicine

## 2022-02-04 DIAGNOSIS — I10 Essential (primary) hypertension: Secondary | ICD-10-CM

## 2022-02-05 ENCOUNTER — Telehealth: Payer: Self-pay

## 2022-02-05 NOTE — Chronic Care Management (AMB) (Addendum)
Chronic Care Management Pharmacy Assistant   Name: Kathleen Williamson  MRN: [id number] DOB: 1940/09/07    Reason for Encounter: Medication Adherence and Delivery Coordination    Recent office visits:  01/17/22-Javier Gutierrez,MD(PCP)-f/u -Diarrhea related to entresto - now cardiology has transitioned to plain lisinopril, no medication changes, try wrist braces at night for bilat hand pain , A1c next visit. F/u 8 months   Recent consult visits:  None since last CCM contact  Hospital visits:  None in previous 6 months  Medications: Outpatient Encounter Medications as of 02/05/2022  Medication Sig   Accu-Chek FastClix Lancets MISC USE AS INSTRUCTED TO CHECK BLOOD SUGAR ONCE DAILY   acetaminophen (TYLENOL) 325 MG tablet Take 650 mg by mouth every 6 (six) hours as needed for moderate pain or headache.   amLODipine (NORVASC) 2.5 MG tablet TAKE ONE TABLET BY MOUTH EVERY MORNING   aspirin EC 81 MG tablet Take 81 mg by mouth daily. Swallow whole.   Black Pepper-Turmeric (TURMERIC COMPLEX/BLACK PEPPER) 3-500 MG CAPS Take 1 capsule by mouth daily.   Blood Glucose Monitoring Suppl (ACCU-CHEK GUIDE) w/Device KIT 1 each by Does not apply route as directed. Use as instructed to check blood sugar once daily   Cholecalciferol (VITAMIN D3) 25 MCG (1000 UT) CAPS Take 2 capsules (2,000 Units total) by mouth daily.   clonazePAM (KLONOPIN) 0.5 MG tablet Take 1 tablet (0.5 mg total) by mouth daily as needed for anxiety (sedation precautions).   denosumab (PROLIA) 60 MG/ML SOSY injection Inject 60 mg into the skin every 6 (six) months.   ezetimibe (ZETIA) 10 MG tablet Take 1 tablet (10 mg total) by mouth daily.   glucose blood (ONETOUCH VERIO) test strip Use as instructed to check blood sugar once a day   levothyroxine (SYNTHROID) 50 MCG tablet TAKE ONE TABLET BY MOUTH ONCE DAILY   lisinopril (ZESTRIL) 10 MG tablet Take 1 tablet (10 mg total) by mouth daily.   loperamide (IMODIUM A-D) 2 MG tablet Take 2 mg  by mouth 4 (four) times daily as needed for diarrhea or loose stools.   mirtazapine (REMERON) 15 MG tablet TAKE ONE TABLET BY MOUTH EVERYDAY AT BEDTIME   nitroGLYCERIN (NITROLINGUAL) 0.4 MG/SPRAY spray Place 1 spray under the tongue every 5 (five) minutes as needed. (Patient taking differently: Place 1 spray under the tongue every 5 (five) minutes as needed for chest pain.)   pravastatin (PRAVACHOL) 80 MG tablet TAKE ONE TABLET BY MOUTH EVERYDAY AT BEDTIME   venlafaxine XR (EFFEXOR-XR) 75 MG 24 hr capsule TAKE THREE CAPSULES BY MOUTH EVERY MORNING   vitamin B-12 (CYANOCOBALAMIN) 1000 MCG tablet Take 1 tablet (1,000 mcg total) by mouth daily.   No facility-administered encounter medications on file as of 02/05/2022.   BP Readings from Last 3 Encounters:  01/17/22 (!) 136/58  12/04/21 126/68  10/31/21 128/76    Lab Results  Component Value Date   HGBA1C 7.1 (A) 06/18/2021      Recent OV, Consult or Hospital visit:  01/17/22  PCP f/u- no changes  No medication changes indicated   Last adherence delivery date:01/16/22      Patient is due for next adherence delivery on: 02/17/22  Spoke with patient on 02/05/22 reviewed medications and coordinated delivery.  This delivery to include: Adherence Packaging  30 Days  Packs: Co Q10 100 mg- take 1 tablet bedtime Venlafaxine ER 75 mg - take 3 tablets  breakfast Mirtazapine 15 mg - take 1 tablet bedtime Pravastatin 80 mg -  take 1 tablet bedtime Levothyroxine 50 mcg - take 1 tablet breakfast Aspirin 81 mg - take 1 tablet breakfast Ezetimibe 10 mg - take 1 tablet breakfast Amlodipine 2.5 mg - take 1 tablet by mouth daily breakfast Lisinopril 10mg  -take 1 tablet breakfast  VIAL medications: none   Patient declined the following medications this month: none  Any concerns about your medications? Yes  How often do you forget or accidentally miss a dose? Never  Do you use a pillbox? No  Is patient in packaging Yes  What is the date on  your next pill pack?02/05/22  Any concerns or issues with your packaging?satisfied   Refills requested from providers include:amlodipine  Confirmed delivery date of 02/17/22, advised patient that pharmacy will contact them the morning of delivery.  Recent blood pressure readings are as follows:none available    Recent blood glucose readings are as follows:none available   Annual wellness visit in last year? Yes Most Recent BP reading:136/58    Cycle dispensing form sent to Margaretmary Dys, PTM for review.   Charlene Brooke, CPP notified  Avel Sensor, [place]  [phone]

## 2022-02-06 NOTE — Progress Notes (Deleted)
Office Visit Note  Patient: Kathleen Williamson             Date of Birth: 1940-04-30           MRN: [id number]             PCP: [name], MD Referring: [name], MD Visit Date: 02/19/2022 Occupation: _0 @  Subjective:  No chief complaint on file.   History of Present Illness: Kathleen Williamson is a 81 y.o. female   Lab work from 05/01/2009: ESR 3, CK 45, TSH 1.651, complements within normal limits, ANA negative, aldolase 4.2, anti-CCP 2.7, double-stranded DNA negative, Ro negative, La negative, Smith negative, RNP negative, SCL 70 negative. Myositis panel negative on 05/01/09.   Activities of Daily Living:  Patient reports morning stiffness for *** {minute/hour:19697}.   Patient {ACTIONS;DENIES/REPORTS:21021675::"Denies"} nocturnal pain.  Difficulty dressing/grooming: {ACTIONS;DENIES/REPORTS:21021675::"Denies"} Difficulty climbing stairs: {ACTIONS;DENIES/REPORTS:21021675::"Denies"} Difficulty getting out of chair: {ACTIONS;DENIES/REPORTS:21021675::"Denies"} Difficulty using hands for taps, buttons, cutlery, and/or writing: {ACTIONS;DENIES/REPORTS:21021675::"Denies"}  No Rheumatology ROS completed.   PMFS History:  Patient Active Problem List   Diagnosis Date Noted   LBBB (left bundle branch block) 01/17/2022   Chronic HFrEF (heart failure with reduced ejection fraction) ([place]) 01/17/2022   Low serum vitamin B12 09/21/2021   Positive ANA (antinuclear antibody) 08/24/2021   Bilateral carpal tunnel syndrome 08/22/2021   Erosive osteoarthritis of both hands 06/18/2021   Memory impairment 01/16/2021   Hypertension 12/11/2020   Status post right hip replacement 13/11/6576   Complicated UTI (urinary tract infection) 10/31/2020   Pre-op evaluation 10/03/2020   Sensorineural hearing loss, bilateral 07/07/2020   Osteoarthritis of right hip 09/17/2019   Diarrhea 07/26/2019   Recurrent falls 09/15/2017   Tongue lesion 46/96/2952   Complicated grief 84/13/2440    Health maintenance examination 02/07/2016   Baker's cyst of knee 08/01/2015   Diabetes mellitus type 2, controlled, with complications ([place]) 02/08/2535   Subclavian steal syndrome    Medicare annual wellness visit, subsequent 01/31/2015   Advanced care planning/counseling discussion 01/31/2015   Carotid stenosis 01/31/2015   Vertebral artery stenosis/occlusion 01/31/2015   CKD stage 3 due to type 2 diabetes mellitus ([place]) 01/21/2015   Microscopic colitis 10/26/2014   Osteoporosis    Hypothyroidism    Dermatomyositis ([place])    MDD (major depressive disorder), recurrent severe, without psychosis ([place]) 01/12/2014   Atherosclerosis of native coronary artery of native heart without angina pectoris 12/13/2013   Hyperlipidemia associated with type 2 diabetes mellitus ([place]) 12/13/2013   Pseudocyst of pancreas 08/29/2011    Past Medical History:  Diagnosis Date   Arthritis    fingers   CAD (coronary artery disease) 2003   MI s/p 5v CABG   Carotid stenosis    [place]   CKD (chronic kidney disease) stage 3, GFR 30-59 ml/min (HCC)    Dermatomyositis ([place])    saw Dr Estanislado Pandy, improved on its own   Diabetes mellitus without complication ([place])    Forehead laceration 09/15/2017   History of chicken pox    History of measles    History of migraine none since 2003   HLD (hyperlipidemia)    Hypothyroidism    MDD (major depressive disorder), recurrent episode, moderate ([place])    h/o SI after lost husband unexpectedly 2015   Microscopic colitis 05/2014   by colonoscopy, lymphocytic, zoloft related   Myocardial infarction ([place]) 2003   Osteoporosis    DEXA 03/2013 T -3.1, DEXA 10//2016 -2.9 hip, -2.2 spine   Pancreatic cyst    Dr  Murinson, no f/u needed   Prediabetes 2014   Subclavian steal syndrome    Urine incontinence     Family History  Problem Relation Age of Onset   CAD Father 101   CAD Mother 74   Diabetes Mother    Rheum arthritis Mother    Cancer Maternal Aunt        breast    CAD Other        strong paternal side   Stroke Neg Hx    Past Surgical History:  Procedure Laterality Date   APPENDECTOMY  1997   with hysterectomy   BACK SURGERY  1981   lower   BREAST LUMPECTOMY Left 1977   benign   COLONOSCOPY WITH PROPOFOL N/A 05/23/2014   microscopic colitis - Garlan Fair, MD   CORONARY ARTERY BYPASS GRAFT  2003   x 5 v   ESOPHAGOGASTRODUODENOSCOPY (EGD) WITH PROPOFOL N/A 05/23/2014   WNL Garlan Fair, MD   ROTATOR CUFF REPAIR Right [phone]   TOOTH EXTRACTION  yrs ago   TOTAL ABDOMINAL HYSTERECTOMY W/ BILATERAL SALPINGOOPHORECTOMY  1997   endometriosis   TOTAL HIP ARTHROPLASTY Right 11/14/2020   Procedure: TOTAL HIP ARTHROPLASTY ANTERIOR APPROACH;  Surgeon: Gaynelle Arabian, MD;  Location: [facility];  Service: Orthopedics;  Laterality: Right;   Social History   Social History Narrative   Lives alone at [place].   Husband of 67 yrs died suddenly 10-17-13.   No children, no siblings.    Close friends Bethena Roys and Ace Gins nearby, cousin in [place].   Activity: going to [employer] History  Administered Date(s) Administered   Fluad Quad(high Dose 65+) 12/28/2018   Influenza, High Dose Seasonal PF 12/18/2017, 12/23/2018, 01/23/2020   Influenza,inj,Quad PF,6+ Mos 01/22/2015, 02/04/2016, 12/19/2016   Influenza-Unspecified 04/01/2011   Moderna SARS-COV2 Booster Vaccination 02/24/2020, 08/30/2020   Moderna Sars-Covid-2 Vaccination 04/29/2019, 05/27/2019   Pfizer Covid-19 Vaccine Bivalent Booster 83yrs & up 01/03/2021   Pneumococcal Conjugate-13 02/04/2016   Pneumococcal-Unspecified 03/24/2007   Td 03/24/2007, 09/05/2016   Tdap 09/09/2017, 01/25/2018, 09/12/2019   Zoster Recombinat (Shingrix) 02/20/2017, 05/02/2017   Zoster, Live 04/14/2009     Objective: Vital Signs: LMP  (LMP Unknown)    Physical Exam   Musculoskeletal Exam: ***  CDAI Exam: CDAI Score: -- Patient Global: --; Provider Global: -- Swollen: --; Tender: -- Joint Exam 02/19/2022    No joint exam has been documented for this visit   There is currently no information documented on the homunculus. Go to the Rheumatology activity and complete the homunculus joint exam.  Investigation: No additional findings.  Imaging: ECHOCARDIOGRAM COMPLETE  Result Date: 02/03/2022    ECHOCARDIOGRAM REPORT   Patient Name:   KENYANA HUSAK Date of Exam: 02/03/2022 Medical Rec #:  [id number]      Height:       63.0 in Accession #:    [id number]     Weight:       134.0 lb Date of Birth:  03/28/1941      BSA:          1.631 m Patient Age:    3 years       BP:           136/58 mmHg Patient Gender: F              HR:           70 bpm. Exam Location:  [facility] Procedure: 2D Echo, 3D Echo, Cardiac Doppler and  Color Doppler Indications:    I65.23 Carotid stenosis  History:        Patient has prior history of Echocardiogram examinations, most                 recent 11/27/2021. CAD and Previous Myocardial Infarction,                 Arrythmias:LBBB; Risk Factors:Hypertension, Diabetes and                 Dyslipidemia. Chronic heart failure with reduced ejection                 fraction. Chronic kidney disease. Hypothyroidism.  Sonographer:    Diamond Nickel RCS Referring Phys: Cherie Dark DICK IMPRESSIONS  1. Left ventricular ejection fraction, by estimation, is 30 to 35%. The left ventricle has moderately decreased function. The left ventricle demonstrates global hypokinesis. Abnormal (paradoxical) septal motion, consistent with left bundle branch block     Left ventricular diastolic parameters are consistent with Grade I diastolic dysfunction (impaired relaxation).  2. Right ventricular systolic function is mildly reduced. The right ventricular size is normal. Tricuspid regurgitation signal is inadequate for assessing PA pressure.  3. The mitral valve is normal in structure. Trivial mitral valve regurgitation. No evidence of mitral stenosis.  4. The aortic valve is tricuspid. Aortic valve regurgitation is  not visualized. No aortic stenosis is present. FINDINGS  Left Ventricle: Left ventricular ejection fraction, by estimation, is 30 to 35%. The left ventricle has moderately decreased function. The left ventricle demonstrates global hypokinesis. 3D left ventricular ejection fraction analysis performed but not reported based on interpreter judgement due to suboptimal tracking. The left ventricular internal cavity size was normal in size. There is no left ventricular hypertrophy. Abnormal (paradoxical) septal motion, consistent with left bundle branch block. Left ventricular diastolic parameters are consistent with Grade I diastolic dysfunction (impaired relaxation). Right Ventricle: The right ventricular size is normal. Right vetricular wall thickness was not well visualized. Right ventricular systolic function is mildly reduced. Tricuspid regurgitation signal is inadequate for assessing PA pressure. Left Atrium: Left atrial size was normal in size. Right Atrium: Right atrial size was normal in size. Pericardium: There is no evidence of pericardial effusion. Mitral Valve: The mitral valve is normal in structure. Trivial mitral valve regurgitation. No evidence of mitral valve stenosis. Tricuspid Valve: The tricuspid valve is normal in structure. Tricuspid valve regurgitation is trivial. Aortic Valve: The aortic valve is tricuspid. Aortic valve regurgitation is not visualized. No aortic stenosis is present. Pulmonic Valve: The pulmonic valve was not well visualized. Pulmonic valve regurgitation is trivial. Aorta: The aortic root and ascending aorta are structurally normal, with no evidence of dilitation. IAS/Shunts: The interatrial septum was not well visualized.  LEFT VENTRICLE PLAX 2D LVIDd:         5.20 cm   Diastology LVIDs:         4.30 cm   LV e' medial:    6.53 cm/s LV PW:         0.80 cm   LV E/e' medial:  8.4 LV IVS:        0.80 cm   LV e' lateral:   9.79 cm/s LVOT diam:     1.80 cm   LV E/e' lateral: 5.6 LV SV:          35 LV SV Index:   22 LVOT Area:     2.54 cm  3D Volume EF:                          3D EF:        25 %                          LV EDV:       137 ml                          LV ESV:       102 ml                          LV SV:        35 ml RIGHT VENTRICLE RV Basal diam:  2.70 cm RV S prime:     7.07 cm/s TAPSE (M-mode): 1.3 cm LEFT ATRIUM             Index        RIGHT ATRIUM           Index LA diam:        3.60 cm 2.21 cm/m   RA Area:     11.70 cm LA Vol (A2C):   32.0 ml 19.62 ml/m  RA Volume:   27.90 ml  17.11 ml/m LA Vol (A4C):   25.9 ml 15.88 ml/m LA Biplane Vol: 30.1 ml 18.45 ml/m  AORTIC VALVE LVOT Vmax:   75.00 cm/s LVOT Vmean:  47.400 cm/s LVOT VTI:    0.139 m  AORTA Ao Root diam: 2.70 cm Ao Asc diam:  2.70 cm MITRAL VALVE MV Area (PHT): 2.70 cm     SHUNTS MV Decel Time: 281 msec     Systemic VTI:  0.14 m MV E velocity: 55.00 cm/s   Systemic Diam: 1.80 cm MV A velocity: 106.00 cm/s MV E/A ratio:  0.52 Oswaldo Milian MD Electronically signed by Oswaldo Milian MD Signature Date/Time: 02/03/2022/1:44:46 PM    Final     Recent Labs: Lab Results  Component Value Date   WBC 9.1 08/21/2021   HGB 11.5 (L) 08/21/2021   PLT 332.0 08/21/2021   NA 139 01/21/2022   K 4.5 01/21/2022   CL 106 01/21/2022   CO2 19 (L) 01/21/2022   GLUCOSE 140 (H) 01/21/2022   BUN 16 01/21/2022   CREATININE 1.61 (H) 01/21/2022   BILITOT 0.2 09/17/2021   ALKPHOS 55 09/17/2021   AST 14 09/17/2021   ALT 13 09/17/2021   PROT 6.9 09/17/2021   ALBUMIN 3.7 01/03/2022   CALCIUM 9.5 01/21/2022   GFRAA 46 (L) 01/31/2014    Speciality Comments: No specialty comments available.  Procedures:  No procedures performed Allergies: Sulfa antibiotics and Crestor [rosuvastatin calcium]   Assessment / Plan:     Visit Diagnoses: Erosive osteoarthritis of both hands  Dermatomyositis ([place]) - no rheum records.  CK 32 on 09/17/21.   Positive ANA (antinuclear antibody) - 08/21/21: ANA  1:1280NS. SPEP did not reveal any abnormal protein bands. 09/17/21: CK 32   Bilateral carpal tunnel syndrome  Age-related osteoporosis without current pathological fracture - Prolia every 6 months  Status post right hip replacement  Synovial cyst of left knee  Controlled type 2 diabetes mellitus with complication, without long-term current use of insulin (HCC)  LBBB (left bundle branch block)  History of depression  Occlusion and stenosis of left vertebral artery  Primary hypertension  Chronic HFrEF (heart failure with reduced ejection fraction) (HCC)  Atherosclerosis of native coronary artery of native heart without angina pectoris  Subclavian steal syndrome  History of hypothyroidism  CKD stage 3 due to type 2 diabetes mellitus ([place])  Hyperlipidemia associated with type 2 diabetes mellitus ([place])  Sensorineural hearing loss, bilateral  Orders: No orders of the defined types were placed in this encounter.  No orders of the defined types were placed in this encounter.   Face-to-face time spent with patient was *** minutes. Greater than 50% of time was spent in counseling and coordination of care.  Follow-Up Instructions: No follow-ups on file.   Ofilia Neas, PA-C  Note - This record has been created using Dragon software.  Chart creation errors have been sought, but may not always  have been located. Such creation errors do not reflect on  the standard of medical care.

## 2022-02-12 ENCOUNTER — Ambulatory Visit: Payer: PPO | Attending: Cardiology | Admitting: Cardiology

## 2022-02-12 ENCOUNTER — Encounter: Payer: Self-pay | Admitting: Cardiology

## 2022-02-12 VITALS — BP 140/50 | HR 67 | Ht 63.0 in | Wt 135.0 lb

## 2022-02-12 DIAGNOSIS — I447 Left bundle-branch block, unspecified: Secondary | ICD-10-CM

## 2022-02-12 DIAGNOSIS — I5022 Chronic systolic (congestive) heart failure: Secondary | ICD-10-CM

## 2022-02-12 NOTE — Progress Notes (Signed)
Cardiology Office Note:    Date:  02/12/2022   ID:  Kathleen Williamson, DOB Jul 12, 1940, MRN [id number]  PCP:  Kathleen Bush, MD   [facility] Providers Cardiologist:  Kathleen Furbish, MD     Referring MD: Kathleen Bush, MD    History of Present Illness:    Kathleen Williamson is a 81 y.o. female with PMHx of CAD, HTN, HLD, CHF, vertebral artery occlusion, LBBB, DM, hypothyroidism, and CKD presenting to clinic today for follow up for coronary artery disease post CABG. 2003 (CABG-LIMA-LAD, free right IMA to PDA,SVG-diagonal, SVG-OM 2-OM 5 03/2002). Former patient of Kathleen Williamson. Has a Baker's cyst on left knee. This has limited her walking. She done Weight [employer].  Her husband died in 10-25-13. She has struggled in the past with her grief. She is without any family or children. Lives at [facility] on [address] and enjoys it there. Has a cat named Saki.  At her last visit with me on 08/31/20 she was doing well from a CV perspective. The visit was for a preoperative cardiac evaluation prior to right THA. She has since had right THA with Dr. Wynelle Williamson on 11/14/20.  Today, she states that she has been doing well overall. She reports occasional episodes of chest discomfort when she is walking too quickly. She is not particularly bothered by this and notes that it resolves when she slows down. She denies any shortness of breath.  The patient denies dyspnea at rest or with exertion, PND, orthopnea, or leg swelling. Denies cough, fever, chills, nausea, or vomiting. Denies syncope or presyncope. Denies dizziness or lightheadedness.   She states that she has recovered very well following her hip replacement. She is very happy with her results and has been active without hip pain.   Past Medical History:  Diagnosis Date   Arthritis    fingers   CAD (coronary artery disease) 2003   MI s/p 5v CABG   Carotid stenosis    Kathleen Williamson   CKD (chronic kidney disease) stage 3, GFR 30-59 ml/min (HCC)     [place]    saw Dr Kathleen Williamson, improved on its own   Diabetes mellitus without complication ([place])    Forehead laceration 09/15/2017   History of chicken pox    History of measles    History of migraine none since 2003   HLD (hyperlipidemia)    Hypothyroidism    Kathleen Williamson (major depressive disorder), recurrent episode, moderate ([place])    h/o SI after lost husband unexpectedly 2015   Microscopic colitis 05/2014   by colonoscopy, lymphocytic, zoloft related   Myocardial infarction ([place]) 2003   Osteoporosis    DEXA 03/2013 T -3.1, DEXA 10//2016 -2.9 hip, -2.2 spine   Pancreatic cyst    Dr Kathleen Williamson, no f/u needed   Prediabetes 2014   Subclavian steal syndrome    Urine incontinence     Past Surgical History:  Procedure Laterality Date   APPENDECTOMY  1997   with hysterectomy   [place]   lower   BREAST LUMPECTOMY Left 1977   benign   COLONOSCOPY WITH PROPOFOL N/A 05/23/2014   microscopic colitis - Kathleen Fair, MD   CORONARY ARTERY BYPASS GRAFT  2003   x 5 v   ESOPHAGOGASTRODUODENOSCOPY (EGD) WITH PROPOFOL N/A 05/23/2014   WNL Kathleen Fair, MD   ROTATOR CUFF REPAIR Right [phone]   TOOTH EXTRACTION  yrs ago   [place]  endometriosis   TOTAL HIP ARTHROPLASTY Right 11/14/2020   Procedure: TOTAL HIP ARTHROPLASTY ANTERIOR APPROACH;  Surgeon: Kathleen Arabian, MD;  Location: [facility];  Service: Orthopedics;  Laterality: Right;    Current Medications: Current Meds  Medication Sig   Accu-Chek FastClix Lancets MISC USE AS INSTRUCTED TO CHECK BLOOD SUGAR ONCE DAILY   acetaminophen (TYLENOL) 325 MG tablet Take 650 mg by mouth every 6 (six) hours as needed for moderate pain or headache.   amLODipine (NORVASC) 2.5 MG tablet TAKE ONE TABLET BY MOUTH EVERY MORNING   aspirin EC 81 MG tablet Take 81 mg by mouth daily. Swallow whole.   Blood Glucose Monitoring Suppl (ACCU-CHEK GUIDE) w/Device KIT 1 each by Does not  apply route as directed. Use as instructed to check blood sugar once daily   Cholecalciferol (VITAMIN D3) 25 MCG (1000 UT) CAPS Take 2 capsules (2,000 Units total) by mouth daily.   clonazePAM (KLONOPIN) 0.5 MG tablet Take 1 tablet (0.5 mg total) by mouth daily as needed for anxiety (sedation precautions).   denosumab (PROLIA) 60 MG/ML SOSY injection Inject 60 mg into the skin every 6 (six) months.   ezetimibe (ZETIA) 10 MG tablet Take 1 tablet (10 mg total) by mouth daily.   glucose blood (ONETOUCH VERIO) test strip Use as instructed to check blood sugar once a day   levothyroxine (SYNTHROID) 50 MCG tablet TAKE ONE TABLET BY MOUTH ONCE DAILY   lisinopril (ZESTRIL) 10 MG tablet Take 1 tablet (10 mg total) by mouth daily.   loperamide (IMODIUM A-D) 2 MG tablet Take 2 mg by mouth 4 (four) times daily as needed for diarrhea or loose stools.   mirtazapine (REMERON) 15 MG tablet TAKE ONE TABLET BY MOUTH EVERYDAY AT BEDTIME   nitroGLYCERIN (NITROLINGUAL) 0.4 MG/SPRAY spray Place 1 spray under the tongue every 5 (five) minutes as needed.   pravastatin (PRAVACHOL) 80 MG tablet TAKE ONE TABLET BY MOUTH EVERYDAY AT BEDTIME   venlafaxine XR (EFFEXOR-XR) 75 MG 24 hr capsule TAKE THREE CAPSULES BY MOUTH EVERY MORNING   vitamin B-12 (CYANOCOBALAMIN) 1000 MCG tablet Take 1 tablet (1,000 mcg total) by mouth daily.     Allergies:   Sulfa antibiotics and Crestor [rosuvastatin calcium]   Social History   Socioeconomic History   Marital status: Widowed    Spouse name: Not on file   Number of children: Not on file   Years of education: Not on file   Highest education level: Not on file  Occupational History   Not on file  Tobacco Use   Smoking status: Never   Smokeless tobacco: Never  Vaping Use   Vaping Use: Never used  Substance and Sexual Activity   Alcohol use: No   Drug use: No   Sexual activity: Not Currently  Other Topics Concern   Not on file  Social History Narrative   Lives alone at [facility].   Husband of 34 yrs died suddenly 09-27-13.   No children, no siblings.    Close friends Bethena Roys and Ace Gins nearby, cousin in [place].   Activity: going to [employer]   Financial Resource Strain: Low Risk  (10/26/2020)   Overall Financial Resource Strain (CARDIA)    Difficulty of Paying Living Expenses: Not very hard  Food Insecurity: No Food Insecurity (12/02/2019)   Hunger Vital Sign    Worried About Running Out of Food in the Last Year: Never true    Ran Out of Food in the  Last Year: Never true  Transportation Needs: No Transportation Needs (12/02/2019)   PRAPARE - Hydrologist (Medical): No    Lack of Transportation (Non-Medical): No  Physical Activity: Not on file  Stress: Not on file  Social Connections: Not on file     Family History: The patient's family history includes CAD in an other family member; CAD (age of onset: 29) in her father; CAD (age of onset: 85) in her mother; Cancer in her maternal aunt; Diabetes in her mother; Rheum arthritis in her mother. There is no history of Stroke.  ROS:   Please see the history of present illness.  (+) Rare episodes of chest discomfort All other systems reviewed and are negative.  EKGs/Labs/Other Studies Reviewed:    The following studies were reviewed today:  Echo 02/03/22: 1. Left ventricular ejection fraction, by estimation, is 30 to 35%. The  left ventricle has moderately decreased function. The left ventricle  demonstrates global hypokinesis. Abnormal (paradoxical) septal motion,  consistent with left bundle branch block      Left ventricular diastolic parameters are consistent with Grade I  diastolic dysfunction (impaired relaxation).   2. Right ventricular systolic function is mildly reduced. The right  ventricular size is normal. Tricuspid regurgitation signal is inadequate  for assessing PA pressure.   3. The mitral valve is normal in structure. Trivial mitral  valve  regurgitation. No evidence of mitral stenosis.   4. The aortic valve is tricuspid. Aortic valve regurgitation is not  visualized. No aortic stenosis is present.   Echo 11/27/21: 1. Left ventricular ejection fraction, by estimation, is 30 to 35%. The  left ventricle has moderately decreased function. The left ventricle  demonstrates global hypokinesis with septal-lateral dyssynchrony  consistent with LBBB. Left ventricular  diastolic parameters are consistent with Grade I diastolic dysfunction  (impaired relaxation).   2. Right ventricular systolic function is mildly reduced. The right  ventricular size is normal. Tricuspid regurgitation signal is inadequate  for assessing PA pressure.   3. The mitral valve is normal in structure. Trivial mitral valve  regurgitation. No evidence of mitral stenosis.   4. The aortic valve is tricuspid. Aortic valve regurgitation is not  visualized. No aortic stenosis is present.   5. The inferior vena cava is normal in size with greater than 50%  respiratory variability, suggesting right atrial pressure of 3 mmHg.   US Carotid Bilateral 06/25/20:  Color duplex indicates minimal heterogeneous and calcified plaque, with no hemodynamically significant stenosis by duplex criteria in the extracranial cerebrovascular circulation. Waveform of the left vertebral artery indicates proximal vertebral artery stenosis.  VAS US Carotid Duplex Bilateral 03/02/18:   Right Carotid: Velocities in the right ICA are consistent with a 1-39%  Stenosis. Non-hemodynamically significant plaque <50% noted in the CCA. The ECA appears <50% stenosed. Triphasic brachial waveform.  Left Carotid: Velocities in the left ICA are consistent with a 1-39%  stenosis. Non-hemodynamically significant plaque noted in the CCA. The  ECA appears <50% stenosed. Triphasic brachial waveform.  Vertebrals:  Right vertebral artery demonstrates antegrade flow. Left vertebral artery demonstrates  retrograde flow.  Subclavians: Right subclavian artery was stenotic. Normal flow hemodynamics were seen in the left subclavian artery.    EKG: EKG personally reviewed. 02/12/22: EKG was not ordered. 10/31/2021: LBBB  5/20/220- sinus rythym, rate 97, non-specific T-wave changes. No change from prior EKG   Recent Labs: 08/21/2021: Hemoglobin 11.5; Platelets 332.0; TSH 0.83 09/17/2021: ALT 13 01/21/2022: BUN 16; Creatinine, Ser  1.61; Potassium 4.5; Sodium 139  Recent Lipid Panel    Component Value Date/Time   CHOL 149 09/24/2021 0934   CHOL 206 06/20/2014 0000   TRIG 148.0 09/24/2021 0934   TRIG 155 06/20/2014 0000   HDL 45.90 09/24/2021 0934   HDL 39 06/20/2014 0000   CHOLHDL 3 09/24/2021 0934   VLDL 29.6 09/24/2021 0934   LDLCALC 73 09/24/2021 0934   LDLCALC 135 06/20/2014 0000   LDLDIRECT 79.0 09/17/2021 1532     Risk Assessment/Calculations:      Physical Exam:    VS:  BP (!) 140/50 (BP Location: Left Arm, Patient Position: Sitting, Cuff Size: Normal)   Pulse 67   Ht _0  (1.6 m)   Wt 135 lb (61.2 kg)   LMP  (LMP Unknown)   SpO2 98%   BMI 23.91 kg/m     Wt Readings from Last 3 Encounters:  02/12/22 135 lb (61.2 kg)  01/17/22 134 lb (60.8 kg)  12/04/21 133 lb 3.2 oz (60.4 kg)     GEN: Well nourished, well developed in no acute distress HEENT: Normal NECK: No JVD; No carotid bruits LYMPHATICS: No lymphadenopathy CARDIAC: RRR, no murmurs, rubs, gallops, no carotid bruit RESPIRATORY:  Clear to auscultation without rales, wheezing or rhonchi  ABDOMEN: Soft, non-tender, non-distended MUSCULOSKELETAL:  No edema; No deformity  SKIN: Warm and dry NEUROLOGIC:  Alert and oriented x 3 PSYCHIATRIC:  Normal affect   ASSESSMENT:    1. Left bundle branch block   2. Chronic HFrEF (heart failure with reduced ejection fraction) (HCC)     PLAN:    In order of problems listed above:  I had seen her prior to her hip replacement with Dr. Wynelle Williamson.  Doing well.  Left  bundle branch block Cardiomyopathy Jaquelyn Bitter saw her, echocardiogram noted to be 30 to 35% EF.  Delene Loll was tried but she developed side effect of diarrhea.  She was placed back on her lisinopril 10 mg.  She is doing well with this.  She is NYHA class I.  No signs of hypervolemia.  I am comfortable continuing with current drug regimen.  No ICD.   Coronary artery disease - Post CABG 2003.  Overall doing fairly well.  No anginal symptoms.  Her EKG at baseline does show T wave inversions in the precordial leads.  These are prolonged changes for her.  She had a nuclear stress test in 2022 that showed no ischemia.   Frequent falls - Disequilibrium culprit.  Continue with physical therapy.  She has a rolling walker with her.  She usually falls backwards.  She is on osteoporosis medication.  Has not had any frequent falls currently.   Hyperlipidemia - She is currently on pravastatin 80 mg Zetia.  She is statin intolerance to Lipitor Zocor.  LDL 77 hemoglobin 10 creatinine 1.1 potassium 4.2 ALT 13 TSH 0.34 from outside labs.   Chronic kidney disease stage IIIa - Last creatinine 1.1.  Improved from 1.47.  Avoid excessive NSAID use.  Staying away from [place].   Carotid disease - Mild plaque noted on ultrasound in 2017.  Continue with secondary risk factor prevention which includes medication management with pravastatin and Zetia as well as good overall blood pressure control.  Continue with aspirin.  Left bundle branch block - New finding on ECG.  No need for pacemaker.   Follow up: 6 months with Jaquelyn Bitter   Medication Adjustments/Labs and Tests Ordered: Current medicines are reviewed at length with the patient today.  Concerns  regarding medicines are outlined above.  No orders of the defined types were placed in this encounter.  No orders of the defined types were placed in this encounter.   Patient Instructions  Medication Instructions:  The current medical regimen is effective;  continue  present plan and medications.  *If you need a refill on your cardiac medications before your next appointment, please call your pharmacy*  Follow-Up: At [facility], you and your health needs are our priority.  As part of our continuing mission to provide you with exceptional heart care, we have created designated Provider Care Teams.  These Care Teams include your primary Cardiologist (physician) and Advanced Practice Providers (APPs -  Physician Assistants and Nurse Practitioners) who all work together to provide you with the care you need, when you need it.  We recommend signing up for the patient portal called "MyChart".  Sign up information is provided on this After Visit Summary.  MyChart is used to connect with patients for Virtual Visits (Telemedicine).  Patients are able to view lab/test results, encounter notes, upcoming appointments, etc.  Non-urgent messages can be sent to your provider as well.   To learn more about what you can do with MyChart, go to [email or web address].    Your next appointment:   6 month(s)  The format for your next appointment:   In Person  Provider:   Ambrose Pancoast, NP          Important Information About Sugar           I,Alexis Herring,acting as a scribe for Kathleen Furbish, MD.,have documented all relevant documentation on the behalf of Kathleen Furbish, MD,as directed by  Kathleen Furbish, MD while in the presence of Kathleen Furbish, MD.  I, Kathleen Furbish, MD, have reviewed all documentation for this visit. The documentation on 02/12/22 for the exam, diagnosis, procedures, and orders are all accurate and complete.   Signed, Kathleen Furbish, MD  02/12/2022 1:58 PM    [facility]

## 2022-02-12 NOTE — Patient Instructions (Signed)
Medication Instructions:  The current medical regimen is effective;  continue present plan and medications.  *If you need a refill on your cardiac medications before your next appointment, please call your pharmacy*  Follow-Up: At [facility], you and your health needs are our priority.  As part of our continuing mission to provide you with exceptional heart care, we have created designated Provider Care Teams.  These Care Teams include your primary Cardiologist (physician) and Advanced Practice Providers (APPs -  Physician Assistants and Nurse Practitioners) who all work together to provide you with the care you need, when you need it.  We recommend signing up for the patient portal called "MyChart".  Sign up information is provided on this After Visit Summary.  MyChart is used to connect with patients for Virtual Visits (Telemedicine).  Patients are able to view lab/test results, encounter notes, upcoming appointments, etc.  Non-urgent messages can be sent to your provider as well.   To learn more about what you can do with MyChart, go to [email or web address].    Your next appointment:   6 month(s)  The format for your next appointment:   In Person  Provider:   Ambrose Pancoast, NP          Important Information About Sugar

## 2022-02-19 ENCOUNTER — Ambulatory Visit: Payer: PPO | Admitting: Rheumatology

## 2022-02-19 DIAGNOSIS — Z8639 Personal history of other endocrine, nutritional and metabolic disease: Secondary | ICD-10-CM

## 2022-02-19 DIAGNOSIS — I6502 Occlusion and stenosis of left vertebral artery: Secondary | ICD-10-CM

## 2022-02-19 DIAGNOSIS — E118 Type 2 diabetes mellitus with unspecified complications: Secondary | ICD-10-CM

## 2022-02-19 DIAGNOSIS — I447 Left bundle-branch block, unspecified: Secondary | ICD-10-CM

## 2022-02-19 DIAGNOSIS — M810 Age-related osteoporosis without current pathological fracture: Secondary | ICD-10-CM

## 2022-02-19 DIAGNOSIS — M3500 Sicca syndrome, unspecified: Secondary | ICD-10-CM

## 2022-02-19 DIAGNOSIS — M3390 Dermatopolymyositis, unspecified, organ involvement unspecified: Secondary | ICD-10-CM

## 2022-02-19 DIAGNOSIS — I5022 Chronic systolic (congestive) heart failure: Secondary | ICD-10-CM

## 2022-02-19 DIAGNOSIS — G5603 Carpal tunnel syndrome, bilateral upper limbs: Secondary | ICD-10-CM

## 2022-02-19 DIAGNOSIS — Z8659 Personal history of other mental and behavioral disorders: Secondary | ICD-10-CM

## 2022-02-19 DIAGNOSIS — Z96641 Presence of right artificial hip joint: Secondary | ICD-10-CM

## 2022-02-19 DIAGNOSIS — H903 Sensorineural hearing loss, bilateral: Secondary | ICD-10-CM

## 2022-02-19 DIAGNOSIS — M154 Erosive (osteo)arthritis: Secondary | ICD-10-CM

## 2022-02-19 DIAGNOSIS — G458 Other transient cerebral ischemic attacks and related syndromes: Secondary | ICD-10-CM

## 2022-02-19 DIAGNOSIS — R768 Other specified abnormal immunological findings in serum: Secondary | ICD-10-CM

## 2022-02-19 DIAGNOSIS — I10 Essential (primary) hypertension: Secondary | ICD-10-CM

## 2022-02-19 DIAGNOSIS — I2510 Atherosclerotic heart disease of native coronary artery without angina pectoris: Secondary | ICD-10-CM

## 2022-02-19 DIAGNOSIS — E1169 Type 2 diabetes mellitus with other specified complication: Secondary | ICD-10-CM

## 2022-02-19 DIAGNOSIS — E1122 Type 2 diabetes mellitus with diabetic chronic kidney disease: Secondary | ICD-10-CM

## 2022-02-19 DIAGNOSIS — M7122 Synovial cyst of popliteal space [Baker], left knee: Secondary | ICD-10-CM

## 2022-03-04 ENCOUNTER — Telehealth: Payer: Self-pay

## 2022-03-04 NOTE — Chronic Care Management (AMB) (Signed)
Chronic Care Management Pharmacy Assistant   Name: Kathleen Williamson  MRN: [id number] DOB: Mar 30, 1941   Reason for Encounter: Medication Adherence and Delivery Coordination     Recent office visits:  None since last CCM contact   Recent consult visits:  02/12/22-[facility] MD(cardio)-f/u CAD,EKG, no medication changes f/u 6 mons.  Hospital visits:  None in previous 6 months  Medications: Outpatient Encounter Medications as of 03/04/2022  Medication Sig   Accu-Chek FastClix Lancets MISC USE AS INSTRUCTED TO CHECK BLOOD SUGAR ONCE DAILY   acetaminophen (TYLENOL) 325 MG tablet Take 650 mg by mouth every 6 (six) hours as needed for moderate pain or headache.   amLODipine (NORVASC) 2.5 MG tablet TAKE ONE TABLET BY MOUTH EVERY MORNING   aspirin EC 81 MG tablet Take 81 mg by mouth daily. Swallow whole.   Blood Glucose Monitoring Suppl (ACCU-CHEK GUIDE) w/Device KIT 1 each by Does not apply route as directed. Use as instructed to check blood sugar once daily   Cholecalciferol (VITAMIN D3) 25 MCG (1000 UT) CAPS Take 2 capsules (2,000 Units total) by mouth daily.   clonazePAM (KLONOPIN) 0.5 MG tablet Take 1 tablet (0.5 mg total) by mouth daily as needed for anxiety (sedation precautions).   denosumab (PROLIA) 60 MG/ML SOSY injection Inject 60 mg into the skin every 6 (six) months.   ezetimibe (ZETIA) 10 MG tablet Take 1 tablet (10 mg total) by mouth daily.   glucose blood (ONETOUCH VERIO) test strip Use as instructed to check blood sugar once a day   levothyroxine (SYNTHROID) 50 MCG tablet TAKE ONE TABLET BY MOUTH ONCE DAILY   lisinopril (ZESTRIL) 10 MG tablet Take 1 tablet (10 mg total) by mouth daily.   loperamide (IMODIUM A-D) 2 MG tablet Take 2 mg by mouth 4 (four) times daily as needed for diarrhea or loose stools.   mirtazapine (REMERON) 15 MG tablet TAKE ONE TABLET BY MOUTH EVERYDAY AT BEDTIME   nitroGLYCERIN (NITROLINGUAL) 0.4 MG/SPRAY spray Place 1 spray under the tongue every 5  (five) minutes as needed.   pravastatin (PRAVACHOL) 80 MG tablet TAKE ONE TABLET BY MOUTH EVERYDAY AT BEDTIME   venlafaxine XR (EFFEXOR-XR) 75 MG 24 hr capsule TAKE THREE CAPSULES BY MOUTH EVERY MORNING   vitamin B-12 (CYANOCOBALAMIN) 1000 MCG tablet Take 1 tablet (1,000 mcg total) by mouth daily.   No facility-administered encounter medications on file as of 03/04/2022.    BP Readings from Last 3 Encounters:  02/12/22 (!) 140/50  01/17/22 (!) 136/58  12/04/21 126/68    Pulse Readings from Last 3 Encounters:  02/12/22 67  01/17/22 84  12/04/21 85    Lab Results  Component Value Date/Time   HGBA1C 7.1 (A) 06/18/2021 02:36 PM   HGBA1C 6.7 (H) 10/01/2020 12:11 PM   HGBA1C 7.6 (H) 06/18/2020 10:55 AM   Lab Results  Component Value Date   CREATININE 1.61 (H) 01/21/2022   BUN 16 01/21/2022   GFR 32.30 (L) 01/03/2022   GFRNONAA 47 (L) 11/16/2020   GFRAA 46 (L) 01/31/2014   NA 139 01/21/2022   K 4.5 01/21/2022   CALCIUM 9.5 01/21/2022   CO2 19 (L) 01/21/2022     Last adherence delivery date:02/17/22      Patient is due for next adherence delivery on: 03/18/22  Spoke with patient on 03/04/22 reviewed medications and coordinated delivery.  This delivery to include: Adherence Packaging  30 Days  Co Q10 100 mg- take 1 tablet bedtime Venlafaxine ER 75 mg -  take 3 tablets  breakfast Mirtazapine 15 mg - take 1 tablet bedtime Pravastatin 80 mg - take 1 tablet bedtime Levothyroxine 50 mcg - take 1 tablet breakfast Aspirin 81 mg - take 1 tablet breakfast Ezetimibe 10 mg - take 1 tablet breakfast Amlodipine 2.5 mg - take 1 tablet by mouth daily breakfast Lisinopril 57m -take 1 tablet breakfast    Patient declined the following medications this month: none  Any concerns about your medications? No  How often do you forget or accidentally miss a dose? Never  Do you use a pillbox? No  Is patient in packaging Yes  What is the date on your next pill pack?03/03/22  Any  concerns or issues with your packaging? satisfied   No refill request needed.  Confirmed delivery date of 03/18/22, advised patient that pharmacy will contact them the morning of delivery.  Recent blood pressure readings are as follows:none available   Recent blood glucose readings are as follows:none available   Annual wellness visit in last year? Yes Most Recent BP reading:140/50  02/12/22  Cycle dispensing form sent to LMargaretmary Dys PTM for review. Next CCM appointment: none   LCharlene Brooke CPP notified  [place] [phone]

## 2022-03-05 ENCOUNTER — Ambulatory Visit: Payer: PPO | Admitting: Rheumatology

## 2022-04-02 ENCOUNTER — Telehealth: Payer: Self-pay

## 2022-04-02 NOTE — Chronic Care Management (AMB) (Signed)
Chronic Care Management Pharmacy Assistant   Name: Kathleen Williamson  MRN: [id number] DOB: [birth year]   Reason for Encounter: Medication Adherence and Delivery Coordination    Recent office visits:  None since last CCM contact  Recent consult visits:  None since last CCM contact  Hospital visits:  None in previous 6 months  Medications: Outpatient Encounter Medications as of 04/02/2022  Medication Sig   Accu-Chek FastClix Lancets MISC USE AS INSTRUCTED TO CHECK BLOOD SUGAR ONCE DAILY   acetaminophen (TYLENOL) 325 MG tablet Take 650 mg by mouth every 6 (six) hours as needed for moderate pain or headache.   amLODipine (NORVASC) 2.5 MG tablet TAKE ONE TABLET BY MOUTH EVERY MORNING   aspirin EC 81 MG tablet Take 81 mg by mouth daily. Swallow whole.   Blood Glucose Monitoring Suppl (ACCU-CHEK GUIDE) w/Device KIT 1 each by Does not apply route as directed. Use as instructed to check blood sugar once daily   Cholecalciferol (VITAMIN D3) 25 MCG (1000 UT) CAPS Take 2 capsules (2,000 Units total) by mouth daily.   clonazePAM (KLONOPIN) 0.5 MG tablet Take 1 tablet (0.5 mg total) by mouth daily as needed for anxiety (sedation precautions).   denosumab (PROLIA) 60 MG/ML SOSY injection Inject 60 mg into the skin every 6 (six) months.   ezetimibe (ZETIA) 10 MG tablet Take 1 tablet (10 mg total) by mouth daily.   glucose blood (ONETOUCH VERIO) test strip Use as instructed to check blood sugar once a day   levothyroxine (SYNTHROID) 50 MCG tablet TAKE ONE TABLET BY MOUTH ONCE DAILY   lisinopril (ZESTRIL) 10 MG tablet Take 1 tablet (10 mg total) by mouth daily.   loperamide (IMODIUM A-D) 2 MG tablet Take 2 mg by mouth 4 (four) times daily as needed for diarrhea or loose stools.   mirtazapine (REMERON) 15 MG tablet TAKE ONE TABLET BY MOUTH EVERYDAY AT BEDTIME   nitroGLYCERIN (NITROLINGUAL) 0.4 MG/SPRAY spray Place 1 spray under the tongue every 5 (five) minutes as needed.   pravastatin (PRAVACHOL) 80  MG tablet TAKE ONE TABLET BY MOUTH EVERYDAY AT BEDTIME   venlafaxine XR (EFFEXOR-XR) 75 MG 24 hr capsule TAKE THREE CAPSULES BY MOUTH EVERY MORNING   vitamin B-12 (CYANOCOBALAMIN) 1000 MCG tablet Take 1 tablet (1,000 mcg total) by mouth daily.   No facility-administered encounter medications on file as of 04/02/2022.    BP Readings from Last 3 Encounters:  02/12/22 (!) 140/50  01/17/22 (!) 136/58  12/04/21 126/68    Pulse Readings from Last 3 Encounters:  02/12/22 67  01/17/22 84  12/04/21 85    Lab Results  Component Value Date/Time   HGBA1C 7.1 (A) 06/18/2021 02:36 PM   HGBA1C 6.7 (H) 10/01/2020 12:11 PM   HGBA1C 7.6 (H) 06/18/2020 10:55 AM   Lab Results  Component Value Date   CREATININE 1.61 (H) 01/21/2022   BUN 16 01/21/2022   GFR 32.30 (L) 01/03/2022   GFRNONAA 47 (L) 11/16/2020   GFRAA 46 (L) 01/31/2014   NA 139 01/21/2022   K 4.5 01/21/2022   CALCIUM 9.5 01/21/2022   CO2 19 (L) 01/21/2022     Last adherence delivery date:03/18/22      Patient is due for next adherence delivery on: 04/16/21  Spoke with patient on 04/02/22 reviewed medications and coordinated delivery.  This delivery to include: Adherence Packaging  30 Days  Co Q10 100 mg- take 1 tablet bedtime Venlafaxine ER 75 mg - take 3 tablets  breakfast Mirtazapine 15  mg - take 1 tablet bedtime Pravastatin 80 mg - take 1 tablet bedtime Levothyroxine 50 mcg - take 1 tablet breakfast Aspirin 81 mg - take 1 tablet breakfast Ezetimibe 10 mg - take 1 tablet breakfast Amlodipine 2.5 mg - take 1 tablet by mouth daily breakfast Lisinopril 52m -take 1 tablet breakfast    Patient declined the following medications this month: none   Any concerns about your medications? No  How often do you forget or accidentally miss a dose? Never  Do you use a pillbox? No  Is patient in packaging Yes  What is the date on your next pill pack?04/01/22  Any concerns or issues with your packaging?satisfied   No  refill request needed.  Confirmed delivery date of 04/16/2022, advised patient that pharmacy will contact them the morning of delivery.  Recent blood pressure readings are as follows:none available, patient tried to get reading while on my call, error 2 times with monitor   Recent blood glucose readings are as follows:none available    Annual wellness visit in last year? Yes Most Recent BP reading:140/50  11/1 cardio office    Cycle dispensing form sent to [facility] for review. Next CCM appointment: none  Kathleen Williamson CPP notified  VAvel Sensor CLea [phone]

## 2022-04-07 IMAGING — CT CT CERVICAL SPINE W/O CM
3 of 4 series · 12 of 33 positions shown, 14 images · non-contrast
Comparison: CT head 09/09/2017, cervical spine radiograph
02/18/2012

CLINICAL DATA: Fall

EXAM:
CT HEAD WITHOUT CONTRAST
CT CERVICAL SPINE WITHOUT CONTRAST
TECHNIQUE: Multidetector CT imaging of the head and cervical spine was
performed following the standard protocol without intravenous
contrast. Multiplanar CT image reconstructions of the cervical spine
were also generated.

[Series 6: sagittal bone · sagittal · 0.24mm/px · 5 of 69 slices shown, 6 images]
[im 23/69  bone]
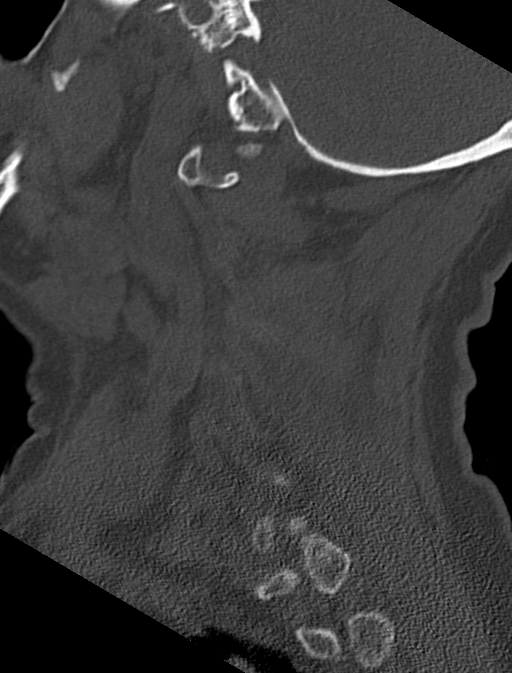
[im 29/69  bone]
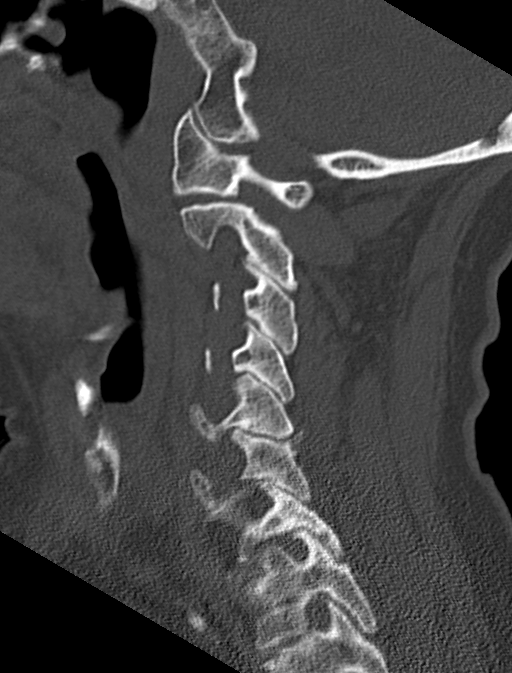
[im 35/69  soft-tissue]
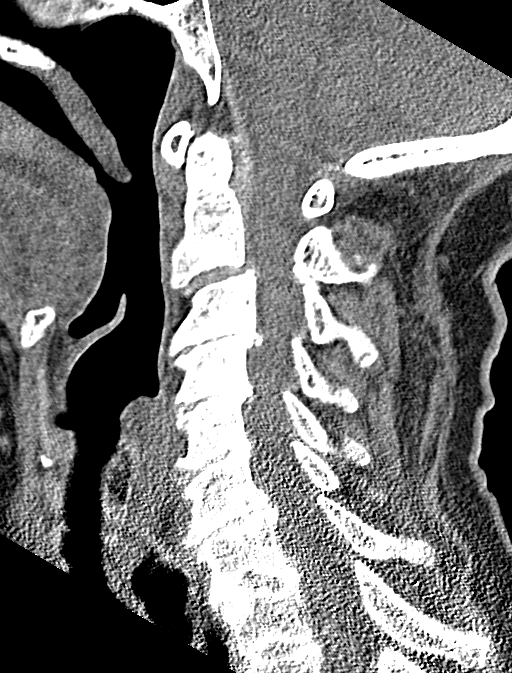
[im 35/69  bone]
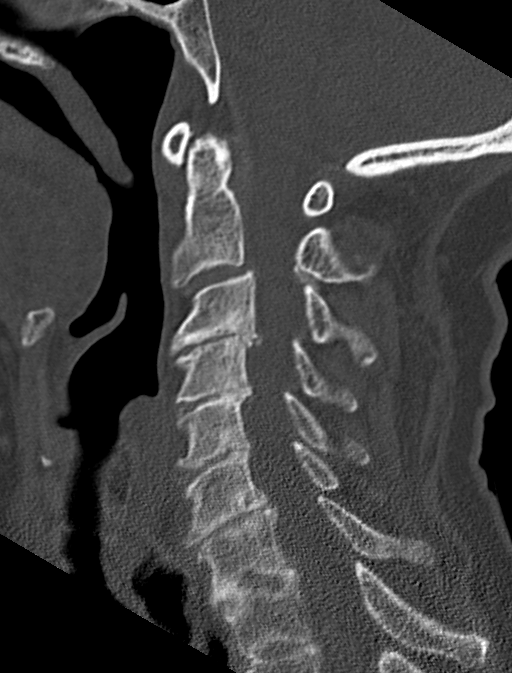
[im 40/69  bone]
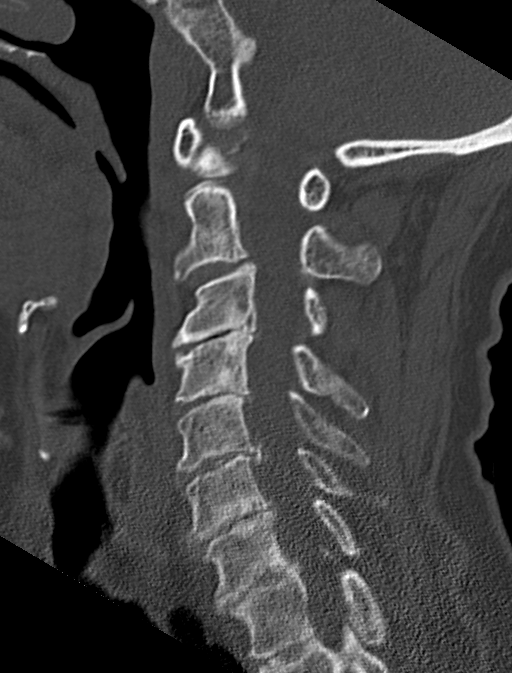
[im 46/69  bone]
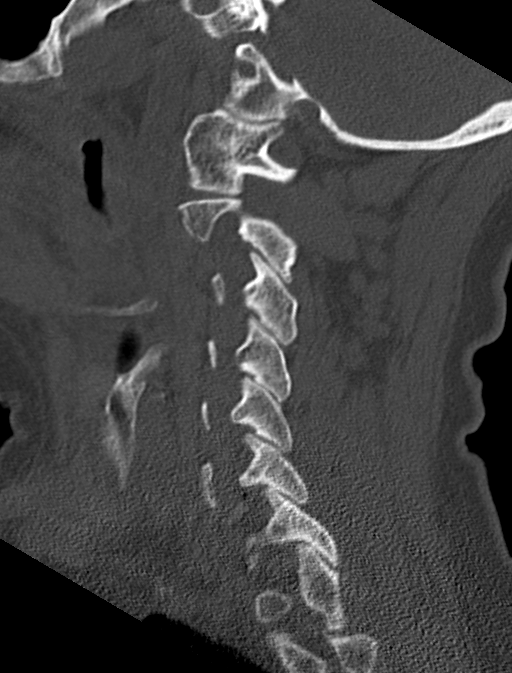

[Series 7: coronal bone · coronal · 0.22mm/px · 3 of 57 slices shown]
[im 14/57  bone]
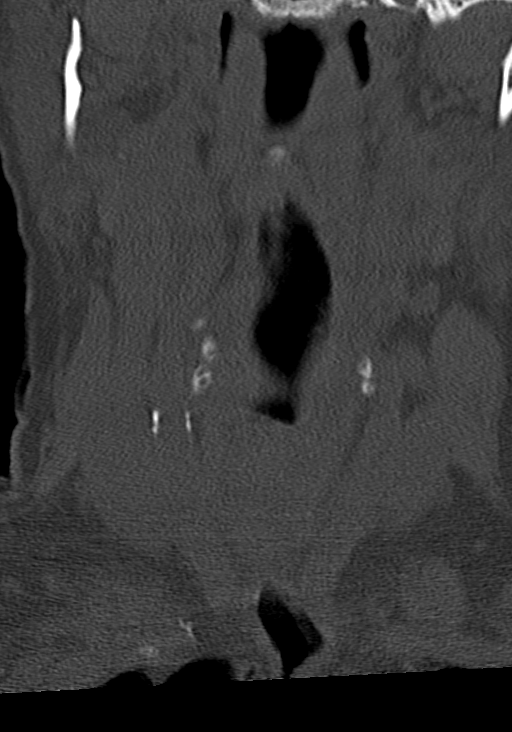
[im 24/57  bone]
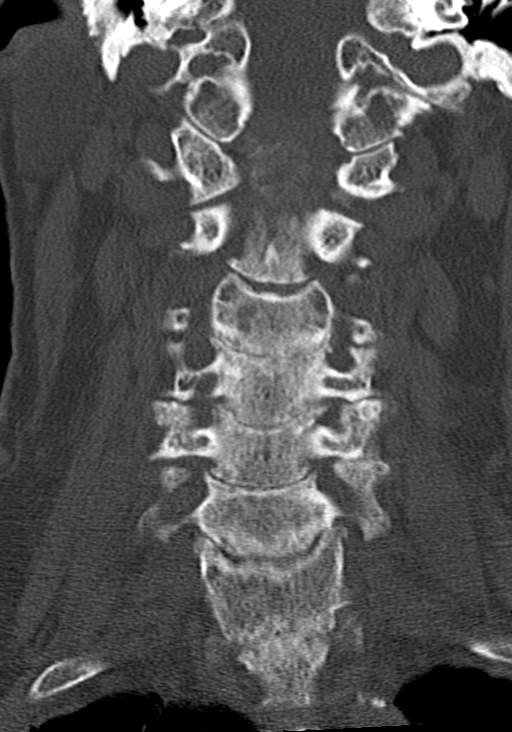
[im 33/57  bone]
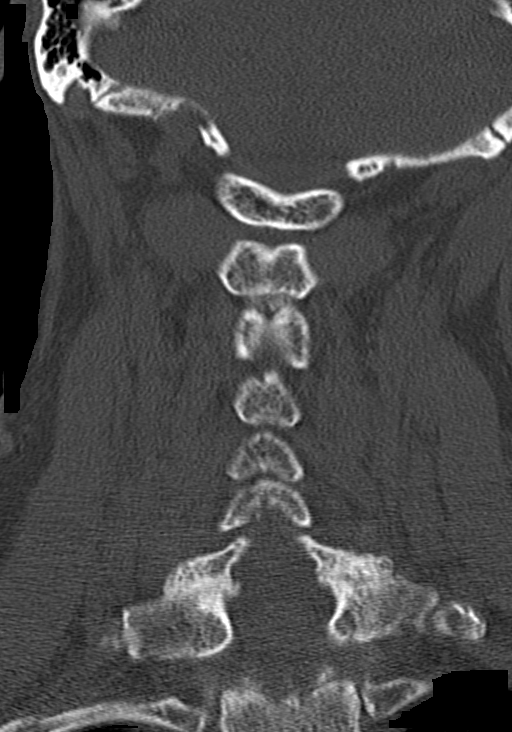

[Series 8: orthogonal bone · axial · 0.22mm/px · z∈[-250,-156]mm · 4 of 82 slices shown, 5 images]
[im 14/82  soft-tissue]
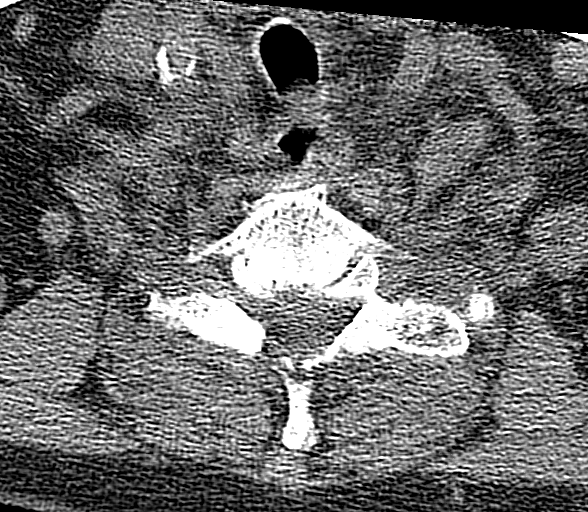
[im 14/82  bone]
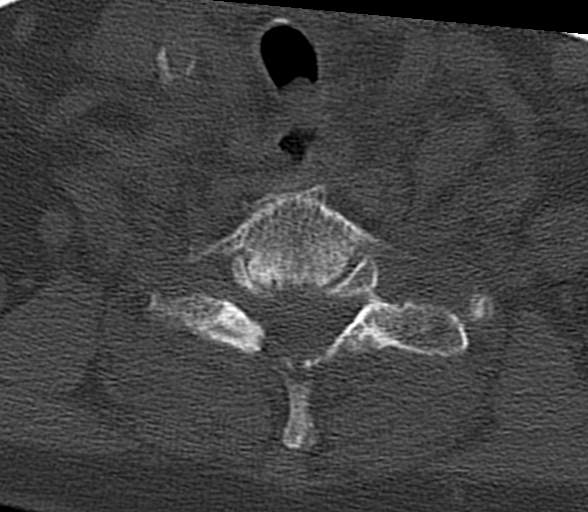
[im 28/82  bone]
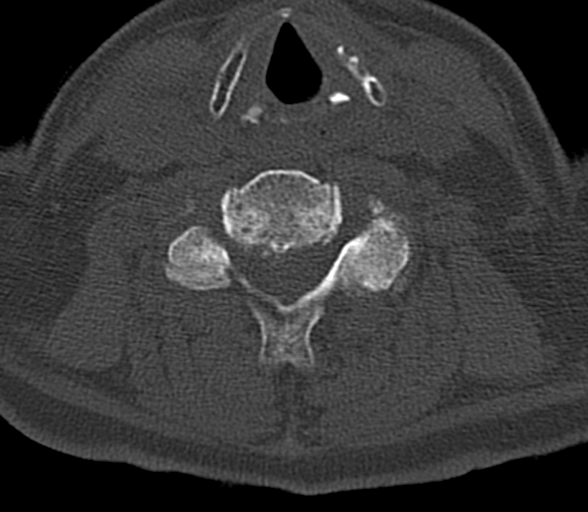
[im 55/82  bone]
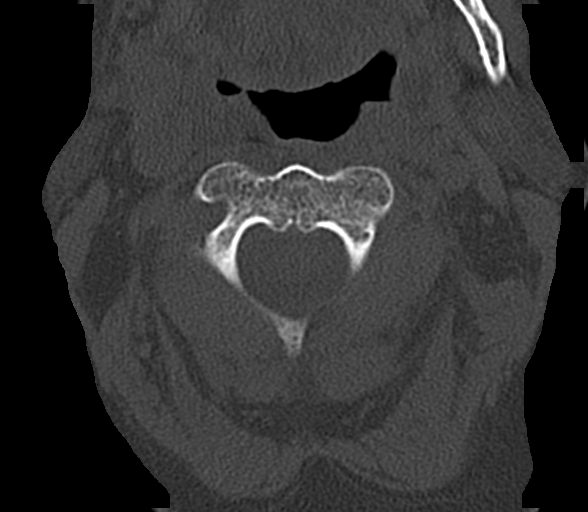
[im 68/82  bone]
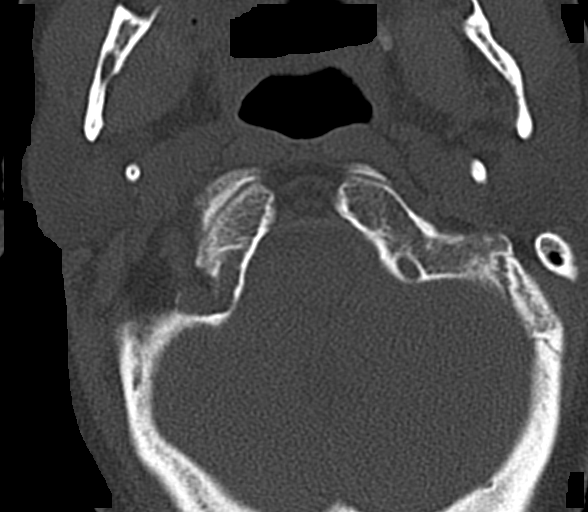

[12 of 33 positions shown; findings below may reference images not displayed]

FINDINGS: CT HEAD FINDINGS

Brain: No evidence of acute infarction, hemorrhage, hydrocephalus,
extra-axial collection, visible mass lesion or mass effect.
Symmetric prominence of the ventricles, cisterns and sulci
compatible with moderate parenchymal volume loss. Patchy and
confluent areas of white matter hypoattenuation are most compatible
with chronic microvascular angiopathy.

Vascular: Atherosclerotic calcification of the carotid siphons. No
hyperdense vessel.

Skull: No calvarial fracture or suspicious osseous lesion. No scalp
swelling or hematoma.

Sinuses/Orbits: Paranasal sinuses and mastoid air cells are
predominantly clear. Included orbital structures are unremarkable.

Other: None.

CT CERVICAL SPINE FINDINGS

Alignment: Stabilization collar absent at the time of examination.
Reversal of the normal cervical lordosis centered at C3 with
stepwise retrolisthesis C3-C6 of 2-3 mm at each level. Finding is
unchanged from comparison radiography 02/18/2012. Favored to be on a
chronic, degenerative basis. No evidence of traumatic listhesis. No
abnormally widened, perched or jumped facets. Normal alignment of
the craniocervical and atlantoaxial articulations.

Skull base and vertebrae: No acute skull base fracture. No vertebral
body fracture or height loss. Normal bone mineralization. Calcific
pannus formation posterior to the dens and at the interspinous
interval C2-C3 nonspecific but can be seen with CPPD or rheumatoid
arthropathy.

Soft tissues and spinal canal: No pre or paravertebral fluid or
swelling. No visible canal hematoma. Airways patent. Bilateral
cervical carotid atherosclerosis.

Disc levels: Multilevel intervertebral disc height loss with
spondylitic endplate changes. Larger disc osteophyte complexes are
present C3-4, C4-5 and C5-6 resulting in mild canal stenosis.
Multilevel uncinate spurring and facet hypertrophic changes result
in mild-to-moderate multilevel neural foraminal narrowing as well.

Upper chest: Accessory azygos fissure. No acute abnormality in the
upper chest or imaged lung apices. Proximal great vessel
calcification.

Other: Normal thyroid.
IMPRESSION: 1. No acute intracranial abnormality. No scalp swelling or calvarial
fracture.
2. Moderate parenchymal volume loss and chronic microvascular
ischemic white matter disease.
3. No acute fracture or traumatic listhesis of the cervical spine.
4. Reversal of the normal cervical lordosis with stepwise
retrolisthesis C3-C6 of 2-3 mm at each level. Favored to be on a
chronic, degenerative basis. Additional multilevel degenerative
changes of the cervical spine as described above.
5. Calcific pannus formation posterior to the dens and at the
interspinous interval C2-C3, nonspecific but can be seen with CPPD
or rheumatoid arthropathy.
6. Bilateral cervical carotid and intracranial atherosclerosis.

## 2022-05-06 ENCOUNTER — Telehealth: Payer: Self-pay

## 2022-05-06 NOTE — Progress Notes (Signed)
Care Management & Coordination Services Pharmacy Team  Reason for Encounter: Medication coordination and delivery  Contacted patient to discuss medications and coordinate delivery from [facility]. Spoke with patient on 05/06/2022  Cycle dispensing form sent to [facility]  for review.   Last adherence delivery date:04/16/22      Patient is due for next adherence delivery on: 05/16/22  Spoke with patient on 05/06/22 reviewed medications and coordinated delivery.  This delivery to include: Adherence Packaging  30 Days  Co Q10 100 mg- take 1 tablet bedtime Venlafaxine ER 75 mg - take 3 tablets  breakfast Mirtazapine 15 mg - take 1 tablet bedtime Pravastatin 80 mg - take 1 tablet bedtime Levothyroxine 50 mcg - take 1 tablet breakfast Aspirin 81 mg - take 1 tablet breakfast Ezetimibe 10 mg - take 1 tablet breakfast Amlodipine 2.5 mg - take 1 tablet by mouth daily breakfast Lisinopril '10mg'$  -take 1 tablet breakfast  Patient declined the following medications this month: none  No refill request needed.  Confirmed delivery date of 05/14/22, advised patient that pharmacy will contact them the morning of delivery.   Any concerns about your medications? No  How often do you forget or accidentally miss a dose? Never  Do you use a pillbox? No  Is patient in packaging Yes  What is the date on your next pill pack? 05/06/22  Any concerns or issues with your packaging? satisfied   Recent blood pressure readings are as follows:none available   Recent blood glucose readings are as follows:none available    Chart review: Recent office visits:  None since last CCM contact  Recent consult visits:  None since last CCM contact  Hospital visits:  None in previous 6 months  Medications: Outpatient Encounter Medications as of 05/06/2022  Medication Sig   Accu-Chek FastClix Lancets MISC USE AS INSTRUCTED TO CHECK BLOOD SUGAR ONCE DAILY   acetaminophen (TYLENOL) 325 MG tablet  Take 650 mg by mouth every 6 (six) hours as needed for moderate pain or headache.   amLODipine (NORVASC) 2.5 MG tablet TAKE ONE TABLET BY MOUTH EVERY MORNING   aspirin EC 81 MG tablet Take 81 mg by mouth daily. Swallow whole.   Blood Glucose Monitoring Suppl (ACCU-CHEK GUIDE) w/Device KIT 1 each by Does not apply route as directed. Use as instructed to check blood sugar once daily   Cholecalciferol (VITAMIN D3) 25 MCG (1000 UT) CAPS Take 2 capsules (2,000 Units total) by mouth daily.   clonazePAM (KLONOPIN) 0.5 MG tablet Take 1 tablet (0.5 mg total) by mouth daily as needed for anxiety (sedation precautions).   denosumab (PROLIA) 60 MG/ML SOSY injection Inject 60 mg into the skin every 6 (six) months.   ezetimibe (ZETIA) 10 MG tablet Take 1 tablet (10 mg total) by mouth daily.   glucose blood (ONETOUCH VERIO) test strip Use as instructed to check blood sugar once a day   levothyroxine (SYNTHROID) 50 MCG tablet TAKE ONE TABLET BY MOUTH ONCE DAILY   lisinopril (ZESTRIL) 10 MG tablet Take 1 tablet (10 mg total) by mouth daily.   loperamide (IMODIUM A-D) 2 MG tablet Take 2 mg by mouth 4 (four) times daily as needed for diarrhea or loose stools.   mirtazapine (REMERON) 15 MG tablet TAKE ONE TABLET BY MOUTH EVERYDAY AT BEDTIME   nitroGLYCERIN (NITROLINGUAL) 0.4 MG/SPRAY spray Place 1 spray under the tongue every 5 (five) minutes as needed.   pravastatin (PRAVACHOL) 80 MG tablet TAKE ONE TABLET BY MOUTH EVERYDAY AT BEDTIME  venlafaxine XR (EFFEXOR-XR) 75 MG 24 hr capsule TAKE THREE CAPSULES BY MOUTH EVERY MORNING   vitamin B-12 (CYANOCOBALAMIN) 1000 MCG tablet Take 1 tablet (1,000 mcg total) by mouth daily.   No facility-administered encounter medications on file as of 05/06/2022.   BP Readings from Last 3 Encounters:  02/12/22 (!) 140/50  01/17/22 (!) 136/58  12/04/21 126/68    Pulse Readings from Last 3 Encounters:  02/12/22 67  01/17/22 84  12/04/21 85    Lab Results  Component Value  Date/Time   HGBA1C 7.1 (A) 06/18/2021 02:36 PM   HGBA1C 6.7 (H) 10/01/2020 12:11 PM   HGBA1C 7.6 (H) 06/18/2020 10:55 AM   Lab Results  Component Value Date   CREATININE 1.61 (H) 01/21/2022   BUN 16 01/21/2022   GFR 32.30 (L) 01/03/2022   GFRNONAA 47 (L) 11/16/2020   GFRAA 46 (L) 01/31/2014   NA 139 01/21/2022   K 4.5 01/21/2022   CALCIUM 9.5 01/21/2022   CO2 19 (L) 01/21/2022     Charlene Brooke, PharmD notified  Avel Sensor, [place] Assistant [phone]

## 2022-06-05 ENCOUNTER — Telehealth: Payer: Self-pay

## 2022-06-05 NOTE — Progress Notes (Signed)
Care Management & Coordination Services Pharmacy Team  Reason for Encounter: Medication coordination and delivery  Contacted patient to discuss medications and coordinate delivery from [facility]. Spoke with patient on 06/05/2022  Cycle dispensing form sent to [facility]  for review.   Last adherence delivery date:05/16/22      Patient is due for next adherence delivery on: 06/16/22  This delivery to include: Adherence Packaging  30 Days  Co Q10 100 mg- take 1 tablet bedtime Venlafaxine ER 75 mg - take 3 tablets  breakfast Mirtazapine 15 mg - take 1 tablet bedtime Pravastatin 80 mg - take 1 tablet bedtime Levothyroxine 50 mcg - take 1 tablet breakfast Aspirin 81 mg - take 1 tablet breakfast Ezetimibe 10 mg - take 1 tablet breakfast Amlodipine 2.5 mg - take 1 tablet by mouth daily breakfast Lisinopril 46m -take 1 tablet breakfast  Patient declined the following medications this month: none  No refill request needed.  Confirmed delivery date of 06/16/22, advised patient that pharmacy will contact them the morning of delivery.   Any concerns about your medications? No  How often do you forget or accidentally miss a dose? Never  Do you use a pillbox? No  Is patient in packaging Yes  What is the date on your next pill pack? Patient not home to read date   Any concerns or issues with your packaging? satisfied   Recent blood pressure readings are as follows: none available  Recent blood glucose readings are as follows: none available   Chart review: Recent office visits:  None since last contact  Recent consult visits:  None since last contact  Hospital visits:  None in previous 6 months  Medications: Outpatient Encounter Medications as of 06/05/2022  Medication Sig   Accu-Chek FastClix Lancets MISC USE AS INSTRUCTED TO CHECK BLOOD SUGAR ONCE DAILY   acetaminophen (TYLENOL) 325 MG tablet Take 650 mg by mouth every 6 (six) hours as needed for moderate pain  or headache.   amLODipine (NORVASC) 2.5 MG tablet TAKE ONE TABLET BY MOUTH EVERY MORNING   aspirin EC 81 MG tablet Take 81 mg by mouth daily. Swallow whole.   Blood Glucose Monitoring Suppl (ACCU-CHEK GUIDE) w/Device KIT 1 each by Does not apply route as directed. Use as instructed to check blood sugar once daily   Cholecalciferol (VITAMIN D3) 25 MCG (1000 UT) CAPS Take 2 capsules (2,000 Units total) by mouth daily.   clonazePAM (KLONOPIN) 0.5 MG tablet Take 1 tablet (0.5 mg total) by mouth daily as needed for anxiety (sedation precautions).   denosumab (PROLIA) 60 MG/ML SOSY injection Inject 60 mg into the skin every 6 (six) months.   ezetimibe (ZETIA) 10 MG tablet Take 1 tablet (10 mg total) by mouth daily.   glucose blood (ONETOUCH VERIO) test strip Use as instructed to check blood sugar once a day   levothyroxine (SYNTHROID) 50 MCG tablet TAKE ONE TABLET BY MOUTH ONCE DAILY   lisinopril (ZESTRIL) 10 MG tablet Take 1 tablet (10 mg total) by mouth daily.   loperamide (IMODIUM A-D) 2 MG tablet Take 2 mg by mouth 4 (four) times daily as needed for diarrhea or loose stools.   mirtazapine (REMERON) 15 MG tablet TAKE ONE TABLET BY MOUTH EVERYDAY AT BEDTIME   nitroGLYCERIN (NITROLINGUAL) 0.4 MG/SPRAY spray Place 1 spray under the tongue every 5 (five) minutes as needed.   pravastatin (PRAVACHOL) 80 MG tablet TAKE ONE TABLET BY MOUTH EVERYDAY AT BEDTIME   venlafaxine XR (EFFEXOR-XR) 75 MG  24 hr capsule TAKE THREE CAPSULES BY MOUTH EVERY MORNING   vitamin B-12 (CYANOCOBALAMIN) 1000 MCG tablet Take 1 tablet (1,000 mcg total) by mouth daily.   No facility-administered encounter medications on file as of 06/05/2022.   BP Readings from Last 3 Encounters:  02/12/22 (!) 140/50  01/17/22 (!) 136/58  12/04/21 126/68    Pulse Readings from Last 3 Encounters:  02/12/22 67  01/17/22 84  12/04/21 85    Lab Results  Component Value Date/Time   HGBA1C 7.1 (A) 06/18/2021 02:36 PM   HGBA1C 6.7 (H)  10/01/2020 12:11 PM   HGBA1C 7.6 (H) 06/18/2020 10:55 AM   Lab Results  Component Value Date   CREATININE 1.61 (H) 01/21/2022   BUN 16 01/21/2022   GFR 32.30 (L) 01/03/2022   GFRNONAA 47 (L) 11/16/2020   GFRAA 46 (L) 01/31/2014   NA 139 01/21/2022   K 4.5 01/21/2022   CALCIUM 9.5 01/21/2022   CO2 19 (L) 01/21/2022     Charlene Brooke, PharmD notified  Avel Sensor, [place] Assistant [phone]

## 2022-06-16 ENCOUNTER — Telehealth: Payer: Self-pay

## 2022-06-16 NOTE — Telephone Encounter (Signed)
Prolia VOB initiated via [email or web address] 

## 2022-06-16 NOTE — Telephone Encounter (Signed)
Please start new PA for Prolia for this patient.  Her next inj is due on or after 06/28/2022.  Thanks, Anda Kraft

## 2022-06-25 ENCOUNTER — Ambulatory Visit (INDEPENDENT_AMBULATORY_CARE_PROVIDER_SITE_OTHER): Payer: PPO | Admitting: Family Medicine

## 2022-06-25 ENCOUNTER — Encounter: Payer: Self-pay | Admitting: Family Medicine

## 2022-06-25 ENCOUNTER — Telehealth: Payer: Self-pay | Admitting: Family Medicine

## 2022-06-25 VITALS — BP 122/66 | HR 74 | Temp 97.4°F | Ht 63.0 in | Wt 136.1 lb

## 2022-06-25 DIAGNOSIS — N183 Chronic kidney disease, stage 3 unspecified: Secondary | ICD-10-CM | POA: Diagnosis not present

## 2022-06-25 DIAGNOSIS — I2510 Atherosclerotic heart disease of native coronary artery without angina pectoris: Secondary | ICD-10-CM | POA: Diagnosis not present

## 2022-06-25 DIAGNOSIS — F332 Major depressive disorder, recurrent severe without psychotic features: Secondary | ICD-10-CM | POA: Diagnosis not present

## 2022-06-25 DIAGNOSIS — R413 Other amnesia: Secondary | ICD-10-CM | POA: Diagnosis not present

## 2022-06-25 DIAGNOSIS — M810 Age-related osteoporosis without current pathological fracture: Secondary | ICD-10-CM | POA: Diagnosis not present

## 2022-06-25 DIAGNOSIS — I5022 Chronic systolic (congestive) heart failure: Secondary | ICD-10-CM | POA: Diagnosis not present

## 2022-06-25 DIAGNOSIS — E1122 Type 2 diabetes mellitus with diabetic chronic kidney disease: Secondary | ICD-10-CM

## 2022-06-25 DIAGNOSIS — R296 Repeated falls: Secondary | ICD-10-CM

## 2022-06-25 DIAGNOSIS — I10 Essential (primary) hypertension: Secondary | ICD-10-CM

## 2022-06-25 DIAGNOSIS — G5603 Carpal tunnel syndrome, bilateral upper limbs: Secondary | ICD-10-CM | POA: Diagnosis not present

## 2022-06-25 DIAGNOSIS — E118 Type 2 diabetes mellitus with unspecified complications: Secondary | ICD-10-CM

## 2022-06-25 NOTE — Assessment & Plan Note (Addendum)
Chronic, stable on lisinopril and amlodipine.

## 2022-06-25 NOTE — Progress Notes (Signed)
Patient ID: Kathleen Williamson, female    DOB: [birth year], 82 y.o.   MRN: [id number]  This visit was conducted in person.  BP 122/66   Pulse 74   Temp (!) 97.4 F (36.3 C) (Temporal)   Ht '5\' 3"'$  (1.6 m)   Wt 136 lb 2 oz (61.7 kg)   LMP  (LMP Unknown)   SpO2 98%   BMI 24.11 kg/m    CC: requests DMV driving form filled out  Subjective:   HPI: Kathleen Williamson is a 82 y.o. female presenting on 06/25/2022 for Form Completion (Needs [place] DMV driver's license form completed. Wants to discuss RSV vaccine. )   Needs letter for [facility] filed out for ongoing driving.  Needs this by 07/05/2022.  She continues driving without concerns.  She does not have signs of cognitive decline. Prior memory impairment issue was isolated episode that improved after treatment of UTI 2022.   OP - on prolia last injection 12/27/2021. She has tolerated this well and is due for repeat.   Diet controlled diabetes.  Lab Results  Component Value Date   HGBA1C 7.1 (A) 06/18/2021    No falls in the past 6+ months.   Longstanding MDD - continues effexor 225 XR daily with remeron '15mg'$  nightly. she has not taken klonopin in almost 2 years - will remove from list.    Seeing cardiology for CAD s/p CABG, carotid disease, cardiomyopathy with sCHF, and LBBB with reduced EF to 30-35%.      Relevant past medical, surgical, family and social history reviewed and updated as indicated. Interim medical history since our last visit reviewed. Allergies and medications reviewed and updated. Outpatient Medications Prior to Visit  Medication Sig Dispense Refill   Accu-Chek FastClix Lancets MISC USE AS INSTRUCTED TO CHECK BLOOD SUGAR ONCE DAILY 102 each 1   acetaminophen (TYLENOL) 325 MG tablet Take 650 mg by mouth every 6 (six) hours as needed for moderate pain or headache.     amLODipine (NORVASC) 2.5 MG tablet TAKE ONE TABLET BY MOUTH EVERY MORNING 90 tablet 2   aspirin EC 81 MG tablet Take 81 mg by mouth daily. Swallow whole.      Blood Glucose Monitoring Suppl (ACCU-CHEK GUIDE) w/Device KIT 1 each by Does not apply route as directed. Use as instructed to check blood sugar once daily 1 kit 0   Cholecalciferol (VITAMIN D3) 25 MCG (1000 UT) CAPS Take 2 capsules (2,000 Units total) by mouth daily. 30 capsule    denosumab (PROLIA) 60 MG/ML SOSY injection Inject 60 mg into the skin every 6 (six) months.     ezetimibe (ZETIA) 10 MG tablet Take 1 tablet (10 mg total) by mouth daily. 90 tablet 3   glucose blood (ONETOUCH VERIO) test strip Use as instructed to check blood sugar once a day 100 each 3   levothyroxine (SYNTHROID) 50 MCG tablet TAKE ONE TABLET BY MOUTH ONCE DAILY 90 tablet 2   lisinopril (ZESTRIL) 10 MG tablet Take 1 tablet (10 mg total) by mouth daily. 90 tablet 3   loperamide (IMODIUM A-D) 2 MG tablet Take 2 mg by mouth 4 (four) times daily as needed for diarrhea or loose stools.     mirtazapine (REMERON) 15 MG tablet TAKE ONE TABLET BY MOUTH EVERYDAY AT BEDTIME 30 tablet 6   nitroGLYCERIN (NITROLINGUAL) 0.4 MG/SPRAY spray Place 1 spray under the tongue every 5 (five) minutes as needed. 12 g prn   pravastatin (PRAVACHOL) 80 MG tablet TAKE ONE  TABLET BY MOUTH EVERYDAY AT BEDTIME 90 tablet 3   venlafaxine XR (EFFEXOR-XR) 75 MG 24 hr capsule TAKE THREE CAPSULES BY MOUTH EVERY MORNING 270 capsule 2   vitamin B-12 (CYANOCOBALAMIN) 1000 MCG tablet Take 1 tablet (1,000 mcg total) by mouth daily.     clonazePAM (KLONOPIN) 0.5 MG tablet Take 1 tablet (0.5 mg total) by mouth daily as needed for anxiety (sedation precautions). 30 tablet 0   No facility-administered medications prior to visit.     Per HPI unless specifically indicated in ROS section below Review of Systems  Objective:  BP 122/66   Pulse 74   Temp (!) 97.4 F (36.3 C) (Temporal)   Ht '5\' 3"'$  (1.6 m)   Wt 136 lb 2 oz (61.7 kg)   LMP  (LMP Unknown)   SpO2 98%   BMI 24.11 kg/m   Wt Readings from Last 3 Encounters:  06/25/22 136 lb 2 oz (61.7 kg)   02/12/22 135 lb (61.2 kg)  01/17/22 134 lb (60.8 kg)      Physical Exam Vitals and nursing note reviewed.  Constitutional:      Appearance: Normal appearance. She is not ill-appearing.  HENT:     Mouth/Throat:     Comments: Wearing mask Cardiovascular:     Rate and Rhythm: Normal rate and regular rhythm.     Pulses: Normal pulses.     Heart sounds: Normal heart sounds. No murmur heard. Pulmonary:     Effort: Pulmonary effort is normal. No respiratory distress.     Breath sounds: Normal breath sounds. No wheezing, rhonchi or rales.  Musculoskeletal:     Right lower leg: No edema.     Left lower leg: No edema.  Skin:    General: Skin is warm and dry.     Findings: No rash.  Neurological:     Mental Status: She is alert.  Psychiatric:        Mood and Affect: Mood normal.        Behavior: Behavior normal.       Results for orders placed or performed in visit on 02/03/22  ECHOCARDIOGRAM COMPLETE  Result Value Ref Range   Area-P 1/2 2.70 cm2   S' Lateral 4.30 cm      06/25/2022    2:43 PM 09/17/2021    4:12 PM 08/21/2021    2:37 PM 10/30/2020    2:46 PM 06/18/2020   11:17 AM  Depression screen PHQ 2/9  Decreased Interest 0 '1 3 2 '$ 0  Down, Depressed, Hopeless '1 1 3 2 1  '$ PHQ - 2 Score '1 2 6 4 1  '$ Altered sleeping 0 0 2 2 0  Tired, decreased energy 0 '1 2 2 '$ 0  Change in appetite 0 0 '2 2 1  '$ Feeling bad or failure about yourself  0 0 0 1 0  Trouble concentrating 0 0 2 1 0  Moving slowly or fidgety/restless 0 0 0 0 0  Suicidal thoughts 0 0 0 0 0  PHQ-9 Score '1 3 14 12 2  '$ Difficult doing work/chores Not difficult at all Somewhat difficult Somewhat difficult Somewhat difficult        06/25/2022    2:44 PM 09/17/2021    4:12 PM 08/21/2021    2:38 PM 10/30/2020    2:46 PM  GAD 7 : Generalized Anxiety Score  Nervous, Anxious, on Edge 0 1 1   Control/stop worrying '1 1 2 1  '$ Worry too much - different things 0 1 1  0  Trouble relaxing 0 1 1 0  Restless 0 0 0 0  Easily annoyed or  irritable 0 0 0 0  Afraid - awful might happen 0 0 0 0  Total GAD 7 Score '1 4 5   '$ Anxiety Difficulty Not difficult at all Not difficult at all Somewhat difficult Not difficult at all   Assessment & Plan:   Problem List Items Addressed This Visit     Atherosclerosis of native coronary artery of native heart without angina pectoris   MDD (major depressive disorder), recurrent severe, without psychosis ([place]) - Primary    Chronic, good control on current regimen of effexor and mirtazapine.  Letter for [facility] written today.  She has been off klonopin for over a year - last filled early 2022. Will remove from problem list.       Osteoporosis    Continue prolia injections, latest 12/27/2021.  She is due for another on Friday - will work on approval process.  Check BMP today.       Relevant Orders   Basic metabolic panel   CKD stage 3 due to type 2 diabetes mellitus ([place])    Check BMP with upcoming planned prolia shot       Diabetes mellitus type 2, controlled, with complications (HCC)    Chronic, diet controlled. Update A1c.       Relevant Orders   Hemoglobin A1c   Recurrent falls    No falls in the past 1+ years - actually better since she stopped klonopin.       Hypertension    Chronic, stable on lisinopril and amlodipine.       Memory impairment    No signs of ongoing cognitive deficit - isolated episode 123456 due to complicated UTI.       Bilateral carpal tunnel syndrome    Marked improvement in hand pain after bilateral carpal tunnel injections.       Chronic HFrEF (heart failure with reduced ejection fraction) ([place])    Now seeing cardiology, on lisinopril '10mg'$  daily.         No orders of the defined types were placed in this encounter.   Orders Placed This Encounter  Procedures   Basic metabolic panel   Hemoglobin A1c    Patient Instructions  Letter for [facility] written today.  Fingerstick A1c today.  Mood questionnaire today.  Good to see you today.    Follow up plan: Return if symptoms worsen or fail to improve.  [name], MD

## 2022-06-25 NOTE — Assessment & Plan Note (Signed)
Now seeing cardiology, on lisinopril '10mg'$  daily.

## 2022-06-25 NOTE — Assessment & Plan Note (Signed)
No falls in the past 1+ years - actually better since she stopped klonopin.

## 2022-06-25 NOTE — Assessment & Plan Note (Signed)
Chronic, good control on current regimen of effexor and mirtazapine.  Letter for [facility] written today.  She has been off klonopin for over a year - last filled early 2022. Will remove from problem list.

## 2022-06-25 NOTE — Assessment & Plan Note (Signed)
Chronic, diet controlled. Update A1c.

## 2022-06-25 NOTE — Assessment & Plan Note (Signed)
Check BMP with upcoming planned prolia shot

## 2022-06-25 NOTE — Assessment & Plan Note (Signed)
Continue prolia injections, latest 12/27/2021.  She is due for another on Friday - will work on approval process.  Check BMP today.

## 2022-06-25 NOTE — Assessment & Plan Note (Signed)
No signs of ongoing cognitive deficit - isolated episode 123456 due to complicated UTI.

## 2022-06-25 NOTE — Assessment & Plan Note (Signed)
Marked improvement in hand pain after bilateral carpal tunnel injections.

## 2022-06-25 NOTE — Telephone Encounter (Signed)
Pt will be due for prolia - can we price this out for her? Thank you.

## 2022-06-25 NOTE — Patient Instructions (Addendum)
Letter for [facility] written today.  Fingerstick A1c today.  Mood questionnaire today.  Good to see you today.

## 2022-06-26 LAB — BASIC METABOLIC PANEL
BUN: 22 mg/dL (ref 6–23)
CO2: 22 mEq/L (ref 19–32)
Calcium: 9.9 mg/dL (ref 8.4–10.5)
Chloride: 108 mEq/L (ref 96–112)
Creatinine, Ser: 1.64 mg/dL — ABNORMAL HIGH (ref 0.40–1.20)
GFR: 29.39 mL/min — ABNORMAL LOW (ref >60.00)
Glucose, Bld: 131 mg/dL — ABNORMAL HIGH (ref 70–99)
Potassium: 4.5 mEq/L (ref 3.5–5.1)
Sodium: 138 mEq/L (ref 135–145)

## 2022-06-26 LAB — HEMOGLOBIN A1C: Hgb A1c MFr Bld: 7.5 % — ABNORMAL HIGH (ref 4.6–6.5)

## 2022-06-30 NOTE — Telephone Encounter (Signed)
Message has been sent to [place] team to verify will reach out to patient once received.

## 2022-06-30 NOTE — Telephone Encounter (Signed)
Do we have auth on this yet?

## 2022-07-01 ENCOUNTER — Ambulatory Visit: Payer: PPO

## 2022-07-02 ENCOUNTER — Other Ambulatory Visit: Payer: Self-pay | Admitting: Family Medicine

## 2022-07-02 DIAGNOSIS — E039 Hypothyroidism, unspecified: Secondary | ICD-10-CM

## 2022-07-04 ENCOUNTER — Telehealth: Payer: Self-pay

## 2022-07-04 ENCOUNTER — Other Ambulatory Visit (HOSPITAL_COMMUNITY): Payer: Self-pay

## 2022-07-04 NOTE — Progress Notes (Unsigned)
Care Management & Coordination Services Pharmacy Team  Reason for Encounter: Medication coordination and delivery  Contacted patient to discuss medications and coordinate delivery from [facility]. {US HC Outreach:28874} Cycle dispensing form sent to *** for review.   Last adherence delivery date:06/16/22      Patient is due for next adherence delivery on: 07/16/22  This delivery to include: Adherence Packaging  30 Days  Co Q10 100 mg- take 1 tablet bedtime Venlafaxine ER 75 mg - take 3 tablets  breakfast Mirtazapine 15 mg - take 1 tablet bedtime Pravastatin 80 mg - take 1 tablet bedtime Levothyroxine 50 mcg - take 1 tablet breakfast Aspirin 81 mg - take 1 tablet breakfast Ezetimibe 10 mg - take 1 tablet breakfast Amlodipine 2.5 mg - take 1 tablet by mouth daily breakfast Lisinopril 10mg  -take 1 tablet breakfast   Patient declined the following medications this month: ***  {refills needed:25320}  {Delivery UJ:6107908   Any concerns about your medications? {yes/no:20286}  How often do you forget or accidentally miss a dose? {Missed doses:25554}  Do you use a pillbox? {yes/no:20286}  Is patient in packaging {yes/no:20286}  If yes  What is the date on your next pill pack?  Any concerns or issues with your packaging?   Recent blood pressure readings are as follows:***  Recent blood glucose readings are as follows:***   Chart review: Recent office visits:  ***  Recent consult visits:  [facility] visits:  None in previous 6 months  Medications: Outpatient Encounter Medications as of 07/04/2022  Medication Sig   Accu-Chek FastClix Lancets MISC USE AS INSTRUCTED TO CHECK BLOOD SUGAR ONCE DAILY   acetaminophen (TYLENOL) 325 MG tablet Take 650 mg by mouth every 6 (six) hours as needed for moderate pain or headache.   amLODipine (NORVASC) 2.5 MG tablet TAKE ONE TABLET BY MOUTH EVERY MORNING   aspirin EC 81 MG tablet Take 81 mg by mouth daily. Swallow whole.    Blood Glucose Monitoring Suppl (ACCU-CHEK GUIDE) w/Device KIT 1 each by Does not apply route as directed. Use as instructed to check blood sugar once daily   Cholecalciferol (VITAMIN D3) 25 MCG (1000 UT) CAPS Take 2 capsules (2,000 Units total) by mouth daily.   denosumab (PROLIA) 60 MG/ML SOSY injection Inject 60 mg into the skin every 6 (six) months.   ezetimibe (ZETIA) 10 MG tablet Take 1 tablet (10 mg total) by mouth daily.   glucose blood (ONETOUCH VERIO) test strip Use as instructed to check blood sugar once a day   levothyroxine (SYNTHROID) 50 MCG tablet TAKE ONE TABLET BY MOUTH ONCE DAILY   lisinopril (ZESTRIL) 10 MG tablet Take 1 tablet (10 mg total) by mouth daily.   loperamide (IMODIUM A-D) 2 MG tablet Take 2 mg by mouth 4 (four) times daily as needed for diarrhea or loose stools.   mirtazapine (REMERON) 15 MG tablet TAKE ONE TABLET BY MOUTH EVERYDAY AT BEDTIME   nitroGLYCERIN (NITROLINGUAL) 0.4 MG/SPRAY spray Place 1 spray under the tongue every 5 (five) minutes as needed.   pravastatin (PRAVACHOL) 80 MG tablet TAKE ONE TABLET BY MOUTH EVERYDAY AT BEDTIME   venlafaxine XR (EFFEXOR-XR) 75 MG 24 hr capsule TAKE THREE CAPSULES BY MOUTH EVERY MORNING   vitamin B-12 (CYANOCOBALAMIN) 1000 MCG tablet Take 1 tablet (1,000 mcg total) by mouth daily.   No facility-administered encounter medications on file as of 07/04/2022.   BP Readings from Last 3 Encounters:  06/25/22 122/66  02/12/22 (!) 140/50  01/17/22 Marland Kitchen)  136/58    Pulse Readings from Last 3 Encounters:  06/25/22 74  02/12/22 67  01/17/22 84    Lab Results  Component Value Date/Time   HGBA1C 7.5 (H) 06/25/2022 02:42 PM   HGBA1C 7.1 (A) 06/18/2021 02:36 PM   HGBA1C 6.7 (H) 10/01/2020 12:11 PM   Lab Results  Component Value Date   CREATININE 1.64 (H) 06/25/2022   BUN 22 06/25/2022   GFR 29.39 (L) 06/25/2022   GFRNONAA 47 (L) 11/16/2020   GFRAA 46 (L) 01/31/2014   NA 138 06/25/2022   K 4.5 06/25/2022   CALCIUM 9.9  06/25/2022   CO2 22 06/25/2022     Charlene Brooke, PharmD notified  Avel Sensor, [place] Assistant [phone]

## 2022-07-04 NOTE — Telephone Encounter (Signed)
Pt ready for scheduling for Prolia 60mg  on or after : 07/04/22  Out-of-pocket cost due at time of visit: $322  Primary: [employer] Prolia co-insurance: 20% Admin fee co-insurance: $20  Secondary: --- [place] co-insurance:  Admin fee co-insurance:   Medical Benefit Details: Date Benefits were checked: 06/17/22 Deductible: NO/ Coinsurance: 20%/ Admin Fee: $20  Prior Auth: N/A PA# Expiration Date:    Pharmacy benefit: Copay $200 If patient wants fill through the pharmacy benefit please send prescription to:  [facility] , and include estimated need by date in rx notes. Pharmacy will ship medication directly to the office.  Patient NOT eligible for Prolia Copay Card. Copay Card can make patient's cost as little as $25. Link to apply: [email or web address]://[email or web address].[email or web address]  ** This summary of benefits is an estimation of the patient's out-of-pocket cost. Exact cost may very based on individual plan coverage.

## 2022-07-04 NOTE — Telephone Encounter (Signed)
Called [employer]  20% coinsurance, $20 copay, No deductible, No PA

## 2022-07-08 ENCOUNTER — Other Ambulatory Visit: Payer: Self-pay

## 2022-07-08 DIAGNOSIS — M810 Age-related osteoporosis without current pathological fracture: Secondary | ICD-10-CM

## 2022-07-09 DIAGNOSIS — H2513 Age-related nuclear cataract, bilateral: Secondary | ICD-10-CM | POA: Diagnosis not present

## 2022-07-09 DIAGNOSIS — E119 Type 2 diabetes mellitus without complications: Secondary | ICD-10-CM | POA: Diagnosis not present

## 2022-07-09 LAB — HM DIABETES EYE EXAM

## 2022-07-14 ENCOUNTER — Other Ambulatory Visit: Payer: PPO

## 2022-07-16 ENCOUNTER — Ambulatory Visit: Payer: PPO

## 2022-07-16 ENCOUNTER — Telehealth: Payer: Self-pay | Admitting: Family Medicine

## 2022-07-16 NOTE — Telephone Encounter (Signed)
Kathleen Williamson-This patient had a prolia injection on 9.16.23 and it is still in insurance responsibility...do you know what is missing?  FYI: Patient opted out of Prolia injection today and future injections because she had a $322 out of pocket, patient was informed of out of pocket in advance, but did not recall  Sending to Dr. Darnell Level as Juluis Rainier.

## 2022-07-17 NOTE — Telephone Encounter (Signed)
See message patient declined due to cost

## 2022-08-04 ENCOUNTER — Telehealth: Payer: Self-pay

## 2022-08-04 NOTE — Progress Notes (Signed)
Care Management & Coordination Services Pharmacy Team  Reason for Encounter: Medication coordination and delivery  Contacted patient to discuss medications and coordinate delivery from [facility] pharmacy. Spoke with patient on 08/06/2022  Cycle dispensing form sent to [facility]  for review.   Last adherence delivery date:07/16/22      Patient is due for next adherence delivery on: 08/14/22  This delivery to include: Adherence Packaging  30 Days  Co Q10 100 mg- take 1 tablet bedtime Venlafaxine ER 75 mg - take 3 tablets  breakfast Mirtazapine 15 mg - take 1 tablet bedtime Pravastatin 80 mg - take 1 tablet bedtime Levothyroxine 50 mcg - take 1 tablet breakfast Aspirin 81 mg - take 1 tablet breakfast Ezetimibe 10 mg - take 1 tablet breakfast Amlodipine 2.5 mg - take 1 tablet by mouth daily breakfast Lisinopril 10mg  -take 1 tablet breakfast  Patient declined the following medications this month: none  Refills requested from providers include:  mirtazapine   Confirmed delivery date of 08/14/22, advised patient that pharmacy will contact them the morning of delivery.   Any concerns about your medications? No  How often do you forget or accidentally miss a dose? Never  Do you use a pillbox? No  Is patient in packaging Yes  What is the date on your next pill pack? Patient is out at breakfast  Any concerns or issues with your packaging? Satisfied    Recent blood pressure readings are as follows:none available   Recent blood glucose readings are as follows:none available   Chart review: Recent office visits:  None since last contact   Recent consult visits:  None since last contact   Hospital visits:  None in previous 6 months  Medications: Outpatient Encounter Medications as of 08/04/2022  Medication Sig   Accu-Chek FastClix Lancets MISC USE AS INSTRUCTED TO CHECK BLOOD SUGAR ONCE DAILY   acetaminophen (TYLENOL) 325 MG tablet Take 650 mg by mouth every 6 (six)  hours as needed for moderate pain or headache.   amLODipine (NORVASC) 2.5 MG tablet TAKE ONE TABLET BY MOUTH EVERY MORNING   aspirin EC 81 MG tablet Take 81 mg by mouth daily. Swallow whole.   Blood Glucose Monitoring Suppl (ACCU-CHEK GUIDE) w/Device KIT 1 each by Does not apply route as directed. Use as instructed to check blood sugar once daily   Cholecalciferol (VITAMIN D3) 25 MCG (1000 UT) CAPS Take 2 capsules (2,000 Units total) by mouth daily.   denosumab (PROLIA) 60 MG/ML SOSY injection Inject 60 mg into the skin every 6 (six) months.   ezetimibe (ZETIA) 10 MG tablet Take 1 tablet (10 mg total) by mouth daily.   glucose blood (ONETOUCH VERIO) test strip Use as instructed to check blood sugar once a day   levothyroxine (SYNTHROID) 50 MCG tablet TAKE ONE TABLET BY MOUTH ONCE DAILY   lisinopril (ZESTRIL) 10 MG tablet Take 1 tablet (10 mg total) by mouth daily.   loperamide (IMODIUM A-D) 2 MG tablet Take 2 mg by mouth 4 (four) times daily as needed for diarrhea or loose stools.   mirtazapine (REMERON) 15 MG tablet TAKE ONE TABLET BY MOUTH EVERYDAY AT BEDTIME   nitroGLYCERIN (NITROLINGUAL) 0.4 MG/SPRAY spray Place 1 spray under the tongue every 5 (five) minutes as needed.   pravastatin (PRAVACHOL) 80 MG tablet TAKE ONE TABLET BY MOUTH EVERYDAY AT BEDTIME   venlafaxine XR (EFFEXOR-XR) 75 MG 24 hr capsule TAKE THREE CAPSULES BY MOUTH EVERY MORNING   vitamin B-12 (CYANOCOBALAMIN) 1000 MCG tablet  Take 1 tablet (1,000 mcg total) by mouth daily.   No facility-administered encounter medications on file as of 08/04/2022.   BP Readings from Last 3 Encounters:  06/25/22 122/66  02/12/22 (!) 140/50  01/17/22 (!) 136/58    Pulse Readings from Last 3 Encounters:  06/25/22 74  02/12/22 67  01/17/22 84    Lab Results  Component Value Date/Time   HGBA1C 7.5 (H) 06/25/2022 02:42 PM   HGBA1C 7.1 (A) 06/18/2021 02:36 PM   HGBA1C 6.7 (H) 10/01/2020 12:11 PM   Lab Results  Component Value Date    CREATININE 1.64 (H) 06/25/2022   BUN 22 06/25/2022   GFR 29.39 (L) 06/25/2022   GFRNONAA 47 (L) 11/16/2020   GFRAA 46 (L) 01/31/2014   NA 138 06/25/2022   K 4.5 06/25/2022   CALCIUM 9.9 06/25/2022   CO2 22 06/25/2022     Al Corpus, PharmD notified  Burt Knack, [facility] Clinical Pharmacy Assistant [phone]

## 2022-08-11 ENCOUNTER — Other Ambulatory Visit: Payer: Self-pay | Admitting: Family Medicine

## 2022-08-11 NOTE — Telephone Encounter (Signed)
Mirtazapine Last filled:  329/24, #30 Last OV: 06/25/22, [place] DMV driver's lic form Next OV:  04/19/08, CPE

## 2022-08-12 NOTE — Progress Notes (Unsigned)
Cardiology Office Note:    Date:  08/14/2022   ID:  Kathleen Williamson, DOB Sep 13, 1940, MRN [id number]  PCP:  Kathleen Boyden, MD   [facility] Providers Cardiologist:  Kathleen Schultz, MD     Referring MD: Kathleen Boyden, MD   Chief Complaint: follow-up cardiomyopathy  History of Present Illness:    Kathleen Williamson is a very pleasant 82 y.o. female with a hx of CAD, HTN, HLD, vertebral artery occlusion, LBBB, diabetes, hypothyroidism, CKD, chronic HFrEF/cardiomyopathy, frequent falls, carotid artery disease  History of coronary artery disease s/p CABG x 5 in 2003 (LIMA-LAD, free right IMA to PDA, SVG-diagonal, SVG-OM2-OM5).  Former patient of Kathleen Williamson and is now followed by Kathleen Williamson.   Her husband died in 10/19/2013.  She has struggled in the past with grief.  Is without any children or family.  Lives at [facility] on [address] and enjoys it there.  Has a cat named Kathleen Williamson.  She underwent right total hip arthroplasty with Dr. Antony Williamson on 11/14/2020.  She underwent Lexiscan stress test which was low risk and showed normal pumping function at that time.  EKG at office visit with Kathleen Searing, NP 10/31/21 revealed new LBBB.  TTE was completed 11/27/21 which revealed LVEF 30 to 35%, global hypokinesis with septal lateral dyssynchrony consistent with LBBB, G1 DD, mildly reduced RV function, no significant valve disease.  She was advised to start Entresto 24-26 mg twice daily, however she developed diarrhea. She was resumed on lisinopril 10 mg daily.   At follow-up visit with Kathleen Williamson on 02/12/2022 she was doing well with no signs of hypervolemia.  Her EKG at baseline shows T wave inversions in the precordial leads which are prolonged changes for her. Recent TTE revealed no improvement in EF. Kathleen Williamson did not recommend pursuing EP consult and 23-month follow-up was recommended.  Today, she is here alone for follow-up. Reports she is doing well. Continues to drive. Lives alone in a retirement  community in an apartment. Says she often gets lonely, admits that she eats out of boredom. Has neighbors that she used to do things with socially but no longer goes out with them. Does not exercise on a consistent basis but has access to the gym on site. Admits she gets short of breath when walking quickly or up an incline. This resolves when she slows down her pace or stops to rest. She denies chest pain, lower extremity edema, fatigue, palpitations, melena, hematuria, hemoptysis, diaphoresis, weakness, presyncope, syncope, orthopnea, and PND.   Past Medical History:  Diagnosis Date   Arthritis    fingers   CAD (coronary artery disease) 2003   MI s/p 5v CABG   Carotid stenosis    Williamson   CKD (chronic kidney disease) stage 3, GFR 30-59 ml/min (HCC)    Dermatomyositis (HCC)    saw Dr Kathleen Williamson, improved on its own   Diabetes mellitus without complication (HCC)    Forehead laceration 09/15/2017   History of chicken pox    History of measles    History of migraine none since 2003   HLD (hyperlipidemia)    Hypothyroidism    MDD (major depressive disorder), recurrent episode, moderate (HCC)    h/o SI after lost husband unexpectedly 2015   Microscopic colitis 05/2014   by colonoscopy, lymphocytic, zoloft related   Myocardial infarction (HCC) 2003   Osteoporosis    DEXA 03/2013 T -3.1, DEXA 10//2016 -2.9 hip, -2.2 spine   Pancreatic cyst  Dr Kathleen Williamson, no f/u needed   Prediabetes 2014   Subclavian steal syndrome    Urine incontinence     Past Surgical History:  Procedure Laterality Date   APPENDECTOMY  1997   with hysterectomy   BACK SURGERY  1981   lower   BREAST LUMPECTOMY Left 1977   benign   COLONOSCOPY WITH PROPOFOL N/A 05/23/2014   microscopic colitis - Kathleen Bumpers, MD   CORONARY ARTERY BYPASS GRAFT  2003   x 5 v   ESOPHAGOGASTRODUODENOSCOPY (EGD) WITH PROPOFOL N/A 05/23/2014   WNL Kathleen Bumpers, MD   ROTATOR CUFF REPAIR Right [phone]   TOOTH EXTRACTION  yrs ago    TOTAL ABDOMINAL HYSTERECTOMY W/ BILATERAL SALPINGOOPHORECTOMY  1997   endometriosis   TOTAL HIP ARTHROPLASTY Right 11/14/2020   Procedure: TOTAL HIP ARTHROPLASTY ANTERIOR APPROACH;  Surgeon: Kathleen Gross, MD;  Location: [facility];  Service: Orthopedics;  Laterality: Right;    Current Medications: Current Meds  Medication Sig   Accu-Chek FastClix Lancets MISC USE AS INSTRUCTED TO CHECK BLOOD SUGAR ONCE DAILY   acetaminophen (TYLENOL) 325 MG tablet Take 650 mg by mouth every 6 (six) hours as needed for moderate pain or headache.   aspirin EC 81 MG tablet Take 81 mg by mouth daily. Swallow whole.   Blood Glucose Monitoring Suppl (ACCU-CHEK GUIDE) w/Device KIT 1 each by Does not apply route as directed. Use as instructed to check blood sugar once daily   Cholecalciferol (VITAMIN D3) 25 MCG (1000 UT) CAPS Take 2 capsules (2,000 Units total) by mouth daily.   citalopram (CELEXA) 20 MG tablet Take 20 mg by mouth daily.   clonazePAM (KLONOPIN) 0.5 MG tablet Take 0.5 mg by mouth daily.   Coenzyme Q10 100 MG capsule Take 100 mg by mouth daily.   denosumab (PROLIA) 60 MG/ML SOSY injection Inject 60 mg into the skin every 6 (six) months.   ezetimibe (ZETIA) 10 MG tablet Take 1 tablet (10 mg total) by mouth daily.   glucose blood (ONETOUCH VERIO) test strip Use as instructed to check blood sugar once a day   levothyroxine (SYNTHROID) 50 MCG tablet TAKE ONE TABLET BY MOUTH ONCE DAILY   lisinopril (ZESTRIL) 10 MG tablet Take 1 tablet (10 mg total) by mouth daily.   loperamide (IMODIUM A-D) 2 MG tablet Take 2 mg by mouth 4 (four) times daily as needed for diarrhea or loose stools.   mirtazapine (REMERON) 15 MG tablet TAKE ONE TABLET BY MOUTH EVERYDAY AT BEDTIME   nitroGLYCERIN (NITROLINGUAL) 0.4 MG/SPRAY spray Place 1 spray under the tongue every 5 (five) minutes as needed.   pravastatin (PRAVACHOL) 80 MG tablet TAKE ONE TABLET BY MOUTH EVERYDAY AT BEDTIME   spironolactone (ALDACTONE) 25 MG tablet Take 0.5  tablets (12.5 mg total) by mouth daily.   venlafaxine XR (EFFEXOR-XR) 75 MG 24 hr capsule TAKE THREE CAPSULES BY MOUTH EVERY MORNING   vitamin B-12 (CYANOCOBALAMIN) 1000 MCG tablet Take 1 tablet (1,000 mcg total) by mouth daily.   [DISCONTINUED] amLODipine (NORVASC) 2.5 MG tablet TAKE ONE TABLET BY MOUTH EVERY MORNING     Allergies:   Sulfa antibiotics and Crestor [rosuvastatin calcium]   Social History   Socioeconomic History   Marital status: Widowed    Spouse name: Not on file   Number of children: Not on file   Years of education: Not on file   Highest education level: Not on file  Occupational History   Not on file  Tobacco Use  Smoking status: Never   Smokeless tobacco: Never  Vaping Use   Vaping Use: Never used  Substance and Sexual Activity   Alcohol use: No   Drug use: No   Sexual activity: Not Currently  Other Topics Concern   Not on file  Social History Narrative   Lives alone at [facility].   Husband of 47 yrs died suddenly 10/08/13.   No children, no siblings.    Close friends Darel Hong and Loann Quill nearby, cousin in [place].   Activity: going to [employer]   Financial Resource Strain: Low Risk  (10/26/2020)   Overall Financial Resource Strain (CARDIA)    Difficulty of Paying Living Expenses: Not very hard  Food Insecurity: No Food Insecurity (12/02/2019)   Hunger Vital Sign    Worried About Running Out of Food in the Last Year: Never true    Ran Out of Food in the Last Year: Never true  Transportation Needs: No Transportation Needs (12/02/2019)   PRAPARE - Administrator, Civil Service (Medical): No    Lack of Transportation (Non-Medical): No  Physical Activity: Not on file  Stress: Not on file  Social Connections: Not on file     Family History: The patient's family history includes CAD in an other family member; CAD (age of onset: 79) in her father; CAD (age of onset: 52) in her mother; Cancer in her maternal aunt;  Diabetes in her mother; Rheum arthritis in her mother. There is no history of Stroke.  ROS:   Please see the history of present illness.    + dyspnea on exertion All other systems reviewed and are negative.  Labs/Other Studies Reviewed:    The following studies were reviewed today:  Cardiac Studies & Procedures     STRESS TESTS  MYOCARDIAL PERFUSION IMAGING 09/14/2020  Narrative  The left ventricular ejection fraction is normal (55-65%).  Nuclear stress EF: 61%.  There was no ST segment deviation noted during stress.  No T wave inversion was noted during stress.  The study is normal.  This is a low risk study.  1. Normal study without ischemia or infarction. 2. Normal LVEF, 61%. 3. This is a low risk study.   ECHOCARDIOGRAM  ECHOCARDIOGRAM COMPLETE 11/27/2021  1. Left ventricular ejection fraction, by estimation, is 30 to 35%. The  left ventricle has moderately decreased function. The left ventricle  demonstrates global hypokinesis with septal-lateral dyssynchrony  consistent with LBBB. Left ventricular  diastolic parameters are consistent with Grade I diastolic dysfunction  (impaired relaxation).   2. Right ventricular systolic function is mildly reduced. The right  ventricular size is normal. Tricuspid regurgitation signal is inadequate  for assessing PA pressure.   3. The mitral valve is normal in structure. Trivial mitral valve  regurgitation. No evidence of mitral stenosis.   4. The aortic valve is tricuspid. Aortic valve regurgitation is not  visualized. No aortic stenosis is present.   5. The inferior vena cava is normal in size with greater than 50%  respiratory variability, suggesting right atrial pressure of 3 mmHg.  ECHOCARDIOGRAM COMPLETE 02/03/2022   1. Left ventricular ejection fraction, by estimation, is 30 to 35%. The left ventricle has moderately decreased function. The left ventricle demonstrates global hypokinesis. Abnormal (paradoxical)  septal motion, consistent with left bundle branch block Left ventricular diastolic parameters are consistent with Grade I diastolic dysfunction (impaired relaxation). 2. Right ventricular systolic function is mildly reduced. The right ventricular size is normal.  Tricuspid regurgitation signal is inadequate for assessing PA pressure. 3. The mitral valve is normal in structure. Trivial mitral valve regurgitation. No evidence of mitral stenosis. 4. The aortic valve is tricuspid. Aortic valve regurgitation is not visualized. No aortic stenosis is present.  FINDINGS Left Ventricle: Left ventricular ejection fraction, by estimation, is 30 to 35%. The left ventricle has moderately decreased function. The left ventricle demonstrates global hypokinesis. 3D left ventricular ejection fraction analysis performed but not reported based on interpreter judgement due to suboptimal tracking. The left ventricular internal cavity size was normal in size. There is no left ventricular hypertrophy. Abnormal (paradoxical) septal motion, consistent with left bundle branch block. Left ventricular diastolic parameters are consistent with Grade I diastolic dysfunction (impaired relaxation).  Right Ventricle: The right ventricular size is normal. Right vetricular wall thickness was not well visualized. Right ventricular systolic function is mildly reduced. Tricuspid regurgitation signal is inadequate for assessing PA pressure.  Left Atrium: Left atrial size was normal in size.  Right Atrium: Right atrial size was normal in size.  Pericardium: There is no evidence of pericardial effusion.  Mitral Valve: The mitral valve is normal in structure. Trivial mitral valve regurgitation. No evidence of mitral valve stenosis.  Tricuspid Valve: The tricuspid valve is normal in structure. Tricuspid valve regurgitation is trivial.  Aortic Valve: The aortic valve is tricuspid. Aortic valve regurgitation is not visualized. No aortic  stenosis is present.  Pulmonic Valve: The pulmonic valve was not well visualized. Pulmonic valve regurgitation is trivial.  Aorta: The aortic root and ascending aorta are structurally normal, with no evidence of dilitation.  IAS/Shunts: The interatrial septum was not well visualized.               Recent Labs: 08/21/2021: Hemoglobin 11.5; Platelets 332.0; TSH 0.83 09/17/2021: ALT 13 06/25/2022: BUN 22; Creatinine, Ser 1.64; Potassium 4.5; Sodium 138  Recent Lipid Panel    Component Value Date/Time   CHOL 149 09/24/2021 0934   CHOL 206 06/20/2014 0000   TRIG 148.0 09/24/2021 0934   TRIG 155 06/20/2014 0000   HDL 45.90 09/24/2021 0934   HDL 39 06/20/2014 0000   CHOLHDL 3 09/24/2021 0934   VLDL 29.6 09/24/2021 0934   LDLCALC 73 09/24/2021 0934   LDLCALC 135 06/20/2014 0000   LDLDIRECT 79.0 09/17/2021 1532     Risk Assessment/Calculations:      Physical Exam:    VS:  BP (!) 110/52 (BP Location: Left Arm, Patient Position: Sitting, Cuff Size: Normal)   Pulse 88   Ht 5\' 3"  (1.6 m)   Wt 134 lb 12.8 oz (61.1 kg)   LMP  (LMP Unknown)   SpO2 98%   BMI 23.88 kg/m     Wt Readings from Last 3 Encounters:  08/14/22 134 lb 12.8 oz (61.1 kg)  06/25/22 136 lb 2 oz (61.7 kg)  02/12/22 135 lb (61.2 kg)     GEN:  Well nourished, well developed in no acute distress HEENT: Normal NECK: No JVD; No carotid bruits CARDIAC: RRR, no murmurs, rubs, gallops RESPIRATORY:  Clear to auscultation without rales, wheezing or rhonchi  ABDOMEN: Soft, non-tender, non-distended MUSCULOSKELETAL:  No edema; No deformity. 2+ pedal pulses, equal bilaterally SKIN: Warm and dry NEUROLOGIC:  Alert and oriented x 3 PSYCHIATRIC:  Normal affect   EKG:  EKG is ordered today.  The ekg ordered today demonstrates normal sinus rhythm with premature atrial complexes at 88 bpm, T wave abnormality lateral leads, no acute change from previous tracing  Diagnoses:    1. Chronic HFrEF (heart failure with  reduced ejection fraction) (HCC)   2. Mixed hyperlipidemia   3. Bilateral carotid artery stenosis   4. LBBB (left bundle branch block)   5. Coronary artery disease involving native coronary artery of native heart without angina pectoris   6. Cardiomyopathy, unspecified type (HCC)    Assessment and Plan:     Cardiomyopathy/Chronic HFrEF: NYHA Class II. LVEF 30-35% on TTE 02/03/22. Has mild DOE that improves when she slows her pace or stops to rest. No edema, weight gain, orthopnea, PND. Appears euvolemic on exam. Weight has been stable. She did not tolerate Entresto in the past. We will try to up-titrate GDMT for CHF.  Will have her discontinue amlodipine and start spironolactone 12.5 mg daily.  Will get BMP in 2 weeks. I will see her back in 3 months for further medication titration.  Encouraged her to gradually increase activity to achieve 30 minutes of walking 5 days/week. Report worsening symptoms of DOE.   CAD without angina: History of remote CABG. She does not exercise consistently. Reports DOE at times which is not associated with additional symptoms of chest pain, n/v, or diaphoresis. She denies chest pain. No indication for further ischemic evaluation at this time. Medication adjustment as noted above. Will plan soon follow-up and further ischemic evaluation if clinically indicated.   Carotid artery disease: Minimal stenosis bilaterally on carotid ultrasound 06/2020. She is asymptomatic. We will continue to monitor clinically at this time.   LBBB: Known. Echo 01/2022 with global hypokinesis, abnormal. Abnormal paradoxical septal motion consistent with left bundle branch block. Per Kathleen Williamson, primary cardiologist, will continue to monitor clinically at this time. Consider EP referral if symptoms worsen.   Hyperlipidemia: LDL 73 on 09/24/2021. We will recheck lipids in 2 weeks when she returns for BMP for medication changes noted above. Encouraged increased activity and heart healthy, mostly  plant based diet.  Continue ezetimibe and pravastatin.      Disposition: 2-3 months with me  Medication Adjustments/Labs and Tests Ordered: Current medicines are reviewed at length with the patient today.  Concerns regarding medicines are outlined above.  Orders Placed This Encounter  Procedures   Comprehensive metabolic panel   Lipid panel   EKG 12-Lead   Meds ordered this encounter  Medications   spironolactone (ALDACTONE) 25 MG tablet    Sig: Take 0.5 tablets (12.5 mg total) by mouth daily.    Dispense:  45 tablet    Refill:  3    Patient Instructions  Medication Instructions:  Your physician has recommended you make the following change in your medication:   Stop taking amlodipine Start taking spironolactone 12.5 mg daily  *If you need a refill on your cardiac medications before your next appointment, please call your pharmacy*   Lab Work: Lipids and Cmet in 2 weeks If you have labs (blood work) drawn today and your tests are completely normal, you will receive your results only by: MyChart Message (if you have MyChart) OR A paper copy in the mail If you have any lab test that is abnormal or we need to change your treatment, we will call you to review the results.   Follow-Up: At [facility], you and your health needs are our priority.  As part of our continuing mission to provide you with exceptional heart care, we have created designated Provider Care Teams.  These Care Teams include your primary Cardiologist (physician) and Advanced Practice Providers (APPs -  Physician Assistants and Nurse Practitioners) who all work together to provide you with the care you need, when you need it.  We recommend signing up for the patient portal called "MyChart".  Sign up information is provided on this After Visit Summary.  MyChart is used to connect with patients for Virtual Visits (Telemedicine).  Patients are able to view lab/test results, encounter notes, upcoming  appointments, etc.  Non-urgent messages can be sent to your provider as well.   To learn more about what you can do with MyChart, go to [email or web address].    Your next appointment:   3 month(s)  Provider:   Eligha Bridegroom, NP         Signed, Levi Aland, NP  08/14/2022 5:24 PM    [facility]

## 2022-08-14 ENCOUNTER — Telehealth: Payer: Self-pay | Admitting: Family Medicine

## 2022-08-14 ENCOUNTER — Encounter: Payer: Self-pay | Admitting: Nurse Practitioner

## 2022-08-14 ENCOUNTER — Ambulatory Visit: Payer: PPO | Attending: Nurse Practitioner | Admitting: Nurse Practitioner

## 2022-08-14 VITALS — BP 110/52 | HR 88 | Ht 63.0 in | Wt 134.8 lb

## 2022-08-14 DIAGNOSIS — E782 Mixed hyperlipidemia: Secondary | ICD-10-CM

## 2022-08-14 DIAGNOSIS — I447 Left bundle-branch block, unspecified: Secondary | ICD-10-CM | POA: Diagnosis not present

## 2022-08-14 DIAGNOSIS — I5022 Chronic systolic (congestive) heart failure: Secondary | ICD-10-CM | POA: Diagnosis not present

## 2022-08-14 DIAGNOSIS — I2510 Atherosclerotic heart disease of native coronary artery without angina pectoris: Secondary | ICD-10-CM | POA: Diagnosis not present

## 2022-08-14 DIAGNOSIS — I6523 Occlusion and stenosis of bilateral carotid arteries: Secondary | ICD-10-CM | POA: Diagnosis not present

## 2022-08-14 DIAGNOSIS — I429 Cardiomyopathy, unspecified: Secondary | ICD-10-CM | POA: Diagnosis not present

## 2022-08-14 MED ORDER — SPIRONOLACTONE 25 MG PO TABS: 12.5000 mg | ORAL_TABLET | Freq: Every day | ORAL | 3 refills | Status: DC

## 2022-08-14 NOTE — Telephone Encounter (Signed)
Called patient to schedule Medicare Annual Wellness Visit (AWV). Left message for patient to call back and schedule Medicare Annual Wellness Visit (AWV).  Last date of AWV: 09/17/2021  Please schedule an appointment at any time with [facility].  If any questions, please contact me at [phone].  Thank you ,   Randon Goldsmith Care Guide [facility] AWV TEAM Direct Dial: [phone]

## 2022-08-14 NOTE — Patient Instructions (Signed)
Medication Instructions:  Your physician has recommended you make the following change in your medication:   Stop taking amlodipine Start taking spironolactone 12.5 mg daily  *If you need a refill on your cardiac medications before your next appointment, please call your pharmacy*   Lab Work: Lipids and Cmet in 2 weeks If you have labs (blood work) drawn today and your tests are completely normal, you will receive your results only by: MyChart Message (if you have MyChart) OR A paper copy in the mail If you have any lab test that is abnormal or we need to change your treatment, we will call you to review the results.   Follow-Up: At [facility], you and your health needs are our priority.  As part of our continuing mission to provide you with exceptional heart care, we have created designated Provider Care Teams.  These Care Teams include your primary Cardiologist (physician) and Advanced Practice Providers (APPs -  Physician Assistants and Nurse Practitioners) who all work together to provide you with the care you need, when you need it.  We recommend signing up for the patient portal called "MyChart".  Sign up information is provided on this After Visit Summary.  MyChart is used to connect with patients for Virtual Visits (Telemedicine).  Patients are able to view lab/test results, encounter notes, upcoming appointments, etc.  Non-urgent messages can be sent to your provider as well.   To learn more about what you can do with MyChart, go to [email or web address].    Your next appointment:   3 month(s)  Provider:   Eligha Bridegroom, NP

## 2022-08-18 ENCOUNTER — Telehealth: Payer: Self-pay

## 2022-08-18 NOTE — Progress Notes (Signed)
Care Management & Coordination Services Pharmacy Team  Reason for Encounter: Medication Change   Spoke with patient on 08/18/2022   Patient calls to report medication change.Recent cardiology visit stopped lisinopril and start spironolactone (1/2 pill) daily  Patient was unsure of which pill was lisinopril in her packs .Pharmacy confirmed pill color and identification on pill. Patient has received the new medication to take. Patient will follow up with labs soon.   Al Corpus, PharmD notified  Burt Knack, [facility] Clinical Pharmacy Assistant [phone]

## 2022-08-20 ENCOUNTER — Telehealth: Payer: Self-pay | Admitting: Family Medicine

## 2022-08-20 NOTE — Telephone Encounter (Signed)
Contacted Kathleen Williamson to schedule their annual wellness visit. Appointment made for 09/05/2022.  [facility] Care Guide [facility] AWV TEAM Direct Dial: [phone]

## 2022-08-28 ENCOUNTER — Ambulatory Visit: Payer: PPO | Attending: Nurse Practitioner

## 2022-08-28 DIAGNOSIS — E782 Mixed hyperlipidemia: Secondary | ICD-10-CM | POA: Diagnosis not present

## 2022-08-28 DIAGNOSIS — I5022 Chronic systolic (congestive) heart failure: Secondary | ICD-10-CM

## 2022-08-29 ENCOUNTER — Other Ambulatory Visit: Payer: Self-pay | Admitting: *Deleted

## 2022-08-29 ENCOUNTER — Telehealth: Payer: Self-pay | Admitting: Cardiology

## 2022-08-29 DIAGNOSIS — E875 Hyperkalemia: Secondary | ICD-10-CM

## 2022-08-29 DIAGNOSIS — I5022 Chronic systolic (congestive) heart failure: Secondary | ICD-10-CM

## 2022-08-29 DIAGNOSIS — I2510 Atherosclerotic heart disease of native coronary artery without angina pectoris: Secondary | ICD-10-CM

## 2022-08-29 DIAGNOSIS — E785 Hyperlipidemia, unspecified: Secondary | ICD-10-CM

## 2022-08-29 LAB — COMPREHENSIVE METABOLIC PANEL
ALT: 13 IU/L (ref 0–32)
AST: 11 IU/L (ref 0–40)
Albumin/Globulin Ratio: 2.2 (ref 1.2–2.2)
Albumin: 4.4 g/dL (ref 3.8–4.8)
Alkaline Phosphatase: 68 IU/L (ref 44–121)
BUN/Creatinine Ratio: 15 (ref 12–28)
BUN: 25 mg/dL (ref 8–27)
Bilirubin Total: 0.3 mg/dL (ref 0.0–1.2)
CO2: 20 mmol/L (ref 20–29)
Calcium: 10 mg/dL (ref 8.7–10.3)
Chloride: 106 mmol/L (ref 96–106)
Creatinine, Ser: 1.63 mg/dL — ABNORMAL HIGH (ref 0.57–1.00)
Globulin, Total: 2 g/dL (ref 1.5–4.5)
Glucose: 152 mg/dL — ABNORMAL HIGH (ref 70–99)
Potassium: 5.5 mmol/L — ABNORMAL HIGH (ref 3.5–5.2)
Sodium: 140 mmol/L (ref 134–144)
Total Protein: 6.4 g/dL (ref 6.0–8.5)
eGFR: 32 mL/min/{1.73_m2} — ABNORMAL LOW (ref >59)

## 2022-08-29 LAB — LIPID PANEL
Chol/HDL Ratio: 3.5 ratio (ref 0.0–4.4)
Cholesterol, Total: 155 mg/dL (ref 100–199)
HDL: 44 mg/dL (ref >39)
LDL Chol Calc (NIH): 78 mg/dL (ref 0–99)
Triglycerides: 196 mg/dL — ABNORMAL HIGH (ref 0–149)
VLDL Cholesterol Cal: 33 mg/dL (ref 5–40)

## 2022-08-29 MED ORDER — ATORVASTATIN CALCIUM 40 MG PO TABS: 40.0000 mg | ORAL_TABLET | Freq: Every day | ORAL | 3 refills | Status: DC

## 2022-08-29 NOTE — Telephone Encounter (Signed)
Patient is returning call to discuss lab results. 

## 2022-09-01 ENCOUNTER — Telehealth: Payer: Self-pay | Admitting: Cardiology

## 2022-09-01 NOTE — Telephone Encounter (Signed)
Pt is aware the lab work she is scheduled for on Wednesday is to check her BMP.  She is scheduled in July to f/u from starting Atorvastatin.  She received her medication today and will start it tonight.  She was appreciative of the call and information.

## 2022-09-01 NOTE — Telephone Encounter (Signed)
Pt c/o medication issue:  1. Name of Medication:   atorvastatin (LIPITOR) 40 MG tablet   2. How are you currently taking this medication (dosage and times per day)?  Not received as yet  3. Are you having a reaction (difficulty breathing--STAT)?   4. What is your medication issue?    Patient stated she has not received her new medication from the pharmacy as yet and wants to know if she still needs to have her blood drawn on 5/22.

## 2022-09-03 ENCOUNTER — Ambulatory Visit: Payer: PPO | Attending: Nurse Practitioner

## 2022-09-03 ENCOUNTER — Telehealth: Payer: Self-pay

## 2022-09-03 DIAGNOSIS — I5022 Chronic systolic (congestive) heart failure: Secondary | ICD-10-CM | POA: Diagnosis not present

## 2022-09-03 DIAGNOSIS — E875 Hyperkalemia: Secondary | ICD-10-CM | POA: Diagnosis not present

## 2022-09-03 DIAGNOSIS — E785 Hyperlipidemia, unspecified: Secondary | ICD-10-CM | POA: Diagnosis not present

## 2022-09-03 DIAGNOSIS — I2510 Atherosclerotic heart disease of native coronary artery without angina pectoris: Secondary | ICD-10-CM | POA: Diagnosis not present

## 2022-09-03 NOTE — Progress Notes (Signed)
Care Management & Coordination Services Pharmacy Team  Reason for Encounter: Medication coordination and delivery  Contacted patient to discuss medications and coordinate delivery from [facility]. {US HC Outreach:28874} Cycle dispensing form sent to *** for review.   Last adherence delivery date:08/14/22      Patient is due for next adherence delivery on: 09/15/22  This delivery to include: Adherence Packaging  30 Days  ***  Patient declined the following medications this month: ***  {refills needed:25320}  {Delivery ZOXW:96045}   Any concerns about your medications? No  How often do you forget or accidentally miss a dose? Never  Do you use a pillbox? No  Is patient in packaging Yes  What is the date on your next pill pack?  Any concerns or issues with your packaging? Satisfied    Recent blood pressure readings are as follows:***  Recent blood glucose readings are as follows:***   Chart review: Recent office visits:  ***  Recent consult visits:  08/29/22-cardiology labs- start atorvastatin 40mg  take 1 tablet daily  Hospital visits:  {Hospital DC Yes/No:21091515}  Medications: Outpatient Encounter Medications as of 09/03/2022  Medication Sig   atorvastatin (LIPITOR) 40 MG tablet Take 1 tablet (40 mg total) by mouth daily.   Accu-Chek FastClix Lancets MISC USE AS INSTRUCTED TO CHECK BLOOD SUGAR ONCE DAILY   acetaminophen (TYLENOL) 325 MG tablet Take 650 mg by mouth every 6 (six) hours as needed for moderate pain or headache.   aspirin EC 81 MG tablet Take 81 mg by mouth daily. Swallow whole.   Blood Glucose Monitoring Suppl (ACCU-CHEK GUIDE) w/Device KIT 1 each by Does not apply route as directed. Use as instructed to check blood sugar once daily   Cholecalciferol (VITAMIN D3) 25 MCG (1000 UT) CAPS Take 2 capsules (2,000 Units total) by mouth daily.   citalopram (CELEXA) 20 MG tablet Take 20 mg by mouth daily.   clonazePAM (KLONOPIN) 0.5 MG tablet Take 0.5  mg by mouth daily.   Coenzyme Q10 100 MG capsule Take 100 mg by mouth daily.   denosumab (PROLIA) 60 MG/ML SOSY injection Inject 60 mg into the skin every 6 (six) months.   ezetimibe (ZETIA) 10 MG tablet Take 1 tablet (10 mg total) by mouth daily.   glucose blood (ONETOUCH VERIO) test strip Use as instructed to check blood sugar once a day   levothyroxine (SYNTHROID) 50 MCG tablet TAKE ONE TABLET BY MOUTH ONCE DAILY   lisinopril (ZESTRIL) 10 MG tablet Take 1 tablet (10 mg total) by mouth daily.   loperamide (IMODIUM A-D) 2 MG tablet Take 2 mg by mouth 4 (four) times daily as needed for diarrhea or loose stools.   mirtazapine (REMERON) 15 MG tablet TAKE ONE TABLET BY MOUTH EVERYDAY AT BEDTIME   nitroGLYCERIN (NITROLINGUAL) 0.4 MG/SPRAY spray Place 1 spray under the tongue every 5 (five) minutes as needed.   venlafaxine XR (EFFEXOR-XR) 75 MG 24 hr capsule TAKE THREE CAPSULES BY MOUTH EVERY MORNING   vitamin B-12 (CYANOCOBALAMIN) 1000 MCG tablet Take 1 tablet (1,000 mcg total) by mouth daily.   No facility-administered encounter medications on file as of 09/03/2022.   BP Readings from Last 3 Encounters:  08/14/22 (!) 110/52  06/25/22 122/66  02/12/22 (!) 140/50    Pulse Readings from Last 3 Encounters:  08/14/22 88  06/25/22 74  02/12/22 67    Lab Results  Component Value Date/Time   HGBA1C 7.5 (H) 06/25/2022 02:42 PM   HGBA1C 7.1 (A) 06/18/2021 02:36 PM  HGBA1C 6.7 (H) 10/01/2020 12:11 PM   Lab Results  Component Value Date   CREATININE 1.63 (H) 08/28/2022   BUN 25 08/28/2022   GFR 29.39 (L) 06/25/2022   GFRNONAA 47 (L) 11/16/2020   GFRAA 46 (L) 01/31/2014   NA 140 08/28/2022   K 5.5 (H) 08/28/2022   CALCIUM 10.0 08/28/2022   CO2 20 08/28/2022     Al Corpus, PharmD notified  Burt Knack, [facility] Clinical Pharmacy Assistant [phone]

## 2022-09-04 ENCOUNTER — Ambulatory Visit: Payer: PPO

## 2022-09-04 LAB — BASIC METABOLIC PANEL
BUN/Creatinine Ratio: 13 (ref 12–28)
BUN: 22 mg/dL (ref 8–27)
CO2: 20 mmol/L (ref 20–29)
Calcium: 9.6 mg/dL (ref 8.7–10.3)
Chloride: 103 mmol/L (ref 96–106)
Creatinine, Ser: 1.72 mg/dL — ABNORMAL HIGH (ref 0.57–1.00)
Glucose: 145 mg/dL — ABNORMAL HIGH (ref 70–99)
Potassium: 4.6 mmol/L (ref 3.5–5.2)
Sodium: 136 mmol/L (ref 134–144)
eGFR: 30 mL/min/{1.73_m2} — ABNORMAL LOW (ref >59)

## 2022-09-04 NOTE — Progress Notes (Signed)
I connected with  Kathleen Williamson on 09/05/2022 by a audio enabled telemedicine application and verified that I am speaking with the correct person using two identifiers.  Patient Location: Home  Provider Location: Home Office  I discussed the limitations of evaluation and management by telemedicine. The patient expressed understanding and agreed to proceed.   Subjective:   Kathleen Williamson is a 82 y.o. female who presents for Medicare Annual (Subsequent) preventive examination.  Review of Systems    Per HPI unless specifically indicated below.  Cardiac Risk Factors include: advanced age (>52men, >19 women);female gender          Objective:       08/14/2022    1:29 PM 06/25/2022    2:02 PM 02/12/2022   10:50 AM  Vitals with BMI  Height 5\' 3"  5\' 3"  5\' 3"   Weight 134 lbs 13 oz 136 lbs 2 oz 135 lbs  BMI 23.88 24.12 23.92  Systolic 110 122 782  Diastolic 52 66 50  Pulse 88 74 67    There were no vitals filed for this visit. There is no height or weight on file to calculate BMI.     09/05/2022   10:25 AM 11/14/2020    3:30 PM 11/06/2020    1:25 PM 10/29/2020    3:39 PM 10/26/2020    1:03 PM 10/06/2020    6:08 PM 02/16/2018    9:49 AM  Advanced Directives  Does Patient Have a Medical Advance Directive? Yes No No No No No Yes  Type of Estate agent of [place];Living will      Healthcare Power of [place];Living will  Does patient want to make changes to medical advance directive? No - Patient declined        Copy of Healthcare Power of Attorney in Chart? No - copy requested      No - copy requested  Would patient like information on creating a medical advance directive? No - Patient declined No - Patient declined No - Patient declined No - Patient declined       Current Medications (verified) Outpatient Encounter Medications as of 09/05/2022  Medication Sig   Accu-Chek FastClix Lancets MISC USE AS INSTRUCTED TO CHECK BLOOD SUGAR ONCE DAILY   acetaminophen  (TYLENOL) 325 MG tablet Take 650 mg by mouth every 6 (six) hours as needed for moderate pain or headache.   amLODipine (NORVASC) 2.5 MG tablet Take 2.5 mg by mouth daily.   aspirin EC 81 MG tablet Take 81 mg by mouth daily. Swallow whole.   atorvastatin (LIPITOR) 40 MG tablet Take 1 tablet (40 mg total) by mouth daily.   Blood Glucose Monitoring Suppl (ACCU-CHEK GUIDE) w/Device KIT 1 each by Does not apply route as directed. Use as instructed to check blood sugar once daily   Cholecalciferol (VITAMIN D3) 25 MCG (1000 UT) CAPS Take 2 capsules (2,000 Units total) by mouth daily.   Coenzyme Q10 100 MG capsule Take 100 mg by mouth daily.   ezetimibe (ZETIA) 10 MG tablet Take 1 tablet (10 mg total) by mouth daily.   levothyroxine (SYNTHROID) 50 MCG tablet TAKE ONE TABLET BY MOUTH ONCE DAILY   lisinopril (ZESTRIL) 10 MG tablet Take 1 tablet (10 mg total) by mouth daily.   mirtazapine (REMERON) 15 MG tablet TAKE ONE TABLET BY MOUTH EVERYDAY AT BEDTIME   nitroGLYCERIN (NITROLINGUAL) 0.4 MG/SPRAY spray Place 1 spray under the tongue every 5 (five) minutes as needed.   venlafaxine XR (EFFEXOR-XR)  75 MG 24 hr capsule TAKE THREE CAPSULES BY MOUTH EVERY MORNING   vitamin B-12 (CYANOCOBALAMIN) 1000 MCG tablet Take 1 tablet (1,000 mcg total) by mouth daily.   citalopram (CELEXA) 20 MG tablet Take 20 mg by mouth daily. (Patient not taking: Reported on 09/05/2022)   clonazePAM (KLONOPIN) 0.5 MG tablet Take 0.5 mg by mouth daily. (Patient not taking: Reported on 09/05/2022)   denosumab (PROLIA) 60 MG/ML SOSY injection Inject 60 mg into the skin every 6 (six) months.   glucose blood (ONETOUCH VERIO) test strip Use as instructed to check blood sugar once a day   loperamide (IMODIUM A-D) 2 MG tablet Take 2 mg by mouth 4 (four) times daily as needed for diarrhea or loose stools. (Patient not taking: Reported on 09/05/2022)   No facility-administered encounter medications on file as of 09/05/2022.    Allergies  (verified) Sulfa antibiotics and Crestor [rosuvastatin calcium]   History: Past Medical History:  Diagnosis Date   Arthritis    fingers   CAD (coronary artery disease) 2003   MI s/p 5v CABG   Carotid stenosis    Skains   CKD (chronic kidney disease) stage 3, GFR 30-59 ml/min (HCC)    Dermatomyositis (HCC)    saw Dr Corliss Skains, improved on its own   Diabetes mellitus without complication (HCC)    Forehead laceration 09/15/2017   History of chicken pox    History of measles    History of migraine none since 2003   HLD (hyperlipidemia)    Hypothyroidism    MDD (major depressive disorder), recurrent episode, moderate (HCC)    h/o SI after lost husband unexpectedly 2015   Microscopic colitis 05/2014   by colonoscopy, lymphocytic, zoloft related   Myocardial infarction (HCC) 2003   Osteoporosis    DEXA 03/2013 T -3.1, DEXA 10//2016 -2.9 hip, -2.2 spine   Pancreatic cyst    Dr Arline Asp, no f/u needed   Prediabetes 2014   Subclavian steal syndrome    Urine incontinence    Past Surgical History:  Procedure Laterality Date   APPENDECTOMY  1997   with hysterectomy   BACK SURGERY  1981   lower   BREAST LUMPECTOMY Left 1977   benign   COLONOSCOPY WITH PROPOFOL N/A 05/23/2014   microscopic colitis - Charolett Bumpers, MD   CORONARY ARTERY BYPASS GRAFT  2003   x 5 v   ESOPHAGOGASTRODUODENOSCOPY (EGD) WITH PROPOFOL N/A 05/23/2014   WNL Charolett Bumpers, MD   ROTATOR CUFF REPAIR Right [phone]   TOOTH EXTRACTION  yrs ago   TOTAL ABDOMINAL HYSTERECTOMY W/ BILATERAL SALPINGOOPHORECTOMY  1997   endometriosis   TOTAL HIP ARTHROPLASTY Right 11/14/2020   Procedure: TOTAL HIP ARTHROPLASTY ANTERIOR APPROACH;  Surgeon: Ollen Gross, MD;  Location: [facility];  Service: Orthopedics;  Laterality: Right;   Family History  Problem Relation Age of Onset   CAD Father 1   CAD Mother 77   Diabetes Mother    Rheum arthritis Mother    Cancer Maternal Aunt        breast   CAD Other        strong  paternal side   Stroke Neg Hx    Social History   Socioeconomic History   Marital status: Widowed    Spouse name: Not on file   Number of children: 0   Years of education: Not on file   Highest education level: Not on file  Occupational History   Occupation: Retired  Tobacco Use  Smoking status: Never   Smokeless tobacco: Never  Vaping Use   Vaping Use: Never used  Substance and Sexual Activity   Alcohol use: No   Drug use: No   Sexual activity: Not Currently  Other Topics Concern   Not on file  Social History Narrative   L[facility] at [facility].   Husband of 47 yrs died suddenly 05-Oct-2013.   No children, no siblings.    Close friends Darel Hong and Loann Quill nearby, cousin in [place].   Activity: going to [employer]   Financial Resource Strain: Low Risk  (09/05/2022)   Overall Financial Resource Strain (CARDIA)    Difficulty of Paying Living Expenses: Not hard at all  Food Insecurity: No Food Insecurity (09/05/2022)   Hunger Vital Sign    Worried About Running Out of Food in the Last Year: Never true    Ran Out of Food in the Last Year: Never true  Transportation Needs: No Transportation Needs (09/05/2022)   PRAPARE - Administrator, Civil Service (Medical): No    Lack of Transportation (Non-Medical): No  Physical Activity: Inactive (09/05/2022)   Exercise Vital Sign    Days of Exercise per Week: 0 days    Minutes of Exercise per Session: 0 min  Stress: No Stress Concern Present (09/05/2022)   [employer] of Occupational Health - Occupational Stress Questionnaire    Feeling of Stress : Not at all  Social Connections: Moderately Integrated (09/05/2022)   Social Connection and Isolation Panel [NHANES]    Frequency of Communication with Friends and Family: More than three times a week    Frequency of Social Gatherings with Friends and Family: Once a week    Attends Religious Services: More than 4 times per year    Active Member of  [employer] or Organizations: Yes    Attends Banker Meetings: Never    Marital Status: Widowed    Tobacco Counseling Counseling given: No   Clinical Intake:  Pre-visit preparation completed: No  Pain : No/denies pain     Nutritional Status: BMI of 19-24  Normal Nutritional Risks: None Diabetes: Yes CBG done?: No Did pt. bring in CBG monitor from home?: No  How often do you need to have someone help you when you read instructions, pamphlets, or other written materials from your doctor or pharmacy?: 1 - Never  Diabetic?Nutrition Risk Assessment:  Has the patient had any N/V/D within the last 2 months?  No  Does the patient have any non-healing wounds?  No  Has the patient had any unintentional weight loss or weight gain?  No   Diabetes:  Is the patient diabetic?  Yes  If diabetic, was a CBG obtained today?  No  Did the patient bring in their glucometer from home?  No  How often do you monitor your CBG's? Occasionally .   Financial Strains and Diabetes Management:  Are you having any financial strains with the device, your supplies or your medication? No .  Does the patient want to be seen by Chronic Care Management for management of their diabetes?  No  Would the patient like to be referred to a Nutritionist or for Diabetic Management?  No   Diabetic Exams:  Diabetic Eye Exam: Completed 07/09/2022 Diabetic Foot Exam: Overdue, Pt has been advised about the importance in completing this exam. Pt is scheduled for diabetic foot exam on at next diabetic appointment .     Interpreter Needed?:  No  Information entered by :: Laurel Dimmer, CMA   Activities of Daily Living    09/05/2022   10:17 AM  In your present state of health, do you have any difficulty performing the following activities:  Hearing? 1  Vision? 1  Comment [facility]  Difficulty concentrating or making decisions? 0  Walking or climbing stairs? 0  Dressing or bathing? 0  Doing  errands, shopping? 0    Patient Care Team: Eustaquio Boyden, MD as PCP - General (Family Medicine) Jake Bathe, MD as PCP - Cardiology (Cardiology) Dingeldein, Viviann Spare, MD as Referring Physician (Ophthalmology) Kathyrn Sheriff, [facility] as Pharmacist (Pharmacist)  Indicate any recent Medical Services you may have received from other than [facility] providers in the past year (date may be approximate).     Assessment:   This is a routine wellness examination for Barby.  Hearing/Vision screen Admit to some hearing loss that she associate with age. Denies any changes with her vision. [facility] Exam: [facility]   Dietary issues and exercise activities discussed: Current Exercise Habits: The patient does not participate in regular exercise at present, Exercise limited by: None identified   Goals Addressed   None    Depression Screen    09/05/2022   10:15 AM 06/25/2022    2:43 PM 09/17/2021    4:12 PM 08/21/2021    2:37 PM 10/30/2020    2:46 PM 06/18/2020   11:17 AM 05/29/2020    2:36 PM  PHQ 2/9 Scores  PHQ - 2 Score 2 1 2 6 4 1 2   PHQ- 9 Score  1 3 14 12 2 7     Fall Risk    09/05/2022   10:17 AM 09/17/2021    2:37 PM 06/18/2020   10:05 AM 02/22/2019   10:20 AM 02/16/2018    9:27 AM  Fall Risk   Falls in the past year? 0 0 1 0 1  Comment     fell after tripping over broken concrete on sidewalk; loss of consciousness and head trauma  Number falls in past yr: 0  1  0  Injury with Fall? 0  0  1  Risk for fall due to : No Fall Risks      Follow up Falls evaluation completed        FALL RISK PREVENTION PERTAINING TO THE HOME:  Any stairs in or around the home? Yes  If so, are there any without handrails? No  Home free of loose throw rugs in walkways, pet beds, electrical cords, etc? Yes  Adequate lighting in your home to reduce risk of falls? Yes   ASSISTIVE DEVICES UTILIZED TO PREVENT FALLS:  Life alert? Yes  Use of a cane, walker or w/c? No  Grab bars in the  bathroom? No  Shower chair or bench in shower? Yes  Elevated toilet seat or a handicapped toilet? Yes   TIMED UP AND GO:  Was the test performed? Unable to perform, virtual appointment   Cognitive Function:    02/16/2018    9:49 AM 02/10/2017    8:59 AM 02/04/2016    9:00 AM  MMSE - Mini Mental State Exam  Orientation to time 5 5 5   Orientation to Place 5 5 5   Registration 3 3 3   Attention/ Calculation 0 0 0  Recall 3 3 3   Language- name 2 objects 0 0 0  Language- repeat 1 1 1   Language- follow 3 step command 3 3 3  Language- read & follow direction 0 0 0  Write a sentence 0 0 0  Copy design 0 0 0  Total score 20 20 20         09/05/2022   10:21 AM  6CIT Screen  What Year? 0 points  What month? 0 points  What time? 0 points  Count back from 20 0 points  Months in reverse 0 points  Repeat phrase 0 points  Total Score 0 points    Immunizations Immunization History  Administered Date(s) Administered   Fluad Quad(high Dose 65+) 12/28/2018, 02/26/2022   Influenza, High Dose Seasonal PF 12/18/2017, 12/23/2018, 01/23/2020   Influenza,inj,Quad PF,6+ Mos 01/22/2015, 02/04/2016, 12/19/2016   Influenza-Unspecified 04/01/2011   Moderna SARS-COV2 Booster Vaccination 02/24/2020, 08/30/2020   Moderna Sars-Covid-2 Vaccination 04/29/2019, 05/27/2019   PPD Test 11/30/2020   Pfizer Covid-19 Vaccine Bivalent Booster 69yrs & up 01/03/2021   Pneumococcal Conjugate-13 02/04/2016   Pneumococcal-Unspecified 03/24/2007   Td 03/24/2007, 09/05/2016   Tdap 09/09/2017, 01/25/2018, 09/12/2019   Zoster Recombinat (Shingrix) 02/20/2017, 05/02/2017   Zoster, Live 04/14/2009    TDAP status: Up to date  Flu Vaccine status: Up to date  Pneumococcal vaccine status: Due, Education has been provided regarding the importance of this vaccine. Advised may receive this vaccine at local pharmacy or Health Dept. Aware to provide a copy of the vaccination record if obtained from local pharmacy or  Health Dept. Verbalized acceptance and understanding.  Covid-19 vaccine status: Information provided on how to obtain vaccines.   Qualifies for Shingles Vaccine? Yes   Zostavax completed Yes   Shingrix Completed?: Yes  Screening Tests Health Maintenance  Topic Date Due   Pneumonia Vaccine 67+ Years old (2 of 2 - PPSV23 or PCV20) 02/03/2017   Diabetic kidney evaluation - Urine ACR  06/18/2021   COVID-19 Vaccine (6 - 2023-24 season) 12/13/2021   FOOT EXAM  06/19/2022   MAMMOGRAM  09/05/2023 (Originally 06/23/2020)   INFLUENZA VACCINE  11/13/2022   HEMOGLOBIN A1C  12/26/2022   OPHTHALMOLOGY EXAM  07/09/2023   Diabetic kidney evaluation - eGFR measurement  09/03/2023   Medicare Annual Wellness (AWV)  09/05/2023   DTaP/Tdap/Td (6 - Td or Tdap) 09/11/2029   DEXA SCAN  Completed   Zoster Vaccines- Shingrix  Completed   HPV VACCINES  Aged Out   Hepatitis C Screening  Discontinued    Health Maintenance  Health Maintenance Due  Topic Date Due   Pneumonia Vaccine 75+ Years old (2 of 2 - PPSV23 or PCV20) 02/03/2017   Diabetic kidney evaluation - Urine ACR  06/18/2021   COVID-19 Vaccine (6 - 2023-24 season) 12/13/2021   FOOT EXAM  06/19/2022    Colorectal cancer screening: No longer required.   Mammogram status: No longer required due to age.  DEXA Scan: 06/24/2019  Lung Cancer Screening: (Low Dose CT Chest recommended if Age 24-80 years, 30 pack-year currently smoking OR have quit w/in 15years.) does not qualify.   Lung Cancer Screening Referral: not applicable   Additional Screening:  Hepatitis C Screening: does not qualify;   Vision Screening: Recommended annual ophthalmology exams for early detection of glaucoma and other disorders of the eye. Is the patient up to date with their annual eye exam?  No  Who is the provider or what is the name of the office in which the patient attends annual eye exams? [facility]  If pt is not established with a provider, would  they like to be referred to a provider to establish care?  No .   Dental Screening: Recommended annual dental exams for proper oral hygiene  Community Resource Referral / Chronic Care Management: CRR required this visit?  No   CCM required this visit?  No      Plan:     I have personally reviewed and noted the following in the patient's chart:   Medical and social history Use of alcohol, tobacco or illicit drugs  Current medications and supplements including opioid prescriptions. Patient is not currently taking opioid prescriptions. Functional ability and status Nutritional status Physical activity Advanced directives List of other physicians Hospitalizations, surgeries, and ER visits in previous 12 months Vitals Screenings to include cognitive, depression, and falls Referrals and appointments  In addition, I have reviewed and discussed with patient certain preventive protocols, quality metrics, and best practice recommendations. A written personalized care plan for preventive services as well as general preventive health recommendations were provided to patient.     Ms. Jetty , Thank you for taking time to come for your Medicare Wellness Visit. I appreciate your ongoing commitment to your health goals. Please review the following plan we discussed and let me know if I can assist you in the future.   These are the goals we discussed:  Goals   None     This is a list of the screening recommended for you and due dates:  Health Maintenance  Topic Date Due   Pneumonia Vaccine (2 of 2 - PPSV23 or PCV20) 02/03/2017   Yearly kidney health urinalysis for diabetes  06/18/2021   COVID-19 Vaccine (6 - 2023-24 season) 12/13/2021   Complete foot exam   06/19/2022   Mammogram  09/05/2023*   Flu Shot  11/13/2022   Hemoglobin A1C  12/26/2022   Eye exam for diabetics  07/09/2023   Yearly kidney function blood test for diabetes  09/03/2023   Medicare Annual Wellness Visit   09/05/2023   DTaP/Tdap/Td vaccine (6 - Td or Tdap) 09/11/2029   DEXA scan (bone density measurement)  Completed   Zoster (Shingles) Vaccine  Completed   HPV Vaccine  Aged Out   Hepatitis C Screening  Discontinued  *Topic was postponed. The date shown is not the original due date.    Ms. Levell , Thank you for taking time to come for your Medicare Wellness Visit. I appreciate your ongoing commitment to your health goals. Please review the following plan we discussed and let me know if I can assist you in the future.   These are the goals we discussed:  Goals   None     This is a list of the screening recommended for you and due dates:  Health Maintenance  Topic Date Due   Pneumonia Vaccine (2 of 2 - PPSV23 or PCV20) 02/03/2017   Yearly kidney health urinalysis for diabetes  06/18/2021   COVID-19 Vaccine (6 - 2023-24 season) 12/13/2021   Complete foot exam   06/19/2022   Mammogram  09/05/2023*   Flu Shot  11/13/2022   Hemoglobin A1C  12/26/2022   Eye exam for diabetics  07/09/2023   Yearly kidney function blood test for diabetes  09/03/2023   Medicare Annual Wellness Visit  09/05/2023   DTaP/Tdap/Td vaccine (6 - Td or Tdap) 09/11/2029   DEXA scan (bone density measurement)  Completed   Zoster (Shingles) Vaccine  Completed   HPV Vaccine  Aged Out   Hepatitis C Screening  Discontinued  *Topic was postponed. The date shown is not the original due date.  Lonna Cobb, CMA   09/05/2022  Nurse Notes: Approximately 30 minute Non-Face -To-Face Medicare Wellness Visit

## 2022-09-04 NOTE — Patient Instructions (Signed)

## 2022-09-05 ENCOUNTER — Ambulatory Visit (INDEPENDENT_AMBULATORY_CARE_PROVIDER_SITE_OTHER): Payer: PPO

## 2022-09-05 DIAGNOSIS — Z0000 Encounter for general adult medical examination without abnormal findings: Secondary | ICD-10-CM

## 2022-09-09 ENCOUNTER — Other Ambulatory Visit: Payer: Self-pay | Admitting: *Deleted

## 2022-09-09 DIAGNOSIS — R748 Abnormal levels of other serum enzymes: Secondary | ICD-10-CM

## 2022-09-12 ENCOUNTER — Other Ambulatory Visit: Payer: PPO

## 2022-09-13 ENCOUNTER — Other Ambulatory Visit: Payer: Self-pay | Admitting: Family Medicine

## 2022-09-13 DIAGNOSIS — M810 Age-related osteoporosis without current pathological fracture: Secondary | ICD-10-CM

## 2022-09-13 DIAGNOSIS — E1169 Type 2 diabetes mellitus with other specified complication: Secondary | ICD-10-CM

## 2022-09-13 DIAGNOSIS — E118 Type 2 diabetes mellitus with unspecified complications: Secondary | ICD-10-CM

## 2022-09-13 DIAGNOSIS — E039 Hypothyroidism, unspecified: Secondary | ICD-10-CM

## 2022-09-13 DIAGNOSIS — E1122 Type 2 diabetes mellitus with diabetic chronic kidney disease: Secondary | ICD-10-CM

## 2022-09-13 DIAGNOSIS — M3313 Other dermatomyositis without myopathy: Secondary | ICD-10-CM

## 2022-09-13 DIAGNOSIS — E538 Deficiency of other specified B group vitamins: Secondary | ICD-10-CM

## 2022-09-13 DIAGNOSIS — R768 Other specified abnormal immunological findings in serum: Secondary | ICD-10-CM

## 2022-09-15 ENCOUNTER — Other Ambulatory Visit (INDEPENDENT_AMBULATORY_CARE_PROVIDER_SITE_OTHER): Payer: PPO

## 2022-09-15 DIAGNOSIS — M810 Age-related osteoporosis without current pathological fracture: Secondary | ICD-10-CM

## 2022-09-15 DIAGNOSIS — N183 Chronic kidney disease, stage 3 unspecified: Secondary | ICD-10-CM

## 2022-09-15 DIAGNOSIS — E1122 Type 2 diabetes mellitus with diabetic chronic kidney disease: Secondary | ICD-10-CM | POA: Diagnosis not present

## 2022-09-15 DIAGNOSIS — E039 Hypothyroidism, unspecified: Secondary | ICD-10-CM | POA: Diagnosis not present

## 2022-09-15 DIAGNOSIS — E538 Deficiency of other specified B group vitamins: Secondary | ICD-10-CM

## 2022-09-15 DIAGNOSIS — E118 Type 2 diabetes mellitus with unspecified complications: Secondary | ICD-10-CM

## 2022-09-15 DIAGNOSIS — R768 Other specified abnormal immunological findings in serum: Secondary | ICD-10-CM | POA: Diagnosis not present

## 2022-09-15 DIAGNOSIS — E1169 Type 2 diabetes mellitus with other specified complication: Secondary | ICD-10-CM | POA: Diagnosis not present

## 2022-09-15 DIAGNOSIS — M3313 Other dermatomyositis without myopathy: Secondary | ICD-10-CM

## 2022-09-15 DIAGNOSIS — E785 Hyperlipidemia, unspecified: Secondary | ICD-10-CM

## 2022-09-15 LAB — C-REACTIVE PROTEIN: CRP: <1 mg/dL (ref 0.5–20.0)

## 2022-09-15 LAB — COMPREHENSIVE METABOLIC PANEL
ALT: 18 U/L (ref 0–35)
AST: 16 U/L (ref 0–37)
Albumin: 4.3 g/dL (ref 3.5–5.2)
Alkaline Phosphatase: 61 U/L (ref 39–117)
BUN: 23 mg/dL (ref 6–23)
CO2: 25 mEq/L (ref 19–32)
Calcium: 9.6 mg/dL (ref 8.4–10.5)
Chloride: 103 mEq/L (ref 96–112)
Creatinine, Ser: 1.59 mg/dL — ABNORMAL HIGH (ref 0.40–1.20)
GFR: 30.45 mL/min — ABNORMAL LOW (ref >60.00)
Glucose, Bld: 160 mg/dL — ABNORMAL HIGH (ref 70–99)
Potassium: 4.3 mEq/L (ref 3.5–5.1)
Sodium: 138 mEq/L (ref 135–145)
Total Bilirubin: 0.5 mg/dL (ref 0.2–1.2)
Total Protein: 6.9 g/dL (ref 6.0–8.3)

## 2022-09-15 LAB — CBC WITH DIFFERENTIAL/PLATELET
Basophils Absolute: 0.1 10*3/uL (ref 0.0–0.1)
Basophils Relative: 1.3 % (ref 0.0–3.0)
Eosinophils Absolute: 0.3 10*3/uL (ref 0.0–0.7)
Eosinophils Relative: 3.9 % (ref 0.0–5.0)
HCT: 36.2 % (ref 36.0–46.0)
Hemoglobin: 11.8 g/dL — ABNORMAL LOW (ref 12.0–15.0)
Lymphocytes Relative: 26.3 % (ref 12.0–46.0)
Lymphs Abs: 1.9 10*3/uL (ref 0.7–4.0)
MCHC: 32.6 g/dL (ref 30.0–36.0)
MCV: 88 fl (ref 78.0–100.0)
Monocytes Absolute: 0.5 10*3/uL (ref 0.1–1.0)
Monocytes Relative: 6.6 % (ref 3.0–12.0)
Neutro Abs: 4.5 10*3/uL (ref 1.4–7.7)
Neutrophils Relative %: 61.9 % (ref 43.0–77.0)
Platelets: 297 10*3/uL (ref 150.0–400.0)
RBC: 4.11 Mil/uL (ref 3.87–5.11)
RDW: 13.8 % (ref 11.5–15.5)
WBC: 7.2 10*3/uL (ref 4.0–10.5)

## 2022-09-15 LAB — LIPID PANEL
Cholesterol: 151 mg/dL (ref 0–200)
HDL: 46.5 mg/dL (ref >39.00)
LDL Cholesterol: 69 mg/dL (ref 0–99)
NonHDL: 104.36
Total CHOL/HDL Ratio: 3
Triglycerides: 176 mg/dL — ABNORMAL HIGH (ref 0.0–149.0)
VLDL: 35.2 mg/dL (ref 0.0–40.0)

## 2022-09-15 LAB — TSH: TSH: 1.04 u[IU]/mL (ref 0.35–5.50)

## 2022-09-15 LAB — VITAMIN B12: Vitamin B-12: >1500 pg/mL — ABNORMAL HIGH (ref 211–911)

## 2022-09-15 LAB — VITAMIN D 25 HYDROXY (VIT D DEFICIENCY, FRACTURES): VITD: 34.8 ng/mL (ref 30.00–100.00)

## 2022-09-15 LAB — CK: Total CK: 27 U/L (ref 7–177)

## 2022-09-15 LAB — SEDIMENTATION RATE: Sed Rate: 22 mm/hr (ref 0–30)

## 2022-09-15 LAB — PHOSPHORUS: Phosphorus: 4.1 mg/dL (ref 2.3–4.6)

## 2022-09-15 LAB — HEMOGLOBIN A1C: Hgb A1c MFr Bld: 7.3 % — ABNORMAL HIGH (ref 4.6–6.5)

## 2022-09-15 NOTE — Addendum Note (Signed)
Addended by: Alvina Chou on: 09/15/2022 03:18 PM   Modules accepted: Orders

## 2022-09-16 LAB — PROTEIN ELECTROPHORESIS, SERUM, WITH REFLEX: Total Protein: 6.5 g/dL (ref 6.1–8.1)

## 2022-09-16 LAB — PARATHYROID HORMONE, INTACT (NO CA): PTH: 49 pg/mL (ref 16–77)

## 2022-09-17 LAB — ANTI-NUCLEAR AB-TITER (ANA TITER): ANA Titer 1: 1:1280 {titer} — ABNORMAL HIGH

## 2022-09-18 LAB — PROTEIN ELECTROPHORESIS, SERUM, WITH REFLEX
Albumin ELP: 3.9 g/dL (ref 3.8–4.8)
Alpha 1: 0.3 g/dL (ref 0.2–0.3)
Alpha 2: 0.8 g/dL (ref 0.5–0.9)
Beta 2: 0.3 g/dL (ref 0.2–0.5)
Beta Globulin: 0.4 g/dL (ref 0.4–0.6)
Gamma Globulin: 0.8 g/dL (ref 0.8–1.7)

## 2022-09-18 LAB — ANTI-DNA ANTIBODY, DOUBLE-STRANDED: ds DNA Ab: <1 IU/mL

## 2022-09-18 LAB — ANA: Anti Nuclear Antibody (ANA): POSITIVE — AB

## 2022-09-19 ENCOUNTER — Telehealth: Payer: Self-pay | Admitting: Family Medicine

## 2022-09-19 ENCOUNTER — Ambulatory Visit (INDEPENDENT_AMBULATORY_CARE_PROVIDER_SITE_OTHER): Payer: PPO | Admitting: Family Medicine

## 2022-09-19 ENCOUNTER — Encounter: Payer: Self-pay | Admitting: Family Medicine

## 2022-09-19 VITALS — BP 128/66 | HR 81 | Temp 97.4°F | Ht 62.5 in | Wt 133.2 lb

## 2022-09-19 DIAGNOSIS — Z0000 Encounter for general adult medical examination without abnormal findings: Secondary | ICD-10-CM | POA: Diagnosis not present

## 2022-09-19 DIAGNOSIS — E1169 Type 2 diabetes mellitus with other specified complication: Secondary | ICD-10-CM

## 2022-09-19 DIAGNOSIS — E118 Type 2 diabetes mellitus with unspecified complications: Secondary | ICD-10-CM

## 2022-09-19 DIAGNOSIS — E1122 Type 2 diabetes mellitus with diabetic chronic kidney disease: Secondary | ICD-10-CM | POA: Diagnosis not present

## 2022-09-19 DIAGNOSIS — M810 Age-related osteoporosis without current pathological fracture: Secondary | ICD-10-CM

## 2022-09-19 DIAGNOSIS — Z66 Do not resuscitate: Secondary | ICD-10-CM

## 2022-09-19 DIAGNOSIS — R413 Other amnesia: Secondary | ICD-10-CM

## 2022-09-19 DIAGNOSIS — I10 Essential (primary) hypertension: Secondary | ICD-10-CM

## 2022-09-19 DIAGNOSIS — F332 Major depressive disorder, recurrent severe without psychotic features: Secondary | ICD-10-CM | POA: Diagnosis not present

## 2022-09-19 DIAGNOSIS — E538 Deficiency of other specified B group vitamins: Secondary | ICD-10-CM | POA: Diagnosis not present

## 2022-09-19 DIAGNOSIS — I2510 Atherosclerotic heart disease of native coronary artery without angina pectoris: Secondary | ICD-10-CM

## 2022-09-19 DIAGNOSIS — E039 Hypothyroidism, unspecified: Secondary | ICD-10-CM | POA: Diagnosis not present

## 2022-09-19 DIAGNOSIS — Z96641 Presence of right artificial hip joint: Secondary | ICD-10-CM

## 2022-09-19 DIAGNOSIS — I5022 Chronic systolic (congestive) heart failure: Secondary | ICD-10-CM

## 2022-09-19 DIAGNOSIS — R768 Other specified abnormal immunological findings in serum: Secondary | ICD-10-CM

## 2022-09-19 DIAGNOSIS — Z7189 Other specified counseling: Secondary | ICD-10-CM

## 2022-09-19 DIAGNOSIS — I6523 Occlusion and stenosis of bilateral carotid arteries: Secondary | ICD-10-CM

## 2022-09-19 DIAGNOSIS — N183 Chronic kidney disease, stage 3 unspecified: Secondary | ICD-10-CM | POA: Diagnosis not present

## 2022-09-19 DIAGNOSIS — M154 Erosive (osteo)arthritis: Secondary | ICD-10-CM

## 2022-09-19 DIAGNOSIS — M3313 Other dermatomyositis without myopathy: Secondary | ICD-10-CM | POA: Diagnosis not present

## 2022-09-19 DIAGNOSIS — E785 Hyperlipidemia, unspecified: Secondary | ICD-10-CM

## 2022-09-19 LAB — MICROALBUMIN / CREATININE URINE RATIO
Creatinine,U: 79.7 mg/dL
Microalb Creat Ratio: 0.9 mg/g (ref 0.0–30.0)
Microalb, Ur: <0.7 mg/dL (ref 0.0–1.9)

## 2022-09-19 MED ORDER — VENLAFAXINE HCL ER 75 MG PO CP24: ORAL_CAPSULE | ORAL | 4 refills | Status: DC

## 2022-09-19 MED ORDER — VITAMIN B-12 1000 MCG PO TABS: 1000.0000 ug | ORAL_TABLET | ORAL | Status: AC

## 2022-09-19 NOTE — Progress Notes (Unsigned)
Ph: [phone] Fax: [phone]   Patient ID: Kathleen Williamson, female    DOB: Dec 20, 1940, 82 y.o.   MRN: [id number]  This visit was conducted in person.  BP 128/66   Pulse 81   Temp (!) 97.4 F (36.3 C) (Temporal)   Ht 5' 2.5" (1.588 m)   Wt 133 lb 4 oz (60.4 kg)   LMP  (LMP Unknown)   SpO2 99%   BMI 23.98 kg/m    CC: CPE Subjective:   HPI: Kathleen Williamson is a 82 y.o. female presenting on 09/19/2022 for Annual Exam (MCR prt 2 [AWV- 09/05/22].)   Saw health advisor 08/2022 for medicare wellness visit. Note reviewed.   Hearing Screening   500Hz  1000Hz  2000Hz  4000Hz   Right ear 20 20 20  0  Left ear 40 40 40 40  Vision Screening - Comments:: Last eye exam, 04/2022.  Flowsheet Row Clinical Support from 09/05/2022 in [facility]  PHQ-2 Total Score 2          09/05/2022   10:17 AM 09/17/2021    2:37 PM 06/18/2020   10:05 AM 02/22/2019   10:20 AM 02/16/2018    9:27 AM  Fall Risk   Falls in the past year? 0 0 1 0 1  Comment     fell after tripping over broken concrete on sidewalk; loss of consciousness and head trauma  Number falls in past yr: 0  1  0  Injury with Fall? 0  0  1  Risk for fall due to : No Fall Risks      Follow up Falls evaluation completed        After dinner last night had nausea, diarrhea (ate seafood at [facility]). Treated with imodium with benefit.   Seeing cardiology for CAD s/p 5v CABG (2003), carotid disease, LBBB and cardiomyopathy with sCHF and reduced EF to 30-35%.   Longstanding MDD on effexor XR daily with remeron 15mg  nightly. Off klonopin. She last saw counselor Inetta Fermo at [facility] - declines return. Has not seen psychiatry.   She stopped farxiga due to concern over recurrent yeast infection.   Bilateral hand pain - xrays showed erosive osteoarthritis. Has seen Dr Stephenie Acres in [place].  Has personal h/o dermatomyositis. Notes chronic hand pain. Denies heliotrope rash, myalgia, proximal muscle  weakness. No dysphagia or dyspnea.  Fmhx RA (mother)  She started seeing accupuncturist x4 in [place] with benefit - improvement in numbness and swelling. She also continues voltaren gel nightly.  Cancelled rheumatology evaluation given stability in symptoms.    Preventative: Colonoscopy 05/23/2014 microscopic colitis attributed to zoloft (Dr Laural Benes at [place]). Aged out. Denies blood in stool or bowel changes Breast cancer screening - 06/2019 [facility]) - self breast exams at home without concerns. Declines repeat.  Well woman with pap 03/2014 ([place]) - will not return. S/p complete hysterectomy with B oophorectomy for endometriosis.  Lung cancer screening - never smoker  Osteoporosis:  DEXA scan 01/2015 -2.9 hip, -2.2 spine. She does take calcium and vitamin D regularly.  Dexa 2019 - T score -2.6 (improvement). declines bisphosphonate. Prolia started 08/2017.  DEXA 06/2019: T score -2.7 L hip, -2.1 spine.  Last prolia shot 12/2021. Declined due to cost - would consider restarting at this time.  Flu shot yearly  COVID vaccine Moderna 04/2019, 05/2019, booster 02/2020, 08/2020, Pfizer bivalent 12/2020 Td - 2008, Tdap 2019  Pneumovax 2008, prevnar-13 2017 zostavax - 2011 shingrix - 02/2017, 04/2017 Advanced directive  discussion - has completed through attorney. Asked to bring [name] a copy. Very difficult to complete since husband's passing. HCPOA would be SIL Kathie Rhodes or friend Kendal Hymen. DNR wishes confirmed - form filled out today.  Seat belt use discussed  Sunscreen use discussed, no changing moles on skin  Non smoker Alcohol - none Dentist q6 mo  Eye exam yearly  Bowel - no constipation  Bladder - mild urge incontinence, manageable    Lives alone at [facility]. Husband of 47 yrs died suddenly 26-Oct-2013.  No children, no siblings.   Close friends Darel Hong and Loann Quill nearby, cousin in [place]. Activity: no regular exercise  Diet: poor - lots of frozen dinners, eating out      Relevant past medical,  surgical, family and social history reviewed and updated as indicated. Interim medical history since our last visit reviewed. Allergies and medications reviewed and updated. Outpatient Medications Prior to Visit  Medication Sig Dispense Refill   Accu-Chek FastClix Lancets MISC USE AS INSTRUCTED TO CHECK BLOOD SUGAR ONCE DAILY 102 each 1   acetaminophen (TYLENOL) 325 MG tablet Take 650 mg by mouth every 6 (six) hours as needed for moderate pain or headache.     amLODipine (NORVASC) 2.5 MG tablet Take 2.5 mg by mouth daily.     aspirin EC 81 MG tablet Take 81 mg by mouth daily. Swallow whole.     atorvastatin (LIPITOR) 40 MG tablet Take 1 tablet (40 mg total) by mouth daily. 30 tablet 3   Blood Glucose Monitoring Suppl (ACCU-CHEK GUIDE) w/Device KIT 1 each by Does not apply route as directed. Use as instructed to check blood sugar once daily 1 kit 0   Cholecalciferol (VITAMIN D3) 25 MCG (1000 UT) CAPS Take 2 capsules (2,000 Units total) by mouth daily. 30 capsule    Coenzyme Q10 100 MG capsule Take 100 mg by mouth daily.     denosumab (PROLIA) 60 MG/ML SOSY injection Inject 60 mg into the skin every 6 (six) months.     ezetimibe (ZETIA) 10 MG tablet Take 1 tablet (10 mg total) by mouth daily. 90 tablet 3   glucose blood (ONETOUCH VERIO) test strip Use as instructed to check blood sugar once a day 100 each 3   levothyroxine (SYNTHROID) 50 MCG tablet TAKE ONE TABLET BY MOUTH ONCE DAILY 90 tablet 1   lisinopril (ZESTRIL) 10 MG tablet Take 1 tablet (10 mg total) by mouth daily. 90 tablet 3   loperamide (IMODIUM A-D) 2 MG tablet Take 2 mg by mouth 4 (four) times daily as needed for diarrhea or loose stools.     mirtazapine (REMERON) 15 MG tablet TAKE ONE TABLET BY MOUTH EVERYDAY AT BEDTIME 30 tablet 6   nitroGLYCERIN (NITROLINGUAL) 0.4 MG/SPRAY spray Place 1 spray under the tongue every 5 (five) minutes as needed. 12 g prn   citalopram (CELEXA) 20 MG tablet Take 20 mg by mouth daily.     clonazePAM  (KLONOPIN) 0.5 MG tablet Take 0.5 mg by mouth daily.     venlafaxine XR (EFFEXOR-XR) 75 MG 24 hr capsule TAKE THREE CAPSULES BY MOUTH EVERY MORNING 270 capsule 2   vitamin B-12 (CYANOCOBALAMIN) 1000 MCG tablet Take 1 tablet (1,000 mcg total) by mouth daily.     No facility-administered medications prior to visit.     Per HPI unless specifically indicated in ROS section below Review of Systems  Constitutional:  Negative for activity change, appetite change, chills, fatigue, fever and unexpected weight change.  HENT:  Negative for hearing loss.   Eyes:  Negative for visual disturbance.  Respiratory:  Negative for cough, chest tightness, shortness of breath and wheezing.   Cardiovascular:  Negative for chest pain, palpitations and leg swelling.  Gastrointestinal:  Positive for diarrhea (last night) and nausea. Negative for abdominal distention, abdominal pain, blood in stool, constipation and vomiting.  Genitourinary:  Negative for difficulty urinating and hematuria.  Musculoskeletal:  Negative for arthralgias, myalgias and neck pain.  Skin:  Negative for rash.  Neurological:  Negative for dizziness, seizures, syncope and headaches.  Hematological:  Negative for adenopathy. Does not bruise/bleed easily.  Psychiatric/Behavioral:  Negative for dysphoric mood. The patient is not nervous/anxious.     Objective:  BP 128/66   Pulse 81   Temp (!) 97.4 F (36.3 C) (Temporal)   Ht 5' 2.5" (1.588 m)   Wt 133 lb 4 oz (60.4 kg)   LMP  (LMP Unknown)   SpO2 99%   BMI 23.98 kg/m   Wt Readings from Last 3 Encounters:  09/19/22 133 lb 4 oz (60.4 kg)  08/14/22 134 lb 12.8 oz (61.1 kg)  06/25/22 136 lb 2 oz (61.7 kg)      Physical Exam Vitals and nursing note reviewed.  Constitutional:      Appearance: Normal appearance. She is not ill-appearing.  HENT:     Head: Normocephalic and atraumatic.     Right Ear: Tympanic membrane, ear canal and external ear normal. There is no impacted cerumen.      Left Ear: Tympanic membrane, ear canal and external ear normal. There is no impacted cerumen.     Nose: Nose normal.     Mouth/Throat:     Mouth: Mucous membranes are moist.     Pharynx: Oropharynx is clear. No oropharyngeal exudate or posterior oropharyngeal erythema.  Eyes:     General:        Right eye: No discharge.        Left eye: No discharge.     Extraocular Movements: Extraocular movements intact.     Conjunctiva/sclera: Conjunctivae normal.     Pupils: Pupils are equal, round, and reactive to light.  Neck:     Thyroid: No thyroid mass or thyromegaly.     Vascular: Carotid bruit (R) present.  Cardiovascular:     Rate and Rhythm: Normal rate and regular rhythm.     Pulses: Normal pulses.     Heart sounds: Normal heart sounds. No murmur heard. Pulmonary:     Effort: Pulmonary effort is normal. No respiratory distress.     Breath sounds: Normal breath sounds. No wheezing, rhonchi or rales.  Abdominal:     General: Bowel sounds are normal. There is no distension.     Palpations: Abdomen is soft. There is no mass.     Tenderness: There is no abdominal tenderness. There is no guarding or rebound.     Hernia: No hernia is present.  Musculoskeletal:     Cervical back: Normal range of motion and neck supple. No rigidity.     Right lower leg: No edema.     Left lower leg: No edema.  Lymphadenopathy:     Cervical: No cervical adenopathy.  Skin:    General: Skin is warm and dry.     Findings: No rash.  Neurological:     General: No focal deficit present.     Mental Status: She is alert. Mental status is at baseline.  Psychiatric:        Mood  and Affect: Mood normal.        Behavior: Behavior normal.       Results for orders placed or performed in visit on 09/15/22  Hemoglobin A1c  Result Value Ref Range   Hgb A1c MFr Bld 7.3 (H) 4.6 - 6.5 %  Anti-DNA antibody, double-stranded  Result Value Ref Range   ds DNA Ab <1 IU/mL  Serum protein electrophoresis with reflex   Result Value Ref Range   Total Protein 6.5 6.1 - 8.1 g/dL   Albumin ELP 3.9 3.8 - 4.8 g/dL   Alpha 1 0.3 0.2 - 0.3 g/dL   Alpha 2 0.8 0.5 - 0.9 g/dL   Beta Globulin 0.4 0.4 - 0.6 g/dL   Beta 2 0.3 0.2 - 0.5 g/dL   Gamma Globulin 0.8 0.8 - 1.7 g/dL   SPE Interp.    C-reactive protein  Result Value Ref Range   CRP <1.0 0.5 - 20.0 mg/dL  Sedimentation rate  Result Value Ref Range   Sed Rate 22 0 - 30 mm/hr  ANA  Result Value Ref Range   Anti Nuclear Antibody (ANA) POSITIVE (A) NEGATIVE  Vitamin B12  Result Value Ref Range   Vitamin B-12 >1500 (H) 211 - 911 pg/mL  CBC with Differential/Platelet  Result Value Ref Range   WBC 7.2 4.0 - 10.5 K/uL   RBC 4.11 3.87 - 5.11 Mil/uL   Hemoglobin 11.8 (L) 12.0 - 15.0 g/dL   HCT 54.0 98.1 - 19.1 %   MCV 88.0 78.0 - 100.0 fl   MCHC 32.6 30.0 - 36.0 g/dL   RDW 47.8 29.5 - 62.1 %   Platelets 297.0 150.0 - 400.0 K/uL   Neutrophils Relative % 61.9 43.0 - 77.0 %   Lymphocytes Relative 26.3 12.0 - 46.0 %   Monocytes Relative 6.6 3.0 - 12.0 %   Eosinophils Relative 3.9 0.0 - 5.0 %   Basophils Relative 1.3 0.0 - 3.0 %   Neutro Abs 4.5 1.4 - 7.7 K/uL   Lymphs Abs 1.9 0.7 - 4.0 K/uL   Monocytes Absolute 0.5 0.1 - 1.0 K/uL   Eosinophils Absolute 0.3 0.0 - 0.7 K/uL   Basophils Absolute 0.1 0.0 - 0.1 K/uL  Parathyroid hormone, intact (no Ca)  Result Value Ref Range   PTH 49 16 - 77 pg/mL  Phosphorus  Result Value Ref Range   Phosphorus 4.1 2.3 - 4.6 mg/dL  CK  Result Value Ref Range   Total CK 27 7 - 177 U/L  TSH  Result Value Ref Range   TSH 1.04 0.35 - 5.50 uIU/mL  VITAMIN D 25 Hydroxy (Vit-D Deficiency, Fractures)  Result Value Ref Range   VITD 34.80 30.00 - 100.00 ng/mL  Comprehensive metabolic panel  Result Value Ref Range   Sodium 138 135 - 145 mEq/L   Potassium 4.3 3.5 - 5.1 mEq/L   Chloride 103 96 - 112 mEq/L   CO2 25 19 - 32 mEq/L   Glucose, Bld 160 (H) 70 - 99 mg/dL   BUN 23 6 - 23 mg/dL   Creatinine, Ser 3.08 (H) 0.40 -  1.20 mg/dL   Total Bilirubin 0.5 0.2 - 1.2 mg/dL   Alkaline Phosphatase 61 39 - 117 U/L   AST 16 0 - 37 U/L   ALT 18 0 - 35 U/L   Total Protein 6.9 6.0 - 8.3 g/dL   Albumin 4.3 3.5 - 5.2 g/dL   GFR 65.78 (L) >46.96 mL/min   Calcium 9.6 8.4 - 10.5  mg/dL  Lipid panel  Result Value Ref Range   Cholesterol 151 0 - 200 mg/dL   Triglycerides 161.0 (H) 0.0 - 149.0 mg/dL   HDL 96.04 >54.09 mg/dL   VLDL 81.1 0.0 - 91.4 mg/dL   LDL Cholesterol 69 0 - 99 mg/dL   Total CHOL/HDL Ratio 3    NonHDL 104.36   Anti-nuclear ab-titer (ANA titer)  Result Value Ref Range   ANA Titer 1 1:1,280 (H) titer   ANA Pattern 1 Nuclear, Fine Speckled (A)   Microalbumin / creatinine urine ratio  Result Value Ref Range   Microalb, Ur <0.7 0.0 - 1.9 mg/dL   Creatinine,U 78.2 mg/dL   Microalb Creat Ratio 0.9 0.0 - 30.0 mg/g    Assessment & Plan:   Problem List Items Addressed This Visit     Advanced care planning/counseling discussion (Chronic)    Advanced directive discussion - has completed through attorney. Asked to bring [name] a copy. Very difficult to complete since husband's passing. HCPOA would be SIL Kathie Rhodes or friend Kendal Hymen. Would want to be DNR.       Health maintenance examination - Primary (Chronic)    Preventative protocols reviewed and updated unless pt declined. Discussed healthy diet and lifestyle.       DNR (do not resuscitate) (Chronic)    Confirmed with patient. [place] form filled out today and provided to patient to have available.       Atherosclerosis of native coronary artery of native heart without angina pectoris    Continue aspirin statin and zetia. Sees cardiology s/p 5v CABG 2003      Hyperlipidemia associated with type 2 diabetes mellitus (HCC)    Chronic, stable period on atorvastatin and zetia. LDL goal at least <70 given personal h/o CAD/CABG.  .The ASCVD Risk score (Arnett DK, et al., 2019) failed to calculate for the following reasons:   The 2019 ASCVD risk score is  only valid for ages 15 to 79   The patient has a prior MI or stroke diagnosis       MDD (major depressive disorder), recurrent severe, without psychosis (HCC)    Chronic, stable period on Effexor and mirtazapine, off klonopin for over a year, off celexa same.       Relevant Medications   venlafaxine XR (EFFEXOR-XR) 75 MG 24 hr capsule   Osteoporosis    Known mild osteoporosis with latest DEXA T - 2.7 at spine. Overdue for rpt - # provided to call and schedule this. Overdue for Prolia - reviewed increased fracture risk when prolia is stopped without additional medicine on board. She had previously declined rpt Prolia due to cost however now agrees to restart - will re-price out for patient. She prefers to avoid bisphosphonates.  CrCl = 27, Ca 9.6. Would repeat BMP 1 wk after Prolia to ensure kidney function and calcium remains stable.       Hypothyroidism    Chronic, stable. Continue current regimen of levothyroxine daily.       Dermatomyositis [facility])    Last saw Dr Corliss Skains 2016, declined return last year.  In remission - no myalgias, proximal muscle weakness, rash. No signs of secondary organ involvement (no dyspnea, dysphagia). Inflammatory markers remain normal, CPK also normal. Will continue to monitor.  Anti-U1 RNP pending.       CKD stage 3 due to type 2 diabetes mellitus (HCC)    GFR stable at 30. Mild anemia noted. No microalbuminuria.  Continues low dose lisinopril, stopped [place] concerned  with possible side effects.  Aware to stay hydrated, avoid NSAIDs and other nephrotoxic agents.  Continue to monitor.  Recent SPEP normal       Carotid stenosis    Mild R carotid bruit noted, minimal BICA stenosis on latest carotid US 2022, now followed clinically by cardiology      Diabetes mellitus type 2, controlled, with complications (HCC)    Chronic diet controlled, with A1c above ideal range but adequate for age. Encouraged continued efforts to follow diabetic diet.   Would consider retrial of SGLT2i      Status post right hip replacement   Hypertension    Chronic, stable on amlodipine and lisinopril.       Memory impairment    No evidence of ongoing memory impairment.       Erosive osteoarthritis of both hands    Severe activity limiting pain and swelling to several DIPs.  She has received benefit from acupuncture and from steroid injections into wrists by ortho Stephenie Acres)  She has declined rheumatology eval.       Positive ANA (antinuclear antibody)   Low serum vitamin B12    B12 now high - stop replacement x 2 wks then restart once weekly replacement.       Chronic HFrEF (heart failure with reduced ejection fraction) (HCC)    Appreciate cardiology care. She is not on diuretic.         Meds ordered this encounter  Medications   venlafaxine XR (EFFEXOR-XR) 75 MG 24 hr capsule    Sig: TAKE THREE CAPSULES BY MOUTH EVERY MORNING    Dispense:  270 capsule    Refill:  4   cyanocobalamin (VITAMIN B12) 1000 MCG tablet    Sig: Take 1 tablet (1,000 mcg total) by mouth once a week.    No orders of the defined types were placed in this encounter.   Patient Instructions  DNR form provided today  Good to see you today.  We will recheck into Prolia.  Bring [name] copy of your advanced directive/living will.  Double check at home that you're not taking celexa (citalopram) and let us know. Hold B12 vitamin for 2 weeks then start once weekly.  Return in 3-4 months for diabetes follow up visit  Follow up plan: Return in about 3 months (around 12/20/2022) for follow up visit.  Eustaquio Boyden, MD

## 2022-09-19 NOTE — Assessment & Plan Note (Signed)
Preventative protocols reviewed and updated unless pt declined. Discussed healthy diet and lifestyle.  

## 2022-09-19 NOTE — Assessment & Plan Note (Signed)
[place] form filled out today

## 2022-09-19 NOTE — Telephone Encounter (Signed)
Pt requests resubmitting of Prolia to price out as she's willing to take now. Thanks.

## 2022-09-19 NOTE — Patient Instructions (Addendum)
DNR form provided today  Good to see you today.  We will recheck into Prolia.  Bring [name] copy of your advanced directive/living will.  Double check at home that you're not taking celexa (citalopram) and let us know. Hold B12 vitamin for 2 weeks then start once weekly.  Return in 3-4 months for diabetes follow up visit

## 2022-09-19 NOTE — Assessment & Plan Note (Addendum)
Advanced directive discussion - has completed through attorney. Asked to bring [name] a copy. Very difficult to complete since husband's passing. HCPOA would be SIL Kathie Rhodes or friend Kendal Hymen. Would want to be DNR.

## 2022-09-20 ENCOUNTER — Encounter: Payer: Self-pay | Admitting: Family Medicine

## 2022-09-20 NOTE — Assessment & Plan Note (Signed)
Chronic, stable period on Effexor and mirtazapine, off klonopin for over a year, off celexa same.

## 2022-09-20 NOTE — Assessment & Plan Note (Signed)
Last saw Dr Corliss Skains 2016, declined return last year.  In remission - no myalgias, proximal muscle weakness, rash. No signs of secondary organ involvement (no dyspnea, dysphagia). Inflammatory markers remain normal, CPK also normal. Will continue to monitor.  Anti-U1 RNP pending.

## 2022-09-20 NOTE — Assessment & Plan Note (Signed)
Chronic, stable. Continue current regimen of levothyroxine 50mcg daily.  

## 2022-09-20 NOTE — Assessment & Plan Note (Addendum)
GFR stable at 30. Mild anemia noted. No microalbuminuria.  Continues low dose lisinopril, stopped Marcelline Deist concerned with possible side effects.  Aware to stay hydrated, avoid NSAIDs and other nephrotoxic agents.  Continue to monitor.  Recent SPEP normal

## 2022-09-20 NOTE — Assessment & Plan Note (Addendum)
Chronic diet controlled, with A1c above ideal range but adequate for age. Encouraged continued efforts to follow diabetic diet.  Would consider retrial of SGLT2i

## 2022-09-20 NOTE — Assessment & Plan Note (Addendum)
Known mild osteoporosis with latest DEXA T - 2.7 at spine. Overdue for rpt - # provided to call and schedule this. Overdue for Prolia - reviewed increased fracture risk when prolia is stopped without additional medicine on board. She had previously declined rpt Prolia due to cost however now agrees to restart - will re-price out for patient. She prefers to avoid bisphosphonates.  CrCl = 27, Ca 9.6. Would repeat BMP 1 wk after Prolia to ensure kidney function and calcium remains stable.

## 2022-09-20 NOTE — Assessment & Plan Note (Signed)
No evidence of ongoing memory impairment.

## 2022-09-20 NOTE — Assessment & Plan Note (Signed)
Severe activity limiting pain and swelling to several DIPs.  She has received benefit from acupuncture and from steroid injections into wrists by ortho Stephenie Acres)  She has declined rheumatology eval.

## 2022-09-20 NOTE — Assessment & Plan Note (Signed)
Appreciate cardiology care. She is not on diuretic.

## 2022-09-20 NOTE — Assessment & Plan Note (Addendum)
Mild R carotid bruit noted, minimal BICA stenosis on latest carotid US 2022, now followed clinically by cardiology

## 2022-09-20 NOTE — Assessment & Plan Note (Addendum)
Chronic, stable period on atorvastatin and zetia. LDL goal at least <70 given personal h/o CAD/CABG.  .The ASCVD Risk score (Arnett DK, et al., 2019) failed to calculate for the following reasons:   The 2019 ASCVD risk score is only valid for ages 27 to 71   The patient has a prior MI or stroke diagnosis

## 2022-09-20 NOTE — Assessment & Plan Note (Addendum)
Continue aspirin statin and zetia. Sees cardiology s/p 5v CABG 2003

## 2022-09-20 NOTE — Assessment & Plan Note (Signed)
Chronic, stable on amlodipine and lisinopril.  

## 2022-09-20 NOTE — Assessment & Plan Note (Signed)
B12 now high - stop replacement x 2 wks then restart once weekly replacement.

## 2022-09-23 NOTE — Progress Notes (Signed)
I connected with  [name] on 09/05/2022 by a audio enabled telemedicine application and verified that I am speaking with the correct person using two identifiers.  Patient Location: Home  Provider Location: Home Office  I discussed the limitations of evaluation and management by telemedicine. The patient expressed understanding and agreed to proceed.   Subjective:   Kathleen Williamson is a 82 y.o. female who presents for Medicare Annual (Subsequent) preventive examination.  Review of Systems    Per HPI unless specifically indicated below.  Cardiac Risk Factors include: advanced age (>40men, >55 women);female gender          Objective:       09/19/2022    9:07 AM 08/14/2022    1:29 PM 06/25/2022    2:02 PM  Vitals with BMI  Height 5' 2.5" 5\' 3"  5\' 3"   Weight 133 lbs 4 oz 134 lbs 13 oz 136 lbs 2 oz  BMI 23.97 23.88 24.12  Systolic 128 110 010  Diastolic 66 52 66  Pulse 81 88 74    There were no vitals filed for this visit. There is no height or weight on file to calculate BMI.     09/05/2022   10:25 AM 11/14/2020    3:30 PM 11/06/2020    1:25 PM 10/29/2020    3:39 PM 10/26/2020    1:03 PM 10/06/2020    6:08 PM 02/16/2018    9:49 AM  Advanced Directives  Does Patient Have a Medical Advance Directive? Yes No No No No No Yes  Type of Estate agent of [place];Living will      Healthcare Power of [place];Living will  Does patient want to make changes to medical advance directive? No - Patient declined        Copy of Healthcare Power of Attorney in Chart? No - copy requested      No - copy requested  Would patient like information on creating a medical advance directive? No - Patient declined No - Patient declined No - Patient declined No - Patient declined       Current Medications (verified) Outpatient Encounter Medications as of 09/05/2022  Medication Sig   Accu-Chek FastClix Lancets MISC USE AS INSTRUCTED TO CHECK BLOOD SUGAR ONCE DAILY    acetaminophen (TYLENOL) 325 MG tablet Take 650 mg by mouth every 6 (six) hours as needed for moderate pain or headache.   amLODipine (NORVASC) 2.5 MG tablet Take 2.5 mg by mouth daily.   aspirin EC 81 MG tablet Take 81 mg by mouth daily. Swallow whole.   atorvastatin (LIPITOR) 40 MG tablet Take 1 tablet (40 mg total) by mouth daily.   Blood Glucose Monitoring Suppl (ACCU-CHEK GUIDE) w/Device KIT 1 each by Does not apply route as directed. Use as instructed to check blood sugar once daily   Cholecalciferol (VITAMIN D3) 25 MCG (1000 UT) CAPS Take 2 capsules (2,000 Units total) by mouth daily.   Coenzyme Q10 100 MG capsule Take 100 mg by mouth daily.   ezetimibe (ZETIA) 10 MG tablet Take 1 tablet (10 mg total) by mouth daily.   levothyroxine (SYNTHROID) 50 MCG tablet TAKE ONE TABLET BY MOUTH ONCE DAILY   lisinopril (ZESTRIL) 10 MG tablet Take 1 tablet (10 mg total) by mouth daily.   mirtazapine (REMERON) 15 MG tablet TAKE ONE TABLET BY MOUTH EVERYDAY AT BEDTIME   nitroGLYCERIN (NITROLINGUAL) 0.4 MG/SPRAY spray Place 1 spray under the tongue every 5 (five) minutes as needed.   [  DISCONTINUED] venlafaxine XR (EFFEXOR-XR) 75 MG 24 hr capsule TAKE THREE CAPSULES BY MOUTH EVERY MORNING   [DISCONTINUED] vitamin B-12 (CYANOCOBALAMIN) 1000 MCG tablet Take 1 tablet (1,000 mcg total) by mouth daily.   denosumab (PROLIA) 60 MG/ML SOSY injection Inject 60 mg into the skin every 6 (six) months.   glucose blood (ONETOUCH VERIO) test strip Use as instructed to check blood sugar once a day   loperamide (IMODIUM A-D) 2 MG tablet Take 2 mg by mouth 4 (four) times daily as needed for diarrhea or loose stools.   [DISCONTINUED] citalopram (CELEXA) 20 MG tablet Take 20 mg by mouth daily.   [DISCONTINUED] clonazePAM (KLONOPIN) 0.5 MG tablet Take 0.5 mg by mouth daily.   No facility-administered encounter medications on file as of 09/05/2022.    Allergies (verified) Sulfa antibiotics and Crestor [rosuvastatin calcium]    History: Past Medical History:  Diagnosis Date   Arthritis    fingers   CAD (coronary artery disease) 2003   MI s/p 5v CABG   Carotid stenosis    Skains   CKD (chronic kidney disease) stage 3, GFR 30-59 ml/min (HCC)    Complicated UTI (urinary tract infection) 10/31/2020   Dermatomyositis (HCC)    saw Dr Corliss Skains, improved on its own   Diabetes mellitus without complication (HCC)    Forehead laceration 09/15/2017   History of chicken pox    History of measles    History of migraine none since 2003   HLD (hyperlipidemia)    Hypothyroidism    MDD (major depressive disorder), recurrent episode, moderate (HCC)    h/o SI after lost husband unexpectedly 2015   Microscopic colitis 05/2014   by colonoscopy, lymphocytic, zoloft related   Myocardial infarction (HCC) 2003   Osteoporosis    DEXA 03/2013 T -3.1, DEXA 10//2016 -2.9 hip, -2.2 spine   Pancreatic cyst    Dr Arline Asp, no f/u needed   Prediabetes 2014   Subclavian steal syndrome    Urine incontinence    Past Surgical History:  Procedure Laterality Date   APPENDECTOMY  1997   with hysterectomy   BACK SURGERY  1981   lower   BREAST LUMPECTOMY Left 1977   benign   COLONOSCOPY WITH PROPOFOL N/A 05/23/2014   microscopic colitis - Charolett Bumpers, MD   CORONARY ARTERY BYPASS GRAFT  2003   x 5 v   ESOPHAGOGASTRODUODENOSCOPY (EGD) WITH PROPOFOL N/A 05/23/2014   [facility] Charolett Bumpers, MD   ROTATOR CUFF REPAIR Right [phone]   TOOTH EXTRACTION  yrs ago   TOTAL ABDOMINAL HYSTERECTOMY W/ BILATERAL SALPINGOOPHORECTOMY  1997   endometriosis   TOTAL HIP ARTHROPLASTY Right 11/14/2020   Procedure: TOTAL HIP ARTHROPLASTY ANTERIOR APPROACH;  Surgeon: Ollen Gross, MD;  Location: [facility];  Service: Orthopedics;  Laterality: Right;   Family History  Problem Relation Age of Onset   CAD Father 80   CAD Mother 40   Diabetes Mother    Rheum arthritis Mother    Cancer Maternal Aunt        breast   CAD Other        strong paternal  side   Stroke Neg Hx    Social History   Socioeconomic History   Marital status: Widowed    Spouse name: Not on file   Number of children: 0   Years of education: Not on file   Highest education level: Not on file  Occupational History   Occupation: Retired  Tobacco Use   Smoking status:  Never   Smokeless tobacco: Never  Vaping Use   Vaping Use: Never used  Substance and Sexual Activity   Alcohol use: No   Drug use: No   Sexual activity: Not Currently  Other Topics Concern   Not on file  Social History Narrative   L[facility] at [facility].   Husband of 47 yrs died suddenly October 26, 2013.   No children, no siblings.    Close friends Darel Hong and Loann Quill nearby, cousin in [place].   Activity: going to [employer]   Financial Resource Strain: Low Risk  (09/05/2022)   Overall Financial Resource Strain (CARDIA)    Difficulty of Paying Living Expenses: Not hard at all  Food Insecurity: No Food Insecurity (09/05/2022)   Hunger Vital Sign    Worried About Running Out of Food in the Last Year: Never true    Ran Out of Food in the Last Year: Never true  Transportation Needs: No Transportation Needs (09/05/2022)   PRAPARE - Administrator, Civil Service (Medical): No    Lack of Transportation (Non-Medical): No  Physical Activity: Inactive (09/05/2022)   Exercise Vital Sign    Days of Exercise per Week: 0 days    Minutes of Exercise per Session: 0 min  Stress: No Stress Concern Present (09/05/2022)   [employer] of Occupational Health - Occupational Stress Questionnaire    Feeling of Stress : Not at all  Social Connections: Moderately Integrated (09/05/2022)   Social Connection and Isolation Panel [NHANES]    Frequency of Communication with Friends and Family: More than three times a week    Frequency of Social Gatherings with Friends and Family: Once a week    Attends Religious Services: More than 4 times per year    Active Member of [employer] or  Organizations: Yes    Attends Banker Meetings: Never    Marital Status: Widowed    Tobacco Counseling Counseling given: No   Clinical Intake:  Pre-visit preparation completed: No  Pain : No/denies pain     Nutritional Status: BMI of 19-24  Normal Nutritional Risks: None Diabetes: Yes CBG done?: No Did pt. bring in CBG monitor from home?: No  How often do you need to have someone help you when you read instructions, pamphlets, or other written materials from your doctor or pharmacy?: 1 - Never  Diabetic?Nutrition Risk Assessment:  Has the patient had any N/V/D within the last 2 months?  No  Does the patient have any non-healing wounds?  No  Has the patient had any unintentional weight loss or weight gain?  No   Diabetes:  Is the patient diabetic?  Yes  If diabetic, was a CBG obtained today?  No  Did the patient bring in their glucometer from home?  No  How often do you monitor your CBG's? Occasionally .   Financial Strains and Diabetes Management:  Are you having any financial strains with the device, your supplies or your medication? No .  Does the patient want to be seen by Chronic Care Management for management of their diabetes?  No  Would the patient like to be referred to a Nutritionist or for Diabetic Management?  No   Diabetic Exams:  Diabetic Eye Exam: Completed 07/09/2022 Diabetic Foot Exam: Overdue, Pt has been advised about the importance in completing this exam. Pt is scheduled for diabetic foot exam on at next diabetic appointment .     Interpreter Needed?: No  Information entered by :: Laurel Dimmer, CMA   Activities of Daily Living    09/05/2022   10:17 AM  In your present state of health, do you have any difficulty performing the following activities:  Hearing? 1  Vision? 1  Comment [facility]  Difficulty concentrating or making decisions? 0  Walking or climbing stairs? 0  Dressing or bathing? 0  Doing errands,  shopping? 0    Patient Care Team: Eustaquio Boyden, MD as PCP - General (Family Medicine) Jake Bathe, MD as PCP - Cardiology (Cardiology) Dingeldein, Viviann Spare, MD as Referring Physician (Ophthalmology) Kathyrn Sheriff, [facility] as Pharmacist (Pharmacist)  Indicate any recent Medical Services you may have received from other than [facility] providers in the past year (date may be approximate).     Assessment:   This is a routine wellness examination for Adelin.  Hearing/Vision screen Admit to some hearing loss that she associate with age. Denies any changes with her vision. Annual Eye Exam: [facility]   Dietary issues and exercise activities discussed: Current Exercise Habits: The patient does not participate in regular exercise at present, Exercise limited by: None identified   Goals Addressed   None   Depression Screen    09/05/2022   10:15 AM 06/25/2022    2:43 PM 09/17/2021    4:12 PM 08/21/2021    2:37 PM 10/30/2020    2:46 PM 06/18/2020   11:17 AM 05/29/2020    2:36 PM  PHQ 2/9 Scores  PHQ - 2 Score 2 1 2 6 4 1 2   PHQ- 9 Score  1 3 14 12 2 7     Fall Risk    09/05/2022   10:17 AM 09/17/2021    2:37 PM 06/18/2020   10:05 AM 02/22/2019   10:20 AM 02/16/2018    9:27 AM  Fall Risk   Falls in the past year? 0 0 1 0 1  Comment     fell after tripping over broken concrete on sidewalk; loss of consciousness and head trauma  Number falls in past yr: 0  1  0  Injury with Fall? 0  0  1  Risk for fall due to : No Fall Risks      Follow up Falls evaluation completed        FALL RISK PREVENTION PERTAINING TO THE HOME:  Any stairs in or around the home? Yes  If so, are there any without handrails? No  Home free of loose throw rugs in walkways, pet beds, electrical cords, etc? Yes  Adequate lighting in your home to reduce risk of falls? Yes   ASSISTIVE DEVICES UTILIZED TO PREVENT FALLS:  Life alert? Yes  Use of a cane, walker or w/c? No  Grab bars in the bathroom? No   Shower chair or bench in shower? Yes  Elevated toilet seat or a handicapped toilet? Yes   TIMED UP AND GO:  Was the test performed? Unable to perform, virtual appointment   Cognitive Function:    02/16/2018    9:49 AM 02/10/2017    8:59 AM 02/04/2016    9:00 AM  MMSE - Mini Mental State Exam  Orientation to time 5 5 5   Orientation to Place 5 5 5   Registration 3 3 3   Attention/ Calculation 0 0 0  Recall 3 3 3   Language- name 2 objects 0 0 0  Language- repeat 1 1 1   Language- follow 3 step command 3 3 3   Language- read &  follow direction 0 0 0  Write a sentence 0 0 0  Copy design 0 0 0  Total score 20 20 20         09/05/2022   10:21 AM  6CIT Screen  What Year? 0 points  What month? 0 points  What time? 0 points  Count back from 20 0 points  Months in reverse 0 points  Repeat phrase 0 points  Total Score 0 points    Immunizations Immunization History  Administered Date(s) Administered   Fluad Quad(high Dose 65+) 12/28/2018, 02/26/2022   Influenza, High Dose Seasonal PF 12/18/2017, 12/23/2018, 01/23/2020   Influenza,inj,Quad PF,6+ Mos 01/22/2015, 02/04/2016, 12/19/2016   Influenza-Unspecified 04/01/2011   Moderna SARS-COV2 Booster Vaccination 02/24/2020, 08/30/2020   Moderna Sars-Covid-2 Vaccination 04/29/2019, 05/27/2019   PPD Test 11/30/2020   Pfizer Covid-19 Vaccine Bivalent Booster 83yrs & up 01/03/2021   Pneumococcal Conjugate-13 02/04/2016   Pneumococcal-Unspecified 03/24/2007   Td 03/24/2007, 09/05/2016   Tdap 09/09/2017, 01/25/2018, 09/12/2019   Zoster Recombinat (Shingrix) 02/20/2017, 05/02/2017   Zoster, Live 04/14/2009    TDAP status: Up to date  Flu Vaccine status: Up to date  Pneumococcal vaccine status: Due, Education has been provided regarding the importance of this vaccine. Advised may receive this vaccine at local pharmacy or Health Dept. Aware to provide a copy of the vaccination record if obtained from local pharmacy or Health Dept.  Verbalized acceptance and understanding.  Covid-19 vaccine status: Information provided on how to obtain vaccines.   Qualifies for Shingles Vaccine? Yes   Zostavax completed Yes   Shingrix Completed?: Yes  Screening Tests Health Maintenance  Topic Date Due   Pneumonia Vaccine 24+ Years old (2 of 2 - PPSV23 or PCV20) 02/03/2017   COVID-19 Vaccine (6 - 2023-24 season) 12/13/2021   FOOT EXAM  06/19/2022   MAMMOGRAM  09/05/2023 (Originally 06/23/2020)   INFLUENZA VACCINE  11/13/2022   HEMOGLOBIN A1C  03/17/2023   OPHTHALMOLOGY EXAM  07/09/2023   Medicare Annual Wellness (AWV)  09/05/2023   Diabetic kidney evaluation - eGFR measurement  09/15/2023   Diabetic kidney evaluation - Urine ACR  09/19/2023   DTaP/Tdap/Td (6 - Td or Tdap) 09/11/2029   DEXA SCAN  Completed   Zoster Vaccines- Shingrix  Completed   HPV VACCINES  Aged Out   Hepatitis C Screening  Discontinued    Health Maintenance  Health Maintenance Due  Topic Date Due   Pneumonia Vaccine 53+ Years old (2 of 2 - PPSV23 or PCV20) 02/03/2017   COVID-19 Vaccine (6 - 2023-24 season) 12/13/2021   FOOT EXAM  06/19/2022    Colorectal cancer screening: No longer required.   Mammogram status: No longer required due to age.  DEXA Scan: 06/24/2019  Lung Cancer Screening: (Low Dose CT Chest recommended if Age 19-80 years, 30 pack-year currently smoking OR have quit w/in 15years.) does not qualify.   Lung Cancer Screening Referral: not applicable   Additional Screening:  Hepatitis C Screening: does not qualify;   Vision Screening: Recommended annual ophthalmology exams for early detection of glaucoma and other disorders of the eye. Is the patient up to date with their annual eye exam?  No  Who is the provider or what is the name of the office in which the patient attends annual eye exams? [facility]  If pt is not established with a provider, would they like to be referred to a provider to establish care? No .    Dental Screening: Recommended annual dental exams for proper oral  hygiene  Community Resource Referral / Chronic Care Management: CRR required this visit?  No   CCM required this visit?  No      Plan:     I have personally reviewed and noted the following in the patient's chart:   Medical and social history Use of alcohol, tobacco or illicit drugs  Current medications and supplements including opioid prescriptions. Patient is not currently taking opioid prescriptions. Functional ability and status Nutritional status Physical activity Advanced directives List of other physicians Hospitalizations, surgeries, and ER visits in previous 12 months Vitals Screenings to include cognitive, depression, and falls Referrals and appointments  In addition, I have reviewed and discussed with patient certain preventive protocols, quality metrics, and best practice recommendations. A written personalized care plan for preventive services as well as general preventive health recommendations were provided to patient.     Ms. Hoskin , Thank you for taking time to come for your Medicare Wellness Visit. I appreciate your ongoing commitment to your health goals. Please review the following plan we discussed and let me know if I can assist you in the future.   These are the goals we discussed:  Goals   None     This is a list of the screening recommended for you and due dates:  Health Maintenance  Topic Date Due   Pneumonia Vaccine (2 of 2 - PPSV23 or PCV20) 02/03/2017   COVID-19 Vaccine (6 - 2023-24 season) 12/13/2021   Complete foot exam   06/19/2022   Mammogram  09/05/2023*   Flu Shot  11/13/2022   Hemoglobin A1C  03/17/2023   Eye exam for diabetics  07/09/2023   Medicare Annual Wellness Visit  09/05/2023   Yearly kidney function blood test for diabetes  09/15/2023   Yearly kidney health urinalysis for diabetes  09/19/2023   DTaP/Tdap/Td vaccine (6 - Td or Tdap) 09/11/2029   DEXA  scan (bone density measurement)  Completed   Zoster (Shingles) Vaccine  Completed   HPV Vaccine  Aged Out   Hepatitis C Screening  Discontinued  *Topic was postponed. The date shown is not the original due date.    Ms. Carney , Thank you for taking time to come for your Medicare Wellness Visit. I appreciate your ongoing commitment to your health goals. Please review the following plan we discussed and let me know if I can assist you in the future.   These are the goals we discussed:  Goals   None     This is a list of the screening recommended for you and due dates:  Health Maintenance  Topic Date Due   Pneumonia Vaccine (2 of 2 - PPSV23 or PCV20) 02/03/2017   COVID-19 Vaccine (6 - 2023-24 season) 12/13/2021   Complete foot exam   06/19/2022   Mammogram  09/05/2023*   Flu Shot  11/13/2022   Hemoglobin A1C  03/17/2023   Eye exam for diabetics  07/09/2023   Medicare Annual Wellness Visit  09/05/2023   Yearly kidney function blood test for diabetes  09/15/2023   Yearly kidney health urinalysis for diabetes  09/19/2023   DTaP/Tdap/Td vaccine (6 - Td or Tdap) 09/11/2029   DEXA scan (bone density measurement)  Completed   Zoster (Shingles) Vaccine  Completed   HPV Vaccine  Aged Out   Hepatitis C Screening  Discontinued  *Topic was postponed. The date shown is not the original due date.    Laurel Dimmer, [facility]   09/05/2022  Nurse Notes: Approximately 30 minute  Non-Face -To-Face Medicare Wellness Visit

## 2022-09-23 NOTE — Addendum Note (Signed)
Addended by: Carlynn Herald E on: 09/23/2022 09:06 AM   Modules accepted: Level of Service

## 2022-09-24 LAB — ANTI-U1 RNP AB (RDL): Anti-U1 RNP Ab (RDL): <20 Units (ref <20)

## 2022-09-25 ENCOUNTER — Other Ambulatory Visit (HOSPITAL_COMMUNITY): Payer: Self-pay

## 2022-09-25 NOTE — Addendum Note (Signed)
Addended by: Lonna Cobb on: 09/25/2022 02:49 PM   Modules accepted: Level of Service

## 2022-09-29 ENCOUNTER — Other Ambulatory Visit (HOSPITAL_COMMUNITY): Payer: Self-pay

## 2022-09-29 NOTE — Telephone Encounter (Signed)
Please see encounter on 06/16/22. Patient's insurance has not changed.

## 2022-10-01 ENCOUNTER — Other Ambulatory Visit: Payer: Self-pay

## 2022-10-01 MED ORDER — EZETIMIBE 10 MG PO TABS: 10.0000 mg | ORAL_TABLET | Freq: Every day | ORAL | 3 refills | Status: DC

## 2022-10-14 NOTE — Telephone Encounter (Signed)
Left message to return call to our office.  

## 2022-10-14 NOTE — Telephone Encounter (Signed)
Per benefits patient will need script called into [facility] pharmacy cost will be 200 and patient will have 20 dollar co pay when she comes in office. Need to call patient and set up lab and nurse visit as well as order injection

## 2022-10-20 ENCOUNTER — Other Ambulatory Visit: Payer: Self-pay

## 2022-10-20 ENCOUNTER — Other Ambulatory Visit (HOSPITAL_COMMUNITY): Payer: Self-pay

## 2022-10-20 DIAGNOSIS — M810 Age-related osteoporosis without current pathological fracture: Secondary | ICD-10-CM

## 2022-10-20 MED ORDER — DENOSUMAB 60 MG/ML ~~LOC~~ SOSY
60.0000 mg | PREFILLED_SYRINGE | Freq: Once | SUBCUTANEOUS | 0 refills | Status: AC
Start: 2022-10-20 — End: 2022-10-30
  Filled 2022-10-20: qty 1, 1d supply, fill #0
  Filled 2022-10-21: qty 1, 180d supply, fill #0

## 2022-10-20 NOTE — Telephone Encounter (Signed)
Called patient reviewed all following information including appointment, Co pay due at time of visit and if pick up of injection is needed from outside pharmacy.     Out of pocket for patient: $200 at pharmacy and $20 copay   Lab appointment : 10/29/22  Nurse visit: 11/04/22  Lab order placed: Yes  Prolia has been  []   Ordered  []   Script sent to local pharmacy for patient to bring   []   Script sent to Specialty pharmacy   [x]   Script sent to [facility] to deliver 10/20/22

## 2022-10-20 NOTE — Telephone Encounter (Signed)
Left message to return call to our office.  

## 2022-10-21 ENCOUNTER — Other Ambulatory Visit: Payer: Self-pay

## 2022-10-28 ENCOUNTER — Other Ambulatory Visit: Payer: Self-pay | Admitting: Family Medicine

## 2022-10-28 ENCOUNTER — Other Ambulatory Visit (HOSPITAL_COMMUNITY): Payer: Self-pay

## 2022-10-28 DIAGNOSIS — I10 Essential (primary) hypertension: Secondary | ICD-10-CM

## 2022-10-29 ENCOUNTER — Other Ambulatory Visit (INDEPENDENT_AMBULATORY_CARE_PROVIDER_SITE_OTHER): Payer: PPO

## 2022-10-29 ENCOUNTER — Other Ambulatory Visit: Payer: Self-pay

## 2022-10-29 DIAGNOSIS — M810 Age-related osteoporosis without current pathological fracture: Secondary | ICD-10-CM | POA: Diagnosis not present

## 2022-10-29 LAB — BASIC METABOLIC PANEL
BUN: 15 mg/dL (ref 6–23)
CO2: 24 mEq/L (ref 19–32)
Calcium: 9.8 mg/dL (ref 8.4–10.5)
Chloride: 104 mEq/L (ref 96–112)
Creatinine, Ser: 1.66 mg/dL — ABNORMAL HIGH (ref 0.40–1.20)
GFR: 28.89 mL/min — ABNORMAL LOW (ref >60.00)
Glucose, Bld: 208 mg/dL — ABNORMAL HIGH (ref 70–99)
Potassium: 4.5 mEq/L (ref 3.5–5.1)
Sodium: 138 mEq/L (ref 135–145)

## 2022-11-04 ENCOUNTER — Ambulatory Visit (INDEPENDENT_AMBULATORY_CARE_PROVIDER_SITE_OTHER): Payer: PPO

## 2022-11-04 DIAGNOSIS — N183 Chronic kidney disease, stage 3 unspecified: Secondary | ICD-10-CM

## 2022-11-04 DIAGNOSIS — M810 Age-related osteoporosis without current pathological fracture: Secondary | ICD-10-CM | POA: Diagnosis not present

## 2022-11-04 MED ORDER — DENOSUMAB 60 MG/ML ~~LOC~~ SOSY
60.0000 mg | PREFILLED_SYRINGE | Freq: Once | SUBCUTANEOUS | Status: AC
Start: 2022-11-04 — End: 2022-11-04
  Administered 2022-11-04: 60 mg via SUBCUTANEOUS

## 2022-11-04 NOTE — Addendum Note (Signed)
Addended by: Eustaquio Boyden on: 11/04/2022 05:11 PM   Modules accepted: Orders

## 2022-11-04 NOTE — Progress Notes (Signed)
BMP ordered for future labs.

## 2022-11-04 NOTE — Progress Notes (Signed)
Per orders of Dr. Eustaquio Boyden, injection of Prolia given by Donnamarie Poag in left deltoid. Patient tolerated injection well. Reviewed labs with Dr. Sharen Hones, advised ok to do injection today but would like labs checked in 10 days. I have scheduled appointment with patient in office. She will call if any questions.

## 2022-11-05 ENCOUNTER — Ambulatory Visit: Payer: PPO | Attending: Nurse Practitioner

## 2022-11-05 DIAGNOSIS — E785 Hyperlipidemia, unspecified: Secondary | ICD-10-CM | POA: Diagnosis not present

## 2022-11-05 DIAGNOSIS — I5022 Chronic systolic (congestive) heart failure: Secondary | ICD-10-CM

## 2022-11-05 DIAGNOSIS — I2510 Atherosclerotic heart disease of native coronary artery without angina pectoris: Secondary | ICD-10-CM | POA: Diagnosis not present

## 2022-11-05 DIAGNOSIS — E875 Hyperkalemia: Secondary | ICD-10-CM

## 2022-11-06 LAB — LIPID PANEL
Chol/HDL Ratio: 3.8 ratio (ref 0.0–4.4)
Cholesterol, Total: 154 mg/dL (ref 100–199)
HDL: 41 mg/dL (ref >39)
LDL Chol Calc (NIH): 74 mg/dL (ref 0–99)
Triglycerides: 236 mg/dL — ABNORMAL HIGH (ref 0–149)
VLDL Cholesterol Cal: 39 mg/dL (ref 5–40)

## 2022-11-07 NOTE — Progress Notes (Signed)
Cardiology Office Note:    Date:  11/17/2022   ID:  Kathleen Williamson, DOB 11-09-1940, MRN [id number]  PCP:  Eustaquio Boyden, MD   [facility] Providers Cardiologist:  Donato Schultz, MD     Referring MD: Eustaquio Boyden, MD   Chief Complaint: follow-up cardiomyopathy  History of Present Illness:    Kathleen Williamson is a very pleasant 82 y.o. female with a hx of CAD, HTN, HLD, vertebral artery occlusion, LBBB, diabetes, hypothyroidism, CKD, chronic HFrEF/cardiomyopathy, frequent falls, and carotid artery disease.  History of coronary artery disease s/p CABG x 5 in 2003 (LIMA-LAD, free right IMA to PDA, SVG-diagonal, SVG-OM2-OM5).  Former patient of Dr. Ty Hilts and is now followed by Dr. Anne Fu.   Her husband died in 09/29/13.  She has struggled in the past with grief.  Is without any children or family.  Lives at [facility] on [address] and enjoys it there.  Has a cat named Saki.  She underwent right total hip arthroplasty with Dr. Antony Odea on 11/14/2020.  She underwent Lexiscan stress test which was low risk and showed normal pumping function at that time.  EKG at office visit with Robin Searing, NP 10/31/21 revealed new LBBB.  TTE was completed 11/27/21 which revealed LVEF 30 to 35%, global hypokinesis with septal lateral dyssynchrony consistent with LBBB, G1 DD, mildly reduced RV function, no significant valve disease.  She was advised to start Entresto 24-26 mg twice daily, however she developed diarrhea. She was resumed on lisinopril 10 mg daily.   At follow-up visit with Dr. Anne Fu on 02/12/2022 she was doing well with no signs of hypervolemia.  Her EKG at baseline shows T wave inversions in the precordial leads which are prolonged changes for her. Recent TTE revealed no improvement in EF. Dr. Anne Fu did not recommend pursuing EP consult and 94-month follow-up was recommended.  Seen in follow-up by me on 08/14/22. Reported she is doing well. Continues to drive. Lives alone in a retirement  community in an apartment. Says she often gets lonely, admits that she eats out of boredom. Used to socialize with neighbors, not so much anymore. Does not exercise on a consistent basis but has access to the gym on site. Admits she gets short of breath when walking quickly or up an incline. This resolves when she slows down her pace or stops to rest. She denied chest pain, lower extremity edema, fatigue, palpitations, melena, hematuria, hemoptysis, diaphoresis, weakness, presyncope, syncope, orthopnea, and PND.  She did not tolerate Entresto or Farxiga in the past.  Due to decreased LV function and SOB, we discontinued amlodipine and started spironolactone 12.5 mg daily.  She was encouraged to gradually increase activity and eat a heart healthy, mostly plant-based diet.  Her potassium was elevated at 5.5 on BMP 08/28/2022.  She was advised to hold spironolactone and limit intake of high potassium foods.  Today, she is here for 3 month follow-up. She is feeling well. Is walking some for exercise, but is limited by the heat.  There are lots of exercise class opportunities at her retirement center but she has not been participating. Wants to talk to the instructor about ab exercises. She denies shortness of breath or dyspnea. No chest pain, lower extremity edema, fatigue, palpitations, presyncope, syncope, orthopnea, and PND. We discussed elevated triglycerides on recent lab work. Admits that she loves potatoes and sweets.   Past Medical History:  Diagnosis Date   Arthritis    fingers   CAD (coronary  artery disease) 2003   MI s/p 5v CABG   Carotid stenosis    Skains   CKD (chronic kidney disease) stage 3, GFR 30-59 ml/min (HCC)    Complicated UTI (urinary tract infection) 10/31/2020   Dermatomyositis (HCC)    saw Dr Corliss Skains, improved on its own   Diabetes mellitus without complication (HCC)    Forehead laceration 09/15/2017   History of chicken pox    History of measles    History of migraine none  since 2003   HLD (hyperlipidemia)    Hypothyroidism    MDD (major depressive disorder), recurrent episode, moderate (HCC)    h/o SI after lost husband unexpectedly 2015   Microscopic colitis 05/2014   by colonoscopy, lymphocytic, zoloft related   Myocardial infarction (HCC) 2003   Osteoporosis    DEXA 03/2013 T -3.1, DEXA 10//2016 -2.9 hip, -2.2 spine   Pancreatic cyst    Dr Arline Asp, no f/u needed   Prediabetes 2014   Subclavian steal syndrome    Urine incontinence     Past Surgical History:  Procedure Laterality Date   APPENDECTOMY  1997   with hysterectomy   BACK SURGERY  1981   lower   BREAST LUMPECTOMY Left 1977   benign   COLONOSCOPY WITH PROPOFOL N/A 05/23/2014   microscopic colitis - Charolett Bumpers, MD   CORONARY ARTERY BYPASS GRAFT  2003   x 5 v   ESOPHAGOGASTRODUODENOSCOPY (EGD) WITH PROPOFOL N/A 05/23/2014   [facility] Charolett Bumpers, MD   ROTATOR CUFF REPAIR Right [phone]   TOOTH EXTRACTION  yrs ago   TOTAL ABDOMINAL HYSTERECTOMY W/ BILATERAL SALPINGOOPHORECTOMY  1997   endometriosis   TOTAL HIP ARTHROPLASTY Right 11/14/2020   Procedure: TOTAL HIP ARTHROPLASTY ANTERIOR APPROACH;  Surgeon: Ollen Gross, MD;  Location: [facility];  Service: Orthopedics;  Laterality: Right;    Current Medications: Current Meds  Medication Sig   Accu-Chek FastClix Lancets MISC USE AS INSTRUCTED TO CHECK BLOOD SUGAR ONCE DAILY   acetaminophen (TYLENOL) 325 MG tablet Take 650 mg by mouth every 6 (six) hours as needed for moderate pain or headache.   amLODipine (NORVASC) 2.5 MG tablet TAKE ONE TABLET BY MOUTH EVERY MORNING   aspirin EC 81 MG tablet Take 81 mg by mouth daily. Swallow whole.   atorvastatin (LIPITOR) 40 MG tablet Take 1 tablet (40 mg total) by mouth daily.   Blood Glucose Monitoring Suppl (ACCU-CHEK GUIDE) w/Device KIT 1 each by Does not apply route as directed. Use as instructed to check blood sugar once daily   Cholecalciferol (VITAMIN D3) 25 MCG (1000 UT) CAPS Take 2 capsules  (2,000 Units total) by mouth daily.   Coenzyme Q10 100 MG capsule Take 100 mg by mouth daily.   cyanocobalamin (VITAMIN B12) 1000 MCG tablet Take 1 tablet (1,000 mcg total) by mouth once a week.   denosumab (PROLIA) 60 MG/ML SOSY injection Inject 60 mg into the skin every 6 (six) months.   ezetimibe (ZETIA) 10 MG tablet Take 1 tablet (10 mg total) by mouth daily.   glucose blood (ONETOUCH VERIO) test strip Use as instructed to check blood sugar once a day   levothyroxine (SYNTHROID) 50 MCG tablet TAKE ONE TABLET BY MOUTH ONCE DAILY   loperamide (IMODIUM A-D) 2 MG tablet Take 2 mg by mouth 4 (four) times daily as needed for diarrhea or loose stools.   mirtazapine (REMERON) 15 MG tablet TAKE ONE TABLET BY MOUTH EVERYDAY AT BEDTIME   nitroGLYCERIN (NITROLINGUAL) 0.4 MG/SPRAY spray  Place 1 spray under the tongue every 5 (five) minutes as needed.   venlafaxine XR (EFFEXOR-XR) 75 MG 24 hr capsule TAKE THREE CAPSULES BY MOUTH EVERY MORNING   [DISCONTINUED] lisinopril (ZESTRIL) 10 MG tablet Take 1 tablet (10 mg total) by mouth daily.     Allergies:   Sulfa antibiotics and Crestor [rosuvastatin calcium]   Social History   Socioeconomic History   Marital status: Widowed    Spouse name: Not on file   Number of children: 0   Years of education: Not on file   Highest education level: Not on file  Occupational History   Occupation: Retired  Tobacco Use   Smoking status: Never   Smokeless tobacco: Never  Vaping Use   Vaping status: Never Used  Substance and Sexual Activity   Alcohol use: No   Drug use: No   Sexual activity: Not Currently  Other Topics Concern   Not on file  Social History Narrative   Lives alone at [place].   Husband of 47 yrs died suddenly 10-08-2013.   No children, no siblings.    Close friends Darel Hong and Loann Quill nearby, cousin in [place].   Activity: going to [employer]   Financial Resource Strain: Low Risk  (09/05/2022)   Overall Financial  Resource Strain (CARDIA)    Difficulty of Paying Living Expenses: Not hard at all  Food Insecurity: No Food Insecurity (09/05/2022)   Hunger Vital Sign    Worried About Running Out of Food in the Last Year: Never true    Ran Out of Food in the Last Year: Never true  Transportation Needs: No Transportation Needs (09/05/2022)   PRAPARE - Administrator, Civil Service (Medical): No    Lack of Transportation (Non-Medical): No  Physical Activity: Inactive (09/05/2022)   Exercise Vital Sign    Days of Exercise per Week: 0 days    Minutes of Exercise per Session: 0 min  Stress: No Stress Concern Present (09/05/2022)   [employer] of Occupational Health - Occupational Stress Questionnaire    Feeling of Stress : Not at all  Social Connections: Moderately Integrated (09/05/2022)   Social Connection and Isolation Panel [NHANES]    Frequency of Communication with Friends and Family: More than three times a week    Frequency of Social Gatherings with Friends and Family: Once a week    Attends Religious Services: More than 4 times per year    Active Member of [employer] or Organizations: Yes    Attends Banker Meetings: Never    Marital Status: Widowed     Family History: The patient's family history includes CAD in an other family member; CAD (age of onset: 54) in her father; CAD (age of onset: 44) in her mother; Cancer in her maternal aunt; Diabetes in her mother; Rheum arthritis in her mother. There is no history of Stroke.  ROS:   Please see the history of present illness.   All other systems reviewed and are negative.  Labs/Other Studies Reviewed:    The following studies were reviewed today:  Cardiac Studies & Procedures     STRESS TESTS  MYOCARDIAL PERFUSION IMAGING 09/14/2020  Narrative  The left ventricular ejection fraction is normal (55-65%).  Nuclear stress EF: 61%.  There was no ST segment deviation noted during stress.  No T wave inversion was  noted during stress.  The study is normal.  This is a low risk study.  1.  Normal study without ischemia or infarction. 2. Normal LVEF, 61%. 3. This is a low risk study.   ECHOCARDIOGRAM  ECHOCARDIOGRAM COMPLETE 11/27/2021  1. Left ventricular ejection fraction, by estimation, is 30 to 35%. The  left ventricle has moderately decreased function. The left ventricle  demonstrates global hypokinesis with septal-lateral dyssynchrony  consistent with LBBB. Left ventricular  diastolic parameters are consistent with Grade I diastolic dysfunction  (impaired relaxation).   2. Right ventricular systolic function is mildly reduced. The right  ventricular size is normal. Tricuspid regurgitation signal is inadequate  for assessing PA pressure.   3. The mitral valve is normal in structure. Trivial mitral valve  regurgitation. No evidence of mitral stenosis.   4. The aortic valve is tricuspid. Aortic valve regurgitation is not  visualized. No aortic stenosis is present.   5. The inferior vena cava is normal in size with greater than 50%  respiratory variability, suggesting right atrial pressure of 3 mmHg.  ECHOCARDIOGRAM COMPLETE 02/03/2022   1. Left ventricular ejection fraction, by estimation, is 30 to 35%. The left ventricle has moderately decreased function. The left ventricle demonstrates global hypokinesis. Abnormal (paradoxical) septal motion, consistent with left bundle branch block Left ventricular diastolic parameters are consistent with Grade I diastolic dysfunction (impaired relaxation). 2. Right ventricular systolic function is mildly reduced. The right ventricular size is normal. Tricuspid regurgitation signal is inadequate for assessing PA pressure. 3. The mitral valve is normal in structure. Trivial mitral valve regurgitation. No evidence of mitral stenosis. 4. The aortic valve is tricuspid. Aortic valve regurgitation is not visualized. No aortic stenosis is  present.  FINDINGS Left Ventricle: Left ventricular ejection fraction, by estimation, is 30 to 35%. The left ventricle has moderately decreased function. The left ventricle demonstrates global hypokinesis. 3D left ventricular ejection fraction analysis performed but not reported based on interpreter judgement due to suboptimal tracking. The left ventricular internal cavity size was normal in size. There is no left ventricular hypertrophy. Abnormal (paradoxical) septal motion, consistent with left bundle branch block. Left ventricular diastolic parameters are consistent with Grade I diastolic dysfunction (impaired relaxation).  Right Ventricle: The right ventricular size is normal. Right vetricular wall thickness was not well visualized. Right ventricular systolic function is mildly reduced. Tricuspid regurgitation signal is inadequate for assessing PA pressure.  Left Atrium: Left atrial size was normal in size.  Right Atrium: Right atrial size was normal in size.  Pericardium: There is no evidence of pericardial effusion.  Mitral Valve: The mitral valve is normal in structure. Trivial mitral valve regurgitation. No evidence of mitral valve stenosis.  Tricuspid Valve: The tricuspid valve is normal in structure. Tricuspid valve regurgitation is trivial.  Aortic Valve: The aortic valve is tricuspid. Aortic valve regurgitation is not visualized. No aortic stenosis is present.  Pulmonic Valve: The pulmonic valve was not well visualized. Pulmonic valve regurgitation is trivial.  Aorta: The aortic root and ascending aorta are structurally normal, with no evidence of dilitation.  IAS/Shunts: The interatrial septum was not well visualized.               Recent Labs: 09/15/2022: ALT 18; Hemoglobin 11.8; Platelets 297.0; TSH 1.04 10/29/2022: BUN 15; Creatinine, Ser 1.66; Potassium 4.5; Sodium 138  Recent Lipid Panel    Component Value Date/Time   CHOL 154 11/05/2022 0954   CHOL 206 06/20/2014  0000   TRIG 236 (H) 11/05/2022 0954   TRIG 155 06/20/2014 0000   HDL 41 11/05/2022 0954   HDL 39 06/20/2014 0000  CHOLHDL 3.8 11/05/2022 0954   CHOLHDL 3 09/15/2022 0911   VLDL 35.2 09/15/2022 0911   LDLCALC 74 11/05/2022 0954   LDLCALC 135 06/20/2014 0000   LDLDIRECT 79.0 09/17/2021 1532     Risk Assessment/Calculations:      Physical Exam:    VS:  BP 128/64   Pulse 83   Ht 5' 2.5" (1.588 m)   Wt 137 lb 6.4 oz (62.3 kg)   LMP  (LMP Unknown)   SpO2 99%   BMI 24.73 kg/m     Wt Readings from Last 3 Encounters:  11/17/22 137 lb 6.4 oz (62.3 kg)  09/19/22 133 lb 4 oz (60.4 kg)  08/14/22 134 lb 12.8 oz (61.1 kg)     GEN:  Well nourished, well developed in no acute distress HEENT: Normal NECK: No JVD; No carotid bruits CARDIAC: RRR, no murmurs, rubs, gallops RESPIRATORY:  Clear to auscultation without rales, wheezing or rhonchi  ABDOMEN: Soft, non-tender, non-distended MUSCULOSKELETAL:  No edema; No deformity. 2+ pedal pulses, equal bilaterally SKIN: Warm and dry NEUROLOGIC:  Alert and oriented x 3 PSYCHIATRIC:  Normal affect   EKG:  EKG is not ordered today   Diagnoses:    1. Coronary artery disease involving native coronary artery of native heart without angina pectoris   2. Left bundle branch block   3. Bilateral carotid artery stenosis   4. Chronic HFrEF (heart failure with reduced ejection fraction) (HCC)   5. Cardiomyopathy, unspecified type (HCC)   6. Mixed dyslipidemia     Assessment and Plan:     Cardiomyopathy/Chronic HFrEF: NYHA Class II. LVEF 30-35% on TTE 02/03/22. Is walking on occasion for exercise. No shortness of breath, orthopnea, PND, DOE, edema or weight gain.  Appears euvolemic on exam. I received a message from the clinical pharmacist at PCP office that patient may not be taking lisinopril. She called back to report she was not taking lisinopril.  I think this was inadvertently omitted.  Will have her resume lisinopril 10 mg daily.  Will  get BMP in 2 weeks.  Had hyperkalemia with spironolactone. Amlodipine was resumed by PCP.   CAD without angina: History of remote CABG. She denies chest pain, dyspnea, or other symptoms concerning for angina. No indication for further ischemic evaluation at this time. Is walking more for exercise. Has hypertriglyceridemia on lipid panel 11/05/22.  As noted below, we discussed potential dietary changes.  No bleeding concerns.  Continue aspirin, atorvastatin, lisinopril, amlodipine.  Carotid artery disease: Minimal stenosis bilaterally on carotid ultrasound 06/2020. She is asymptomatic. We will continue to monitor clinically at this time.   LBBB: Known. Echo 01/2022 with global hypokinesis, abnormal. Abnormal paradoxical septal motion consistent with left bundle branch block. Per Dr. Anne Fu, primary cardiologist, will continue to monitor clinically at this time. She is feeling well with no concerning symptoms of worsening LV dysfunction. Consider EP referral if symptoms worsen.   Dyslipidemia: LDL 74, triglycerides 236 on 11/05/22.  Lengthy discussion about limiting simple carbohydrates and sugar.  She admits to eating a lot of potatoes and sweets. Continue atorvastatin.   Disposition: 6 months with Dr. Anne Fu  Medication Adjustments/Labs and Tests Ordered: Current medicines are reviewed at length with the patient today.  Concerns regarding medicines are outlined above.  No orders of the defined types were placed in this encounter.  No orders of the defined types were placed in this encounter.   Patient Instructions  Medication Instructions:   Your physician recommends that you continue on your  current medications as directed. Please refer to the Current Medication list given to you today.   *If you need a refill on your cardiac medications before your next appointment, please call your pharmacy*   Lab Work:  None ordered.  If you have labs (blood work) drawn today and your tests are  completely normal, you will receive your results only by: MyChart Message (if you have MyChart) OR A paper copy in the mail If you have any lab test that is abnormal or we need to change your treatment, we will call you to review the results.   Testing/Procedures:  None ordered.   Follow-Up: At [facility], you and your health needs are our priority.  As part of our continuing mission to provide you with exceptional heart care, we have created designated Provider Care Teams.  These Care Teams include your primary Cardiologist (physician) and Advanced Practice Providers (APPs -  Physician Assistants and Nurse Practitioners) who all work together to provide you with the care you need, when you need it.  We recommend signing up for the patient portal called "MyChart".  Sign up information is provided on this After Visit Summary.  MyChart is used to connect with patients for Virtual Visits (Telemedicine).  Patients are able to view lab/test results, encounter notes, upcoming appointments, etc.  Non-urgent messages can be sent to your provider as well.   To learn more about what you can do with MyChart, go to [email or web address].    Your next appointment:   6 month(s)  Provider:   Donato Schultz, MD     Other Instructions  Your physician wants you to follow-up in: 6 months.  You will receive a reminder letter in the mail two months in advance. If you don't receive a letter, please call our office to schedule the follow-up appointment.  Please call office @ [phone].  I Duwayne Heck) will be at this number today and tomorrow. Let us know if you are taking Lisinopril.      Signed, Levi Aland, NP  11/17/2022 4:26 PM    [facility]

## 2022-11-11 ENCOUNTER — Telehealth: Payer: Self-pay | Admitting: Pharmacist

## 2022-11-11 NOTE — Progress Notes (Signed)
Pharmacy Quality Measure Review  This patient is appearing on a report for being at risk of failing the adherence measure for hypertension (ACEi/ARB) medications this calendar year.   Medication: lisinopril 10 mg Last fill date: 08/12/22 for 30 day supply  Will notify cardiology prior to upcoming appointment.   Catie Eppie Gibson, PharmD, BCACP, CPP Clinical Pharmacist [facility] [phone]

## 2022-11-14 ENCOUNTER — Other Ambulatory Visit: Payer: PPO

## 2022-11-17 ENCOUNTER — Encounter: Payer: Self-pay | Admitting: Nurse Practitioner

## 2022-11-17 ENCOUNTER — Telehealth: Payer: Self-pay | Admitting: *Deleted

## 2022-11-17 ENCOUNTER — Ambulatory Visit: Payer: PPO | Attending: Nurse Practitioner | Admitting: Nurse Practitioner

## 2022-11-17 VITALS — BP 128/64 | HR 83 | Ht 62.5 in | Wt 137.4 lb

## 2022-11-17 DIAGNOSIS — E782 Mixed hyperlipidemia: Secondary | ICD-10-CM

## 2022-11-17 DIAGNOSIS — I429 Cardiomyopathy, unspecified: Secondary | ICD-10-CM

## 2022-11-17 DIAGNOSIS — I6523 Occlusion and stenosis of bilateral carotid arteries: Secondary | ICD-10-CM | POA: Diagnosis not present

## 2022-11-17 DIAGNOSIS — I5022 Chronic systolic (congestive) heart failure: Secondary | ICD-10-CM

## 2022-11-17 DIAGNOSIS — I2510 Atherosclerotic heart disease of native coronary artery without angina pectoris: Secondary | ICD-10-CM | POA: Diagnosis not present

## 2022-11-17 DIAGNOSIS — I447 Left bundle-branch block, unspecified: Secondary | ICD-10-CM

## 2022-11-17 NOTE — Telephone Encounter (Signed)
Pt calling in to state no longer taking lisinopril. Medication list updated.

## 2022-11-17 NOTE — Patient Instructions (Signed)
Medication Instructions:   Your physician recommends that you continue on your current medications as directed. Please refer to the Current Medication list given to you today.   *If you need a refill on your cardiac medications before your next appointment, please call your pharmacy*   Lab Work:  None ordered.  If you have labs (blood work) drawn today and your tests are completely normal, you will receive your results only by: MyChart Message (if you have MyChart) OR A paper copy in the mail If you have any lab test that is abnormal or we need to change your treatment, we will call you to review the results.   Testing/Procedures:  None ordered.   Follow-Up: At [facility], you and your health needs are our priority.  As part of our continuing mission to provide you with exceptional heart care, we have created designated Provider Care Teams.  These Care Teams include your primary Cardiologist (physician) and Advanced Practice Providers (APPs -  Physician Assistants and Nurse Practitioners) who all work together to provide you with the care you need, when you need it.  We recommend signing up for the patient portal called "MyChart".  Sign up information is provided on this After Visit Summary.  MyChart is used to connect with patients for Virtual Visits (Telemedicine).  Patients are able to view lab/test results, encounter notes, upcoming appointments, etc.  Non-urgent messages can be sent to your provider as well.   To learn more about what you can do with MyChart, go to [email or web address].    Your next appointment:   6 month(s)  Provider:   Donato Schultz, MD     Other Instructions  Your physician wants you to follow-up in: 6 months.  You will receive a reminder letter in the mail two months in advance. If you don't receive a letter, please call our office to schedule the follow-up appointment.  Please call office @ [phone].  I Duwayne Heck) will be at this  number today and tomorrow. Let us know if you are taking Lisinopril.

## 2022-11-18 ENCOUNTER — Other Ambulatory Visit: Payer: Self-pay | Admitting: *Deleted

## 2022-11-18 ENCOUNTER — Telehealth: Payer: Self-pay | Admitting: *Deleted

## 2022-11-18 DIAGNOSIS — I6523 Occlusion and stenosis of bilateral carotid arteries: Secondary | ICD-10-CM

## 2022-11-18 DIAGNOSIS — I2510 Atherosclerotic heart disease of native coronary artery without angina pectoris: Secondary | ICD-10-CM

## 2022-11-18 DIAGNOSIS — I5022 Chronic systolic (congestive) heart failure: Secondary | ICD-10-CM

## 2022-11-18 DIAGNOSIS — I447 Left bundle-branch block, unspecified: Secondary | ICD-10-CM

## 2022-11-18 DIAGNOSIS — I429 Cardiomyopathy, unspecified: Secondary | ICD-10-CM

## 2022-11-18 MED ORDER — LISINOPRIL 10 MG PO TABS: 10.0000 mg | ORAL_TABLET | Freq: Every day | ORAL | 11 refills | Status: DC

## 2022-11-18 NOTE — Telephone Encounter (Signed)
S/w pt is aware to start Lisinopril one (1) tablet by mouth ( 10 mg) daily, sent in to requested pharmacy # [id number] to [facility]. Pt will come in on Aug 23 for repeat bmet, appt made, orders in and linked.

## 2022-11-18 NOTE — Telephone Encounter (Signed)
-----   Message from Levi Aland sent at 11/17/2022  4:14 PM EDT ----- Upon further thought, please have her resume lisinopril 10 mg daily. She needs a BMET in 2 weeks.

## 2022-11-27 ENCOUNTER — Other Ambulatory Visit: Payer: Self-pay | Admitting: Nurse Practitioner

## 2022-12-05 ENCOUNTER — Ambulatory Visit: Payer: PPO | Attending: Interventional Cardiology

## 2022-12-05 DIAGNOSIS — I447 Left bundle-branch block, unspecified: Secondary | ICD-10-CM

## 2022-12-05 DIAGNOSIS — I6523 Occlusion and stenosis of bilateral carotid arteries: Secondary | ICD-10-CM | POA: Diagnosis not present

## 2022-12-05 DIAGNOSIS — I429 Cardiomyopathy, unspecified: Secondary | ICD-10-CM | POA: Diagnosis not present

## 2022-12-05 DIAGNOSIS — I5022 Chronic systolic (congestive) heart failure: Secondary | ICD-10-CM | POA: Diagnosis not present

## 2022-12-05 DIAGNOSIS — I2510 Atherosclerotic heart disease of native coronary artery without angina pectoris: Secondary | ICD-10-CM | POA: Diagnosis not present

## 2022-12-06 LAB — BASIC METABOLIC PANEL
BUN/Creatinine Ratio: 10 — ABNORMAL LOW (ref 12–28)
BUN: 16 mg/dL (ref 8–27)
CO2: 19 mmol/L — ABNORMAL LOW (ref 20–29)
Calcium: 9 mg/dL (ref 8.7–10.3)
Chloride: 107 mmol/L — ABNORMAL HIGH (ref 96–106)
Creatinine, Ser: 1.53 mg/dL — ABNORMAL HIGH (ref 0.57–1.00)
Glucose: 211 mg/dL — ABNORMAL HIGH (ref 70–99)
Potassium: 5 mmol/L (ref 3.5–5.2)
Sodium: 141 mmol/L (ref 134–144)
eGFR: 34 mL/min/{1.73_m2} — ABNORMAL LOW (ref >59)

## 2022-12-09 ENCOUNTER — Ambulatory Visit (INDEPENDENT_AMBULATORY_CARE_PROVIDER_SITE_OTHER): Payer: PPO | Admitting: Family Medicine

## 2022-12-09 ENCOUNTER — Ambulatory Visit
Admission: RE | Admit: 2022-12-09 | Discharge: 2022-12-09 | Disposition: A | Discharge status: Home / Self Care | Payer: PPO | Source: Ambulatory Visit | Attending: Family Medicine | Admitting: Family Medicine

## 2022-12-09 ENCOUNTER — Encounter: Payer: Self-pay | Admitting: Family Medicine

## 2022-12-09 VITALS — BP 122/64 | HR 85 | Temp 97.8°F | Ht 62.5 in | Wt 137.4 lb

## 2022-12-09 DIAGNOSIS — S6992XA Unspecified injury of left wrist, hand and finger(s), initial encounter: Secondary | ICD-10-CM

## 2022-12-09 DIAGNOSIS — M25532 Pain in left wrist: Secondary | ICD-10-CM | POA: Diagnosis not present

## 2022-12-09 DIAGNOSIS — Z87440 Personal history of urinary (tract) infections: Secondary | ICD-10-CM | POA: Diagnosis not present

## 2022-12-09 DIAGNOSIS — M7989 Other specified soft tissue disorders: Secondary | ICD-10-CM | POA: Diagnosis not present

## 2022-12-09 DIAGNOSIS — M1812 Unilateral primary osteoarthritis of first carpometacarpal joint, left hand: Secondary | ICD-10-CM | POA: Diagnosis not present

## 2022-12-09 DIAGNOSIS — S6992XD Unspecified injury of left wrist, hand and finger(s), subsequent encounter: Secondary | ICD-10-CM | POA: Insufficient documentation

## 2022-12-09 DIAGNOSIS — M8580 Other specified disorders of bone density and structure, unspecified site: Secondary | ICD-10-CM | POA: Diagnosis not present

## 2022-12-09 NOTE — Assessment & Plan Note (Signed)
Pt has been more off balance lately but no urinary symptoms  Tried to give sample today- unable  Will bring back urinalysis   Encouraged her to keep up fluid intake

## 2022-12-09 NOTE — Progress Notes (Signed)
Subjective:    Patient ID: Kathleen Williamson, female    DOB: [birth year], 82 y.o.   MRN: [id number]  HPI  Wt Readings from Last 3 Encounters:  12/09/22 137 lb 6 oz (62.3 kg)  11/17/22 137 lb 6.4 oz (62.3 kg)  09/19/22 133 lb 4 oz (60.4 kg)   24.73 kg/m  Vitals:   12/09/22 1203  BP: 122/64  Pulse: 85  Temp: 97.8 F (36.6 C)  SpO2: 98%    82 yo pt of Dr Reece Agar presents with history of uti  and wrist pain from fall Lives at [facility] -has a lot of help    Had a uti in 2022  -no symptoms (tends to have no symptoms but altered mental status)  Balance got worse recently so worried she may have another one   Drinking water -tries to make a habit of drinking water through the day   No urinary symptoms today  No burning or blood    Fell on Saturday - in hallway at her residence (made a turn on the carpet / may have tripped over cat)  Fell on left side , scrape on knee  Is scraped on right side  Swollen left wrist - was very sore Sunday and improved today  Hurts to flex it   Took tylenol and went back to her room   Tetanus shot is up to date   Xray wirst  DG Wrist Complete Left  Result Date: 12/09/2022 CLINICAL DATA:  pain and swelling in left wrist after a fall on saturday EXAM: LEFT WRIST - COMPLETE 3+ VIEW COMPARISON:  07/10/2021 FINDINGS: No fracture or dislocation. Advanced DJD at the first [facility] joint with subchondral sclerosis, cartilage narrowing, and some fragmentation as before. Mild DJD at the STT articulation. Diffuse osteopenia. Carpal rows intact. Chondrocalcinosis in the TFCC as before. IMPRESSION: 1. No fracture or other acute findings. 2. Advanced DJD at the first [facility] joint. Electronically Signed   By: Corlis Leak M.D.   On: 12/09/2022 13:50     She has history of erosive OA of both hands and also osteoporosis (taking prolia)  Sees Dr Stephenie Acres in [place]  Has history of memory impairment and MDD  Patient Active Problem List   Diagnosis Date Noted   Wrist  injury, left, initial encounter 12/09/2022   History of UTI 12/09/2022   DNR (do not resuscitate) 09/19/2022   LBBB (left bundle branch block) 01/17/2022   Chronic HFrEF (heart failure with reduced ejection fraction) (HCC) 01/17/2022   Low serum vitamin B12 09/21/2021   Positive ANA (antinuclear antibody) 08/24/2021   Bilateral carpal tunnel syndrome 08/22/2021   Erosive osteoarthritis of both hands 06/18/2021   Memory impairment 01/16/2021   Hypertension 12/11/2020   Status post right hip replacement 11/14/2020   Complicated UTI (urinary tract infection) 10/31/2020   Sensorineural hearing loss, bilateral 07/07/2020   Osteoarthritis of right hip 09/17/2019   Recurrent falls 09/15/2017   Tongue lesion 02/12/2017   Complicated grief 10/21/2016   Health maintenance examination 02/07/2016   Baker's cyst of knee 08/01/2015   Diabetes mellitus type 2, controlled, with complications (HCC) 02/11/2015   Subclavian steal syndrome    Medicare annual wellness visit, subsequent 01/31/2015   Advanced care planning/counseling discussion 01/31/2015   Carotid stenosis 01/31/2015   Vertebral artery stenosis/occlusion 01/31/2015   CKD stage 3 due to type 2 diabetes mellitus (HCC) 01/21/2015   Microscopic colitis 10/26/2014   Osteoporosis    Hypothyroidism    Dermatomyositis (HCC)  MDD (major depressive disorder), recurrent severe, without psychosis (HCC) 01/12/2014   Atherosclerosis of native coronary artery of native heart without angina pectoris 12/13/2013   Hyperlipidemia associated with type 2 diabetes mellitus (HCC) 12/13/2013   Pseudocyst of pancreas 08/29/2011   Past Medical History:  Diagnosis Date   Arthritis    fingers   CAD (coronary artery disease) 2003   MI s/p 5v CABG   Carotid stenosis    Skains   CKD (chronic kidney disease) stage 3, GFR 30-59 ml/min (HCC)    Complicated UTI (urinary tract infection) 10/31/2020   Dermatomyositis (HCC)    saw Dr Corliss Skains, improved on its  own   Diabetes mellitus without complication (HCC)    Forehead laceration 09/15/2017   History of chicken pox    History of measles    History of migraine none since 2003   HLD (hyperlipidemia)    Hypothyroidism    MDD (major depressive disorder), recurrent episode, moderate (HCC)    h/o SI after lost husband unexpectedly 2015   Microscopic colitis 05/2014   by colonoscopy, lymphocytic, zoloft related   Myocardial infarction (HCC) 2003   Osteoporosis    DEXA 03/2013 T -3.1, DEXA 10//2016 -2.9 hip, -2.2 spine   Pancreatic cyst    Dr Arline Asp, no f/u needed   Prediabetes 2014   Subclavian steal syndrome    Urine incontinence    Past Surgical History:  Procedure Laterality Date   APPENDECTOMY  1997   with hysterectomy   BACK SURGERY  1981   lower   BREAST LUMPECTOMY Left 1977   benign   COLONOSCOPY WITH PROPOFOL N/A 05/23/2014   microscopic colitis - Charolett Bumpers, MD   CORONARY ARTERY BYPASS GRAFT  2003   x 5 v   ESOPHAGOGASTRODUODENOSCOPY (EGD) WITH PROPOFOL N/A 05/23/2014   WNL Charolett Bumpers, MD   ROTATOR CUFF REPAIR Right [phone]   TOOTH EXTRACTION  yrs ago   TOTAL ABDOMINAL HYSTERECTOMY W/ BILATERAL SALPINGOOPHORECTOMY  1997   endometriosis   TOTAL HIP ARTHROPLASTY Right 11/14/2020   Procedure: TOTAL HIP ARTHROPLASTY ANTERIOR APPROACH;  Surgeon: Ollen Gross, MD;  Location: [facility];  Service: Orthopedics;  Laterality: Right;   Social History   Tobacco Use   Smoking status: Never   Smokeless tobacco: Never  Vaping Use   Vaping status: Never Used  Substance Use Topics   Alcohol use: No   Drug use: No   Family History  Problem Relation Age of Onset   CAD Father 11   CAD Mother 11   Diabetes Mother    Rheum arthritis Mother    Cancer Maternal Aunt        breast   CAD Other        strong paternal side   Stroke Neg Hx    Allergies  Allergen Reactions   Sulfa Antibiotics Nausea Only   Crestor [Rosuvastatin Calcium] Other (See Comments)    Leg ache    Current Outpatient Medications on File Prior to Visit  Medication Sig Dispense Refill   Accu-Chek FastClix Lancets MISC USE AS INSTRUCTED TO CHECK BLOOD SUGAR ONCE DAILY 102 each 1   acetaminophen (TYLENOL) 325 MG tablet Take 650 mg by mouth every 6 (six) hours as needed for moderate pain or headache.     amLODipine (NORVASC) 2.5 MG tablet TAKE ONE TABLET BY MOUTH EVERY MORNING 90 tablet 4   aspirin EC 81 MG tablet Take 81 mg by mouth daily. Swallow whole.     atorvastatin (  LIPITOR) 40 MG tablet TAKE ONE TABLET BY MOUTH EVERYDAY AT BEDTIME 90 tablet 3   Blood Glucose Monitoring Suppl (ACCU-CHEK GUIDE) w/Device KIT 1 each by Does not apply route as directed. Use as instructed to check blood sugar once daily 1 kit 0   Cholecalciferol (VITAMIN D3) 25 MCG (1000 UT) CAPS Take 2 capsules (2,000 Units total) by mouth daily. 30 capsule    Coenzyme Q10 100 MG capsule Take 100 mg by mouth daily.     cyanocobalamin (VITAMIN B12) 1000 MCG tablet Take 1 tablet (1,000 mcg total) by mouth once a week.     denosumab (PROLIA) 60 MG/ML SOSY injection Inject 60 mg into the skin every 6 (six) months.     ezetimibe (ZETIA) 10 MG tablet Take 1 tablet (10 mg total) by mouth daily. 90 tablet 3   glucose blood (ONETOUCH VERIO) test strip Use as instructed to check blood sugar once a day 100 each 3   levothyroxine (SYNTHROID) 50 MCG tablet TAKE ONE TABLET BY MOUTH ONCE DAILY 90 tablet 1   lisinopril (ZESTRIL) 10 MG tablet Take 1 tablet (10 mg total) by mouth daily. 30 tablet 11   loperamide (IMODIUM A-D) 2 MG tablet Take 2 mg by mouth 4 (four) times daily as needed for diarrhea or loose stools.     mirtazapine (REMERON) 15 MG tablet TAKE ONE TABLET BY MOUTH EVERYDAY AT BEDTIME 30 tablet 6   nitroGLYCERIN (NITROLINGUAL) 0.4 MG/SPRAY spray Place 1 spray under the tongue every 5 (five) minutes as needed. 12 g prn   venlafaxine XR (EFFEXOR-XR) 75 MG 24 hr capsule TAKE THREE CAPSULES BY MOUTH EVERY MORNING 270 capsule 4    No current facility-administered medications on file prior to visit.    Review of Systems  Constitutional:  Positive for fatigue. Negative for activity change, appetite change, fever and unexpected weight change.       Occational fatigue   HENT:  Negative for congestion, ear pain, rhinorrhea, sinus pressure and sore throat.   Eyes:  Negative for pain, redness and visual disturbance.  Respiratory:  Negative for cough, shortness of breath and wheezing.   Cardiovascular:  Negative for chest pain and palpitations.  Gastrointestinal:  Negative for abdominal pain, blood in stool, constipation and diarrhea.  Endocrine: Negative for polydipsia and polyuria.  Genitourinary:  Negative for dysuria, frequency and urgency.  Musculoskeletal:  Positive for arthralgias. Negative for back pain and myalgias.  Skin:  Negative for pallor and rash.  Allergic/Immunologic: Negative for environmental allergies.  Neurological:  Negative for dizziness, syncope and headaches.       Poor balance at times   Hematological:  Negative for adenopathy. Does not bruise/bleed easily.  Psychiatric/Behavioral:  Negative for decreased concentration and dysphoric mood. The patient is not nervous/anxious.        Objective:   Physical Exam Constitutional:      General: She is not in acute distress.    Appearance: Normal appearance. She is well-developed and normal weight. She is not ill-appearing or diaphoretic.  HENT:     Head: Normocephalic and atraumatic.  Eyes:     Conjunctiva/sclera: Conjunctivae normal.     Pupils: Pupils are equal, round, and reactive to light.  Neck:     Thyroid: No thyromegaly.     Vascular: No carotid bruit or JVD.  Cardiovascular:     Rate and Rhythm: Normal rate and regular rhythm.     Heart sounds: Normal heart sounds.     No gallop.  Pulmonary:     Effort: Pulmonary effort is normal. No respiratory distress.     Breath sounds: Normal breath sounds. No wheezing or rales.   Abdominal:     General: There is no distension or abdominal bruit.     Palpations: Abdomen is soft.     Tenderness: There is no right CVA tenderness or left CVA tenderness.     Comments: No suprapubic tenderness or fullness    Musculoskeletal:     Cervical back: Normal range of motion and neck supple.     Right lower leg: No edema.     Left lower leg: No edema.     Comments: Left wrist is mildly swollen dorsally with some arthritis deformity in wrist and hand  Pain to fully extend  Some pain to flex Normal lateral movement and pronation/supination  No point tenderness No crepitus  Perfusion intact  No skin changes   Small abrasion right arm and left knee  No signs and symptoms of infection   Lymphadenopathy:     Cervical: No cervical adenopathy.  Skin:    General: Skin is warm and dry.     Coloration: Skin is not pale.     Findings: No rash.  Neurological:     Mental Status: She is alert.     Coordination: Coordination normal.     Deep Tendon Reflexes: Reflexes are normal and symmetric. Reflexes normal.  Psychiatric:        Mood and Affect: Mood normal.           Assessment & Plan:   Problem List Items Addressed This Visit       Genitourinary   History of UTI    Pt has been more off balance lately but no urinary symptoms  Tried to give sample today- unable  Will bring back urinalysis   Encouraged her to keep up fluid intake         Other   Wrist injury, left, initial encounter - Primary    Had a fall last Saturday- caught herself on door/wall with left nand  Some swelling and soreness  Most pain is with extension  Mildly tender  Per pt overall improved from Sunday  Xray today : no fractures-does have advanced DJD Encouraged use of ice Compression prn  Avoid activities that hurt  Update if not starting to improve in a week or if worsening        Relevant Orders   DG Wrist Complete Left (Completed)

## 2022-12-09 NOTE — Assessment & Plan Note (Addendum)
Had a fall last Saturday- caught herself on door/wall with left nand  Some swelling and soreness  Most pain is with extension  Mildly tender  Per pt overall improved from Sunday  Xray today : no fractures-does have advanced DJD Encouraged use of ice Compression prn  Avoid activities that hurt  Update if not starting to improve in a week or if worsening

## 2022-12-09 NOTE — Patient Instructions (Signed)
Let's check a wrist xray today  We will call with result   Use ice / cold pack when you can  Compression wrap is ok  Avoid activities that hurt   Tylenol for pain if needed    Let's get a urinalysis when you can give a sample  We will call with a result and plan  Take care of yourself

## 2022-12-10 LAB — POC URINALSYSI DIPSTICK (AUTOMATED)
Bilirubin, UA: NEGATIVE
Blood, UA: NEGATIVE
Glucose, UA: NEGATIVE
Ketones, UA: NEGATIVE
Leukocytes, UA: NEGATIVE
Nitrite, UA: NEGATIVE
Protein, UA: NEGATIVE
Spec Grav, UA: 1.01 (ref 1.010–1.025)
Urobilinogen, UA: 0.2 E.U./dL
pH, UA: 6 (ref 5.0–8.0)

## 2022-12-10 NOTE — Addendum Note (Signed)
Addended by: Shon Millet on: 12/10/2022 02:11 PM   Modules accepted: Orders

## 2022-12-17 ENCOUNTER — Other Ambulatory Visit: Payer: Self-pay | Admitting: Family Medicine

## 2022-12-17 DIAGNOSIS — E039 Hypothyroidism, unspecified: Secondary | ICD-10-CM

## 2022-12-22 ENCOUNTER — Encounter: Payer: Self-pay | Admitting: Family Medicine

## 2022-12-22 ENCOUNTER — Ambulatory Visit (INDEPENDENT_AMBULATORY_CARE_PROVIDER_SITE_OTHER): Payer: PPO | Admitting: Family Medicine

## 2022-12-22 VITALS — BP 126/68 | HR 88 | Temp 97.3°F | Ht 62.5 in | Wt 137.1 lb

## 2022-12-22 DIAGNOSIS — N183 Chronic kidney disease, stage 3 unspecified: Secondary | ICD-10-CM | POA: Diagnosis not present

## 2022-12-22 DIAGNOSIS — S6992XD Unspecified injury of left wrist, hand and finger(s), subsequent encounter: Secondary | ICD-10-CM

## 2022-12-22 DIAGNOSIS — I10 Essential (primary) hypertension: Secondary | ICD-10-CM

## 2022-12-22 DIAGNOSIS — Z7984 Long term (current) use of oral hypoglycemic drugs: Secondary | ICD-10-CM

## 2022-12-22 DIAGNOSIS — E118 Type 2 diabetes mellitus with unspecified complications: Secondary | ICD-10-CM

## 2022-12-22 DIAGNOSIS — K863 Pseudocyst of pancreas: Secondary | ICD-10-CM

## 2022-12-22 DIAGNOSIS — M898X1 Other specified disorders of bone, shoulder: Secondary | ICD-10-CM | POA: Insufficient documentation

## 2022-12-22 DIAGNOSIS — E1122 Type 2 diabetes mellitus with diabetic chronic kidney disease: Secondary | ICD-10-CM | POA: Diagnosis not present

## 2022-12-22 LAB — POCT GLYCOSYLATED HEMOGLOBIN (HGB A1C): Hemoglobin A1C: 7.7 % — AB (ref 4.0–5.6)

## 2022-12-22 MED ORDER — SITAGLIPTIN PHOSPHATE 25 MG PO TABS: 25.0000 mg | ORAL_TABLET | Freq: Every day | ORAL | 9 refills | Status: DC

## 2022-12-22 NOTE — Assessment & Plan Note (Signed)
Chronic, BP stable on current regimen - continue

## 2022-12-22 NOTE — Patient Instructions (Addendum)
Flu shot and COVID shot through [facility] I think you may have had a bony bruise of shoulder blade. Let us know if not improving over time.  I will order a kidney ultrasound for chronic kidney disease.  A1c was worse today - trial [place] 25mg  daily.  Good to see you today  Return in 3 months for follow up visit

## 2022-12-22 NOTE — Assessment & Plan Note (Signed)
This is significantly better.

## 2022-12-22 NOTE — Progress Notes (Signed)
Ph: [phone] Fax: [phone]   Patient ID: [name], female    DOB: April 09, 1941, 82 y.o.   MRN: [id number]  This visit was conducted in person.  BP 126/68   Pulse 88   Temp (!) 97.3 F (36.3 C) (Temporal)   Ht 5' 2.5" (1.588 m)   Wt 137 lb 2 oz (62.2 kg)   LMP  (LMP Unknown)   SpO2 100%   BMI 24.68 kg/m    CC: DM f/u visit  Subjective:   HPI: ALDONIA Williamson is a 82 y.o. female presenting on 12/22/2022 for Medical Management of Chronic Issues (Here for 3 mo DM f/u.)   Just found out this morning that cousin died.   Fall on outstretched hand 2 wks ago - saw Dr Milinda Antis - xray didn't show fracture but did show advanced DJD at first [facility] joint on left.  Wrist is feeling better but now having left shoulder pain. Pain at posterior shoulder blade, fell against wall. Tennis shoes tripped on carpet. Treating pain with tylenol with temporary benefit. Pain with movement, better with rest. She tried friend's salon pas patches with benefit but she noted bradycardia with this.  Known h/o erosive osteoarthritis of both hands followed by [facility] Stephenie Acres)  DM - does regularly check sugars - fasting 140-150s. Compliant with antihyperglycemic regimen which includes: diet controlled. Stopped farxiga due to concerns over recurrent yeast infection. Denies low sugars or hypoglycemic symptoms. Denies paresthesias, blurry vision. Last diabetic eye exam 06/2022. Glucometer brand: accuchek. Last foot exam: DUE. DSME: completed at [facility] 05/2017.  Lab Results  Component Value Date   HGBA1C 7.7 (A) 12/22/2022   Diabetic Foot Exam - Simple   Simple Foot Form Diabetic Foot exam was performed with the following findings: Yes 12/22/2022 11:32 AM  Visual Inspection No deformities, no ulcerations, no other skin breakdown bilaterally: Yes Sensation Testing Intact to touch and monofilament testing bilaterally: Yes Pulse Check Posterior Tibialis and Dorsalis pulse intact bilaterally:  Yes Comments No claudication    Lab Results  Component Value Date   MICROALBUR <0.7 09/19/2022    Sees cardiology for CAD s/p 5v CABG (2003), carotid disease, LBBB and cardiomyopathy with sCHF, reduced EF to 30-35% (global hypokinesis).       Relevant past medical, surgical, family and social history reviewed and updated as indicated. Interim medical history since our last visit reviewed. Allergies and medications reviewed and updated. Outpatient Medications Prior to Visit  Medication Sig Dispense Refill   Accu-Chek FastClix Lancets MISC USE AS INSTRUCTED TO CHECK BLOOD SUGAR ONCE DAILY 102 each 1   acetaminophen (TYLENOL) 325 MG tablet Take 650 mg by mouth every 6 (six) hours as needed for moderate pain or headache.     amLODipine (NORVASC) 2.5 MG tablet TAKE ONE TABLET BY MOUTH EVERY MORNING 90 tablet 4   aspirin EC 81 MG tablet Take 81 mg by mouth daily. Swallow whole.     atorvastatin (LIPITOR) 40 MG tablet TAKE ONE TABLET BY MOUTH EVERYDAY AT BEDTIME 90 tablet 3   Blood Glucose Monitoring Suppl (ACCU-CHEK GUIDE) w/Device KIT 1 each by Does not apply route as directed. Use as instructed to check blood sugar once daily 1 kit 0   Cholecalciferol (VITAMIN D3) 25 MCG (1000 UT) CAPS Take 2 capsules (2,000 Units total) by mouth daily. 30 capsule    Coenzyme Q10 100 MG capsule Take 100 mg by mouth daily.     cyanocobalamin (VITAMIN B12) 1000 MCG tablet Take 1  tablet (1,000 mcg total) by mouth once a week.     denosumab (PROLIA) 60 MG/ML SOSY injection Inject 60 mg into the skin every 6 (six) months.     ezetimibe (ZETIA) 10 MG tablet Take 1 tablet (10 mg total) by mouth daily. 90 tablet 3   glucose blood (ONETOUCH VERIO) test strip Use as instructed to check blood sugar once a day 100 each 3   levothyroxine (SYNTHROID) 50 MCG tablet TAKE 1 TABLET BY MOUTH ONCE DAILY *REFILL REQUEST* 90 tablet 3   lisinopril (ZESTRIL) 10 MG tablet Take 1 tablet (10 mg total) by mouth daily. 30 tablet 11    loperamide (IMODIUM A-D) 2 MG tablet Take 2 mg by mouth 4 (four) times daily as needed for diarrhea or loose stools.     mirtazapine (REMERON) 15 MG tablet TAKE ONE TABLET BY MOUTH EVERYDAY AT BEDTIME 30 tablet 6   nitroGLYCERIN (NITROLINGUAL) 0.4 MG/SPRAY spray Place 1 spray under the tongue every 5 (five) minutes as needed. 12 g prn   venlafaxine XR (EFFEXOR-XR) 75 MG 24 hr capsule TAKE THREE CAPSULES BY MOUTH EVERY MORNING 270 capsule 4   No facility-administered medications prior to visit.     Per HPI unless specifically indicated in ROS section below Review of Systems  Objective:  BP 126/68   Pulse 88   Temp (!) 97.3 F (36.3 C) (Temporal)   Ht 5' 2.5" (1.588 m)   Wt 137 lb 2 oz (62.2 kg)   LMP  (LMP Unknown)   SpO2 100%   BMI 24.68 kg/m   Wt Readings from Last 3 Encounters:  12/22/22 137 lb 2 oz (62.2 kg)  12/09/22 137 lb 6 oz (62.3 kg)  11/17/22 137 lb 6.4 oz (62.3 kg)      Physical Exam Vitals and nursing note reviewed.  Constitutional:      Appearance: Normal appearance. She is not ill-appearing.  Eyes:     Extraocular Movements: Extraocular movements intact.     Conjunctiva/sclera: Conjunctivae normal.     Pupils: Pupils are equal, round, and reactive to light.  Cardiovascular:     Rate and Rhythm: Normal rate and regular rhythm.     Pulses: Normal pulses.     Heart sounds: Normal heart sounds. No murmur heard. Pulmonary:     Effort: Pulmonary effort is normal. No respiratory distress.     Breath sounds: Normal breath sounds. No wheezing, rhonchi or rales.  Musculoskeletal:     Right lower leg: No edema.     Left lower leg: No edema.     Comments: See HPI for foot exam if done  Skin:    General: Skin is warm and dry.     Findings: No rash.  Neurological:     Mental Status: She is alert.  Psychiatric:        Mood and Affect: Mood normal.        Behavior: Behavior normal.       Results for orders placed or performed in visit on 12/22/22  POCT  glycosylated hemoglobin (Hb A1C)  Result Value Ref Range   Hemoglobin A1C 7.7 (A) 4.0 - 5.6 %   HbA1c POC (<> result, manual entry)     HbA1c, POC (prediabetic range)     HbA1c, POC (controlled diabetic range)     Lab Results  Component Value Date   NA 141 12/05/2022   CL 107 (H) 12/05/2022   K 5.0 12/05/2022   CO2 19 (L) 12/05/2022  BUN 16 12/05/2022   CREATININE 1.53 (H) 12/05/2022   EGFR 34 (L) 12/05/2022   CALCIUM 9.0 12/05/2022   PHOS 4.1 09/15/2022   ALBUMIN 4.3 09/15/2022   GLUCOSE 211 (H) 12/05/2022   Assessment & Plan:   Problem List Items Addressed This Visit     Pseudocyst of pancreas    Last imaged by CT 2013.  Update abd [name] today.       Relevant Orders   US Abdomen Complete   CKD stage 3 due to type 2 diabetes mellitus (HCC)    SPEP normal 09/2022.  UA without proteinuria.  Update abd ultrasound.       Relevant Medications   sitaGLIPtin (JANUVIA) 25 MG tablet   Other Relevant Orders   US Abdomen Complete   Diabetes mellitus type 2, controlled, with complications (HCC) - Primary    Chronic, deteriorated control.  Avoid metformin in CKD. She declines SGLT2i given concern for recurrent infection.  Start [place] 25mg  daily. Handout provided today.  Foot exam today. UTD eye exam.       Relevant Medications   sitaGLIPtin (JANUVIA) 25 MG tablet   Other Relevant Orders   POCT glycosylated hemoglobin (Hb A1C) (Completed)   US Abdomen Complete   Hypertension    Chronic, BP stable on current regimen - continue.      Wrist injury, left, subsequent encounter    This is significantly better.       Pain of left scapula    Point tender to wing of scapula, pain started a few days after fall. Doubt fracture, anticipate bony contusion. Supportive measures reviewed. Update if not improving or worsening for xray.         Meds ordered this encounter  Medications   sitaGLIPtin (JANUVIA) 25 MG tablet    Sig: Take 1 tablet (25 mg total) by mouth daily.     Dispense:  30 tablet    Refill:  9    Orders Placed This Encounter  Procedures   US Abdomen Complete    Standing Status:   Future    Standing Expiration Date:   12/22/2023    Order Specific Question:   Reason for Exam (SYMPTOM  OR DIAGNOSIS REQUIRED)    Answer:   CKD, h/o pancreas pseudocyst    Order Specific Question:   Preferred imaging location?    Answer:   [facility]   POCT glycosylated hemoglobin (Hb A1C)    Patient Instructions  Flu shot and COVID shot through [facility] I think you may have had a bony bruise of shoulder blade. Let us know if not improving over time.  I will order a kidney ultrasound for chronic kidney disease.  A1c was worse today - trial [place] 25mg  daily.  Good to see you today  Return in 3 months for follow up visit   Follow up plan: Return in about 3 months (around 03/23/2023), or if symptoms worsen or fail to improve, for follow up visit.  Eustaquio Boyden, MD

## 2022-12-22 NOTE — Assessment & Plan Note (Signed)
Last imaged by CT 2013.  Update abd [name] today.

## 2022-12-22 NOTE — Assessment & Plan Note (Addendum)
Chronic, deteriorated control.  Avoid metformin in CKD. She declines SGLT2i given concern for recurrent infection.  Start [place] 25mg  daily. Handout provided today.  Foot exam today. UTD eye exam.

## 2022-12-22 NOTE — Assessment & Plan Note (Signed)
Point tender to wing of scapula, pain started a few days after fall. Doubt fracture, anticipate bony contusion. Supportive measures reviewed. Update if not improving or worsening for xray.

## 2022-12-22 NOTE — Assessment & Plan Note (Addendum)
SPEP normal 09/2022.  UA without proteinuria.  Update abd ultrasound.

## 2022-12-30 ENCOUNTER — Ambulatory Visit (INDEPENDENT_AMBULATORY_CARE_PROVIDER_SITE_OTHER): Payer: PPO

## 2022-12-30 DIAGNOSIS — Z23 Encounter for immunization: Secondary | ICD-10-CM

## 2023-01-03 ENCOUNTER — Encounter (HOSPITAL_COMMUNITY): Payer: Self-pay

## 2023-01-05 ENCOUNTER — Telehealth: Payer: Self-pay | Admitting: Cardiology

## 2023-01-05 NOTE — Telephone Encounter (Signed)
See telephone encounter dated 11/18/22. She was advised to take lisinopril 10 mg daily. She had follow-up BMET on 8/23 which was stable.

## 2023-01-05 NOTE — Telephone Encounter (Signed)
Pt c/o medication issue:  1. Name of Medication:  lisinopril (ZESTRIL) 10 MG tablet  2. How are you currently taking this medication (dosage and times per day)?   3. Are you having a reaction (difficulty breathing--STAT)?   4. What is your medication issue?   Patient would like to clarify whether or not she should still be taking Lisinopril. Please advise.

## 2023-01-05 NOTE — Telephone Encounter (Signed)
S/w pt has not been taking lisinopril X 2 months due to [employer] being bought out and this has not been in pt's med pack.  Pt added to the October med pack the lisinopril and will be take it on a regular basis.  Pt will keep a log of BP with wrist cuff and if bp gets to low or pt feels dizzy or lightheaded will call office. Pt will bring cuff to next ov to get check.  Advised with Marcelino Duster.

## 2023-01-23 ENCOUNTER — Other Ambulatory Visit: Payer: Self-pay | Admitting: Family Medicine

## 2023-04-09 ENCOUNTER — Other Ambulatory Visit (HOSPITAL_COMMUNITY): Payer: Self-pay

## 2023-04-20 ENCOUNTER — Other Ambulatory Visit: Payer: Self-pay

## 2023-04-20 ENCOUNTER — Telehealth: Payer: Self-pay

## 2023-04-20 NOTE — Telephone Encounter (Signed)
 Copied from CRM [id number]. Topic: Clinical - Medication Question >> Apr 20, 2023 12:06 PM Kathleen Williamson wrote: Reason for CRM: Patient is requesting to speak with [facility] in regard to her medication sitaGLIPtin  (JANUVIA ) 25 MG tablet - states the pharmacy told her theres Williamson mix up with the company who makes it, they have gotten 2 shipments in but her medication was in neither one.

## 2023-04-21 NOTE — Telephone Encounter (Signed)
 Spoke with [facility] asking about pt's rx. States rx was filled today and pt has picked it up.

## 2023-04-24 ENCOUNTER — Other Ambulatory Visit: Payer: Self-pay

## 2023-04-27 ENCOUNTER — Other Ambulatory Visit: Payer: Self-pay

## 2023-05-08 ENCOUNTER — Telehealth: Payer: Self-pay | Admitting: Family Medicine

## 2023-05-08 DIAGNOSIS — K52839 Microscopic colitis, unspecified: Secondary | ICD-10-CM

## 2023-05-08 NOTE — Telephone Encounter (Signed)
Copied from CRM [id number]. Topic: Clinical - Medical Advice >> May 08, 2023  1:53 PM Kathryne Eriksson wrote: Reason for CRM: Patient Requesting Call Back >> May 08, 2023  1:56 PM Kathryne Eriksson wrote: Patient states she sent over a message in regards to medical advice and was told she couldn't be seen until she completed a COVID test, patient states she got the COVID test done and it is NEGATIVE. Patient is requesting a call back at [phone]

## 2023-05-08 NOTE — Telephone Encounter (Signed)
Called pt.  COVID tested negative.  Symptoms started Monday (5d ago) and include chest congestion, cough mildly productive, lost taste and smell.  No energy.  Initial fever, not now.  No body aches.   Taking 2 tylenol at night to help sleep.  Recommend OTC Coricidin brand cold remedies and/or Robitussin cough syrup.   Recommend OV if not improving over weekend.

## 2023-05-08 NOTE — Telephone Encounter (Signed)
C/o cough and runny nose. Sxs started 05/04/23. Denies any other sxs. States she can have nurse on campus bring her a Covid test but pt really does not want to come out. Plz advise.

## 2023-05-08 NOTE — Telephone Encounter (Signed)
Copied from CRM [id number]. Topic: Clinical - Medication Question >> May 08, 2023 10:05 AM Corin V wrote: Reason for CRM: Patient is calling in asking if Dr. Sharen Hones can send in an Rx for a cough and runny nose. She believes she has whatever is going around and she is not wanting to leave her house to come into an appointment. Agent advised patient that she may have to go to the office to be seen for an Rx to be sent in, but patient still asked to try and have something called in first.

## 2023-05-12 ENCOUNTER — Ambulatory Visit: Payer: Self-pay | Admitting: Family Medicine

## 2023-05-12 NOTE — Telephone Encounter (Signed)
Copied from CRM [id number]. Topic: Appointments - Appointment Scheduling >> May 12, 2023  9:11 AM Louie Casa B wrote: Patient/patient representative is calling to schedule an appointment. Refer to attachments for appointment information. Patient has a bladder infection     Chief Complaint:  Patient states she believe she is getting  a bladder infection. Symptoms:  Feels pressure when urinating. Rates discomfort as 4 on pain scale Frequency:    Day 2  Pertinent Negatives: Patient denies  urgency and frequency. Denies odor or incontinence. Disposition: [] ED /[] Urgent Care (no appt availability in office) / [] Appointment(In office/virtual)/ []  [facility]/ [] Home Care/ [] Refused Recommended Disposition /[] [place] [facility]/ [x]  Follow-up with PCP Additional Notes: Patient requesting to come by and get a cup for lab on her urine. Patient is currently dealing with a Virus but is doing ok. Reason for Disposition  All other urine symptoms  Answer Assessment - Initial Assessment Questions 1. SYMPTOM: "What's the main symptom you're concerned about?" (e.g., frequency, continence)      Urinary pressure and Urgency 2. ONSET: "When did the  urinary pressure start?"      Symptoms started las night 3. PAIN: "Is there any pain?" If Yes, ask: "How bad is it?" (Scale: 1-10; mild, moderate, severe)      Denies pain just  discomfort from the pressure. 4. CAUSE: "What do you think is causing the symptoms?"      Had a UTI   before 5. OTHER SYMPTOMS: "Do you have any other symptoms?" (e.g., blood in urine, fever, flank pain, pain with urination)      Denies. States she does have   respiratory virus  6. PREGNANCY: "Is there any chance you are pregnant?" "When was your last menstrual period?"     NA  Protocols used: Urinary Symptoms-A-AH

## 2023-05-12 NOTE — Telephone Encounter (Signed)
Patient needs an appointment for treatment of UTI. Please call patient to schedule.

## 2023-05-12 NOTE — Telephone Encounter (Signed)
Spoke to pt, scheduled ov with Dr. Milinda Antis for tomorrow, 1/29 @ 3pm. Pt didn't want to wait until Dr. Timoteo Expose next available slot on Fri, 1/30.

## 2023-05-13 ENCOUNTER — Ambulatory Visit (INDEPENDENT_AMBULATORY_CARE_PROVIDER_SITE_OTHER): Payer: PPO | Admitting: Family Medicine

## 2023-05-13 ENCOUNTER — Encounter: Payer: Self-pay | Admitting: Family Medicine

## 2023-05-13 VITALS — BP 116/58 | HR 74 | Temp 97.6°F | Ht 62.5 in | Wt 134.0 lb

## 2023-05-13 DIAGNOSIS — R109 Unspecified abdominal pain: Secondary | ICD-10-CM

## 2023-05-13 DIAGNOSIS — R3989 Other symptoms and signs involving the genitourinary system: Secondary | ICD-10-CM | POA: Insufficient documentation

## 2023-05-13 DIAGNOSIS — J0100 Acute maxillary sinusitis, unspecified: Secondary | ICD-10-CM | POA: Diagnosis not present

## 2023-05-13 DIAGNOSIS — J019 Acute sinusitis, unspecified: Secondary | ICD-10-CM | POA: Insufficient documentation

## 2023-05-13 MED ORDER — AMOXICILLIN-POT CLAVULANATE 875-125 MG PO TABS: 1.0000 | ORAL_TABLET | Freq: Two times a day (BID) | ORAL | 0 refills | Status: DC

## 2023-05-13 NOTE — Assessment & Plan Note (Signed)
Over 2 wk s/p uri  Continued congestion/colored nasal d/c and sinus pressure Treatment with augmentin  See AVS for symptoms care   Update if not starting to improve in a week or if worsening  Call back and Er precautions noted in detail today

## 2023-05-13 NOTE — Assessment & Plan Note (Signed)
This has improved a lot since she made the appointment  Reassuring exam Could not give urine sample  Has spec cup to bring back if symptoms return   Encouraged fluid intake   Call back and Er precautions noted in detail today

## 2023-05-13 NOTE — Patient Instructions (Addendum)
Take augmentin as directed for sinus infection and possible uti   Stay hydrated  Let 's get a urine sample if we can   Nasal saline spray is good for congestion  Breathing steam helps also   Update if not starting to improve in a week or if worsening

## 2023-05-13 NOTE — Progress Notes (Signed)
Subjective:    Patient ID: Kathleen Williamson, female    DOB: 09/28/1940, 83 y.o.   MRN: [id number]  HPI  Wt Readings from Last 3 Encounters:  05/13/23 134 lb (60.8 kg)  12/22/22 137 lb 2 oz (62.2 kg)  12/09/22 137 lb 6 oz (62.3 kg)   24.12 kg/m  Vitals:   05/13/23 1501  BP: (!) 116/58  Pulse: 74  Temp: 97.6 F (36.4 C)  SpO2: 98%   83 yo pt of Dr Reece Agar presents with urinary symptoms and nasal congestion     Uri symptoms for 2 weeks  Started with a head ache and low grade temp and runny nose  Covid test negative   Still feels rotten/tired  Cough -productive / white and thick No wheezing  A little shortness of breath with exertion  Lot of congestion -worse on the left  Mucous is yellow  Right ear is full  Throat is fine   Trying to drink a lot of fluids   Monday- bladder pressure  Drank some cranberry juice  Is better now overall  No dysuria  No blood in urine  No n/v  No confusion    Lab Results  Component Value Date   NA 141 12/05/2022   K 5.0 12/05/2022   CO2 19 (L) 12/05/2022   GLUCOSE 211 (H) 12/05/2022   BUN 16 12/05/2022   CREATININE 1.53 (H) 12/05/2022   CALCIUM 9.0 12/05/2022   GFR 28.89 (L) 10/29/2022   EGFR 34 (L) 12/05/2022   GFRNONAA 47 (L) 11/16/2020     Patient Active Problem List   Diagnosis Date Noted   Sensation of pressure in bladder area 05/13/2023   Acute sinusitis 05/13/2023   Pain of left scapula 12/22/2022   Wrist injury, left, subsequent encounter 12/09/2022   History of UTI 12/09/2022   DNR (do not resuscitate) 09/19/2022   LBBB (left bundle branch block) 01/17/2022   Chronic HFrEF (heart failure with reduced ejection fraction) (HCC) 01/17/2022   Low serum vitamin B12 09/21/2021   Positive ANA (antinuclear antibody) 08/24/2021   Bilateral carpal tunnel syndrome 08/22/2021   Erosive osteoarthritis of both hands 06/18/2021   Memory impairment 01/16/2021   Hypertension 12/11/2020   Status post right hip replacement  11/14/2020   Complicated UTI (urinary tract infection) 10/31/2020   Sensorineural hearing loss, bilateral 07/07/2020   Osteoarthritis of right hip 09/17/2019   Recurrent falls 09/15/2017   Tongue lesion 02/12/2017   Complicated grief 10/21/2016   Health maintenance examination 02/07/2016   Baker's cyst of knee 08/01/2015   Diabetes mellitus type 2, controlled, with complications (HCC) 02/11/2015   Subclavian steal syndrome    Medicare annual wellness visit, subsequent 01/31/2015   Advanced care planning/counseling discussion 01/31/2015   Carotid stenosis 01/31/2015   Vertebral artery stenosis/occlusion 01/31/2015   CKD stage 3 due to type 2 diabetes mellitus (HCC) 01/21/2015   Microscopic colitis 10/26/2014   Osteoporosis    Hypothyroidism    Dermatomyositis (HCC)    MDD (major depressive disorder), recurrent severe, without psychosis (HCC) 01/12/2014   Atherosclerosis of native coronary artery of native heart without angina pectoris 12/13/2013   Hyperlipidemia associated with type 2 diabetes mellitus (HCC) 12/13/2013   Pseudocyst of pancreas 08/29/2011   Past Medical History:  Diagnosis Date   Arthritis    fingers   CAD (coronary artery disease) 2003   MI s/p 5v CABG   Carotid stenosis    Skains   CKD (chronic kidney disease) stage 3, GFR  30-59 ml/min (HCC)    Complicated UTI (urinary tract infection) 10/31/2020   Dermatomyositis (HCC)    saw Dr Corliss Skains, improved on its own   Diabetes mellitus without complication (HCC)    Forehead laceration 09/15/2017   History of chicken pox    History of measles    History of migraine none since 2003   HLD (hyperlipidemia)    Hypothyroidism    MDD (major depressive disorder), recurrent episode, moderate (HCC)    h/o SI after lost husband unexpectedly 2015   Microscopic colitis 05/2014   by colonoscopy, lymphocytic, zoloft related   Myocardial infarction (HCC) 2003   Osteoporosis    DEXA 03/2013 T -3.1, DEXA 10//2016 -2.9 hip,  -2.2 spine   Pancreatic cyst    Dr Arline Asp, no f/u needed   Prediabetes 2014   Subclavian steal syndrome    Urine incontinence    Past Surgical History:  Procedure Laterality Date   APPENDECTOMY  1997   with hysterectomy   BACK SURGERY  1981   lower   BREAST LUMPECTOMY Left 1977   benign   COLONOSCOPY WITH PROPOFOL N/A 05/23/2014   microscopic colitis - Charolett Bumpers, MD   CORONARY ARTERY BYPASS GRAFT  2003   x 5 v   ESOPHAGOGASTRODUODENOSCOPY (EGD) WITH PROPOFOL N/A 05/23/2014   [facility] Charolett Bumpers, MD   ROTATOR CUFF REPAIR Right [phone]   TOOTH EXTRACTION  yrs ago   TOTAL ABDOMINAL HYSTERECTOMY W/ BILATERAL SALPINGOOPHORECTOMY  1997   endometriosis   TOTAL HIP ARTHROPLASTY Right 11/14/2020   Procedure: TOTAL HIP ARTHROPLASTY ANTERIOR APPROACH;  Surgeon: Ollen Gross, MD;  Location: [facility];  Service: Orthopedics;  Laterality: Right;   Social History   Tobacco Use   Smoking status: Never   Smokeless tobacco: Never  Vaping Use   Vaping status: Never Used  Substance Use Topics   Alcohol use: No   Drug use: No   Family History  Problem Relation Age of Onset   CAD Father 27   CAD Mother 67   Diabetes Mother    Rheum arthritis Mother    Cancer Maternal Aunt        breast   CAD Other        strong paternal side   Stroke Neg Hx    Allergies  Allergen Reactions   Sulfa Antibiotics Nausea Only   Crestor [Rosuvastatin Calcium] Other (See Comments)    Leg ache   Current Outpatient Medications on File Prior to Visit  Medication Sig Dispense Refill   Accu-Chek FastClix Lancets MISC USE AS INSTRUCTED TO CHECK BLOOD SUGAR ONCE DAILY 102 each 1   acetaminophen (TYLENOL) 325 MG tablet Take 650 mg by mouth every 6 (six) hours as needed for moderate pain or headache.     amLODipine (NORVASC) 2.5 MG tablet TAKE ONE TABLET BY MOUTH EVERY MORNING 90 tablet 4   aspirin EC 81 MG tablet Take 81 mg by mouth daily. Swallow whole.     atorvastatin (LIPITOR) 40 MG tablet TAKE ONE  TABLET BY MOUTH EVERYDAY AT BEDTIME 90 tablet 3   Blood Glucose Monitoring Suppl (ACCU-CHEK GUIDE) w/Device KIT 1 each by Does not apply route as directed. Use as instructed to check blood sugar once daily 1 kit 0   Cholecalciferol (VITAMIN D3) 25 MCG (1000 UT) CAPS Take 2 capsules (2,000 Units total) by mouth daily. 30 capsule    Coenzyme Q10 100 MG capsule Take 100 mg by mouth daily.     cyanocobalamin (  VITAMIN B12) 1000 MCG tablet Take 1 tablet (1,000 mcg total) by mouth once a week.     denosumab (PROLIA) 60 MG/ML SOSY injection Inject 60 mg into the skin every 6 (six) months.     ezetimibe (ZETIA) 10 MG tablet Take 1 tablet (10 mg total) by mouth daily. 90 tablet 3   glucose blood (ONETOUCH VERIO) test strip Use as instructed to check blood sugar once a day 100 each 3   levothyroxine (SYNTHROID) 50 MCG tablet TAKE 1 TABLET BY MOUTH ONCE DAILY *REFILL REQUEST* 90 tablet 3   lisinopril (ZESTRIL) 10 MG tablet Take 1 tablet (10 mg total) by mouth daily. 30 tablet 11   loperamide (IMODIUM A-D) 2 MG tablet Take 2 mg by mouth 4 (four) times daily as needed for diarrhea or loose stools.     mirtazapine (REMERON) 15 MG tablet TAKE 1 TABLET BY MOUTH EVERY DAY AT BEDTIME 30 tablet 10   nitroGLYCERIN (NITROLINGUAL) 0.4 MG/SPRAY spray Place 1 spray under the tongue every 5 (five) minutes as needed. 12 g prn   sitaGLIPtin (JANUVIA) 25 MG tablet Take 1 tablet (25 mg total) by mouth daily. 30 tablet 9   venlafaxine XR (EFFEXOR-XR) 75 MG 24 hr capsule TAKE THREE CAPSULES BY MOUTH EVERY MORNING 270 capsule 4   No current facility-administered medications on file prior to visit.    Review of Systems  Constitutional:  Negative for activity change, appetite change, fatigue, fever and unexpected weight change.  HENT:  Positive for ear pain, postnasal drip and sinus pressure. Negative for congestion, ear discharge, nosebleeds, rhinorrhea, sinus pain and sore throat.   Eyes:  Negative for pain, redness, itching  and visual disturbance.  Respiratory:  Positive for cough. Negative for shortness of breath and wheezing.   Cardiovascular:  Negative for chest pain and palpitations.  Gastrointestinal:  Negative for abdominal pain, blood in stool, constipation, diarrhea, nausea and vomiting.  Endocrine: Negative for polydipsia and polyuria.  Genitourinary:  Negative for dysuria, frequency and urgency.       Bladder pressure It has resolved   Musculoskeletal:  Negative for arthralgias, back pain and myalgias.  Skin:  Negative for pallor and rash.  Allergic/Immunologic: Negative for environmental allergies and immunocompromised state.  Neurological:  Negative for dizziness, tremors, syncope, weakness, numbness and headaches.  Hematological:  Negative for adenopathy. Does not bruise/bleed easily.  Psychiatric/Behavioral:  Negative for decreased concentration and dysphoric mood. The patient is not nervous/anxious.        Objective:   Physical Exam Constitutional:      General: She is not in acute distress.    Appearance: Normal appearance. She is well-developed and normal weight. She is not ill-appearing or diaphoretic.  HENT:     Head: Normocephalic and atraumatic.     Comments: Very mild maxillary sinus tenderness to palp    Right Ear: Tympanic membrane and external ear normal.     Left Ear: Tympanic membrane and external ear normal.     Nose: Congestion and rhinorrhea present.     Mouth/Throat:     Pharynx: Oropharynx is clear. No oropharyngeal exudate or posterior oropharyngeal erythema.     Comments: Clear pnd Eyes:     General:        Right eye: No discharge.        Left eye: No discharge.     Conjunctiva/sclera: Conjunctivae normal.     Pupils: Pupils are equal, round, and reactive to light.  Cardiovascular:     Rate  and Rhythm: Normal rate and regular rhythm.  Pulmonary:     Effort: Pulmonary effort is normal. No respiratory distress.     Breath sounds: Normal breath sounds. No wheezing  or rales.     Comments: Good air exch No rales or rhonchi Abdominal:     General: Abdomen is flat. Bowel sounds are normal.     Palpations: There is no mass.     Tenderness: There is no right CVA tenderness or left CVA tenderness.     Comments: No suprapubic tenderness or fullness    Musculoskeletal:     Cervical back: Normal range of motion and neck supple.  Lymphadenopathy:     Cervical: No cervical adenopathy.  Skin:    General: Skin is warm and dry.     Findings: No rash.  Neurological:     Mental Status: She is alert.     Cranial Nerves: No cranial nerve deficit.     Coordination: Coordination normal.  Psychiatric:        Mood and Affect: Mood normal.           Assessment & Plan:   Problem List Items Addressed This Visit       Respiratory   Acute sinusitis - Primary   Over 2 wk s/p uri  Continued congestion/colored nasal d/c and sinus pressure Treatment with augmentin  See AVS for symptoms care   Update if not starting to improve in a week or if worsening  Call back and Er precautions noted in detail today        Relevant Medications   amoxicillin-clavulanate (AUGMENTIN) 875-125 MG tablet     Other   Sensation of pressure in bladder area   This has improved a lot since she made the appointment  Reassuring exam Could not give urine sample  Has spec cup to bring back if symptoms return   Encouraged fluid intake   Call back and Er precautions noted in detail today        Other Visit Diagnoses       Abdominal pressure

## 2023-05-20 ENCOUNTER — Other Ambulatory Visit: Payer: Self-pay

## 2023-05-20 DIAGNOSIS — M810 Age-related osteoporosis without current pathological fracture: Secondary | ICD-10-CM

## 2023-05-20 MED ORDER — DENOSUMAB 60 MG/ML ~~LOC~~ SOSY
60.0000 mg | PREFILLED_SYRINGE | Freq: Once | SUBCUTANEOUS | Status: AC
Start: 2023-06-03 — End: ?

## 2023-05-22 ENCOUNTER — Other Ambulatory Visit (HOSPITAL_COMMUNITY): Payer: Self-pay

## 2023-05-28 ENCOUNTER — Other Ambulatory Visit (HOSPITAL_COMMUNITY): Payer: Self-pay

## 2023-05-28 ENCOUNTER — Other Ambulatory Visit: Payer: Self-pay

## 2023-05-28 MED ORDER — VENLAFAXINE HCL ER 75 MG PO CP24
225.0000 mg | ORAL_CAPSULE | Freq: Every morning | ORAL | 3 refills | Status: DC
  Filled 2023-05-28: qty 270, 90d supply, fill #0

## 2023-05-28 MED ORDER — CO Q-10 100 MG PO CAPS
100.0000 mg | ORAL_CAPSULE | Freq: Every day | ORAL | 8 refills | Status: AC
  Filled 2023-05-28: qty 30, 30d supply, fill #0

## 2023-05-28 MED ORDER — EZETIMIBE 10 MG PO TABS
10.0000 mg | ORAL_TABLET | Freq: Every day | ORAL | 2 refills | Status: DC
Start: 2022-12-31 — End: 2023-08-06
  Filled 2023-05-28: qty 90, 90d supply, fill #0

## 2023-05-28 MED ORDER — LISINOPRIL 10 MG PO TABS
10.0000 mg | ORAL_TABLET | Freq: Every day | ORAL | 7 refills | Status: DC
  Filled 2023-05-28: qty 30, 30d supply, fill #0

## 2023-05-28 MED ORDER — AMLODIPINE BESYLATE 2.5 MG PO TABS
2.5000 mg | ORAL_TABLET | Freq: Every morning | ORAL | 3 refills | Status: DC
  Filled 2023-05-28: qty 90, 90d supply, fill #0

## 2023-05-29 ENCOUNTER — Telehealth: Payer: Self-pay

## 2023-05-29 ENCOUNTER — Other Ambulatory Visit: Payer: Self-pay

## 2023-05-29 NOTE — Telephone Encounter (Signed)
Prolia VOB initiated via [email or web address]  Next Prolia inj DUE: NOW

## 2023-06-01 ENCOUNTER — Other Ambulatory Visit (HOSPITAL_COMMUNITY): Payer: Self-pay

## 2023-06-01 ENCOUNTER — Other Ambulatory Visit: Payer: Self-pay

## 2023-06-01 MED FILL — Mirtazapine Tab 15 MG: ORAL | 30 days supply | Qty: 30 | Fill #0 | Status: AC

## 2023-06-01 MED FILL — Levothyroxine Sodium Tab 50 MCG: ORAL | 90 days supply | Qty: 90 | Fill #0 | Status: CN

## 2023-06-01 MED FILL — Atorvastatin Calcium Tab 40 MG (Base Equivalent): ORAL | 90 days supply | Qty: 90 | Fill #0 | Status: CN

## 2023-06-01 MED FILL — Levothyroxine Sodium Tab 50 MCG: ORAL | 90 days supply | Qty: 90 | Fill #0 | Status: AC

## 2023-06-01 MED FILL — Mirtazapine Tab 15 MG: ORAL | 30 days supply | Qty: 30 | Fill #0 | Status: CN

## 2023-06-01 MED FILL — Atorvastatin Calcium Tab 40 MG (Base Equivalent): ORAL | 90 days supply | Qty: 90 | Fill #0 | Status: AC

## 2023-06-08 ENCOUNTER — Other Ambulatory Visit (HOSPITAL_COMMUNITY): Payer: Self-pay

## 2023-06-08 ENCOUNTER — Other Ambulatory Visit: Payer: Self-pay

## 2023-06-08 NOTE — Telephone Encounter (Signed)
 Pt ready for scheduling for PROLIA on or after : 06/08/23  Out-of-pocket cost due at time of visit: $332  Number of injection/visits approved: ---  Primary: [employer] Prolia co-insurance: 20% Admin fee co-insurance: 0%  Secondary: --- Prolia co-insurance:  Admin fee co-insurance:   Medical Benefit Details: Date Benefits were checked: 05/29/23 Deductible: NO/ Coinsurance: 20%/ Admin Fee: 0%  Prior Auth: N/A PA# Expiration Date:   # of doses approved:  Pharmacy benefit: Copay $250 If patient wants fill through the pharmacy benefit please send prescription to: [employer], and include estimated need by date in rx notes. Pharmacy will ship medication directly to the office.  Patient NOT eligible for Prolia Copay Card. Copay Card can make patient's cost as little as $25. Link to apply: [email or web address]://[email or web address].[email or web address]  ** This summary of benefits is an estimation of the patient's out-of-pocket cost. Exact cost may very based on individual plan coverage.

## 2023-06-11 ENCOUNTER — Other Ambulatory Visit: Payer: Self-pay

## 2023-06-11 ENCOUNTER — Ambulatory Visit: Payer: PPO | Admitting: Family Medicine

## 2023-06-11 ENCOUNTER — Other Ambulatory Visit (HOSPITAL_COMMUNITY): Payer: Self-pay

## 2023-06-11 ENCOUNTER — Encounter: Payer: Self-pay | Admitting: Family Medicine

## 2023-06-11 VITALS — BP 120/64 | HR 90 | Temp 97.8°F | Ht 62.5 in | Wt 132.0 lb

## 2023-06-11 DIAGNOSIS — E1122 Type 2 diabetes mellitus with diabetic chronic kidney disease: Secondary | ICD-10-CM

## 2023-06-11 DIAGNOSIS — F332 Major depressive disorder, recurrent severe without psychotic features: Secondary | ICD-10-CM

## 2023-06-11 DIAGNOSIS — Z7984 Long term (current) use of oral hypoglycemic drugs: Secondary | ICD-10-CM

## 2023-06-11 DIAGNOSIS — E118 Type 2 diabetes mellitus with unspecified complications: Secondary | ICD-10-CM | POA: Diagnosis not present

## 2023-06-11 DIAGNOSIS — N183 Chronic kidney disease, stage 3 unspecified: Secondary | ICD-10-CM | POA: Diagnosis not present

## 2023-06-11 DIAGNOSIS — R0609 Other forms of dyspnea: Secondary | ICD-10-CM

## 2023-06-11 DIAGNOSIS — M810 Age-related osteoporosis without current pathological fracture: Secondary | ICD-10-CM

## 2023-06-11 LAB — POCT GLYCOSYLATED HEMOGLOBIN (HGB A1C): Hemoglobin A1C: 6.7 % — AB (ref 4.0–5.6)

## 2023-06-11 LAB — BASIC METABOLIC PANEL
BUN: 25 mg/dL — ABNORMAL HIGH (ref 6–23)
CO2: 27 meq/L (ref 19–32)
Calcium: 9.7 mg/dL (ref 8.4–10.5)
Chloride: 103 meq/L (ref 96–112)
Creatinine, Ser: 1.69 mg/dL — ABNORMAL HIGH (ref 0.40–1.20)
GFR: 28.16 mL/min — ABNORMAL LOW (ref >60.00)
Glucose, Bld: 112 mg/dL — ABNORMAL HIGH (ref 70–99)
Potassium: 4.4 meq/L (ref 3.5–5.1)
Sodium: 137 meq/L (ref 135–145)

## 2023-06-11 MED ORDER — NITROGLYCERIN 0.4 MG/SPRAY TL SOLN
1.0000 | 1 refills | Status: DC | PRN
Start: 2023-06-11 — End: 2023-08-07

## 2023-06-11 MED ORDER — DENOSUMAB 60 MG/ML ~~LOC~~ SOSY
60.0000 mg | PREFILLED_SYRINGE | Freq: Once | SUBCUTANEOUS | 0 refills | Status: AC
Start: 2023-06-11 — End: 2023-06-12
  Filled 2023-06-11: qty 1, 180d supply, fill #0

## 2023-06-11 NOTE — Telephone Encounter (Signed)
 Patient in office to see provider today. She will get labs today. Have scheduled for nurse visit for Prolia. Will call patient if any issues this will be patient supply

## 2023-06-11 NOTE — Progress Notes (Signed)
 Specialty Pharmacy Refill Coordination Note  Kathleen Williamson is a 83 y.o. female contacted today regarding refills of specialty medication(s) Denosumab (PROLIA)   Patient requested Courier to Provider Office   Delivery date: 06/12/23   Verified address: [facility] at [facility]- [address] [place]   Medication will be filled on 02.27.25.

## 2023-06-11 NOTE — Addendum Note (Signed)
 Addended by: Donnamarie Poag on: 06/11/2023 11:14 AM   Modules accepted: Orders

## 2023-06-11 NOTE — Patient Instructions (Addendum)
 I will refill nitroglycerin spray. Call to schedule appointment with cardiology team and let them know of your exertional symptoms.  Continue current medicines otherwise.  I agree with getting more involved at [facility] For Prolia - labs today - then we will contact you to schedule shot.  For Januvia- it is now available at your pharmacy to pick up.  Return after 09/19/2023 for physical

## 2023-06-11 NOTE — Progress Notes (Incomplete)
 Ph: [phone] Fax: [phone]   Patient ID: Kathleen Williamson, female    DOB: Jun 12, 1940, 83 y.o.   MRN: [id number]  This visit was conducted in person.  BP 120/64   Pulse 90   Temp 97.8 F (36.6 C) (Oral)   Ht 5' 2.5" (1.588 m)   Wt 132 lb (59.9 kg)   LMP  (LMP Unknown)   SpO2 99%   BMI 23.76 kg/m    CC: discuss mood Subjective:   HPI: Kathleen Williamson is a 83 y.o. female presenting on 06/11/2023 for Medical Management of Chronic Issues (Here for mood f/u. )   Due for Prolia.   Longstanding MDD on Effexor XR 225mg  daily with remeron 15mg  nightly. Previously also on celexa. Now off klonopin. She last saw counselor Inetta Fermo at [facility] - declines return. Has not seen psychiatry.   She has noted worsening mood difficult this winter - weather doesn't help. She has not wanted to go out due to this.  Feels she's sleeping well with mirtazapine 15mg  nightly. She also uses melatonin 3mg  nightly.  Sister in law died 2023/07/10.   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  Hopeful for improved mood with better weather.   H/o CAD s/p CABG 2003, LBBB, iCM, chronic HFrEF.  She noted an episode last week of exertional dyspnea and substernal heaviness associated with nausea that did improve after a few minutes - ongoing over a year.     06/11/2023   10:47 AM 05/13/2023    3:10 PM 12/22/2022   11:39 AM 09/05/2022   10:15 AM 06/25/2022    2:43 PM  Depression screen PHQ 2/9  Decreased Interest 2 0 1 0 0  Down, Depressed, Hopeless 2 0 1 2 1   PHQ - 2 Score 4 0 2 2 1   Altered sleeping 1 0 2  0  Tired, decreased energy 1 0 2  0  Change in appetite 2 0 0  0  Feeling bad or failure about yourself  0 0 0  0  Trouble concentrating 0 0 0  0  Moving slowly or fidgety/restless 0 0 0  0  Suicidal thoughts 0 0 0  0  PHQ-9 Score 8 0 6  1  Difficult doing work/chores Somewhat difficult Not difficult at all Not difficult at  all  Not difficult at all       06/11/2023   10:47 AM 12/22/2022   11:40 AM 06/25/2022    2:44 PM 09/17/2021    4:12 PM  GAD 7 : Generalized Anxiety Score  Nervous, Anxious, on Edge 2 0 0 1  Control/stop worrying 1 0 1 1  Worry too much - different things 2 0 0 1  Trouble relaxing 2 1 0 1  Restless 0 0 0 0  Easily annoyed or irritable 2 0 0 0  Afraid - awful might happen 0 0 0 0  Total GAD 7 Score 9 1 1 4   Anxiety Difficulty Not difficult at all Not difficult at all Not difficult at all Not difficult at all   DM - stopped farxiga due to concern over recurrent UTIs. Avoiding metformin due to CKD. Januvia 25mg  daily started 12/2022 however she hasn't taken in over a month due to med shortage. Has not checked sugar this week.  Lab Results  Component Value Date   HGBA1C 6.7 (A) 06/11/2023  Relevant past medical, surgical, family and social history reviewed and updated as indicated. Interim medical history since our last visit reviewed. Allergies and medications reviewed and updated. Outpatient Medications Prior to Visit  Medication Sig Dispense Refill  . Accu-Chek FastClix Lancets MISC USE AS INSTRUCTED TO CHECK BLOOD SUGAR ONCE DAILY 102 each 1  . acetaminophen (TYLENOL) 325 MG tablet Take 650 mg by mouth every 6 (six) hours as needed for moderate pain or headache.    Marland Kitchen amLODipine (NORVASC) 2.5 MG tablet Take 1 tablet (2.5 mg total) by mouth every morning. 90 tablet 3  . aspirin EC 81 MG tablet Take 81 mg by mouth daily. Swallow whole.    Marland Kitchen atorvastatin (LIPITOR) 40 MG tablet Take 1 tablet (40 mg total) by mouth at bedtime. 90 tablet 3  . Blood Glucose Monitoring Suppl (ACCU-CHEK GUIDE) w/Device KIT 1 each by Does not apply route as directed. Use as instructed to check blood sugar once daily 1 kit 0  . Cholecalciferol (VITAMIN D3) 25 MCG (1000 UT) CAPS Take 2 capsules (2,000 Units total) by mouth daily. 30 capsule   . Coenzyme Q10 (CO Q-10) 100 MG CAPS Take 100 mg by mouth at  bedtime. 30 capsule 8  . cyanocobalamin (VITAMIN B12) 1000 MCG tablet Take 1 tablet (1,000 mcg total) by mouth once a week.    . denosumab (PROLIA) 60 MG/ML SOSY injection Inject 60 mg into the skin every 6 (six) months.    . ezetimibe (ZETIA) 10 MG tablet Take 1 tablet (10 mg total) by mouth daily. 90 tablet 2  . glucose blood (ONETOUCH VERIO) test strip Use as instructed to check blood sugar once a day 100 each 3  . levothyroxine (SYNTHROID) 50 MCG tablet Take 1 tablet (50 mcg total) by mouth daily. 90 tablet 3  . lisinopril (ZESTRIL) 10 MG tablet Take 1 tablet (10 mg total) by mouth daily. 30 tablet 7  . loperamide (IMODIUM A-D) 2 MG tablet Take 2 mg by mouth 4 (four) times daily as needed for diarrhea or loose stools.    . mirtazapine (REMERON) 15 MG tablet Take 1 tablet (15 mg total) by mouth at bedtime. 30 tablet 10  . sitaGLIPtin (JANUVIA) 25 MG tablet Take 1 tablet (25 mg total) by mouth daily. 30 tablet 9  . venlafaxine XR (EFFEXOR-XR) 75 MG 24 hr capsule Take 3 capsules (225 mg total) by mouth every morning. 270 capsule 3  . amLODipine (NORVASC) 2.5 MG tablet TAKE ONE TABLET BY MOUTH EVERY MORNING 90 tablet 4  . amoxicillin-clavulanate (AUGMENTIN) 875-125 MG tablet Take 1 tablet by mouth 2 (two) times daily. 14 tablet 0  . Coenzyme Q10 100 MG capsule Take 100 mg by mouth daily.    Marland Kitchen ezetimibe (ZETIA) 10 MG tablet Take 1 tablet (10 mg total) by mouth daily. 90 tablet 3  . lisinopril (ZESTRIL) 10 MG tablet Take 1 tablet (10 mg total) by mouth daily. 30 tablet 11  . nitroGLYCERIN (NITROLINGUAL) 0.4 MG/SPRAY spray Place 1 spray under the tongue every 5 (five) minutes as needed. 12 g prn  . venlafaxine XR (EFFEXOR-XR) 75 MG 24 hr capsule TAKE THREE CAPSULES BY MOUTH EVERY MORNING 270 capsule 4   Facility-Administered Medications Prior to Visit  Medication Dose Route Frequency Provider Last Rate Last Admin  . denosumab (PROLIA) injection 60 mg  60 mg Subcutaneous Once Eustaquio Boyden, MD          Per HPI unless specifically indicated in ROS section below Review of  Systems  Objective:  BP 120/64   Pulse 90   Temp 97.8 F (36.6 C) (Oral)   Ht 5' 2.5" (1.588 m)   Wt 132 lb (59.9 kg)   LMP  (LMP Unknown)   SpO2 99%   BMI 23.76 kg/m   Wt Readings from Last 3 Encounters:  06/11/23 132 lb (59.9 kg)  05/13/23 134 lb (60.8 kg)  12/22/22 137 lb 2 oz (62.2 kg)      Physical Exam Vitals and nursing note reviewed.  Constitutional:      Appearance: Normal appearance. She is not ill-appearing.  HENT:     Head: Normocephalic and atraumatic.     Mouth/Throat:     Mouth: Mucous membranes are moist.     Pharynx: Oropharynx is clear. No oropharyngeal exudate or posterior oropharyngeal erythema.  Eyes:     Extraocular Movements: Extraocular movements intact.     Pupils: Pupils are equal, round, and reactive to light.  Cardiovascular:     Rate and Rhythm: Normal rate and regular rhythm.     Pulses: Normal pulses.     Heart sounds: Normal heart sounds. No murmur heard. Pulmonary:     Effort: Pulmonary effort is normal. No respiratory distress.     Breath sounds: Normal breath sounds. No wheezing, rhonchi or rales.  Musculoskeletal:     Right lower leg: No edema.     Left lower leg: No edema.  Skin:    General: Skin is warm and dry.     Findings: No rash.  Neurological:     Mental Status: She is alert.  Psychiatric:        Mood and Affect: Mood normal.        Behavior: Behavior normal.       Results for orders placed or performed in visit on 06/11/23  POCT glycosylated hemoglobin (Hb A1C)   Collection Time: 06/11/23 10:48 AM  Result Value Ref Range   Hemoglobin A1C 6.7 (A) 4.0 - 5.6 %   HbA1c POC (<> result, manual entry)     HbA1c, POC (prediabetic range)     HbA1c, POC (controlled diabetic range)    Basic metabolic panel   Collection Time: 06/11/23 11:32 AM  Result Value Ref Range   Sodium 137 135 - 145 mEq/L   Potassium 4.4 3.5 - 5.1 mEq/L   Chloride 103  96 - 112 mEq/L   CO2 27 19 - 32 mEq/L   Glucose, Bld 112 (H) 70 - 99 mg/dL   BUN 25 (H) 6 - 23 mg/dL   Creatinine, Ser 8.29 (H) 0.40 - 1.20 mg/dL   GFR 56.21 (L) >30.86 mL/min   Calcium 9.7 8.4 - 10.5 mg/dL   Lab Results  Component Value Date   CHOL 154 11/05/2022   HDL 41 11/05/2022   LDLCALC 74 11/05/2022   LDLDIRECT 79.0 09/17/2021   TRIG 236 (H) 11/05/2022   CHOLHDL 3.8 11/05/2022   Assessment & Plan:   Problem List Items Addressed This Visit     Osteoporosis   Relevant Orders   Basic metabolic panel (Completed)   Diabetes mellitus type 2, controlled, with complications (HCC) - Primary   Relevant Orders   POCT glycosylated hemoglobin (Hb A1C) (Completed)     Meds ordered this encounter  Medications  . nitroGLYCERIN (NITROLINGUAL) 0.4 MG/SPRAY spray    Sig: Place 1 spray under the tongue every 5 (five) minutes as needed.    Dispense:  12 g    Refill:  1  Orders Placed This Encounter  Procedures  . Basic metabolic panel  . POCT glycosylated hemoglobin (Hb A1C)    Patient Instructions  I will refill nitroglycerin spray. Call to schedule appointment with cardiology team and let them know of your exertional symptoms.  Continue current medicines otherwise.  I agree with getting more involved at [facility] For Prolia - labs today - then we will contact you to schedule shot.  For Januvia- it is now available at your pharmacy to pick up.  Return after 09/19/2023 for physical  Follow up plan: Return in about 3 months (around 09/19/2023), or if symptoms worsen or fail to improve, for annual exam, prior fasting for blood work.  Eustaquio Boyden, MD

## 2023-06-11 NOTE — Progress Notes (Unsigned)
 Ph: [phone] Fax: [phone]   Patient ID: Kathleen Williamson, female    DOB: April 08, 1941, 83 y.o.   MRN: [id number]  This visit was conducted in person.  BP 120/64   Pulse 90   Temp 97.8 F (36.6 C) (Oral)   Ht 5' 2.5" (1.588 m)   Wt 132 lb (59.9 kg)   LMP  (LMP Unknown)   SpO2 99%   BMI 23.76 kg/m    CC: discuss mood Subjective:   HPI: Kathleen Williamson is a 83 y.o. female presenting on 06/11/2023 for Medical Management of Chronic Issues (Here for mood f/u. )   Due for Prolia.   Longstanding MDD on Effexor XR 225mg  daily with remeron 15mg  nightly. Previously also on celexa. Now off klonopin. She last saw counselor Inetta Fermo at [facility] - declines return. Has not seen psychiatry.   She has noted worsening mood difficult this winter - weather doesn't help. She has not wanted to go out due to this.  Feels she's sleeping well with mirtazapine 15mg  nightly. She also uses melatonin 3mg  nightly.  Sister in law died 07/16/2023.   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  Hopeful for improved mood with better weather.   H/o CAD s/p CABG 2003, LBBB, iCM, chronic HFrEF.  She noted an episode last week of exertional dyspnea and substernal heaviness associated with nausea that did improve after a few minutes - ongoing over a year.     06/11/2023   10:47 AM 05/13/2023    3:10 PM 12/22/2022   11:39 AM 09/05/2022   10:15 AM 06/25/2022    2:43 PM  Depression screen PHQ 2/9  Decreased Interest 2 0 1 0 0  Down, Depressed, Hopeless 2 0 1 2 1   PHQ - 2 Score 4 0 2 2 1   Altered sleeping 1 0 2  0  Tired, decreased energy 1 0 2  0  Change in appetite 2 0 0  0  Feeling bad or failure about yourself  0 0 0  0  Trouble concentrating 0 0 0  0  Moving slowly or fidgety/restless 0 0 0  0  Suicidal thoughts 0 0 0  0  PHQ-9 Score 8 0 6  1  Difficult doing work/chores Somewhat difficult Not difficult at all Not difficult at  all  Not difficult at all       06/11/2023   10:47 AM 12/22/2022   11:40 AM 06/25/2022    2:44 PM 09/17/2021    4:12 PM  GAD 7 : Generalized Anxiety Score  Nervous, Anxious, on Edge 2 0 0 1  Control/stop worrying 1 0 1 1  Worry too much - different things 2 0 0 1  Trouble relaxing 2 1 0 1  Restless 0 0 0 0  Easily annoyed or irritable 2 0 0 0  Afraid - awful might happen 0 0 0 0  Total GAD 7 Score 9 1 1 4   Anxiety Difficulty Not difficult at all Not difficult at all Not difficult at all Not difficult at all   DM - stopped farxiga due to concern over recurrent UTIs. Avoiding metformin due to CKD. Januvia 25mg  daily started 12/2022 however she hasn't taken in over a month due to med shortage. Has not checked sugar this week.  Lab Results  Component Value Date   HGBA1C 6.7 (A) 06/11/2023  Relevant past medical, surgical, family and social history reviewed and updated as indicated. Interim medical history since our last visit reviewed. Allergies and medications reviewed and updated. Outpatient Medications Prior to Visit  Medication Sig Dispense Refill  . Accu-Chek FastClix Lancets MISC USE AS INSTRUCTED TO CHECK BLOOD SUGAR ONCE DAILY 102 each 1  . acetaminophen (TYLENOL) 325 MG tablet Take 650 mg by mouth every 6 (six) hours as needed for moderate pain or headache.    Marland Kitchen amLODipine (NORVASC) 2.5 MG tablet Take 1 tablet (2.5 mg total) by mouth every morning. 90 tablet 3  . aspirin EC 81 MG tablet Take 81 mg by mouth daily. Swallow whole.    Marland Kitchen atorvastatin (LIPITOR) 40 MG tablet Take 1 tablet (40 mg total) by mouth at bedtime. 90 tablet 3  . Blood Glucose Monitoring Suppl (ACCU-CHEK GUIDE) w/Device KIT 1 each by Does not apply route as directed. Use as instructed to check blood sugar once daily 1 kit 0  . Cholecalciferol (VITAMIN D3) 25 MCG (1000 UT) CAPS Take 2 capsules (2,000 Units total) by mouth daily. 30 capsule   . Coenzyme Q10 (CO Q-10) 100 MG CAPS Take 100 mg by mouth at  bedtime. 30 capsule 8  . cyanocobalamin (VITAMIN B12) 1000 MCG tablet Take 1 tablet (1,000 mcg total) by mouth once a week.    . denosumab (PROLIA) 60 MG/ML SOSY injection Inject 60 mg into the skin every 6 (six) months.    . ezetimibe (ZETIA) 10 MG tablet Take 1 tablet (10 mg total) by mouth daily. 90 tablet 2  . glucose blood (ONETOUCH VERIO) test strip Use as instructed to check blood sugar once a day 100 each 3  . levothyroxine (SYNTHROID) 50 MCG tablet Take 1 tablet (50 mcg total) by mouth daily. 90 tablet 3  . lisinopril (ZESTRIL) 10 MG tablet Take 1 tablet (10 mg total) by mouth daily. 30 tablet 7  . loperamide (IMODIUM A-D) 2 MG tablet Take 2 mg by mouth 4 (four) times daily as needed for diarrhea or loose stools.    . mirtazapine (REMERON) 15 MG tablet Take 1 tablet (15 mg total) by mouth at bedtime. 30 tablet 10  . sitaGLIPtin (JANUVIA) 25 MG tablet Take 1 tablet (25 mg total) by mouth daily. 30 tablet 9  . venlafaxine XR (EFFEXOR-XR) 75 MG 24 hr capsule Take 3 capsules (225 mg total) by mouth every morning. 270 capsule 3  . amLODipine (NORVASC) 2.5 MG tablet TAKE ONE TABLET BY MOUTH EVERY MORNING 90 tablet 4  . amoxicillin-clavulanate (AUGMENTIN) 875-125 MG tablet Take 1 tablet by mouth 2 (two) times daily. 14 tablet 0  . Coenzyme Q10 100 MG capsule Take 100 mg by mouth daily.    Marland Kitchen ezetimibe (ZETIA) 10 MG tablet Take 1 tablet (10 mg total) by mouth daily. 90 tablet 3  . lisinopril (ZESTRIL) 10 MG tablet Take 1 tablet (10 mg total) by mouth daily. 30 tablet 11  . nitroGLYCERIN (NITROLINGUAL) 0.4 MG/SPRAY spray Place 1 spray under the tongue every 5 (five) minutes as needed. 12 g prn  . venlafaxine XR (EFFEXOR-XR) 75 MG 24 hr capsule TAKE THREE CAPSULES BY MOUTH EVERY MORNING 270 capsule 4   Facility-Administered Medications Prior to Visit  Medication Dose Route Frequency Provider Last Rate Last Admin  . denosumab (PROLIA) injection 60 mg  60 mg Subcutaneous Once Eustaquio Boyden, MD          Per HPI unless specifically indicated in ROS section below Review of  Systems  Objective:  BP 120/64   Pulse 90   Temp 97.8 F (36.6 C) (Oral)   Ht 5' 2.5" (1.588 m)   Wt 132 lb (59.9 kg)   LMP  (LMP Unknown)   SpO2 99%   BMI 23.76 kg/m   Wt Readings from Last 3 Encounters:  06/11/23 132 lb (59.9 kg)  05/13/23 134 lb (60.8 kg)  12/22/22 137 lb 2 oz (62.2 kg)      Physical Exam    Results for orders placed or performed in visit on 06/11/23  POCT glycosylated hemoglobin (Hb A1C)   Collection Time: 06/11/23 10:48 AM  Result Value Ref Range   Hemoglobin A1C 6.7 (A) 4.0 - 5.6 %   HbA1c POC (<> result, manual entry)     HbA1c, POC (prediabetic range)     HbA1c, POC (controlled diabetic range)    Basic metabolic panel   Collection Time: 06/11/23 11:32 AM  Result Value Ref Range   Sodium 137 135 - 145 mEq/L   Potassium 4.4 3.5 - 5.1 mEq/L   Chloride 103 96 - 112 mEq/L   CO2 27 19 - 32 mEq/L   Glucose, Bld 112 (H) 70 - 99 mg/dL   BUN 25 (H) 6 - 23 mg/dL   Creatinine, Ser 8.41 (H) 0.40 - 1.20 mg/dL   GFR 32.44 (L) >01.02 mL/min   Calcium 9.7 8.4 - 10.5 mg/dL   Lab Results  Component Value Date   CHOL 154 11/05/2022   HDL 41 11/05/2022   LDLCALC 74 11/05/2022   LDLDIRECT 79.0 09/17/2021   TRIG 236 (H) 11/05/2022   CHOLHDL 3.8 11/05/2022   Assessment & Plan:   Problem List Items Addressed This Visit     Osteoporosis   Relevant Orders   Basic metabolic panel (Completed)   Diabetes mellitus type 2, controlled, with complications (HCC) - Primary   Relevant Orders   POCT glycosylated hemoglobin (Hb A1C) (Completed)     Meds ordered this encounter  Medications  . nitroGLYCERIN (NITROLINGUAL) 0.4 MG/SPRAY spray    Sig: Place 1 spray under the tongue every 5 (five) minutes as needed.    Dispense:  12 g    Refill:  1    Orders Placed This Encounter  Procedures  . Basic metabolic panel  . POCT glycosylated hemoglobin (Hb A1C)    Patient Instructions   I will refill nitroglycerin spray. Call to schedule appointment with cardiology team and let them know of your exertional symptoms.  Continue current medicines otherwise.  I agree with getting more involved at [facility] For Prolia - labs today - then we will contact you to schedule shot.  For Januvia- it is now available at your pharmacy to pick up.  Return after 09/19/2023 for physical  Follow up plan: Return in about 3 months (around 09/19/2023), or if symptoms worsen or fail to improve, for annual exam, prior fasting for blood work.  Eustaquio Boyden, MD

## 2023-06-12 DIAGNOSIS — R0609 Other forms of dyspnea: Secondary | ICD-10-CM | POA: Insufficient documentation

## 2023-06-12 NOTE — Assessment & Plan Note (Addendum)
 Update BMP pending prolia.

## 2023-06-12 NOTE — Assessment & Plan Note (Signed)
 Episode last week, known CAD. Refill nitroglycerin spray. She will call for cardiology f/u

## 2023-06-12 NOTE — Assessment & Plan Note (Signed)
 Due for Prolia - will check BMP and work towards getting this set up.  Cr 1.69, GFR 28, CrCl = 25. Recommend rpt BMP 1 wk after Prolia shot.

## 2023-06-12 NOTE — Assessment & Plan Note (Signed)
 Chronic, improved A1c despite being off [place] x1 month - checked with pharmacy, this is available. She will restart.

## 2023-06-12 NOTE — Assessment & Plan Note (Addendum)
 Chronic, deteriorated. Recent deaths in family. Reviewed with patient. She declines medication change - continue effexor XR 225mg  daily and remeron 15mg  nightly.  She is planning to start volunteering at [facility]. Also expects mood to improve with improvement in weather

## 2023-06-16 ENCOUNTER — Telehealth: Payer: Self-pay | Admitting: Cardiology

## 2023-06-16 NOTE — Telephone Encounter (Signed)
 Tried to call patient, no answer on home or mobile phone.

## 2023-06-16 NOTE — Telephone Encounter (Signed)
 Kathleen Williamson with [employer] called to report during PCP visit 2/27 notes mentioned DOE and advised patient to follow up with cardiology. Kathleen Williamson states they made several attempts, but haven't been able to contact patient. She confirmed they have the same numbers on file, but she doesn't have any relatives to try. Kathleen Williamson states a call back to her is not necessary, but she would like someone to contact the patient to schedule + further evaluation of symptoms. Kathleen Williamson has access to [employer] and plans to continue monitoring chart.

## 2023-06-18 NOTE — Telephone Encounter (Signed)
 Was able to contact pt who reports she has had a few episodes of chest pressure - center of chest with no radiation, that last approximately 15 to 30 mins, this has occurred when is feels like she is in a hurry or pushed for time.  The episodes resolve with rest but afterwards she reports having nausea.  She denies having any trouble breathing/shortness of breath during the episodes. After about 1 hour she is back to normal.  No current signs or symptoms.  Pt has been scheduled for further evaluation 3/7 with Jari Favre, PA.  ER precautions reviewed.

## 2023-06-18 NOTE — Progress Notes (Signed)
 Cardiology Office Note:  .   Date:  06/19/2023  ID:  Kathleen Williamson, DOB [birth year], MRN [id number] PCP: Kathleen Boyden, MD  [facility] Providers Cardiologist:  Donato Schultz, MD {   History of Present Illness: .   Kathleen Williamson is a 83 y.o. female with a past medical history of CAD, HTN, HLD, vertebral artery occlusion, LBBB, diabetes, hypothyroidism, CKD, chronic HFrEF/cardiomyopathy, frequent falls, and carotid artery disease here for follow-up appointment.  History of CAD status post CABG x 5 in 2003 (LIMA to LAD, free right IMA to PDA, SVG to diagonal, SVG to OM 2/OM 5).  Unfortunately, husband passed away 2013-10-08.  Struggled in the past with grief.  Is without any children and family.  Lives at [facility] on [address] and enjoys it there.  Has a cat named saki.  Underwent total right hip arthroplasty 11/14/2020.  Underwent Lexiscan stress test which was low risk with normal heart pump function at that time.  Saw Kathleen Searing, NP 10/31/2021 and an EKG was obtained which showed LBBB which was new.  TTE was completed 11/27/2021 which revealed LVEF 30 to 35%, global hypokinesis with septal lateral dyssynchrony consistent with LBBB, G1 DD, mildly reduced RV function, no significant valve disease. Advised to start Entresto 24-26 mg twice daily, however developed diarrhea.  Resumed on lisinopril 10 mg daily.  Followed up by Dr. Anne Williamson 02/12/2022 where she was doing well with no signs of hypovolemia.  EKG at that time showed baseline T wave inversions in the precordial leads which are prolonged changes for her.  Recent TTE revealed no improvement in EF.  Dr. Anne Williamson did not recommend pursuing EP consult and 66-month follow-up was recommended.  Seen in follow-up 08/14/2022 and was doing well.  Continue to drive.  Says she gets lonely at times and eats out of boredom.  Used to socialize with neighbors but not so much anymore.  Does not exercise on a consistent basis but has an access to a gym  onsite.  Admits getting short of breath when walking quickly or up an incline.  No chest pain, lower extremity edema, fatigue, palpitations, melena, hematuria, mops this, diaphoresis, weakness, syncope/presyncope, orthopnea, or PND.  Unfortunately, did not tolerate Entresto or Farxiga in the past.  Started on spironolactone 12.5 mg daily and amiodarone was discontinued.  Encouraged to gradually increase activity and eat heart healthy diet.  Has a history of hyperkalemia.  Was advised to hold spironolactone and limit intake of potassium rich foods.  She was then seen 11/2022 follow-up and was feeling well at that time.  Walking some for exercise.  Limited by the heat.  Lots of exercise class opportunities at her retirement center but not participating.  Elevated triglycerides discussed.  No chest pain or lower extremity edema at that time.  No palpitations or syncope/presyncope.  Admitted to 11 potatoes and sweets.  Today, she presents with a history of coronary disease and elevated triglycerides, reports experiencing chest pressure when she rushes or walks fast. The pressure is localized to the chest and is accompanied by a feeling of nausea. Resting alleviates the symptoms. The patient has had a CABG with five bypasses in the past. The patient's heart pump function is not normal, with a function of 30-35%.   The patient also has some disease in the carotid arteries. The patient lives in a retirement center and has nitroglycerin spray for chest tightness.  No significant disease on carotids, would repeat next year.  Reports no shortness of breath nor dyspnea on exertion. No edema, orthopnea, PND. Reports no palpitations.   Discussed the use of AI scribe software for clinical note transcription with the patient, who gave verbal consent to proceed.  ROS: Pertinent ROS in HPI  Studies Reviewed: .        STRESS TESTS   MYOCARDIAL PERFUSION IMAGING 09/14/2020   Narrative  The left ventricular  ejection fraction is normal (55-65%).  Nuclear stress EF: 61%.  There was no ST segment deviation noted during stress.  No T wave inversion was noted during stress.  The study is normal.  This is a low risk study.   1. Normal study without ischemia or infarction. 2. Normal LVEF, 61%. 3. This is a low risk study.   ECHOCARDIOGRAM   ECHOCARDIOGRAM COMPLETE 11/27/2021   1. Left ventricular ejection fraction, by estimation, is 30 to 35%. The  left ventricle has moderately decreased function. The left ventricle  demonstrates global hypokinesis with septal-lateral dyssynchrony  consistent with LBBB. Left ventricular  diastolic parameters are consistent with Grade I diastolic dysfunction  (impaired relaxation).   2. Right ventricular systolic function is mildly reduced. The right  ventricular size is normal. Tricuspid regurgitation signal is inadequate  for assessing PA pressure.   3. The mitral valve is normal in structure. Trivial mitral valve  regurgitation. No evidence of mitral stenosis.   4. The aortic valve is tricuspid. Aortic valve regurgitation is not  visualized. No aortic stenosis is present.   5. The inferior vena cava is normal in size with greater than 50%  respiratory variability, suggesting right atrial pressure of 3 mmHg.   ECHOCARDIOGRAM COMPLETE 02/03/2022     1. Left ventricular ejection fraction, by estimation, is 30 to 35%. The left ventricle has moderately decreased function. The left ventricle demonstrates global hypokinesis. Abnormal (paradoxical) septal motion, consistent with left bundle branch block Left ventricular diastolic parameters are consistent with Grade I diastolic dysfunction (impaired relaxation). 2. Right ventricular systolic function is mildly reduced. The right ventricular size is normal. Tricuspid regurgitation signal is inadequate for assessing PA pressure. 3. The mitral valve is normal in structure. Trivial mitral valve regurgitation. No  evidence of mitral stenosis. 4. The aortic valve is tricuspid. Aortic valve regurgitation is not visualized. No aortic stenosis is present.   FINDINGS Left Ventricle: Left ventricular ejection fraction, by estimation, is 30 to 35%. The left ventricle has moderately decreased function. The left ventricle demonstrates global hypokinesis. 3D left ventricular ejection fraction analysis performed but not reported based on interpreter judgement due to suboptimal tracking. The left ventricular internal cavity size was normal in size. There is no left ventricular hypertrophy. Abnormal (paradoxical) septal motion, consistent with left bundle branch block. Left ventricular diastolic parameters are consistent with Grade I diastolic dysfunction (impaired relaxation).   Right Ventricle: The right ventricular size is normal. Right vetricular wall thickness was not well visualized. Right ventricular systolic function is mildly reduced. Tricuspid regurgitation signal is inadequate for assessing PA pressure.   Left Atrium: Left atrial size was normal in size.   Right Atrium: Right atrial size was normal in size.   Pericardium: There is no evidence of pericardial effusion.   Mitral Valve: The mitral valve is normal in structure. Trivial mitral valve regurgitation. No evidence of mitral valve stenosis.   Tricuspid Valve: The tricuspid valve is normal in structure. Tricuspid valve regurgitation is trivial.   Aortic Valve: The aortic valve is tricuspid. Aortic valve regurgitation is not visualized. No  aortic stenosis is present.   Pulmonic Valve: The pulmonic valve was not well visualized. Pulmonic valve regurgitation is trivial.   Aorta: The aortic root and ascending aorta are structurally normal, with no evidence of dilitation.   IAS/Shunts: The interatrial septum was not well visualized.   Physical Exam:   VS:  BP 130/60   Pulse 75   Ht 5' 2.5" (1.588 m)   Wt 134 lb (60.8 kg)   LMP  (LMP Unknown)    SpO2 99%   BMI 24.12 kg/m    Wt Readings from Last 3 Encounters:  06/19/23 134 lb (60.8 kg)  06/11/23 132 lb (59.9 kg)  05/13/23 134 lb (60.8 kg)    GEN: Well nourished, well developed in no acute distress NECK: No JVD; No carotid bruits CARDIAC: RRR, no murmurs, rubs, gallops RESPIRATORY:  Clear to auscultation without rales, wheezing or rhonchi  ABDOMEN: Soft, non-tender, non-distended EXTREMITIES:  No edema; No deformity   ASSESSMENT AND PLAN: .   Angina/CAD s/p CABG Intermittent chest pressure and nausea suggest angina. History of coronary artery disease with previous CABG. Ejection fraction reduced at 30-35%. Conservative approach with stress test preferred to evaluate current symptoms. Discussed nitroglycerin spray for acute chest pain. - Order stress test. - Instruct on use of nitroglycerin spray for chest pain. - Advise to seek emergency care if chest pain persists after three sprays of nitroglycerin.  Carotid Artery Disease Carotid artery disease with no recent ultrasound. Discussed need for periodic ultrasound monitoring every 3-4 years. - Review past carotid ultrasound results. (2022) - would recommend [name] in a year   Hyperlipidemia -LDL at goal but triglycerides are elevated. -continue current medications at this time but will need updated lipid panel at next visit  Hyperkalemia with spironolactone -BMP stable last month     Dispo: She can follow-up in 2 months to review results of stress test  Signed, Sharlene Dory, PA-C

## 2023-06-19 ENCOUNTER — Ambulatory Visit: Attending: Physician Assistant | Admitting: Physician Assistant

## 2023-06-19 ENCOUNTER — Encounter: Payer: Self-pay | Admitting: Physician Assistant

## 2023-06-19 VITALS — BP 130/60 | HR 75 | Ht 62.5 in | Wt 134.0 lb

## 2023-06-19 DIAGNOSIS — I447 Left bundle-branch block, unspecified: Secondary | ICD-10-CM

## 2023-06-19 DIAGNOSIS — R079 Chest pain, unspecified: Secondary | ICD-10-CM

## 2023-06-19 DIAGNOSIS — I429 Cardiomyopathy, unspecified: Secondary | ICD-10-CM | POA: Diagnosis not present

## 2023-06-19 DIAGNOSIS — E875 Hyperkalemia: Secondary | ICD-10-CM

## 2023-06-19 DIAGNOSIS — I6523 Occlusion and stenosis of bilateral carotid arteries: Secondary | ICD-10-CM | POA: Diagnosis not present

## 2023-06-19 DIAGNOSIS — I5022 Chronic systolic (congestive) heart failure: Secondary | ICD-10-CM

## 2023-06-19 DIAGNOSIS — I2510 Atherosclerotic heart disease of native coronary artery without angina pectoris: Secondary | ICD-10-CM | POA: Diagnosis not present

## 2023-06-19 DIAGNOSIS — E782 Mixed hyperlipidemia: Secondary | ICD-10-CM | POA: Diagnosis not present

## 2023-06-19 NOTE — Patient Instructions (Addendum)
 Medication Instructions:   Your physician recommends that you continue on your current medications as directed. Please refer to the Current Medication list given to you today.   *If you need a refill on your cardiac medications before your next appointment, please call your pharmacy*   Lab Work: NONE ORDERED  TODAY    If you have labs (blood work) drawn today and your tests are completely normal, you will receive your results only by: MyChart Message (if you have MyChart) OR A paper copy in the mail If you have any lab test that is abnormal or we need to change your treatment, we will call you to review the results.   Testing/Procedures: Your physician has requested that you have a lexiscan myoview. For further information please visit [email or web address] Please follow instruction sheet, as given.    Follow-Up: At [facility], you and your health needs are our priority.  As part of our continuing mission to provide you with exceptional heart care, we have created designated Provider Care Teams.  These Care Teams include your primary Cardiologist (physician) and Advanced Practice Providers (APPs -  Physician Assistants and Nurse Practitioners) who all work together to provide you with the care you need, when you need it.  We recommend signing up for the patient portal called "MyChart".  Sign up information is provided on this After Visit Summary.  MyChart is used to connect with patients for Virtual Visits (Telemedicine).  Patients are able to view lab/test results, encounter notes, upcoming appointments, etc.  Non-urgent messages can be sent to your provider as well.   To learn more about what you can do with MyChart, go to [email or web address].    Your next appointment:    2 -3 month(s)     Provider:   Jari Favre, PA-C, Robin Searing, NP, Eligha Bridegroom, NP, Tereso Newcomer, PA-C, or Perlie Gold, PA-C        Other Instructions

## 2023-06-23 ENCOUNTER — Ambulatory Visit (INDEPENDENT_AMBULATORY_CARE_PROVIDER_SITE_OTHER)

## 2023-06-23 ENCOUNTER — Ambulatory Visit: Payer: PPO

## 2023-06-23 DIAGNOSIS — M810 Age-related osteoporosis without current pathological fracture: Secondary | ICD-10-CM

## 2023-06-23 MED ORDER — DENOSUMAB 60 MG/ML ~~LOC~~ SOSY
60.0000 mg | PREFILLED_SYRINGE | Freq: Once | SUBCUTANEOUS | Status: AC
Start: 2023-12-24 — End: ?

## 2023-06-23 MED ORDER — DENOSUMAB 60 MG/ML ~~LOC~~ SOSY
60.0000 mg | PREFILLED_SYRINGE | Freq: Once | SUBCUTANEOUS | Status: AC
  Administered 2023-06-23: 60 mg via SUBCUTANEOUS

## 2023-06-23 NOTE — Progress Notes (Signed)
 Per orders of Dr. Eustaquio Boyden, injection of prolia 60 mg [place] given by Lewanda Rife in left arm. Patient tolerated injection well. Patient will make appointment for 6 month. Pt to have repeat labs in one week.

## 2023-06-30 ENCOUNTER — Encounter: Payer: Self-pay | Admitting: Pharmacist

## 2023-06-30 ENCOUNTER — Other Ambulatory Visit (INDEPENDENT_AMBULATORY_CARE_PROVIDER_SITE_OTHER): Payer: PPO

## 2023-06-30 DIAGNOSIS — M810 Age-related osteoporosis without current pathological fracture: Secondary | ICD-10-CM

## 2023-06-30 DIAGNOSIS — N183 Chronic kidney disease, stage 3 unspecified: Secondary | ICD-10-CM | POA: Diagnosis not present

## 2023-06-30 DIAGNOSIS — E1122 Type 2 diabetes mellitus with diabetic chronic kidney disease: Secondary | ICD-10-CM | POA: Diagnosis not present

## 2023-06-30 LAB — RENAL FUNCTION PANEL
Albumin: 4.2 g/dL (ref 3.5–5.2)
BUN: 17 mg/dL (ref 6–23)
CO2: 22 meq/L (ref 19–32)
Calcium: 9 mg/dL (ref 8.4–10.5)
Chloride: 108 meq/L (ref 96–112)
Creatinine, Ser: 1.54 mg/dL — ABNORMAL HIGH (ref 0.40–1.20)
GFR: 31.47 mL/min — ABNORMAL LOW (ref >60.00)
Glucose, Bld: 92 mg/dL (ref 70–99)
Phosphorus: 3 mg/dL (ref 2.3–4.6)
Potassium: 5 meq/L (ref 3.5–5.1)
Sodium: 138 meq/L (ref 135–145)

## 2023-06-30 NOTE — Progress Notes (Signed)
 Pharmacy Quality Measure Review  This patient is appearing on a report for being at risk of failing the adherence measure for diabetes medications this calendar year.   Medication: Januvia 25 mg Last fill date: 04/20/23 for 30 day supply  PCP aware and discussed w patient plan to resume.   Medication has been refilled as of 06/11/23 x30 ds.  Consider 90 ds on medication refill.

## 2023-07-02 ENCOUNTER — Ambulatory Visit (HOSPITAL_COMMUNITY): Attending: Cardiology

## 2023-07-02 DIAGNOSIS — R079 Chest pain, unspecified: Secondary | ICD-10-CM | POA: Diagnosis not present

## 2023-07-02 LAB — MYOCARDIAL PERFUSION IMAGING
LV dias vol: 110 mL (ref 46–106)
LV sys vol: 74 mL
Nuc Stress EF: 33 %
Peak HR: 97 {beats}/min
Rest HR: 74 {beats}/min
Rest Nuclear Isotope Dose: 10.3 mCi
SDS: 3
SRS: 4
SSS: 7
ST Depression (mm): 0 mm
Stress Nuclear Isotope Dose: 30 mCi
TID: 1.01

## 2023-07-02 MED ORDER — TECHNETIUM TC 99M TETROFOSMIN IV KIT
30.0000 | PACK | Freq: Once | INTRAVENOUS | Status: AC | PRN
  Administered 2023-07-02: 30 via INTRAVENOUS

## 2023-07-02 MED ORDER — REGADENOSON 0.4 MG/5ML IV SOLN
0.4000 mg | Freq: Once | INTRAVENOUS | Status: AC
  Administered 2023-07-02: 0.4 mg via INTRAVENOUS

## 2023-07-02 MED ORDER — AMINOPHYLLINE 25 MG/ML IV SOLN
75.0000 mg | Freq: Once | INTRAVENOUS | Status: AC
  Administered 2023-07-02: 75 mg via INTRAVENOUS

## 2023-07-02 MED ORDER — TECHNETIUM TC 99M TETROFOSMIN IV KIT
10.3000 | PACK | Freq: Once | INTRAVENOUS | Status: AC | PRN
  Administered 2023-07-02: 10.3 via INTRAVENOUS

## 2023-07-10 ENCOUNTER — Other Ambulatory Visit: Payer: Self-pay

## 2023-07-10 ENCOUNTER — Other Ambulatory Visit: Payer: Self-pay | Admitting: Family Medicine

## 2023-07-10 DIAGNOSIS — F332 Major depressive disorder, recurrent severe without psychotic features: Secondary | ICD-10-CM

## 2023-07-10 MED ORDER — MIRTAZAPINE 15 MG PO TABS
15.0000 mg | ORAL_TABLET | Freq: Every day | ORAL | 3 refills | Status: DC
  Filled 2023-07-10: qty 90, 90d supply, fill #0

## 2023-07-10 NOTE — Telephone Encounter (Signed)
 ERx

## 2023-07-10 NOTE — Telephone Encounter (Signed)
 Message from pt via pharmacy:  pt would like a 60 or 90 day supply of this medication please   Mirtazapine Last filled:  01/26/23, #30 Last OV:  06/11/23, mood f/u Next OV:  09/22/23, CPE

## 2023-07-10 NOTE — Addendum Note (Signed)
 Addended by: Nanci Pina on: 07/10/2023 04:30 PM   Modules accepted: Orders

## 2023-07-10 NOTE — Telephone Encounter (Signed)
 Duplicate request (see other 07/10/23 refill note).  Request unpinned.

## 2023-07-12 ENCOUNTER — Other Ambulatory Visit: Payer: Self-pay

## 2023-07-13 DIAGNOSIS — H2513 Age-related nuclear cataract, bilateral: Secondary | ICD-10-CM | POA: Diagnosis not present

## 2023-07-13 DIAGNOSIS — H5213 Myopia, bilateral: Secondary | ICD-10-CM | POA: Diagnosis not present

## 2023-07-13 DIAGNOSIS — E119 Type 2 diabetes mellitus without complications: Secondary | ICD-10-CM | POA: Diagnosis not present

## 2023-07-13 LAB — HM DIABETES EYE EXAM

## 2023-07-17 ENCOUNTER — Other Ambulatory Visit (HOSPITAL_COMMUNITY): Payer: Self-pay

## 2023-08-04 ENCOUNTER — Other Ambulatory Visit: Payer: Self-pay | Admitting: Cardiology

## 2023-08-06 ENCOUNTER — Other Ambulatory Visit: Payer: Self-pay | Admitting: Family Medicine

## 2023-08-06 NOTE — Telephone Encounter (Signed)
 Rx sent 06/11/23, #12 g/1 refills to [facility]   Attempted to contact pt. No answer, no vm. Need to know if pt actually needs refill or if request automatically sent from pharmacy.

## 2023-08-07 NOTE — Telephone Encounter (Signed)
 ERx

## 2023-08-14 ENCOUNTER — Telehealth: Payer: Self-pay

## 2023-08-14 NOTE — Telephone Encounter (Signed)
 Copied from CRM [id number]. Topic: Clinical - Lab/Test Results >> Aug 14, 2023  8:52 AM Dimple Francis wrote: Reason for CRM: Aurelia Leeks [phone]- [employer]-  PHQ score-10- No self harm

## 2023-08-14 NOTE — Telephone Encounter (Signed)
 Noted. Pt is on treatment with antidepressant.    06/11/2023   10:47 AM 05/13/2023    3:10 PM 12/22/2022   11:39 AM 09/05/2022   10:15 AM 06/25/2022    2:43 PM  Depression screen PHQ 2/9  Decreased Interest 2 0 1 0 0  Down, Depressed, Hopeless 2 0 1 2 1   PHQ - 2 Score 4 0 2 2 1   Altered sleeping 1 0 2  0  Tired, decreased energy 1 0 2  0  Change in appetite 2 0 0  0  Feeling bad or failure about yourself  0 0 0  0  Trouble concentrating 0 0 0  0  Moving slowly or fidgety/restless 0 0 0  0  Suicidal thoughts 0 0 0  0  PHQ-9 Score 8 0 6  1  Difficult doing work/chores Somewhat difficult Not difficult at all Not difficult at all  Not difficult at all

## 2023-08-25 NOTE — Progress Notes (Signed)
 "    Cardiology Office Note:    Date:  08/26/2023  ID:  Kathleen Williamson, DOB 07/05/1940, MRN [id number] PCP: Rilla Baller, MD  [facility] Providers Cardiologist:  Oneil Parchment, MD       Patient Profile:      Coronary artery disease s/p CABG in 2003 L-LAD, Free RIMA-PDA, S-Dx, S-OM2-OM5 Lexiscan  MPI 09/14/20: no ischemia or infarction, EF 61, low risk Lexiscan  MPI 07/02/23: no ischemia or infarction, EF 33; high risk due to low EF  (HFrEF) heart failure with reduced ejection fraction Intol of ARNI (diarrhea)  TTE 11/27/21: EF 30-35, Gr 1 dD, mildly reduced RVSF, trivial MR TTE 02/03/22: EF 30-35, global HK, Gr 1 DD, mildly reduced RVSF, trivial MR Left Bundle Branch Block  Diabetes mellitus  Chronic kidney disease  Hypertension  Hyperlipidemia  Carotid artery disease US  06/25/20: no significant ICA stenosis bilat; +L VA occlusion Vertebral artery occlusion Hypothyroidism  Frequent falls           Discussed the use of AI scribe software for clinical note transcription with the patient, who gave verbal consent to proceed.  History of Present Illness Kathleen Williamson is a 83 y.o. female who returns for follow up of CAD, CHF. She was last seen in clinic by Orren Fabry, PA-C in 06/2023 with symptoms of chest pain. Nuclear stress test was obtained and demonstrated no ischemia.   She is here alone. She notes exertional chest pressure described as a 'heavy pressure' during hurried activities or fast walking, sometimes accompanied by nausea. The symptoms subside shortly after onset. She has not experienced chest pressure since March 2025 and carries nitroglycerin , though she has not used it. She avoids activities that might provoke symptoms. She has not had leg swelling, and she can climb stairs slowly without needing to stop for breath.     ROS-See HPI       Studies Reviewed:       Results LABS Potassium: 5 (06/30/2023) Creatinine: 1.54 (06/30/2023) ALT: 18 (09/15/2022) Total  cholesterol: 154 (11/05/2022) HDL: 41 (11/05/2022) Triglycerides: 236 (11/05/2022) LDL: 74 (11/05/2022) Hemoglobin: 11.8 (09/15/2022)    Risk Assessment/Calculations:             Physical Exam:   VS:  BP 130/60   Pulse 88   Ht 5' 3 (1.6 m)   Wt 131 lb 3.2 oz (59.5 kg)   LMP  (LMP Unknown)   SpO2 98%   BMI 23.24 kg/m    Wt Readings from Last 3 Encounters:  08/26/23 131 lb 3.2 oz (59.5 kg)  07/02/23 134 lb (60.8 kg)  06/19/23 134 lb (60.8 kg)    Constitutional:      Appearance: Healthy appearance. Not in distress.  Neck:     Vascular: JVD normal.  Pulmonary:     Breath sounds: Normal breath sounds. No wheezing. No rales.  Cardiovascular:     Normal rate. Regular rhythm.     Murmurs: There is no murmur.  Edema:    Peripheral edema absent.  Abdominal:     Palpations: Abdomen is soft.        Assessment and Plan:   Assessment & Plan Coronary artery disease involving native coronary artery of native heart with angina pectoris (HCC) Status post-CABG in 2003. Recent nuclear stress test demonstrated no ischemia or infarction, high risk due to low EF.  She describes symptoms of CCS class II angina.  We discussed pursuing medical therapy given her stress test that demonstrated no ischemia.  She is already on amlodipine .  I reviewed her notes.  Beta-blockers have been avoided in the past secondary to left bundle branch block.  - Start Imdur  30 mg daily, take 15 mg daily for two weeks then increase to 30 mg daily - Continue amlodipine  2.5 mg daily - Continue Lipitor 40 mg daily - Continue Zetia  10 mg daily - Continue lisinopril  10 mg daily - Continue nitroglycerin  PRN - Follow up in three months - Consider cardiac catheterization if symptoms progress Chronic HFrEF (heart failure with reduced ejection fraction) (HCC) EF 30-35% by echocardiogram in October 2023. She is not on beta blocker due to concerns for conduction system disease.  She could not tolerate Entresto . -  Continue lisinopril  10 mg daily - Consider addition of SGLT2 inhibitor at next visit Essential hypertension Blood pressure well-controlled. Continue current medication regimen. - Continue amlodipine  2.5 mg daily - Continue lisinopril  10 mg daily Mixed dyslipidemia LDL close to goal in July 2024 at 55. Triglycerides were elevated at 236. Continue current medication regimen. Arrange fasting lipid panel at next visit. - Continue Lipitor 40 mg daily - Continue Zetia  10 mg daily - Arrange fasting lipid panel at next visit          Dispo:  Return in about 3 months (around 11/26/2023) for Routine Follow Up, w/ Dr. Jeffrie.  Signed, Glendia Ferrier, PA-C   "

## 2023-08-26 ENCOUNTER — Encounter: Payer: Self-pay | Admitting: Physician Assistant

## 2023-08-26 ENCOUNTER — Ambulatory Visit: Attending: Physician Assistant | Admitting: Physician Assistant

## 2023-08-26 VITALS — BP 130/60 | HR 88 | Ht 63.0 in | Wt 131.2 lb

## 2023-08-26 DIAGNOSIS — I5022 Chronic systolic (congestive) heart failure: Secondary | ICD-10-CM

## 2023-08-26 DIAGNOSIS — I10 Essential (primary) hypertension: Secondary | ICD-10-CM | POA: Diagnosis not present

## 2023-08-26 DIAGNOSIS — E782 Mixed hyperlipidemia: Secondary | ICD-10-CM | POA: Diagnosis not present

## 2023-08-26 DIAGNOSIS — I25119 Atherosclerotic heart disease of native coronary artery with unspecified angina pectoris: Secondary | ICD-10-CM

## 2023-08-26 DIAGNOSIS — I2510 Atherosclerotic heart disease of native coronary artery without angina pectoris: Secondary | ICD-10-CM

## 2023-08-26 MED ORDER — ISOSORBIDE MONONITRATE ER 30 MG PO TB24: ORAL_TABLET | ORAL | 11 refills | Status: AC

## 2023-08-26 NOTE — Assessment & Plan Note (Signed)
 Status post-CABG in 2003. Recent nuclear stress test demonstrated no ischemia or infarction, high risk due to low EF.  She describes symptoms of CCS class II angina.  We discussed pursuing medical therapy given her stress test that demonstrated no ischemia.  She is already on amlodipine .  I reviewed her notes.  Beta-blockers have been avoided in the past secondary to left bundle branch block.  - Start Imdur 30 mg daily, take 15 mg daily for two weeks then increase to 30 mg daily - Continue amlodipine  2.5 mg daily - Continue Lipitor 40 mg daily - Continue Zetia  10 mg daily - Continue lisinopril  10 mg daily - Continue nitroglycerin  PRN - Follow up in three months - Consider cardiac catheterization if symptoms progress

## 2023-08-26 NOTE — Assessment & Plan Note (Signed)
 EF 30-35% by echocardiogram in October 2023. She is not on beta blocker due to concerns for conduction system disease.  She could not tolerate Entresto . - Continue lisinopril  10 mg daily - Consider addition of SGLT2 inhibitor at next visit

## 2023-08-26 NOTE — Patient Instructions (Signed)
 Medication Instructions:  Your physician has recommended you make the following change in your medication:  START Isosorbide (Imdur) 30 mg taking 1/2 tablet daily for 2 weeks then increase it to a whole tablet   *If you need a refill on your cardiac medications before your next appointment, please call your pharmacy*  Lab Work: None ordered  If you have labs (blood work) drawn today and your tests are completely normal, you will receive your results only by: MyChart Message (if you have MyChart) OR A paper copy in the mail If you have any lab test that is abnormal or we need to change your treatment, we will call you to review the results.  Testing/Procedures: None ordered  Follow-Up: At [facility], you and your health needs are our priority.  As part of our continuing mission to provide you with exceptional heart care, our providers are all part of one team.  This team includes your primary Cardiologist (physician) and Advanced Practice Providers or APPs (Physician Assistants and Nurse Practitioners) who all work together to provide you with the care you need, when you need it.  Your next appointment:   3 month(s)  Provider:   Dorothye Gathers, MD    We recommend signing up for the patient portal called "MyChart".  Sign up information is provided on this After Visit Summary.  MyChart is used to connect with patients for Virtual Visits (Telemedicine).  Patients are able to view lab/test results, encounter notes, upcoming appointments, etc.  Non-urgent messages can be sent to your provider as well.   To learn more about what you can do with MyChart, go to [email or web address].   Other Instructions

## 2023-09-03 ENCOUNTER — Other Ambulatory Visit: Payer: Self-pay | Admitting: Family Medicine

## 2023-09-03 NOTE — Telephone Encounter (Signed)
 Nitroglycerin  spray Last filled:  08/07/23, #12 g Last OV:  06/11/23, mood f/u Next OV:  09/22/23, CPE

## 2023-09-08 ENCOUNTER — Ambulatory Visit (INDEPENDENT_AMBULATORY_CARE_PROVIDER_SITE_OTHER): Payer: PPO

## 2023-09-08 VITALS — BP 130/60 | Ht 63.0 in | Wt 131.0 lb

## 2023-09-08 DIAGNOSIS — Z0000 Encounter for general adult medical examination without abnormal findings: Secondary | ICD-10-CM | POA: Diagnosis not present

## 2023-09-08 NOTE — Telephone Encounter (Signed)
 ERx

## 2023-09-08 NOTE — Progress Notes (Signed)
 Because this visit was a virtual/telehealth visit,  certain criteria was not obtained, such a blood pressure, CBG if applicable, and timed get up and go. Any medications not marked as "taking" were not mentioned during the medication reconciliation part of the visit. Any vitals not documented were not able to be obtained due to this being a telehealth visit or patient was unable to self-report a recent blood pressure reading due to a lack of equipment at home via telehealth. Vitals that have been documented are verbally provided by the patient. This visit was performed by a medical professional under my direct supervision. I was immediately available for consultation/collaboration. I have reviewed and agree with the Annual Wellness Visit documentation.  Subjective:   Kathleen Williamson is a 83 y.o. who presents for a Medicare Wellness preventive visit.  As a reminder, Annual Wellness Visits don't include a physical exam, and some assessments may be limited, especially if this visit is performed virtually. We may recommend an in-person follow-up visit with your provider if needed.  Visit Complete: Virtual I connected with  Kathleen Williamson on 09/08/23 by a audio enabled telemedicine application and verified that I am speaking with the correct person using two identifiers.  Patient Location: Home  Provider Location: Home Office  I discussed the limitations of evaluation and management by telemedicine. The patient expressed understanding and agreed to proceed.  Vital Signs: Because this visit was a virtual/telehealth visit, some criteria may be missing or patient reported. Any vitals not documented were not able to be obtained and vitals that have been documented are patient reported.  VideoDeclined- This patient declined Librarian, academic. Therefore the visit was completed with audio only.  Persons Participating in Visit: Patient.  AWV Questionnaire: No: Patient Medicare  AWV questionnaire was not completed prior to this visit.  Cardiac Risk Factors include: advanced age (>78men, >18 women);diabetes mellitus;hypertension;dyslipidemia     Objective:     Today's Vitals   09/08/23 1110  BP: 130/60  Weight: 131 lb (59.4 kg)  Height: 5\' 3"  (1.6 m)   Body mass index is 23.21 kg/m.     09/08/2023   11:08 AM 09/05/2022   10:25 AM 11/14/2020    3:30 PM 11/06/2020    1:25 PM 10/29/2020    3:39 PM 10/26/2020    1:03 PM 10/06/2020    6:08 PM  Advanced Directives  Does Patient Have a Medical Advance Directive? No Yes No No No No No  Type of Furniture conservator/restorer;Living will       Does patient want to make changes to medical advance directive?  No - Patient declined       Copy of Healthcare Power of Attorney in Chart?  No - copy requested       Would patient like information on creating a medical advance directive? No - Patient declined No - Patient declined No - Patient declined No - Patient declined No - Patient declined      Current Medications (verified) Outpatient Encounter Medications as of 09/08/2023  Medication Sig   Accu-Chek FastClix Lancets MISC USE AS INSTRUCTED TO CHECK BLOOD SUGAR ONCE DAILY   acetaminophen  (TYLENOL ) 325 MG tablet Take 650 mg by mouth every 6 (six) hours as needed for moderate pain or headache.   amLODipine  (NORVASC ) 2.5 MG tablet Take 1 tablet (2.5 mg total) by mouth every morning.   aspirin  EC 81 MG tablet Take 81 mg by mouth daily. Swallow whole.  atorvastatin  (LIPITOR) 40 MG tablet Take 1 tablet (40 mg total) by mouth at bedtime.   Blood Glucose Monitoring Suppl (ACCU-CHEK GUIDE) w/Device KIT 1 each by Does not apply route as directed. Use as instructed to check blood sugar once daily   Cholecalciferol (VITAMIN D3) 25 MCG (1000 UT) CAPS Take 2 capsules (2,000 Units total) by mouth daily.   Coenzyme Q10 (CO Q-10) 100 MG CAPS Take 100 mg by mouth at bedtime.   cyanocobalamin  (VITAMIN B12) 1000 MCG  tablet Take 1 tablet (1,000 mcg total) by mouth once a week.   denosumab  (PROLIA ) 60 MG/ML SOSY injection Inject 60 mg into the skin every 6 (six) months.   ezetimibe  (ZETIA ) 10 MG tablet TAKE 1 TABLET BY MOUTH DAILY   glucose blood (ONETOUCH VERIO) test strip Use as instructed to check blood sugar once a day   isosorbide  mononitrate (IMDUR ) 30 MG 24 hr tablet Take 1/2 tab (15 mg) once daily for 2 weeks, then increase to 1 tab (30 mg) once daily   levothyroxine  (SYNTHROID ) 50 MCG tablet Take 1 tablet (50 mcg total) by mouth daily.   lisinopril  (ZESTRIL ) 10 MG tablet Take 1 tablet (10 mg total) by mouth daily.   loperamide (IMODIUM A-D) 2 MG tablet Take 2 mg by mouth 4 (four) times daily as needed for diarrhea or loose stools.   mirtazapine  (REMERON ) 15 MG tablet Take 1 tablet (15 mg total) by mouth at bedtime.   sitaGLIPtin  (JANUVIA ) 25 MG tablet Take 1 tablet (25 mg total) by mouth daily.   venlafaxine  XR (EFFEXOR -XR) 75 MG 24 hr capsule Take 3 capsules (225 mg total) by mouth every morning.   [DISCONTINUED] nitroGLYCERIN  (NITROLINGUAL ) 0.4 MG/SPRAY spray PLACE 1 SPRAY UNDER THE TONGUE EVERY 5 (FIVE) MINUTES AS NEEDED.   Facility-Administered Encounter Medications as of 09/08/2023  Medication   denosumab  (PROLIA ) injection 60 mg   [START ON 12/24/2023] denosumab  (PROLIA ) injection 60 mg    Allergies (verified) Sulfa antibiotics and Crestor  [rosuvastatin  calcium ]   History: Past Medical History:  Diagnosis Date   Arthritis    fingers   CAD (coronary artery disease) 2003   MI s/p 5v CABG   Carotid stenosis    [place]   CKD (chronic kidney disease) stage 3, GFR 30-59 ml/min (HCC)    Complicated UTI (urinary tract infection) 10/31/2020   Dermatomyositis (HCC)    saw Dr Alvira Josephs, improved on its own   Diabetes mellitus without complication (HCC)    Forehead laceration 09/15/2017   History of chicken pox    History of measles    History of migraine none since 2003   HLD  (hyperlipidemia)    Hypothyroidism    MDD (major depressive disorder), recurrent episode, moderate (HCC)    h/o SI after lost husband unexpectedly 2015   Microscopic colitis 05/2014   by colonoscopy, lymphocytic, zoloft  related   Myocardial infarction (HCC) 2003   Osteoporosis    DEXA 03/2013 T -3.1, DEXA 10//2016 -2.9 hip, -2.2 spine   Pancreatic cyst    Dr Purcell Bruce, no f/u needed   Prediabetes 2014   Subclavian steal syndrome    Urine incontinence    Past Surgical History:  Procedure Laterality Date   APPENDECTOMY  1997   with hysterectomy   BACK SURGERY  1981   lower   BREAST LUMPECTOMY Left 1977   benign   COLONOSCOPY WITH PROPOFOL  N/A 05/23/2014   microscopic colitis - Garrett Kallman, MD   CORONARY ARTERY BYPASS GRAFT  2003  x 5 v   ESOPHAGOGASTRODUODENOSCOPY (EGD) WITH PROPOFOL  N/A 05/23/2014   WNL Garrett Kallman, MD   ROTATOR CUFF REPAIR Right [phone]   TOOTH EXTRACTION  yrs ago   TOTAL ABDOMINAL HYSTERECTOMY W/ BILATERAL SALPINGOOPHORECTOMY  1997   endometriosis   TOTAL HIP ARTHROPLASTY Right 11/14/2020   Procedure: TOTAL HIP ARTHROPLASTY ANTERIOR APPROACH;  Surgeon: Liliane Rei, MD;  Location: [facility];  Service: Orthopedics;  Laterality: Right;   Family History  Problem Relation Age of Onset   CAD Father 45   CAD Mother 84   Diabetes Mother    Rheum arthritis Mother    Cancer Maternal Aunt        breast   CAD Other        strong paternal side   Stroke Neg Hx    Social History   Socioeconomic History   Marital status: Widowed    Spouse name: Not on file   Number of children: 0   Years of education: Not on file   Highest education level: Not on file  Occupational History   Occupation: Retired  Tobacco Use   Smoking status: Never   Smokeless tobacco: Never  Vaping Use   Vaping status: Never Used  Substance and Sexual Activity   Alcohol use: No   Drug use: No   Sexual activity: Not Currently  Other Topics Concern   Not on file  Social History  Narrative   Lives alone at [place].   Husband of 47 yrs died suddenly 09/15/13.   No children, no siblings.    Close friends Marily Shows and [employer] nearby, cousin in [place].   Activity: going to Y   Social Drivers of Health   Financial Resource Strain: Low Risk  (09/08/2023)   Overall Financial Resource Strain (CARDIA)    Difficulty of Paying Living Expenses: Not hard at all  Food Insecurity: No Food Insecurity (09/08/2023)   Hunger Vital Sign    Worried About Running Out of Food in the Last Year: Never true    Ran Out of Food in the Last Year: Never true  Transportation Needs: No Transportation Needs (09/08/2023)   PRAPARE - Administrator, Civil Service (Medical): No    Lack of Transportation (Non-Medical): No  Physical Activity: Insufficiently Active (09/08/2023)   Exercise Vital Sign    Days of Exercise per Week: 7 days    Minutes of Exercise per Session: 20 min  Stress: No Stress Concern Present (09/08/2023)   [employer] of Occupational Health - Occupational Stress Questionnaire    Feeling of Stress : Not at all  Social Connections: Moderately Integrated (09/08/2023)   Social Connection and Isolation Panel [NHANES]    Frequency of Communication with Friends and Family: More than three times a week    Frequency of Social Gatherings with Friends and Family: Once a week    Attends Religious Services: More than 4 times per year    Active Member of [employer] or Organizations: Yes    Attends Banker Meetings: Never    Marital Status: Widowed    Tobacco Counseling Counseling given: Not Answered    Clinical Intake:  Pre-visit preparation completed: Yes  Pain : No/denies pain     BMI - recorded: 23.21 Nutritional Status: BMI of 19-24  Normal Nutritional Risks: None Diabetes: Yes CBG done?: No Did pt. bring in CBG monitor from home?: No  Lab Results  Component Value Date   HGBA1C 6.7 (A) 06/11/2023  HGBA1C 7.7 (A) 12/22/2022   HGBA1C 7.3  (H) 09/15/2022     How often do you need to have someone help you when you read instructions, pamphlets, or other written materials from your doctor or pharmacy?: 1 - Never What is the last grade level you completed in school?: 12 years  Interpreter Needed?: No  Information entered by :: Gailya Tauer,cma   Activities of Daily Living     09/08/2023   11:17 AM  In your present state of health, do you have any difficulty performing the following activities:  Hearing? 0  Vision? 0  Difficulty concentrating or making decisions? 0  Walking or climbing stairs? 0  Dressing or bathing? 0  Doing errands, shopping? 0  Preparing Food and eating ? N  Using the Toilet? N  In the past six months, have you accidently leaked urine? N  Do you have problems with loss of bowel control? Y  Managing your Medications? N  Managing your Finances? N  Housekeeping or managing your Housekeeping? N    Patient Care Team: Claire Crick, MD as PCP - General (Family Medicine) Hugh Madura, MD as PCP - Cardiology (Cardiology) Dingeldein, Landon Pinion, MD as Referring Physician (Ophthalmology) Jonathan Neighbor, [facility] (Inactive) as Pharmacist (Pharmacist)  Indicate any recent Medical Services you may have received from other than [facility] providers in the past year (date may be approximate).     Assessment:    This is a routine wellness examination for Kathleen Williamson.  Hearing/Vision screen Hearing Screening - Comments:: No hearing aids Vision Screening - Comments:: Patient wears glasses. Patient dee dr Annett Killer   Goals Addressed             This Visit's Progress    Patient Stated       To feel better        Depression Screen     09/08/2023   11:18 AM 06/11/2023   10:47 AM 05/13/2023    3:10 PM 12/22/2022   11:39 AM 09/05/2022   10:15 AM 06/25/2022    2:43 PM 09/17/2021    4:12 PM  PHQ 2/9 Scores  PHQ - 2 Score 3 4 0 2 2 1 2   PHQ- 9 Score 4 8 0 6  1 3     Fall Risk     09/08/2023   11:16 AM  06/11/2023   10:47 AM 05/13/2023    3:10 PM 12/22/2022   11:39 AM 09/05/2022   10:17 AM  Fall Risk   Falls in the past year? 0 0 1 1 0  Number falls in past yr: 0  0  0  Injury with Fall? 0  0 1 0  Risk for fall due to : No Fall Risks  History of fall(s)  No Fall Risks  Follow up Falls evaluation completed  Falls evaluation completed  Falls evaluation completed    MEDICARE RISK AT HOME:  Medicare Risk at Home Any stairs in or around the home?: Yes If so, are there any without handrails?: No Home free of loose throw rugs in walkways, pet beds, electrical cords, etc?: Yes Adequate lighting in your home to reduce risk of falls?: Yes Life alert?: No Use of a cane, walker or w/c?: No Grab bars in the bathroom?: Yes Shower chair or bench in shower?: Yes Elevated toilet seat or a handicapped toilet?: No  TIMED UP AND GO:  Was the test performed?  no  Cognitive Function: 6CIT completed    02/16/2018    9:49  AM 02/10/2017    8:59 AM 02/04/2016    9:00 AM  MMSE - Mini Mental State Exam  Orientation to time 5 5 5   Orientation to Place 5 5 5   Registration 3 3 3   Attention/ Calculation 0 0 0  Recall 3 3 3   Language- name 2 objects 0 0 0  Language- repeat 1 1 1   Language- follow 3 step command 3 3 3   Language- read & follow direction 0 0 0  Write a sentence 0 0 0  Copy design 0 0 0  Total score 20 20 20         09/08/2023   11:14 AM 09/05/2022   10:21 AM  6CIT Screen  What Year? 0 points 0 points  What month? 0 points 0 points  What time? 0 points 0 points  Count back from 20 0 points 0 points  Months in reverse 0 points 0 points  Repeat phrase 0 points 0 points  Total Score 0 points 0 points    Immunizations Immunization History  Administered Date(s) Administered   Fluad Quad(high Dose 65+) 12/28/2018, 01/16/2020, 02/26/2022   Fluad Trivalent(High Dose 65+) 12/30/2022   Influenza, High Dose Seasonal PF 12/18/2017, 12/23/2018, 01/23/2020   Influenza,inj,Quad PF,6+ Mos  01/22/2015, 02/04/2016, 12/19/2016   Influenza-Unspecified 04/01/2011   Moderna Covid-19 Fall Seasonal Vaccine 87yrs & older 02/21/2022, 07/22/2022   Moderna SARS-COV2 Booster Vaccination 02/24/2020, 08/30/2020   Moderna Sars-Covid-2 Vaccination 04/29/2019, 05/27/2019   PPD Test 11/30/2020   Pfizer Covid-19 Vaccine Bivalent Booster 72yrs & up 01/03/2021   Pneumococcal Conjugate-13 02/04/2016   Pneumococcal-Unspecified 03/24/2007   Td 03/24/2007, 09/05/2016   Tdap 09/09/2017, 01/25/2018, 09/12/2019   Zoster Recombinant(Shingrix) 02/20/2017, 05/02/2017   Zoster, Live 04/14/2009    Screening Tests Health Maintenance  Topic Date Due   Pneumonia Vaccine 13+ Years old (2 of 2 - PPSV23) 03/31/2016   MAMMOGRAM  06/23/2020   COVID-19 Vaccine (8 - 2024-25 season) 12/14/2022   Diabetic kidney evaluation - Urine ACR  09/19/2023   INFLUENZA VACCINE  11/13/2023   HEMOGLOBIN A1C  12/09/2023   FOOT EXAM  12/22/2023   Diabetic kidney evaluation - eGFR measurement  06/29/2024   OPHTHALMOLOGY EXAM  07/12/2024   Medicare Annual Wellness (AWV)  09/07/2024   DTaP/Tdap/Td (6 - Td or Tdap) 09/11/2029   DEXA SCAN  Completed   Zoster Vaccines- Shingrix  Completed   HPV VACCINES  Aged Out   Meningococcal B Vaccine  Aged Out   Hepatitis C Screening  Discontinued    Health Maintenance  Health Maintenance Due  Topic Date Due   Pneumonia Vaccine 36+ Years old (2 of 2 - PPSV23) 03/31/2016   MAMMOGRAM  06/23/2020   COVID-19 Vaccine (8 - 2024-25 season) 12/14/2022   Diabetic kidney evaluation - Urine ACR  09/19/2023   Health Maintenance Items Addressed:patient declined health maintenance  Additional Screening:  Vision Screening: Recommended annual ophthalmology exams for early detection of glaucoma and other disorders of the eye.  Dental Screening: Recommended annual dental exams for proper oral hygiene  Community Resource Referral / Chronic Care Management: CRR required this visit?  No   CCM  required this visit?  No   Plan:    I have personally reviewed and noted the following in the patient's chart:   Medical and social history Use of alcohol, tobacco or illicit drugs  Current medications and supplements including opioid prescriptions. Patient is not currently taking opioid prescriptions. Functional ability and status Nutritional status Physical activity Advanced directives  List of other physicians Hospitalizations, surgeries, and ER visits in previous 12 months Vitals Screenings to include cognitive, depression, and falls Referrals and appointments  In addition, I have reviewed and discussed with patient certain preventive protocols, quality metrics, and best practice recommendations. A written personalized care plan for preventive services as well as general preventive health recommendations were provided to patient.   Kathleen Williamson, [place]   09/08/2023   After Visit Summary: (MyChart) Due to this being a telephonic visit, the after visit summary with patients personalized plan was offered to patient via MyChart   Notes: Nothing significant to report at this time.

## 2023-09-08 NOTE — Patient Instructions (Signed)
 Kathleen Williamson , Thank you for taking time out of your busy schedule to complete your Annual Wellness Visit with me. I enjoyed our conversation and look forward to speaking with you again next year. I, as well as your care team,  appreciate your ongoing commitment to your health goals. Please review the following plan we discussed and let me know if I can assist you in the future. Your Game plan/ To Do List    Referrals:none  Follow up Visits: Next Medicare AWV with our clinical staff: 09/08/2024   Have you seen your provider in the last 6 months (3 months if uncontrolled diabetes)? Yes Next Office Visit with your provider: 09/22/2023  Clinician Recommendations:  Aim for 30 minutes of exercise or brisk walking, 6-8 glasses of water , and 5 servings of fruits and vegetables each day.       This is a list of the screening recommended for you and due dates:  Health Maintenance  Topic Date Due   Pneumonia Vaccine (2 of 2 - PPSV23) 03/31/2016   Mammogram  06/23/2020   COVID-19 Vaccine (8 - 2024-25 season) 12/14/2022   Yearly kidney health urinalysis for diabetes  09/19/2023   Flu Shot  11/13/2023   Hemoglobin A1C  12/09/2023   Complete foot exam   12/22/2023   Yearly kidney function blood test for diabetes  06/29/2024   Eye exam for diabetics  07/12/2024   Medicare Annual Wellness Visit  09/07/2024   DTaP/Tdap/Td vaccine (6 - Td or Tdap) 09/11/2029   DEXA scan (bone density measurement)  Completed   Zoster (Shingles) Vaccine  Completed   HPV Vaccine  Aged Out   Meningitis B Vaccine  Aged Out   Hepatitis C Screening  Discontinued    Advanced directives: (Copy Requested) Please bring a copy of your health care power of attorney and living will to the office to be added to your chart at your convenience. You can mail to [facility] [address] [place], [place] [address] or email to [email or web address] Advance Care Planning is important because it:  [x]  Makes sure  you receive the medical care that is consistent with your values, goals, and preferences  [x]  It provides guidance to your family and loved ones and reduces their decisional burden about whether or not they are making the right decisions based on your wishes.  Follow the link provided in your after visit summary or read over the paperwork we have mailed to you to help you started getting your Advance Directives in place. If you need assistance in completing these, please reach out to us  so that we can help you!  See attachments for Preventive Care and Fall Prevention Tips.

## 2023-09-14 ENCOUNTER — Other Ambulatory Visit: Payer: Self-pay | Admitting: Family Medicine

## 2023-09-14 DIAGNOSIS — E039 Hypothyroidism, unspecified: Secondary | ICD-10-CM

## 2023-09-14 DIAGNOSIS — E118 Type 2 diabetes mellitus with unspecified complications: Secondary | ICD-10-CM

## 2023-09-14 DIAGNOSIS — E538 Deficiency of other specified B group vitamins: Secondary | ICD-10-CM

## 2023-09-14 DIAGNOSIS — M810 Age-related osteoporosis without current pathological fracture: Secondary | ICD-10-CM

## 2023-09-14 DIAGNOSIS — M3313 Other dermatomyositis without myopathy: Secondary | ICD-10-CM

## 2023-09-14 DIAGNOSIS — E1122 Type 2 diabetes mellitus with diabetic chronic kidney disease: Secondary | ICD-10-CM

## 2023-09-14 DIAGNOSIS — E1169 Type 2 diabetes mellitus with other specified complication: Secondary | ICD-10-CM

## 2023-09-15 ENCOUNTER — Other Ambulatory Visit (INDEPENDENT_AMBULATORY_CARE_PROVIDER_SITE_OTHER): Payer: PPO

## 2023-09-15 DIAGNOSIS — M810 Age-related osteoporosis without current pathological fracture: Secondary | ICD-10-CM | POA: Diagnosis not present

## 2023-09-15 DIAGNOSIS — M3313 Other dermatomyositis without myopathy: Secondary | ICD-10-CM

## 2023-09-15 DIAGNOSIS — E785 Hyperlipidemia, unspecified: Secondary | ICD-10-CM

## 2023-09-15 DIAGNOSIS — E538 Deficiency of other specified B group vitamins: Secondary | ICD-10-CM

## 2023-09-15 DIAGNOSIS — E118 Type 2 diabetes mellitus with unspecified complications: Secondary | ICD-10-CM

## 2023-09-15 DIAGNOSIS — E1122 Type 2 diabetes mellitus with diabetic chronic kidney disease: Secondary | ICD-10-CM

## 2023-09-15 DIAGNOSIS — N183 Chronic kidney disease, stage 3 unspecified: Secondary | ICD-10-CM

## 2023-09-15 DIAGNOSIS — E039 Hypothyroidism, unspecified: Secondary | ICD-10-CM | POA: Diagnosis not present

## 2023-09-15 DIAGNOSIS — E1169 Type 2 diabetes mellitus with other specified complication: Secondary | ICD-10-CM

## 2023-09-15 LAB — COMPREHENSIVE METABOLIC PANEL WITH GFR
ALT: 16 U/L (ref 0–35)
AST: 13 U/L (ref 0–37)
Albumin: 4.2 g/dL (ref 3.5–5.2)
Alkaline Phosphatase: 47 U/L (ref 39–117)
BUN: 22 mg/dL (ref 6–23)
CO2: 23 meq/L (ref 19–32)
Calcium: 9.2 mg/dL (ref 8.4–10.5)
Chloride: 104 meq/L (ref 96–112)
Creatinine, Ser: 1.66 mg/dL — ABNORMAL HIGH (ref 0.40–1.20)
GFR: 28.72 mL/min — ABNORMAL LOW (ref >60.00)
Glucose, Bld: 170 mg/dL — ABNORMAL HIGH (ref 70–99)
Potassium: 4.9 meq/L (ref 3.5–5.1)
Sodium: 136 meq/L (ref 135–145)
Total Bilirubin: 0.5 mg/dL (ref 0.2–1.2)
Total Protein: 6.6 g/dL (ref 6.0–8.3)

## 2023-09-15 LAB — HEMOGLOBIN A1C: Hgb A1c MFr Bld: 7.3 % — ABNORMAL HIGH (ref 4.6–6.5)

## 2023-09-15 LAB — CBC WITH DIFFERENTIAL/PLATELET
Basophils Absolute: 0.1 10*3/uL (ref 0.0–0.1)
Basophils Relative: 0.8 % (ref 0.0–3.0)
Eosinophils Absolute: 0.3 10*3/uL (ref 0.0–0.7)
Eosinophils Relative: 3.4 % (ref 0.0–5.0)
HCT: 35 % — ABNORMAL LOW (ref 36.0–46.0)
Hemoglobin: 11.7 g/dL — ABNORMAL LOW (ref 12.0–15.0)
Lymphocytes Relative: 30.2 % (ref 12.0–46.0)
Lymphs Abs: 2.3 10*3/uL (ref 0.7–4.0)
MCHC: 33.4 g/dL (ref 30.0–36.0)
MCV: 85.3 fl (ref 78.0–100.0)
Monocytes Absolute: 0.6 10*3/uL (ref 0.1–1.0)
Monocytes Relative: 7.6 % (ref 3.0–12.0)
Neutro Abs: 4.3 10*3/uL (ref 1.4–7.7)
Neutrophils Relative %: 58 % (ref 43.0–77.0)
Platelets: 283 10*3/uL (ref 150.0–400.0)
RBC: 4.1 Mil/uL (ref 3.87–5.11)
RDW: 13.9 % (ref 11.5–15.5)
WBC: 7.5 10*3/uL (ref 4.0–10.5)

## 2023-09-15 LAB — TSH: TSH: 0.86 u[IU]/mL (ref 0.35–5.50)

## 2023-09-15 LAB — CK: Total CK: 38 U/L (ref 7–177)

## 2023-09-15 LAB — LIPID PANEL
Cholesterol: 145 mg/dL (ref 0–200)
HDL: 40.7 mg/dL (ref >39.00)
LDL Cholesterol: 67 mg/dL (ref 0–99)
NonHDL: 104.15
Total CHOL/HDL Ratio: 4
Triglycerides: 186 mg/dL — ABNORMAL HIGH (ref 0.0–149.0)
VLDL: 37.2 mg/dL (ref 0.0–40.0)

## 2023-09-15 LAB — VITAMIN B12: Vitamin B-12: 383 pg/mL (ref 211–911)

## 2023-09-15 LAB — SEDIMENTATION RATE: Sed Rate: 23 mm/h (ref 0–30)

## 2023-09-15 LAB — VITAMIN D 25 HYDROXY (VIT D DEFICIENCY, FRACTURES): VITD: 25.97 ng/mL — ABNORMAL LOW (ref 30.00–100.00)

## 2023-09-15 LAB — PHOSPHORUS: Phosphorus: 3.5 mg/dL (ref 2.3–4.6)

## 2023-09-16 ENCOUNTER — Ambulatory Visit: Payer: Self-pay | Admitting: Family Medicine

## 2023-09-16 LAB — PARATHYROID HORMONE, INTACT (NO CA): PTH: 99 pg/mL — ABNORMAL HIGH (ref 16–77)

## 2023-09-18 ENCOUNTER — Encounter: Payer: Self-pay | Admitting: Pharmacist

## 2023-09-18 NOTE — Progress Notes (Signed)
 Pharmacy Quality Measure Review  This patient is appearing on a report for being at risk of failing the adherence measure for diabetes medications this calendar year.   Medication: JANUVIA  25 MG Last fill date: 07/08/23 for 30 day supply  Contacted pharmacy to facilitate refills.

## 2023-09-22 ENCOUNTER — Ambulatory Visit: Payer: PPO | Admitting: Family Medicine

## 2023-09-22 ENCOUNTER — Other Ambulatory Visit: Payer: Self-pay

## 2023-09-22 ENCOUNTER — Encounter: Payer: Self-pay | Admitting: Family Medicine

## 2023-09-22 VITALS — BP 130/66 | HR 83 | Temp 97.9°F | Ht 62.25 in | Wt 132.0 lb

## 2023-09-22 DIAGNOSIS — Z7189 Other specified counseling: Secondary | ICD-10-CM | POA: Diagnosis not present

## 2023-09-22 DIAGNOSIS — I10 Essential (primary) hypertension: Secondary | ICD-10-CM | POA: Diagnosis not present

## 2023-09-22 DIAGNOSIS — E538 Deficiency of other specified B group vitamins: Secondary | ICD-10-CM | POA: Diagnosis not present

## 2023-09-22 DIAGNOSIS — Z66 Do not resuscitate: Secondary | ICD-10-CM

## 2023-09-22 DIAGNOSIS — E559 Vitamin D deficiency, unspecified: Secondary | ICD-10-CM | POA: Diagnosis not present

## 2023-09-22 DIAGNOSIS — Z7984 Long term (current) use of oral hypoglycemic drugs: Secondary | ICD-10-CM

## 2023-09-22 DIAGNOSIS — M154 Erosive (osteo)arthritis: Secondary | ICD-10-CM

## 2023-09-22 DIAGNOSIS — I5022 Chronic systolic (congestive) heart failure: Secondary | ICD-10-CM

## 2023-09-22 DIAGNOSIS — K52839 Microscopic colitis, unspecified: Secondary | ICD-10-CM

## 2023-09-22 DIAGNOSIS — E785 Hyperlipidemia, unspecified: Secondary | ICD-10-CM

## 2023-09-22 DIAGNOSIS — E039 Hypothyroidism, unspecified: Secondary | ICD-10-CM | POA: Diagnosis not present

## 2023-09-22 DIAGNOSIS — F332 Major depressive disorder, recurrent severe without psychotic features: Secondary | ICD-10-CM

## 2023-09-22 DIAGNOSIS — Z0000 Encounter for general adult medical examination without abnormal findings: Secondary | ICD-10-CM

## 2023-09-22 DIAGNOSIS — E1169 Type 2 diabetes mellitus with other specified complication: Secondary | ICD-10-CM | POA: Diagnosis not present

## 2023-09-22 DIAGNOSIS — N183 Chronic kidney disease, stage 3 unspecified: Secondary | ICD-10-CM | POA: Diagnosis not present

## 2023-09-22 DIAGNOSIS — M3313 Other dermatomyositis without myopathy: Secondary | ICD-10-CM

## 2023-09-22 DIAGNOSIS — E1122 Type 2 diabetes mellitus with diabetic chronic kidney disease: Secondary | ICD-10-CM

## 2023-09-22 DIAGNOSIS — N2581 Secondary hyperparathyroidism of renal origin: Secondary | ICD-10-CM

## 2023-09-22 DIAGNOSIS — E739 Lactose intolerance, unspecified: Secondary | ICD-10-CM

## 2023-09-22 DIAGNOSIS — M810 Age-related osteoporosis without current pathological fracture: Secondary | ICD-10-CM

## 2023-09-22 MED ORDER — LEVOTHYROXINE SODIUM 50 MCG PO TABS
50.0000 ug | ORAL_TABLET | Freq: Every day | ORAL | 4 refills | Status: AC
Start: 2023-09-22 — End: ?

## 2023-09-22 MED ORDER — SITAGLIPTIN PHOSPHATE 25 MG PO TABS: 25.0000 mg | ORAL_TABLET | Freq: Every day | ORAL | 4 refills | Status: AC

## 2023-09-22 MED ORDER — MIRTAZAPINE 15 MG PO TABS: 15.0000 mg | ORAL_TABLET | Freq: Every day | ORAL | 4 refills | Status: AC

## 2023-09-22 MED ORDER — AMLODIPINE BESYLATE 2.5 MG PO TABS: 2.5000 mg | ORAL_TABLET | Freq: Every morning | ORAL | 3 refills | Status: AC

## 2023-09-22 MED ORDER — VENLAFAXINE HCL ER 75 MG PO CP24: 225.0000 mg | ORAL_CAPSULE | Freq: Every morning | ORAL | 3 refills | Status: AC

## 2023-09-22 NOTE — Assessment & Plan Note (Signed)
 Chronic, stable. Continue current regimen.

## 2023-09-22 NOTE — Progress Notes (Unsigned)
 Ph: [phone] Fax: [phone]   Patient ID: Kathleen Williamson, female    DOB: [birth year], 83 y.o.   MRN: [id number]  This visit was conducted in person.  BP 130/66   Pulse 83   Temp 97.9 F (36.6 C) (Oral)   Ht 5' 2.25" (1.581 m)   Wt 132 lb (59.9 kg)   LMP  (LMP Unknown)   SpO2 98%   BMI 23.95 kg/m    CC: CPE Subjective:   HPI: Kathleen Williamson is a 83 y.o. female presenting on 09/22/2023 for Annual Exam (MCR prt 2 [AWV- 09/08/23].)   Saw health advisor last week for medicare wellness visit. Note reviewed.   No results found.  Flowsheet Row Clinical Support from 09/08/2023 in [facility] [place]  PHQ-2 Total Score 3          09/08/2023   11:16 AM 06/11/2023   10:47 AM 05/13/2023    3:10 PM 12/22/2022   11:39 AM 09/05/2022   10:17 AM  Fall Risk   Falls in the past year? 0 0 1 1 0  Number falls in past yr: 0  0  0  Injury with Fall? 0  0 1 0  Risk for fall due to : No Fall Risks  History of fall(s)  No Fall Risks  Follow up Falls evaluation completed  Falls evaluation completed  Falls evaluation completed     Seeing cardiology for CAD s/p 5v CABG (2003), carotid disease, LBBB and cardiomyopathy with sCHF and reduced EF to 30-35%. most recently started on imdur  due to ongoing chest pain with improvement - this has allowed her to be active than previously.   Longstanding MDD on effexor  XR daily with remeron  15mg  nightly. Off klonopin . She last saw counselor Brian Campanile at [facility] - declines return. Has not seen psychiatry.    She previously stopped farxiga  due to concern over recurrent yeast infection.    Bilateral chronic hand pain - xrays showed erosive osteoarthritis. Has seen Dr Celestino Cole ortho in [place].  Has personal h/o dermatomyositis. Denies heliotrope rash, myalgia, proximal muscle weakness, dysphagia or dyspnea.  Fmhx RA (mother)  Previously cancelled rheumatology evaluation given stability in symptoms.   DM - off januvia   25mg  for the past 2 months as pharmacy has had trouble filling this medication.  Lab Results  Component Value Date   HGBA1C 7.3 (H) 09/15/2023    Preventative: Colonoscopy 05/23/2014 microscopic colitis attributed to zoloft  (Dr Lincoln Renshaw at [facility]). Aged out. Denies blood in stool or bowel changes  Breast cancer screening - 06/2019 [facility]) - self breast exams at home without concerns. Declines repeat.  Well woman with pap 03/2014 ([facility]) - will not return. S/p complete hysterectomy with B oophorectomy for endometriosis.  Lung cancer screening - never smoker  Osteoporosis:  DEXA scan 01/2015 -2.9 hip, -2.2 spine. She does take calcium  and vitamin D  regularly.  Dexa 2019 - T score -2.6 (improvement). declines bisphosphonate. Prolia  started 08/2017.  DEXA 06/2019: T score -2.7 L hip, -2.1 spine.  Last prolia  shot 06/23/2023.  Flu shot yearly  COVID vaccine Moderna 04/2019, 05/2019, booster 02/2020, 08/2020, Pfizer bivalent 12/2020 Td - 2008, 21018, Tdap 2019, 08/2019 Pneumovax 2008, prevnar-13 2017 zostavax - 2011 shingrix - 02/2017, 04/2017 Advanced directive discussion - has completed through attorney. Asked to bring us  a copy. HCPOA would be SIL Ninette Basque or friend Tanya Fantasia. Desires to be DNR.  Seat belt use discussed  Sunscreen use discussed, no changing moles  on skin.  Non smoker Alcohol - none Dentist q6 mo  Eye exam yearly  Bowel - no constipation  Bladder - mild urge incontinence, manageable   L[facility] at [facility]. Husband of 47 yrs died suddenly 09/28/2013.  No children, no siblings.   Activity: no regular exercise  Diet: poor - lots of frozen dinners, eating out      Relevant past medical, surgical, family and social history reviewed and updated as indicated. Interim medical history since our last visit reviewed. Allergies and medications reviewed and updated. Outpatient Medications Prior to Visit  Medication Sig Dispense Refill   Accu-Chek FastClix Lancets MISC USE AS INSTRUCTED TO  CHECK BLOOD SUGAR ONCE DAILY 102 each 1   acetaminophen  (TYLENOL ) 325 MG tablet Take 650 mg by mouth every 6 (six) hours as needed for moderate pain or headache.     aspirin  EC 81 MG tablet Take 81 mg by mouth daily. Swallow whole.     atorvastatin  (LIPITOR) 40 MG tablet Take 1 tablet (40 mg total) by mouth at bedtime. 90 tablet 3   Blood Glucose Monitoring Suppl (ACCU-CHEK GUIDE) w/Device KIT 1 each by Does not apply route as directed. Use as instructed to check blood sugar once daily 1 kit 0   Cholecalciferol (VITAMIN D3) 25 MCG (1000 UT) CAPS Take 2 capsules (2,000 Units total) by mouth daily. 30 capsule    Coenzyme Q10 (CO Q-10) 100 MG CAPS Take 100 mg by mouth at bedtime. 30 capsule 8   cyanocobalamin  (VITAMIN B12) 1000 MCG tablet Take 1 tablet (1,000 mcg total) by mouth once a week.     denosumab  (PROLIA ) 60 MG/ML SOSY injection Inject 60 mg into the skin every 6 (six) months.     ezetimibe  (ZETIA ) 10 MG tablet TAKE 1 TABLET BY MOUTH DAILY 90 tablet 3   glucose blood (ONETOUCH VERIO) test strip Use as instructed to check blood sugar once a day 100 each 3   isosorbide  mononitrate (IMDUR ) 30 MG 24 hr tablet Take 1/2 tab (15 mg) once daily for 2 weeks, then increase to 1 tab (30 mg) once daily 30 tablet 11   lisinopril  (ZESTRIL ) 10 MG tablet Take 1 tablet (10 mg total) by mouth daily. 30 tablet 7   loperamide (IMODIUM A-D) 2 MG tablet Take 2 mg by mouth 4 (four) times daily as needed for diarrhea or loose stools.     nitroGLYCERIN  (NITROLINGUAL ) 0.4 MG/SPRAY spray PLACE 1 SPRAY UNDER THE TONGUE EVERY 5 (FIVE) MINUTES AS NEEDED. 4.9 g 0   amLODipine  (NORVASC ) 2.5 MG tablet Take 1 tablet (2.5 mg total) by mouth every morning. 90 tablet 3   levothyroxine  (SYNTHROID ) 50 MCG tablet Take 1 tablet (50 mcg total) by mouth daily. 90 tablet 3   mirtazapine  (REMERON ) 15 MG tablet Take 1 tablet (15 mg total) by mouth at bedtime. 90 tablet 3   venlafaxine  XR (EFFEXOR -XR) 75 MG 24 hr capsule Take 3 capsules  (225 mg total) by mouth every morning. 270 capsule 3   sitaGLIPtin  (JANUVIA ) 25 MG tablet Take 1 tablet (25 mg total) by mouth daily. (Patient not taking: Reported on 09/22/2023) 30 tablet 9   Facility-Administered Medications Prior to Visit  Medication Dose Route Frequency Provider Last Rate Last Admin   denosumab  (PROLIA ) injection 60 mg  60 mg Subcutaneous Once Adriane Guglielmo, MD       [START ON 12/24/2023] denosumab  (PROLIA ) injection 60 mg  60 mg Subcutaneous Once Korine Winton, MD  Per HPI unless specifically indicated in ROS section below Review of Systems  Constitutional:  Negative for activity change, appetite change, chills, fatigue, fever and unexpected weight change.  HENT:  Negative for hearing loss.   Eyes:  Negative for visual disturbance.  Respiratory:  Negative for cough, chest tightness, shortness of breath and wheezing.   Cardiovascular:  Negative for chest pain, palpitations and leg swelling.  Gastrointestinal:  Positive for diarrhea (lactose intolerance). Negative for abdominal distention, abdominal pain, blood in stool, constipation, nausea and vomiting.  Genitourinary:  Negative for difficulty urinating and hematuria.  Musculoskeletal:  Negative for arthralgias, myalgias and neck pain.  Skin:  Negative for rash.  Neurological:  Negative for dizziness, seizures, syncope and headaches.  Hematological:  Negative for adenopathy. Does not bruise/bleed easily.  Psychiatric/Behavioral:  Negative for dysphoric mood. The patient is not nervous/anxious.     Objective:  BP 130/66   Pulse 83   Temp 97.9 F (36.6 C) (Oral)   Ht 5' 2.25" (1.581 m)   Wt 132 lb (59.9 kg)   LMP  (LMP Unknown)   SpO2 98%   BMI 23.95 kg/m   Wt Readings from Last 3 Encounters:  09/22/23 132 lb (59.9 kg)  09/08/23 131 lb (59.4 kg)  08/26/23 131 lb 3.2 oz (59.5 kg)      Physical Exam Vitals and nursing note reviewed.  Constitutional:      Appearance: Normal appearance. She is  not ill-appearing.  HENT:     Head: Normocephalic and atraumatic.     Right Ear: Tympanic membrane, ear canal and external ear normal. There is no impacted cerumen.     Left Ear: Tympanic membrane, ear canal and external ear normal. There is no impacted cerumen.     Mouth/Throat:     Mouth: Mucous membranes are moist.     Pharynx: Oropharynx is clear. No posterior oropharyngeal erythema.  Eyes:     General:        Right eye: No discharge.        Left eye: No discharge.     Extraocular Movements: Extraocular movements intact.     Conjunctiva/sclera: Conjunctivae normal.     Pupils: Pupils are equal, round, and reactive to light.  Neck:     Thyroid : No thyroid  mass or thyromegaly.     Vascular: No carotid bruit.  Cardiovascular:     Rate and Rhythm: Normal rate and regular rhythm.     Pulses: Normal pulses.     Heart sounds: Normal heart sounds. No murmur heard. Pulmonary:     Effort: Pulmonary effort is normal. No respiratory distress.     Breath sounds: Normal breath sounds. No wheezing, rhonchi or rales.  Abdominal:     General: Bowel sounds are normal. There is no distension.     Palpations: Abdomen is soft. There is no mass.     Tenderness: There is no abdominal tenderness. There is no guarding or rebound.     Hernia: No hernia is present.  Musculoskeletal:     Cervical back: Normal range of motion and neck supple. No rigidity.     Right lower leg: No edema.     Left lower leg: No edema.  Lymphadenopathy:     Cervical: No cervical adenopathy.  Skin:    General: Skin is warm and dry.     Findings: No rash.  Neurological:     General: No focal deficit present.     Mental Status: She is alert. Mental status is  at baseline.  Psychiatric:        Mood and Affect: Mood normal.        Behavior: Behavior normal.       Results for orders placed or performed in visit on 09/15/23  Sedimentation rate   Collection Time: 09/15/23  9:08 AM  Result Value Ref Range   Sed Rate 23  0 - 30 mm/hr  CK   Collection Time: 09/15/23  9:08 AM  Result Value Ref Range   Total CK 38 7 - 177 U/L  TSH   Collection Time: 09/15/23  9:08 AM  Result Value Ref Range   TSH 0.86 0.35 - 5.50 uIU/mL  Vitamin B12   Collection Time: 09/15/23  9:08 AM  Result Value Ref Range   Vitamin B-12 383 211 - 911 pg/mL  CBC with Differential/Platelet   Collection Time: 09/15/23  9:08 AM  Result Value Ref Range   WBC 7.5 4.0 - 10.5 K/uL   RBC 4.10 3.87 - 5.11 Mil/uL   Hemoglobin 11.7 (L) 12.0 - 15.0 g/dL   HCT 47.8 (L) 29.5 - 62.1 %   MCV 85.3 78.0 - 100.0 fl   MCHC 33.4 30.0 - 36.0 g/dL   RDW 30.8 65.7 - 84.6 %   Platelets 283.0 150.0 - 400.0 K/uL   Neutrophils Relative % 58.0 43.0 - 77.0 %   Lymphocytes Relative 30.2 12.0 - 46.0 %   Monocytes Relative 7.6 3.0 - 12.0 %   Eosinophils Relative 3.4 0.0 - 5.0 %   Basophils Relative 0.8 0.0 - 3.0 %   Neutro Abs 4.3 1.4 - 7.7 K/uL   Lymphs Abs 2.3 0.7 - 4.0 K/uL   Monocytes Absolute 0.6 0.1 - 1.0 K/uL   Eosinophils Absolute 0.3 0.0 - 0.7 K/uL   Basophils Absolute 0.1 0.0 - 0.1 K/uL  Parathyroid  hormone, intact (no Ca)   Collection Time: 09/15/23  9:08 AM  Result Value Ref Range   PTH 99 (H) 16 - 77 pg/mL  VITAMIN D  25 Hydroxy (Vit-D Deficiency, Fractures)   Collection Time: 09/15/23  9:08 AM  Result Value Ref Range   VITD 25.97 (L) 30.00 - 100.00 ng/mL  Phosphorus   Collection Time: 09/15/23  9:08 AM  Result Value Ref Range   Phosphorus 3.5 2.3 - 4.6 mg/dL  Comprehensive metabolic panel with GFR   Collection Time: 09/15/23  9:08 AM  Result Value Ref Range   Sodium 136 135 - 145 mEq/L   Potassium 4.9 3.5 - 5.1 mEq/L   Chloride 104 96 - 112 mEq/L   CO2 23 19 - 32 mEq/L   Glucose, Bld 170 (H) 70 - 99 mg/dL   BUN 22 6 - 23 mg/dL   Creatinine, Ser 9.62 (H) 0.40 - 1.20 mg/dL   Total Bilirubin 0.5 0.2 - 1.2 mg/dL   Alkaline Phosphatase 47 39 - 117 U/L   AST 13 0 - 37 U/L   ALT 16 0 - 35 U/L   Total Protein 6.6 6.0 - 8.3 g/dL    Albumin 4.2 3.5 - 5.2 g/dL   GFR 95.28 (L) >41.32 mL/min   Calcium  9.2 8.4 - 10.5 mg/dL  Lipid panel   Collection Time: 09/15/23  9:08 AM  Result Value Ref Range   Cholesterol 145 0 - 200 mg/dL   Triglycerides 440.1 (H) 0.0 - 149.0 mg/dL   HDL 02.72 >53.66 mg/dL   VLDL 44.0 0.0 - 34.7 mg/dL   LDL Cholesterol 67 0 - 99 mg/dL   Total  CHOL/HDL Ratio 4    NonHDL 104.15   Hemoglobin A1c   Collection Time: 09/15/23  9:08 AM  Result Value Ref Range   Hgb A1c MFr Bld 7.3 (H) 4.6 - 6.5 %    Assessment & Plan:   Problem List Items Addressed This Visit     MDD (major depressive disorder), recurrent severe, without psychosis (HCC)   Relevant Medications   mirtazapine  (REMERON ) 15 MG tablet   venlafaxine  XR (EFFEXOR -XR) 75 MG 24 hr capsule   Hypothyroidism   Relevant Medications   levothyroxine  (SYNTHROID ) 50 MCG tablet     Meds ordered this encounter  Medications   levothyroxine  (SYNTHROID ) 50 MCG tablet    Sig: Take 1 tablet (50 mcg total) by mouth daily.    Dispense:  90 tablet    Refill:  4   mirtazapine  (REMERON ) 15 MG tablet    Sig: Take 1 tablet (15 mg total) by mouth at bedtime.    Dispense:  90 tablet    Refill:  4   amLODipine  (NORVASC ) 2.5 MG tablet    Sig: Take 1 tablet (2.5 mg total) by mouth every morning.    Dispense:  90 tablet    Refill:  3   venlafaxine  XR (EFFEXOR -XR) 75 MG 24 hr capsule    Sig: Take 3 capsules (225 mg total) by mouth every morning.    Dispense:  270 capsule    Refill:  3   sitaGLIPtin  (JANUVIA ) 25 MG tablet    Sig: Take 1 tablet (25 mg total) by mouth daily.    Dispense:  90 tablet    Refill:  4    No orders of the defined types were placed in this encounter.   Patient Instructions  Restart vitamin D  2000 units daily Restart vitamin B12 1000mcg once weekly  Could try lactase enzyme if you eat lactose containing food. Otherwise try to limit lactose in diet.  If you change your mind, call [facility] to schedule mammogram.  I will order  updated bone density scan through [facility]. Call to schedule bone density scan at your convenience: [facility] in [place] [phone] I've sent januvia  25mg  3 month supply to mail order pharmacy  Good to see you today  Return in 6 months for diabetes and kidney follow up visit   Follow up plan: Return in about 6 months (around 03/23/2024), or if symptoms worsen or fail to improve, for follow up visit.  Claire Crick, MD

## 2023-09-22 NOTE — Assessment & Plan Note (Addendum)
 Advanced directive discussion - has completed through attorney. Asked to bring us  a copy. HCPOA would be SIL Ninette Basque or friend Tanya Fantasia. Desires to be DNR.

## 2023-09-22 NOTE — Assessment & Plan Note (Signed)
Previously confirmed.

## 2023-09-22 NOTE — Patient Instructions (Addendum)
 Restart vitamin D  2000 units daily Restart vitamin B12 1000mcg once weekly  Could try lactase enzyme if you eat lactose containing food. Otherwise try to limit lactose in diet.  If you change your mind, call [facility] to schedule mammogram.  I will order updated bone density scan through [facility]. Call to schedule bone density scan at your convenience: [facility] in [place] [phone] I've sent januvia  25mg  3 month supply to mail order pharmacy  Good to see you today  Return in 6 months for diabetes and kidney follow up visit

## 2023-09-22 NOTE — Assessment & Plan Note (Signed)
 Continues prolia  q6 months. Update DEXA through [facility].

## 2023-09-22 NOTE — Assessment & Plan Note (Signed)
 Preventative protocols reviewed and updated unless pt declined. Discussed healthy diet and lifestyle.

## 2023-09-22 NOTE — Assessment & Plan Note (Signed)
 Chronic, stable period on current regimen - continue full dose effexor  and mirtazapine .

## 2023-09-22 NOTE — Addendum Note (Signed)
 Addended by: Bernadene Brewer on: 09/22/2023 03:43 PM   Modules accepted: Orders

## 2023-09-23 ENCOUNTER — Encounter: Payer: Self-pay | Admitting: Family Medicine

## 2023-09-23 ENCOUNTER — Ambulatory Visit: Payer: Self-pay | Admitting: Family Medicine

## 2023-09-23 DIAGNOSIS — E739 Lactose intolerance, unspecified: Secondary | ICD-10-CM | POA: Insufficient documentation

## 2023-09-23 DIAGNOSIS — N2581 Secondary hyperparathyroidism of renal origin: Secondary | ICD-10-CM | POA: Insufficient documentation

## 2023-09-23 DIAGNOSIS — E559 Vitamin D deficiency, unspecified: Secondary | ICD-10-CM | POA: Insufficient documentation

## 2023-09-23 LAB — MICROALBUMIN / CREATININE URINE RATIO
Creatinine, Urine: 53 mg/dL (ref 20–275)
Microalb Creat Ratio: 9 mg/g{creat} (ref <30)
Microalb, Ur: 0.5 mg/dL

## 2023-09-23 NOTE — Assessment & Plan Note (Addendum)
 H/o this, related to sertraline  use, saw [facility]. Stable period off SSRI.  Notes lactose intolerance with diarrhea

## 2023-09-23 NOTE — Assessment & Plan Note (Signed)
 Chronic, stable on zetia  + atorvastatin  - continue The ASCVD Risk score (Arnett DK, et al., 2019) failed to calculate for the following reasons:   The 2019 ASCVD risk score is only valid for ages 31 to 20   Risk score cannot be calculated because patient has a medical history suggesting prior/existing ASCVD

## 2023-09-23 NOTE — Assessment & Plan Note (Signed)
 Chronic, stable on current regimen - continue.

## 2023-09-23 NOTE — Assessment & Plan Note (Signed)
 GFR trending down to 28. Will continue to monitor.  SGLT2i declined.

## 2023-09-23 NOTE — Assessment & Plan Note (Signed)
 B12 levels dropping - rec restart once weekly vit B12 1000mcg

## 2023-09-23 NOTE — Assessment & Plan Note (Signed)
Appreciate cardiology care.  °

## 2023-09-23 NOTE — Assessment & Plan Note (Signed)
 Stable period, CPK levels normal.

## 2023-09-23 NOTE — Assessment & Plan Note (Signed)
 Rec restart 2000 international units  daily

## 2023-09-23 NOTE — Assessment & Plan Note (Addendum)
 Discussed limiting lactose in diet, consideration for lactase enzyme use PRN

## 2023-09-23 NOTE — Assessment & Plan Note (Signed)
 Chronic, A1c trending up. She has had trouble filling januvia  through local pharmacy - will end 90d supply to mailorder.

## 2023-09-23 NOTE — Assessment & Plan Note (Addendum)
 PTH 99. Continue to monitor. Restart vit D 2000 units daily.

## 2023-10-30 ENCOUNTER — Telehealth: Payer: Self-pay | Admitting: Family Medicine

## 2023-10-30 NOTE — Telephone Encounter (Signed)
Placed forms in Dr. G's box.  

## 2023-10-30 NOTE — Telephone Encounter (Signed)
 Patient dropped off transportation ppwk for provider to complete place in box at front desk

## 2023-11-04 ENCOUNTER — Telehealth: Payer: Self-pay | Admitting: Family Medicine

## 2023-11-04 NOTE — Telephone Encounter (Addendum)
 Filled out and in my CMA box.  She needs to fill out page 1.

## 2023-11-04 NOTE — Telephone Encounter (Signed)
 Copied from CRM [id number]. Topic: General - Other >> Nov 04, 2023  4:13 PM Tiffany S wrote: Reason for CRM: Patient is following up on paperwork she stated she needs it by Friday so she can have it faxed back to the [facility] please follow up with patient

## 2023-11-04 NOTE — Telephone Encounter (Signed)
Replied via other phone note.

## 2023-11-05 ENCOUNTER — Telehealth: Payer: Self-pay | Admitting: Family Medicine

## 2023-11-05 NOTE — Telephone Encounter (Signed)
 Copied from CRM [id number]. Topic: General - Other >> Nov 04, 2023  4:13 PM Tiffany S wrote: Reason for CRM: Patient is following up on paperwork she stated she needs it by Friday so she can have it faxed back to the [facility] please follow up with patient >> Nov 05, 2023 12:41 PM Donna E wrote: Patient calling back in, who is very frustrated with Dr Rilla, patient states he is sitting on this, not filling it out paper work for [facility]  patient needs this form filled out, patient want to come into the office to pick it. She will pick up the form today before 4:30pm   patient would like a call back regarding this, patient cell  [phone]   Read verbatim Dr Rilla note: Rilla Baller, MD    11/04/23  5:20 PM Note Filled out and in my CMA box.  She needs to fill out page 1.

## 2023-11-06 NOTE — Telephone Encounter (Signed)
 Duplicate see additional encounter for documentation.

## 2023-11-06 NOTE — Telephone Encounter (Signed)
 Patient arrived in office 7.25.25 obtained forms from [facility] inbox for patient to complete.  Copies made sent to scan center original given to patient

## 2023-11-23 ENCOUNTER — Telehealth: Payer: Self-pay

## 2023-11-23 NOTE — Telephone Encounter (Signed)
 Prolia  VOB initiated via [email or web address]  Next Prolia  inj DUE: 12/24/23

## 2023-11-24 ENCOUNTER — Other Ambulatory Visit (HOSPITAL_COMMUNITY): Payer: Self-pay

## 2023-11-24 NOTE — Telephone Encounter (Signed)
 Pt ready for scheduling for PROLIA  on or after : 12/24/23  Option# 1: Buy/Bill (Office supplied medication)  Out-of-pocket cost due at time of clinic visit: $332  Number of injection/visits approved: ---  Primary: [employer] Prolia  co-insurance: 20% Admin fee co-insurance: 0%  Secondary: --- Prolia  co-insurance:  Admin fee co-insurance:   Medical Benefit Details: Date Benefits were checked: 11/23/23 Deductible: NO/ Coinsurance: 20%/ Admin Fee: 0%  Prior Auth: N/A PA# Expiration Date:   # of doses approved: ----------------------------------------------------------------------- Option# 2- Med Obtained from pharmacy:  Pharmacy benefit: Copay $23.78 (Paid to pharmacy) Admin Fee: 0% (Pay at clinic)  Prior Auth: N/A PA# Expiration Date:   # of doses approved:   If patient wants fill through the pharmacy benefit please send prescription to: [facility], and include estimated need by date in rx notes. Pharmacy will ship medication directly to the office.  Patient NOT eligible for Prolia  Copay Card. Copay Card can make patient's cost as little as $25. Link to apply: https://www.amgensupportplus.com/copay  ** This summary of benefits is an estimation of the patient's out-of-pocket cost. Exact cost may very based on individual plan coverage.

## 2023-11-26 ENCOUNTER — Encounter: Payer: Self-pay | Admitting: Pharmacist

## 2023-11-26 NOTE — Progress Notes (Signed)
 Pharmacy Quality Measure Review  This patient is appearing on a report for being at risk of failing the adherence measure for diabetes medications this calendar year.   Medication: Januvia  Last fill date: 6/30 for 30 day supply  Insurance report was not up to date. No action needed at this time.  Medication has been refilled as of 11/23/23 x30 day supply.  Patient has 3 additional refills remaining.

## 2023-11-27 ENCOUNTER — Other Ambulatory Visit: Payer: Self-pay

## 2023-12-01 ENCOUNTER — Other Ambulatory Visit: Payer: Self-pay | Admitting: Cardiology

## 2023-12-03 ENCOUNTER — Telehealth: Payer: Self-pay | Admitting: Cardiology

## 2023-12-03 MED ORDER — ATORVASTATIN CALCIUM 40 MG PO TABS: 40.0000 mg | ORAL_TABLET | Freq: Every evening | ORAL | 2 refills | Status: AC

## 2023-12-03 MED ORDER — LISINOPRIL 10 MG PO TABS: 10.0000 mg | ORAL_TABLET | Freq: Every day | ORAL | 6 refills | Status: AC

## 2023-12-03 NOTE — Telephone Encounter (Signed)
 RX sent in

## 2023-12-03 NOTE — Telephone Encounter (Signed)
*  STAT* If patient is at the pharmacy, call can be transferred to refill team.   1. Which medications need to be refilled? (please list name of each medication and dose if known)   atorvastatin  (LIPITOR) 40 MG tablet    lisinopril  (ZESTRIL ) 10 MG tablet    2. Would you like to learn more about the convenience, safety, & potential cost savings by using the [facility]? No     3. Are you open to using the [facility] (Type [facility]. ). No   4. Which pharmacy/location (including street and city if local pharmacy) is medication to be sent to? [facility] [place]  - [place], [place] - [phone]     5. Do they need a 30 day or 90 day supply? 30 day for lisinopril ; 90 day for atorvastatin

## 2023-12-07 ENCOUNTER — Telehealth: Payer: Self-pay | Admitting: Cardiology

## 2023-12-07 NOTE — Telephone Encounter (Signed)
 Samples

## 2023-12-07 NOTE — Telephone Encounter (Signed)
 Patient calling the office for samples of medication:   1.  What medication and dosage are you requesting samples for? atorvastatin  (LIPITOR) 40 MG tablet  lisinopril  (ZESTRIL ) 10 MG tablet  2.  Are you currently out of this medication? Yes

## 2023-12-15 ENCOUNTER — Other Ambulatory Visit: Payer: Self-pay

## 2023-12-17 ENCOUNTER — Telehealth: Payer: Self-pay | Admitting: Family Medicine

## 2023-12-17 ENCOUNTER — Other Ambulatory Visit: Payer: Self-pay | Admitting: Family Medicine

## 2023-12-17 ENCOUNTER — Other Ambulatory Visit: Payer: Self-pay

## 2023-12-17 ENCOUNTER — Other Ambulatory Visit: Payer: Self-pay | Admitting: *Deleted

## 2023-12-17 DIAGNOSIS — M810 Age-related osteoporosis without current pathological fracture: Secondary | ICD-10-CM

## 2023-12-17 MED ORDER — DENOSUMAB 60 MG/ML ~~LOC~~ SOSY
60.0000 mg | PREFILLED_SYRINGE | Freq: Once | SUBCUTANEOUS | 0 refills | Status: AC
  Filled 2023-12-17: qty 1, 180d supply, fill #0
  Filled 2023-12-17: qty 1, 1d supply, fill #0

## 2023-12-17 NOTE — Telephone Encounter (Signed)
 Copied from CRM [id number]. Topic: General - Call Back - No Documentation >> Dec 16, 2023  3:40 PM Burnard DEL wrote: Reason for CRM: Patient called in stating that Morna called her.She wasn't sure what she called her in regards to. Not sure if it was the CMA or Pharmacist. There was no documentation as to who called her.

## 2023-12-17 NOTE — Telephone Encounter (Signed)
 Called patient reviewed all following information including appointment, Co pay due at time of visit and if pick up of injection is needed from outside pharmacy.     Out of pocket for patient:  $23.78  - pharm benefit  Lab appointment :  12/21/23  Nurse visit: 12/24/23  Lab order placed: Yes  Prolia  has been  []   Ordered  []   Office supply medication will be used  []   Script sent to local pharmacy for patient to bring   []   Script sent to Specialty pharmacy   [x]   Script sent to [employer] to deliver

## 2023-12-17 NOTE — Progress Notes (Signed)
 Specialty Pharmacy Refill Coordination Note  Kathleen Williamson is a 83 y.o. female contacted today regarding refills of specialty medication(s) Denosumab  (PROLIA )   Patient requested Courier to Provider Office   Delivery date: 12/21/23   Verified address: [facility]- [address] [place]   Medication will be filled on 09.05.25.    Appointment 9.11.25 Copay: $0.00 Left voicemail to update patient new rx was received and there is no cost for medication.

## 2023-12-21 ENCOUNTER — Other Ambulatory Visit (INDEPENDENT_AMBULATORY_CARE_PROVIDER_SITE_OTHER)

## 2023-12-21 DIAGNOSIS — M810 Age-related osteoporosis without current pathological fracture: Secondary | ICD-10-CM | POA: Diagnosis not present

## 2023-12-21 LAB — BASIC METABOLIC PANEL WITH GFR
BUN: 22 mg/dL (ref 6–23)
CO2: 25 meq/L (ref 19–32)
Calcium: 9.1 mg/dL (ref 8.4–10.5)
Chloride: 104 meq/L (ref 96–112)
Creatinine, Ser: 1.7 mg/dL — ABNORMAL HIGH (ref 0.40–1.20)
GFR: 27.85 mL/min — ABNORMAL LOW (ref >60.00)
Glucose, Bld: 166 mg/dL — ABNORMAL HIGH (ref 70–99)
Potassium: 4.7 meq/L (ref 3.5–5.1)
Sodium: 136 meq/L (ref 135–145)

## 2023-12-23 ENCOUNTER — Ambulatory Visit: Payer: Self-pay | Admitting: Family Medicine

## 2023-12-23 DIAGNOSIS — M810 Age-related osteoporosis without current pathological fracture: Secondary | ICD-10-CM

## 2023-12-23 DIAGNOSIS — E1122 Type 2 diabetes mellitus with diabetic chronic kidney disease: Secondary | ICD-10-CM

## 2023-12-24 ENCOUNTER — Ambulatory Visit (INDEPENDENT_AMBULATORY_CARE_PROVIDER_SITE_OTHER)

## 2023-12-24 DIAGNOSIS — M810 Age-related osteoporosis without current pathological fracture: Secondary | ICD-10-CM | POA: Diagnosis not present

## 2023-12-24 MED ORDER — DENOSUMAB 60 MG/ML ~~LOC~~ SOSY
60.0000 mg | PREFILLED_SYRINGE | Freq: Once | SUBCUTANEOUS | Status: AC
  Administered 2023-12-24: 60 mg via SUBCUTANEOUS

## 2023-12-24 MED ORDER — DENOSUMAB 60 MG/ML ~~LOC~~ SOSY: 60.0000 mg | PREFILLED_SYRINGE | Freq: Once | SUBCUTANEOUS | Status: AC

## 2023-12-24 NOTE — Progress Notes (Addendum)
 Per orders of Dr. Anton Blas, who is out of office and Dr Arlyss Solian who is in the office injection of prolia  60 mg/ml given by Laray Arenas in right arm.Patient tolerated injection well. Patient will make appointment for 6 month.pt also making appt to repeat labs in 1 wk per Dr KANDICE.

## 2023-12-25 ENCOUNTER — Ambulatory Visit: Admitting: Cardiology

## 2023-12-28 ENCOUNTER — Ambulatory Visit: Attending: Cardiology | Admitting: Cardiology

## 2023-12-28 ENCOUNTER — Encounter: Payer: Self-pay | Admitting: Cardiology

## 2023-12-28 VITALS — BP 145/63 | HR 74 | Ht 62.25 in | Wt 133.6 lb

## 2023-12-28 DIAGNOSIS — I5022 Chronic systolic (congestive) heart failure: Secondary | ICD-10-CM | POA: Diagnosis not present

## 2023-12-28 DIAGNOSIS — I10 Essential (primary) hypertension: Secondary | ICD-10-CM | POA: Diagnosis not present

## 2023-12-28 DIAGNOSIS — I25119 Atherosclerotic heart disease of native coronary artery with unspecified angina pectoris: Secondary | ICD-10-CM | POA: Diagnosis not present

## 2023-12-28 DIAGNOSIS — I447 Left bundle-branch block, unspecified: Secondary | ICD-10-CM | POA: Diagnosis not present

## 2023-12-28 NOTE — Patient Instructions (Signed)

## 2023-12-28 NOTE — Progress Notes (Signed)
 Cardiology Office Note:  .   Date:  12/28/2023  ID:  Kathleen Williamson, DOB [birth year], MRN [id number] PCP: Rilla Baller, MD  [facility] Providers Cardiologist:  Oneil Parchment, MD     History of Present Illness: .   Kathleen Williamson is a 83 y.o. female Discussed the use of AI scribe software   History of Present Illness Kathleen Williamson is an 83 year old female with coronary artery disease who presents for follow-up of her cardiac condition.  She has a history of coronary artery disease with coronary artery bypass grafting (CABG) performed in 2003. A recent nuclear stress test showed no ischemia or infarction. Echocardiogram in October 2023 showed an ejection fraction (EF) of 30-35%. She experiences chest pressure, described as a 'heavy pressure,' during hurried activities, which has been a concern for her.  Her current medication regimen includes isosorbide  30 mg daily, amlodipine  2.5 mg daily, atorvastatin  40 mg, Zetia  10 mg, and lisinopril  10 mg. She previously could not tolerate Entresto . Beta-blockers have been avoided due to her left bundle branch block. She mentions that the isosorbide  has been effective, stating it 'has done the job' when she gets up and starts her day.  Her past medical history includes type 2 diabetes, with a recent hemoglobin A1c of 7.3. Her triglycerides have decreased from 236 to 186. She also has a creatinine level of 1.7, indicating some kidney function concerns. She mentions needing to return for a follow-up blood draw for her kidney function.  In terms of family history, her mother passed away at the age of 33 and her father at 68, both with heart-related issues. She reflects on having had her bypass surgery before the age her father passed away.  Socially, she enjoys the outdoors and mentions having access to an indoor track for exercise, although the hot weather has been a deterrent. She recently celebrated her 83nd birthday.      Studies Reviewed:  .        Results LABS Triglycerides: 186 Hemoglobin A1c: 7.3 Hemoglobin: 11.7 Creatinine: 1.7 LDL cholesterol: 67  DIAGNOSTIC Nuclear stress test: No ischemia, no infarction, high risk due to low ejection fraction Echocardiogram: Ejection Fraction (EF) 30-35% (01/2022) Risk Assessment/Calculations:           Physical Exam:   VS:  BP (!) 145/63 (BP Location: Left Arm, Patient Position: Sitting, Cuff Size: Normal)   Pulse 74   Ht 5' 2.25 (1.581 m)   Wt 133 lb 9.6 oz (60.6 kg)   LMP  (LMP Unknown)   SpO2 98%   BMI 24.24 kg/m    Wt Readings from Last 3 Encounters:  12/28/23 133 lb 9.6 oz (60.6 kg)  09/22/23 132 lb (59.9 kg)  09/08/23 131 lb (59.4 kg)    GEN: Well nourished, well developed in no acute distress NECK: No JVD; No carotid bruits CARDIAC: RRR, no murmurs, no rubs, no gallops RESPIRATORY:  Clear to auscultation without rales, wheezing or rhonchi  ABDOMEN: Soft, non-tender, non-distended EXTREMITIES:  No edema; No deformity   ASSESSMENT AND PLAN: .    Assessment and Plan Assessment & Plan Coronary artery disease status post-CABG with chronic heart failure with reduced ejection fraction Coronary artery disease with CABG in 2003. Recent nuclear stress test showed no ischemia or infarction but was high risk due to low ejection fraction. Ejection fraction is 30-35% by echo in October 2023. She experiences chest pressure during hurried activities. Isosorbide  30 mg daily has been effective in managing  symptoms. Beta-blockers avoided due to left bundle branch block. Consideration of cardiac catheterization if symptoms progress. She could not tolerate Entresto , continues on lisinopril  10 mg. Consideration of Jardiance  for further management. Medical management is preferred over invasive procedures at this time due to effective symptom control with current medications. - Continue isosorbide  30 mg daily. - Continue lisinopril  10 mg daily. - Consider cardiac catheterization  if symptoms progress. For now doing well. No indication.  - Consider Jardiance  for further management.  Left bundle branch block Left bundle branch block present, which has led to avoidance of beta-blockers in the management of her coronary artery disease and heart failure.  Chronic kidney disease stage IIIa Chronic kidney disease stage IIIa with creatinine level at 1.7. Kidney function is sluggish. She is scheduled for a follow-up blood draw with Dr. Rilla. - Monitor kidney function with follow-up blood draw.  Hyperlipidemia Hyperlipidemia is well-managed with current LDL cholesterol at 67, which is optimal. She is on atorvastatin  40 mg and Zetia  10 mg. - Continue atorvastatin  40 mg daily. - Continue Zetia  10 mg daily.  Type 2 diabetes mellitus Type 2 diabetes mellitus with hemoglobin A1c at 7.3. Triglycerides have improved from 236 to 186.         Dispo: 1 yr  Signed, Oneil Parchment, MD

## 2023-12-31 ENCOUNTER — Ambulatory Visit: Payer: Self-pay | Admitting: Family Medicine

## 2023-12-31 ENCOUNTER — Other Ambulatory Visit (INDEPENDENT_AMBULATORY_CARE_PROVIDER_SITE_OTHER)

## 2023-12-31 DIAGNOSIS — M810 Age-related osteoporosis without current pathological fracture: Secondary | ICD-10-CM | POA: Diagnosis not present

## 2023-12-31 DIAGNOSIS — E1122 Type 2 diabetes mellitus with diabetic chronic kidney disease: Secondary | ICD-10-CM

## 2023-12-31 DIAGNOSIS — N183 Chronic kidney disease, stage 3 unspecified: Secondary | ICD-10-CM | POA: Diagnosis not present

## 2023-12-31 LAB — BASIC METABOLIC PANEL WITH GFR
BUN: 22 mg/dL (ref 6–23)
CO2: 23 meq/L (ref 19–32)
Calcium: 8.4 mg/dL (ref 8.4–10.5)
Chloride: 104 meq/L (ref 96–112)
Creatinine, Ser: 1.6 mg/dL — ABNORMAL HIGH (ref 0.40–1.20)
GFR: 29.95 mL/min — ABNORMAL LOW (ref >60.00)
Glucose, Bld: 163 mg/dL — ABNORMAL HIGH (ref 70–99)
Potassium: 4 meq/L (ref 3.5–5.1)
Sodium: 133 meq/L — ABNORMAL LOW (ref 135–145)

## 2024-01-11 ENCOUNTER — Encounter: Payer: Self-pay | Admitting: Pharmacist

## 2024-01-11 NOTE — Progress Notes (Signed)
 Pharmacy Quality Measure Review  This patient is appearing on a report for being at risk of failing the adherence measure for diabetes medications this calendar year.   Medication: Januvia  25 mg Last fill date: 11/23/23 for 30 day supply  Insurance report was not up to date. No action needed at this time.  Medication has been refilled as of 12/21/23 x30 ds.  Several refill remaining on current Rx.

## 2024-01-18 DIAGNOSIS — H0011 Chalazion right upper eyelid: Secondary | ICD-10-CM | POA: Diagnosis not present

## 2024-01-27 ENCOUNTER — Other Ambulatory Visit: Payer: Self-pay

## 2024-02-09 ENCOUNTER — Other Ambulatory Visit (HOSPITAL_COMMUNITY): Payer: Self-pay

## 2024-02-26 ENCOUNTER — Encounter: Payer: Self-pay | Admitting: Pharmacist

## 2024-02-26 NOTE — Progress Notes (Signed)
 Pharmacy Quality Measure Review  This patient is appearing on a report for being at risk of failing the Controlling Blood Pressure measure this calendar year.   Last documented BP  BP Readings from Last 1 Encounters:  12/28/23 (!) 145/63   does not meet criteria for measure closure (BP <140/90).  KED: PASS SUPD: PASS, atorvastatin   2025 f/u scheduled: YES Encounter note placed **[facility] pt - Ensure BP resting >68min. Recheck if higher than 139/79**    Future Appointments  Date Time Provider Department Center  03/23/2024 11:00 AM Rilla Baller, MD [facility] [address]  08/29/2024 10:10 AM [facility] ANNUAL WELLNESS VISIT 1 [facility] [address]

## 2024-03-23 ENCOUNTER — Encounter: Payer: Self-pay | Admitting: Family Medicine

## 2024-03-23 ENCOUNTER — Ambulatory Visit: Admitting: Family Medicine

## 2024-03-23 VITALS — BP 120/70 | HR 80 | Temp 98.4°F | Ht 62.25 in | Wt 135.2 lb

## 2024-03-23 DIAGNOSIS — M810 Age-related osteoporosis without current pathological fracture: Secondary | ICD-10-CM | POA: Diagnosis not present

## 2024-03-23 DIAGNOSIS — E1169 Type 2 diabetes mellitus with other specified complication: Secondary | ICD-10-CM | POA: Diagnosis not present

## 2024-03-23 DIAGNOSIS — I10 Essential (primary) hypertension: Secondary | ICD-10-CM | POA: Diagnosis not present

## 2024-03-23 DIAGNOSIS — I5022 Chronic systolic (congestive) heart failure: Secondary | ICD-10-CM

## 2024-03-23 DIAGNOSIS — K529 Noninfective gastroenteritis and colitis, unspecified: Secondary | ICD-10-CM

## 2024-03-23 DIAGNOSIS — F332 Major depressive disorder, recurrent severe without psychotic features: Secondary | ICD-10-CM | POA: Diagnosis not present

## 2024-03-23 DIAGNOSIS — R413 Other amnesia: Secondary | ICD-10-CM | POA: Diagnosis not present

## 2024-03-23 DIAGNOSIS — Z23 Encounter for immunization: Secondary | ICD-10-CM

## 2024-03-23 LAB — POCT GLYCOSYLATED HEMOGLOBIN (HGB A1C): Hemoglobin A1C: 6.8 % — AB (ref 4.0–5.6)

## 2024-03-23 NOTE — Assessment & Plan Note (Signed)
 Chronic, great control today.

## 2024-03-23 NOTE — Assessment & Plan Note (Signed)
 Chronic, very stable period on current regimen of effexor  and mirtazapine .  Declines medication change at this time.

## 2024-03-23 NOTE — Progress Notes (Signed)
 Ph: [phone] Fax: [phone]   Patient ID: Kathleen Williamson, female    DOB: 09-Mar-1941, 83 y.o.   MRN: [id number]  This visit was conducted in person.  BP 120/70 (Cuff Size: Normal)   Pulse 80   Temp 98.4 F (36.9 C) (Oral)   Ht 5' 2.25 (1.581 m)   Wt 135 lb 3.2 oz (61.3 kg)   LMP  (LMP Unknown)   SpO2 99%   BMI 24.53 kg/m    CC: 6 mo f/u visit  Subjective:   HPI: Kathleen Williamson is a 83 y.o. female presenting on 03/23/2024 for Medical Management of Chronic Issues (Would like to discuss options for chronic loose stool)   See prior note for details.  OP - continues prolia , latest injection 12/24/2023.   Longstanding MDD on effexor  XR daily with remeron  15mg  nightly. Off klonopin . Has not seen psychiatry.   Chronic diarrhea partly attributed to lactose intolerance, also notes difficulty to certain foods ie lettuce (salads), fruits. This causes frequent loose to watery stools (6-8 times daily). Notes abd cramping that improves with BM.   H/o remote hiatal hernia previously managed with carafate. She notes she developed food intolerances since carafate use. Notes more difficulty tolerating salads (lettuce), some fruits.   Activia causes loose stools.   She has history of microscopic colitis that improved off sertraline . She previously saw [facility]. She has been taking imodium and pepto bismol intermittently for this.   No dysphagia, odynophagia, early satiety.  No unexpected weight loss, abd pain.   She has seen [facility] dietician who gave her a list of foods to try.   DM - managed with januvia  25mg  daily.  Lab Results  Component Value Date   HGBA1C 6.8 (A) 03/23/2024       Relevant past medical, surgical, family and social history reviewed and updated as indicated. Interim medical history since our last visit reviewed. Allergies and medications reviewed and updated. Outpatient Medications Prior to Visit  Medication Sig Dispense Refill   Accu-Chek FastClix  Lancets MISC USE AS INSTRUCTED TO CHECK BLOOD SUGAR ONCE DAILY 102 each 1   acetaminophen  (TYLENOL ) 325 MG tablet Take 650 mg by mouth every 6 (six) hours as needed for moderate pain or headache.     amLODipine  (NORVASC ) 2.5 MG tablet Take 1 tablet (2.5 mg total) by mouth every morning. 90 tablet 3   aspirin  EC 81 MG tablet Take 81 mg by mouth daily. Swallow whole.     atorvastatin  (LIPITOR) 40 MG tablet Take 1 tablet (40 mg total) by mouth at bedtime. 90 tablet 2   Blood Glucose Monitoring Suppl (ACCU-CHEK GUIDE) w/Device KIT 1 each by Does not apply route as directed. Use as instructed to check blood sugar once daily 1 kit 0   Cholecalciferol (VITAMIN D3) 25 MCG (1000 UT) CAPS Take 2 capsules (2,000 Units total) by mouth daily. 30 capsule    Coenzyme Q10 (CO Q-10) 100 MG CAPS Take 100 mg by mouth at bedtime. 30 capsule 8   cyanocobalamin  (VITAMIN B12) 1000 MCG tablet Take 1 tablet (1,000 mcg total) by mouth once a week.     ezetimibe  (ZETIA ) 10 MG tablet TAKE 1 TABLET BY MOUTH DAILY 90 tablet 3   glucose blood (ONETOUCH VERIO) test strip Use as instructed to check blood sugar once a day 100 each 3   isosorbide  mononitrate (IMDUR ) 30 MG 24 hr tablet Take 1/2 tab (15 mg) once daily for 2 weeks, then increase to  1 tab (30 mg) once daily 30 tablet 11   levothyroxine  (SYNTHROID ) 50 MCG tablet Take 1 tablet (50 mcg total) by mouth daily. 90 tablet 4   lisinopril  (ZESTRIL ) 10 MG tablet Take 1 tablet (10 mg total) by mouth daily. 30 tablet 6   loperamide (IMODIUM A-D) 2 MG tablet Take 2 mg by mouth 4 (four) times daily as needed for diarrhea or loose stools.     mirtazapine  (REMERON ) 15 MG tablet Take 1 tablet (15 mg total) by mouth at bedtime. 90 tablet 4   nitroGLYCERIN  (NITROLINGUAL ) 0.4 MG/SPRAY spray PLACE 1 SPRAY UNDER THE TONGUE EVERY 5 (FIVE) MINUTES AS NEEDED. 4.9 g 0   sitaGLIPtin  (JANUVIA ) 25 MG tablet Take 1 tablet (25 mg total) by mouth daily. 90 tablet 4   venlafaxine  XR (EFFEXOR -XR) 75 MG  24 hr capsule Take 3 capsules (225 mg total) by mouth every morning. 270 capsule 3   Facility-Administered Medications Prior to Visit  Medication Dose Route Frequency Provider Last Rate Last Admin   denosumab  (PROLIA ) injection 60 mg  60 mg Subcutaneous Once Sherley Leser, MD       denosumab  (PROLIA ) injection 60 mg  60 mg Subcutaneous Once Mikel Hardgrove, MD       [START ON 06/22/2024] denosumab  (PROLIA ) injection 60 mg  60 mg Subcutaneous Once Cleatus Arlyss RAMAN, MD         Per HPI unless specifically indicated in ROS section below Review of Systems  Objective:  BP 120/70 (Cuff Size: Normal)   Pulse 80   Temp 98.4 F (36.9 C) (Oral)   Ht 5' 2.25 (1.581 m)   Wt 135 lb 3.2 oz (61.3 kg)   LMP  (LMP Unknown)   SpO2 99%   BMI 24.53 kg/m   Wt Readings from Last 3 Encounters:  03/23/24 135 lb 3.2 oz (61.3 kg)  12/28/23 133 lb 9.6 oz (60.6 kg)  09/22/23 132 lb (59.9 kg)      Physical Exam Vitals and nursing note reviewed.  Constitutional:      Appearance: Normal appearance. She is not ill-appearing.  HENT:     Mouth/Throat:     Mouth: Mucous membranes are moist.     Pharynx: Oropharynx is clear. No oropharyngeal exudate or posterior oropharyngeal erythema.  Eyes:     Extraocular Movements: Extraocular movements intact.     Pupils: Pupils are equal, round, and reactive to light.  Cardiovascular:     Rate and Rhythm: Normal rate and regular rhythm.     Pulses: Normal pulses.     Heart sounds: Normal heart sounds. No murmur heard. Pulmonary:     Effort: Pulmonary effort is normal. No respiratory distress.     Breath sounds: Normal breath sounds. No wheezing, rhonchi or rales.  Abdominal:     General: Bowel sounds are normal. There is no distension.     Palpations: Abdomen is soft. There is no mass.     Tenderness: There is no abdominal tenderness. There is no guarding or rebound. Negative signs include Murphy's sign.     Hernia: No hernia is present.  Musculoskeletal:      Right lower leg: No edema.     Left lower leg: No edema.  Skin:    General: Skin is warm and dry.     Findings: No rash.  Neurological:     Mental Status: She is alert.  Psychiatric:        Mood and Affect: Mood normal.  Behavior: Behavior normal.       Results for orders placed or performed in visit on 03/23/24  HgB A1c   Collection Time: 03/23/24 11:19 AM  Result Value Ref Range   Hemoglobin A1C 6.8 (A) 4.0 - 5.6 %   HbA1c POC (<> result, manual entry)     HbA1c, POC (prediabetic range)     HbA1c, POC (controlled diabetic range)     Lab Results  Component Value Date   TSH 0.86 09/15/2023    Lab Results  Component Value Date   NA 133 (L) 12/31/2023   CL 104 12/31/2023   K 4.0 12/31/2023   CO2 23 12/31/2023   BUN 22 12/31/2023   CREATININE 1.60 (H) 12/31/2023   GFR 29.95 (L) 12/31/2023   CALCIUM  8.4 12/31/2023   PHOS 3.5 09/15/2023   ALBUMIN 4.2 09/15/2023   GLUCOSE 163 (H) 12/31/2023    Lab Results  Component Value Date   WBC 7.5 09/15/2023   HGB 11.7 (L) 09/15/2023   HCT 35.0 (L) 09/15/2023   MCV 85.3 09/15/2023   PLT 283.0 09/15/2023    Assessment & Plan:   Problem List Items Addressed This Visit     Chronic HFrEF (heart failure with reduced ejection fraction) (HCC) (Chronic)   Seems euvolemic today.       MDD (major depressive disorder), recurrent severe, without psychosis (HCC)   Chronic, very stable period on current regimen of effexor  and mirtazapine .  Declines medication change at this time.       Osteoporosis   Continue q6 month Prolia  shots.       Type 2 diabetes mellitus with other specified complication (HCC)   Chronic, great control only on januvia  25mg  daily - continue.  Avoid metformin in CKD. Diabetes complicated by CKD, CHF, CAD, HLD and HTN      Relevant Orders   HgB A1c (Completed)   Chronic diarrhea - Primary   Chronic issue Describes symptoms of IBS-D + lactose intolerance Rec start probiotic culturelle or  phillips colon health - update with effect.  Provided with IBS and Low FODMAP diet handouts.  Reassess at next visit.       Hypertension   Chronic, great control today.       RESOLVED: Memory impairment   No evidence of ongoing impairment. Will resolve problem.      Other Visit Diagnoses       Encounter for immunization       Relevant Orders   Pneumococcal conjugate vaccine 20-valent (Prevnar 20) (Completed)        No orders of the defined types were placed in this encounter.   Orders Placed This Encounter  Procedures   Pneumococcal conjugate vaccine 20-valent (Prevnar 20)   HgB A1c    Patient Instructions  Prevnar-20 final pneumonia shot today.  You can check with pharmacy on RSV shot Increase water  and fiber in the diet.  Exclude gas producing foods (beans, onions, celery, carrots, raisins, bananas, apricots, prunes, brussel sprouts, wheat germ, pretzels) Follow lactose free diet (back off milk).  Trial culturelle or phillips colon health probiotics for possible IBS. Look at handouts provided today.  Let me know how you do with this.   Follow up plan: Return in about 6 months (around 09/21/2024) for annual exam, prior fasting for blood work.  Anton Blas, MD

## 2024-03-23 NOTE — Assessment & Plan Note (Signed)
 Chronic, great control only on januvia  25mg  daily - continue.  Avoid metformin in CKD. Diabetes complicated by CKD, CHF, CAD, HLD and HTN

## 2024-03-23 NOTE — Patient Instructions (Addendum)
 Prevnar-20 final pneumonia shot today.  You can check with pharmacy on RSV shot Increase water  and fiber in the diet.  Exclude gas producing foods (beans, onions, celery, carrots, raisins, bananas, apricots, prunes, brussel sprouts, wheat germ, pretzels) Follow lactose free diet (back off milk).  Trial culturelle or phillips colon health probiotics for possible IBS. Look at handouts provided today.  Let me know how you do with this.

## 2024-03-23 NOTE — Assessment & Plan Note (Addendum)
 Chronic issue Describes symptoms of IBS-D + lactose intolerance Rec start probiotic culturelle or phillips colon health - update with effect.  Provided with IBS and Low FODMAP diet handouts.  Reassess at next visit.

## 2024-03-23 NOTE — Assessment & Plan Note (Signed)
Seems euvolemic today.  

## 2024-03-23 NOTE — Assessment & Plan Note (Signed)
 No evidence of ongoing impairment. Will resolve problem.

## 2024-03-24 NOTE — Assessment & Plan Note (Signed)
 Continue q6 month Prolia  shots.

## 2024-05-03 LAB — OPHTHALMOLOGY REPORT-SCANNED

## 2024-05-19 ENCOUNTER — Other Ambulatory Visit: Payer: Self-pay | Admitting: Cardiology

## 2024-08-29 ENCOUNTER — Ambulatory Visit

## 2024-09-08 ENCOUNTER — Ambulatory Visit

## 2024-09-20 ENCOUNTER — Other Ambulatory Visit

## 2024-09-27 ENCOUNTER — Encounter: Admitting: Family Medicine
